# Patient Record
Sex: Female | Born: 1985 | Race: Black or African American | Hispanic: No | Marital: Married
Health system: Southern US, Academic
[De-identification: ages and names within clinical notes are randomized; demographics above are authoritative.]

## PROBLEM LIST (undated history)

## (undated) ENCOUNTER — Inpatient Hospital Stay (HOSPITAL_COMMUNITY): Payer: Self-pay

## (undated) ENCOUNTER — Encounter

## (undated) ENCOUNTER — Ambulatory Visit

## (undated) ENCOUNTER — Telehealth

## (undated) ENCOUNTER — Encounter: Attending: Adult Health | Primary: Adult Health

## (undated) ENCOUNTER — Encounter: Attending: Anesthesiology | Primary: Anesthesiology

## (undated) ENCOUNTER — Ambulatory Visit: Payer: BLUE CROSS/BLUE SHIELD

## (undated) ENCOUNTER — Telehealth: Attending: Neurology | Primary: Neurology

## (undated) ENCOUNTER — Telehealth: Attending: Maternal & Fetal Medicine | Primary: Maternal & Fetal Medicine

## (undated) ENCOUNTER — Encounter: Payer: BLUE CROSS/BLUE SHIELD | Attending: Obstetrics & Gynecology | Primary: Obstetrics & Gynecology

## (undated) ENCOUNTER — Ambulatory Visit: Payer: Medicaid (Managed Care)

## (undated) ENCOUNTER — Encounter: Attending: Maternal & Fetal Medicine | Primary: Maternal & Fetal Medicine

## (undated) ENCOUNTER — Encounter: Attending: Obstetrics & Gynecology | Primary: Obstetrics & Gynecology

## (undated) ENCOUNTER — Encounter: Attending: Medical | Primary: Medical

## (undated) ENCOUNTER — Telehealth: Attending: Clinical | Primary: Clinical

## (undated) ENCOUNTER — Telehealth: Attending: Obstetrics & Gynecology | Primary: Obstetrics & Gynecology

## (undated) ENCOUNTER — Encounter: Attending: Clinical | Primary: Clinical

## (undated) ENCOUNTER — Encounter: Payer: BLUE CROSS/BLUE SHIELD | Attending: Obstetrics | Primary: Obstetrics

## (undated) ENCOUNTER — Telehealth
Attending: Student in an Organized Health Care Education/Training Program | Primary: Student in an Organized Health Care Education/Training Program

## (undated) ENCOUNTER — Ambulatory Visit: Payer: BLUE CROSS/BLUE SHIELD | Attending: Obstetrics & Gynecology | Primary: Obstetrics & Gynecology

## (undated) ENCOUNTER — Inpatient Hospital Stay

## (undated) ENCOUNTER — Encounter
Payer: BLUE CROSS/BLUE SHIELD | Attending: Student in an Organized Health Care Education/Training Program | Primary: Student in an Organized Health Care Education/Training Program

## (undated) ENCOUNTER — Ambulatory Visit: Attending: Obstetrics & Gynecology | Primary: Obstetrics & Gynecology

## (undated) ENCOUNTER — Ambulatory Visit: Attending: Pharmacist | Primary: Pharmacist

## (undated) ENCOUNTER — Encounter: Payer: Medicaid (Managed Care) | Attending: Infectious Disease | Primary: Infectious Disease

## (undated) ENCOUNTER — Encounter: Attending: Infectious Disease | Primary: Infectious Disease

## (undated) ENCOUNTER — Encounter
Attending: Student in an Organized Health Care Education/Training Program | Primary: Student in an Organized Health Care Education/Training Program

## (undated) ENCOUNTER — Encounter: Attending: MS" | Primary: MS"

## (undated) DIAGNOSIS — A749 Chlamydial infection, unspecified: Secondary | ICD-10-CM

## (undated) DIAGNOSIS — G43909 Migraine, unspecified, not intractable, without status migrainosus: Secondary | ICD-10-CM

## (undated) DIAGNOSIS — H547 Unspecified visual loss: Secondary | ICD-10-CM

## (undated) DIAGNOSIS — R413 Other amnesia: Secondary | ICD-10-CM

## (undated) DIAGNOSIS — R599 Enlarged lymph nodes, unspecified: Secondary | ICD-10-CM

## (undated) DIAGNOSIS — G932 Benign intracranial hypertension: Secondary | ICD-10-CM

## (undated) DIAGNOSIS — C8590 Non-Hodgkin lymphoma, unspecified, unspecified site: Secondary | ICD-10-CM

## (undated) DIAGNOSIS — B20 Human immunodeficiency virus [HIV] disease: Secondary | ICD-10-CM

## (undated) DIAGNOSIS — Z21 Asymptomatic human immunodeficiency virus [HIV] infection status: Secondary | ICD-10-CM

## (undated) HISTORY — PX: NO PAST SURGERIES: SHX2092

## (undated) HISTORY — PX: DILATION AND CURETTAGE OF UTERUS: SHX78

## (undated) HISTORY — DX: Migraine, unspecified, not intractable, without status migrainosus: G43.909

## (undated) HISTORY — DX: Other amnesia: R41.3

## (undated) HISTORY — PX: OTHER SURGICAL HISTORY: SHX169

## (undated) HISTORY — PX: UPPER GI ENDOSCOPY: SHX6162

## (undated) HISTORY — PX: LUMBAR PUNCTURE: SHX1985

## (undated) HISTORY — PX: COLONOSCOPY: SHX174

## (undated) HISTORY — DX: Unspecified visual loss: H54.7

---
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
## 2011-01-28 ENCOUNTER — Emergency Department: Payer: Self-pay | Admitting: Emergency Medicine

## 2011-10-23 ENCOUNTER — Emergency Department: Payer: Self-pay | Admitting: Internal Medicine

## 2011-11-06 ENCOUNTER — Emergency Department: Payer: Self-pay | Admitting: Unknown Physician Specialty

## 2011-11-06 LAB — CBC
HCT: 35.7 % (ref 35.0–47.0)
HGB: 11.7 g/dL — ABNORMAL LOW (ref 12.0–16.0)
MCH: 27.5 pg (ref 26.0–34.0)
MCV: 84 fL (ref 80–100)
RBC: 4.27 10*6/uL (ref 3.80–5.20)

## 2011-11-06 LAB — HCG, QUANTITATIVE, PREGNANCY: Beta Hcg, Quant.: 1242 m[IU]/mL — ABNORMAL HIGH

## 2011-11-06 LAB — URINALYSIS, COMPLETE
Bilirubin,UR: NEGATIVE
Glucose,UR: NEGATIVE mg/dL (ref 0–75)
Nitrite: NEGATIVE
RBC,UR: 27 /HPF (ref 0–5)
Specific Gravity: 1.03 (ref 1.003–1.030)
Squamous Epithelial: 7
WBC UR: 37 /HPF (ref 0–5)

## 2011-11-06 LAB — WET PREP, GENITAL

## 2011-11-12 ENCOUNTER — Emergency Department: Payer: Self-pay | Admitting: Emergency Medicine

## 2011-11-12 LAB — URINALYSIS, COMPLETE
Bacteria: NONE SEEN
Bilirubin,UR: NEGATIVE
Glucose,UR: NEGATIVE mg/dL (ref 0–75)
Ketone: NEGATIVE
Leukocyte Esterase: NEGATIVE
Specific Gravity: 1.024 (ref 1.003–1.030)
Squamous Epithelial: 2
WBC UR: 1 /HPF (ref 0–5)

## 2011-11-12 LAB — HCG, QUANTITATIVE, PREGNANCY: Beta Hcg, Quant.: 769 m[IU]/mL — ABNORMAL HIGH

## 2011-11-13 ENCOUNTER — Emergency Department: Payer: Self-pay | Admitting: Emergency Medicine

## 2011-11-13 LAB — CBC
HGB: 12.1 g/dL (ref 12.0–16.0)
MCH: 27.4 pg (ref 26.0–34.0)
MCHC: 32.8 g/dL (ref 32.0–36.0)
Platelet: 306 10*3/uL (ref 150–440)
RBC: 4.43 10*6/uL (ref 3.80–5.20)
RDW: 15.3 % — ABNORMAL HIGH (ref 11.5–14.5)

## 2011-11-13 LAB — HCG, QUANTITATIVE, PREGNANCY: Beta Hcg, Quant.: 537 m[IU]/mL — ABNORMAL HIGH

## 2011-11-16 ENCOUNTER — Emergency Department: Payer: Self-pay | Admitting: Unknown Physician Specialty

## 2011-11-16 LAB — COMPREHENSIVE METABOLIC PANEL
Alkaline Phosphatase: 46 U/L — ABNORMAL LOW (ref 50–136)
Anion Gap: 9 (ref 7–16)
BUN: 10 mg/dL (ref 7–18)
Bilirubin,Total: 0.1 mg/dL — ABNORMAL LOW (ref 0.2–1.0)
Calcium, Total: 8.6 mg/dL (ref 8.5–10.1)
Co2: 28 mmol/L (ref 21–32)
EGFR (Non-African Amer.): >60
Glucose: 74 mg/dL (ref 65–99)
Osmolality: 285 (ref 275–301)
Potassium: 3.9 mmol/L (ref 3.5–5.1)
SGPT (ALT): 17 U/L
Sodium: 144 mmol/L (ref 136–145)
Total Protein: 7.2 g/dL (ref 6.4–8.2)

## 2011-11-16 LAB — CBC
HCT: 34.2 % — ABNORMAL LOW (ref 35.0–47.0)
HGB: 11.2 g/dL — ABNORMAL LOW (ref 12.0–16.0)
MCH: 27.4 pg (ref 26.0–34.0)
MCHC: 32.7 g/dL (ref 32.0–36.0)
MCV: 84 fL (ref 80–100)
RDW: 15 % — ABNORMAL HIGH (ref 11.5–14.5)

## 2011-11-16 LAB — URINALYSIS, COMPLETE
Bilirubin,UR: NEGATIVE
Glucose,UR: NEGATIVE mg/dL (ref 0–75)
Leukocyte Esterase: NEGATIVE
RBC,UR: 1 /HPF (ref 0–5)
Squamous Epithelial: 11
WBC UR: 3 /HPF (ref 0–5)

## 2011-11-25 ENCOUNTER — Emergency Department: Payer: Self-pay | Admitting: Emergency Medicine

## 2011-11-25 LAB — URINALYSIS, COMPLETE
Bacteria: NONE SEEN
Glucose,UR: NEGATIVE mg/dL (ref 0–75)
Leukocyte Esterase: NEGATIVE
Nitrite: NEGATIVE
Ph: 6 (ref 4.5–8.0)
Protein: NEGATIVE
RBC,UR: 2 /HPF (ref 0–5)
WBC UR: <1 /HPF (ref 0–5)

## 2011-11-25 LAB — COMPREHENSIVE METABOLIC PANEL
Albumin: 3.9 g/dL (ref 3.4–5.0)
Alkaline Phosphatase: 48 U/L — ABNORMAL LOW (ref 50–136)
Anion Gap: 8 (ref 7–16)
BUN: 17 mg/dL (ref 7–18)
Calcium, Total: 9 mg/dL (ref 8.5–10.1)
Chloride: 105 mmol/L (ref 98–107)
EGFR (Non-African Amer.): >60
Glucose: 87 mg/dL (ref 65–99)
Potassium: 4 mmol/L (ref 3.5–5.1)
SGOT(AST): 17 U/L (ref 15–37)
Sodium: 141 mmol/L (ref 136–145)
Total Protein: 8.3 g/dL — ABNORMAL HIGH (ref 6.4–8.2)

## 2011-11-25 LAB — CBC
HCT: 37.1 % (ref 35.0–47.0)
HGB: 12 g/dL (ref 12.0–16.0)
RDW: 15.5 % — ABNORMAL HIGH (ref 11.5–14.5)
WBC: 5.6 10*3/uL (ref 3.6–11.0)

## 2011-11-25 LAB — HCG, QUANTITATIVE, PREGNANCY: Beta Hcg, Quant.: 108 m[IU]/mL — ABNORMAL HIGH

## 2012-02-15 ENCOUNTER — Emergency Department: Payer: Self-pay | Admitting: Emergency Medicine

## 2012-09-02 ENCOUNTER — Emergency Department: Payer: Self-pay | Admitting: Emergency Medicine

## 2012-09-02 LAB — URINALYSIS, COMPLETE
Bilirubin,UR: NEGATIVE
Blood: NEGATIVE
Glucose,UR: NEGATIVE mg/dL (ref 0–75)
Ketone: NEGATIVE
Leukocyte Esterase: NEGATIVE
Ph: 8 (ref 4.5–8.0)
Protein: NEGATIVE
RBC,UR: 2 /HPF (ref 0–5)
Specific Gravity: 1.026 (ref 1.003–1.030)
Squamous Epithelial: 9

## 2012-09-02 LAB — WET PREP, GENITAL

## 2012-09-02 LAB — PREGNANCY, URINE: Pregnancy Test, Urine: NEGATIVE m[IU]/mL

## 2012-09-18 ENCOUNTER — Emergency Department: Payer: Self-pay | Admitting: Emergency Medicine

## 2012-09-19 LAB — CBC
HCT: 37.5 % (ref 35.0–47.0)
HGB: 13.2 g/dL (ref 12.0–16.0)
MCHC: 35.1 g/dL (ref 32.0–36.0)
RBC: 4.57 10*6/uL (ref 3.80–5.20)

## 2012-09-19 LAB — COMPREHENSIVE METABOLIC PANEL
Albumin: 4 g/dL (ref 3.4–5.0)
Anion Gap: 5 — ABNORMAL LOW (ref 7–16)
BUN: 11 mg/dL (ref 7–18)
Bilirubin,Total: 0.2 mg/dL (ref 0.2–1.0)
Calcium, Total: 9 mg/dL (ref 8.5–10.1)
Chloride: 109 mmol/L — ABNORMAL HIGH (ref 98–107)
Creatinine: 0.89 mg/dL (ref 0.60–1.30)
EGFR (African American): >60
Osmolality: 281 (ref 275–301)
Potassium: 4 mmol/L (ref 3.5–5.1)
SGOT(AST): 28 U/L (ref 15–37)
Sodium: 141 mmol/L (ref 136–145)
Total Protein: 8.1 g/dL (ref 6.4–8.2)

## 2012-09-19 LAB — URINALYSIS, COMPLETE
Blood: NEGATIVE
Glucose,UR: NEGATIVE mg/dL (ref 0–75)
Ketone: NEGATIVE
Protein: NEGATIVE
RBC,UR: 3 /HPF (ref 0–5)
Specific Gravity: 1.018 (ref 1.003–1.030)
Squamous Epithelial: 1

## 2012-09-19 LAB — WET PREP, GENITAL

## 2012-09-19 LAB — PREGNANCY, URINE: Pregnancy Test, Urine: NEGATIVE m[IU]/mL

## 2012-10-19 ENCOUNTER — Emergency Department: Payer: Self-pay | Admitting: Emergency Medicine

## 2012-10-19 LAB — URINALYSIS, COMPLETE
Bilirubin,UR: NEGATIVE
Blood: NEGATIVE
Ketone: NEGATIVE
Nitrite: NEGATIVE
Protein: NEGATIVE
Specific Gravity: 1.023 (ref 1.003–1.030)
Squamous Epithelial: 1

## 2012-10-19 LAB — COMPREHENSIVE METABOLIC PANEL
Alkaline Phosphatase: 78 U/L (ref 50–136)
Bilirubin,Total: 0.1 mg/dL — ABNORMAL LOW (ref 0.2–1.0)
Calcium, Total: 8.9 mg/dL (ref 8.5–10.1)
Chloride: 108 mmol/L — ABNORMAL HIGH (ref 98–107)
EGFR (African American): >60
Osmolality: 281 (ref 275–301)
Potassium: 3.7 mmol/L (ref 3.5–5.1)
SGOT(AST): 26 U/L (ref 15–37)
SGPT (ALT): 23 U/L (ref 12–78)
Sodium: 142 mmol/L (ref 136–145)
Total Protein: 8.7 g/dL — ABNORMAL HIGH (ref 6.4–8.2)

## 2012-10-19 LAB — CBC
MCH: 25.4 pg — ABNORMAL LOW (ref 26.0–34.0)
MCV: 83 fL (ref 80–100)
Platelet: 271 10*3/uL (ref 150–440)
WBC: 6.1 10*3/uL (ref 3.6–11.0)

## 2012-10-19 LAB — WET PREP, GENITAL

## 2012-10-19 LAB — PREGNANCY, URINE: Pregnancy Test, Urine: NEGATIVE m[IU]/mL

## 2012-10-24 ENCOUNTER — Emergency Department: Payer: Self-pay | Admitting: Emergency Medicine

## 2012-10-24 LAB — URINALYSIS, COMPLETE
Blood: NEGATIVE
Glucose,UR: NEGATIVE mg/dL (ref 0–75)
Ketone: NEGATIVE
Leukocyte Esterase: NEGATIVE
Nitrite: NEGATIVE
Protein: NEGATIVE
RBC,UR: 1 /HPF (ref 0–5)

## 2012-10-24 LAB — COMPREHENSIVE METABOLIC PANEL
Albumin: 4.1 g/dL (ref 3.4–5.0)
Calcium, Total: 9.3 mg/dL (ref 8.5–10.1)
Co2: 26 mmol/L (ref 21–32)
EGFR (African American): >60
Glucose: 87 mg/dL (ref 65–99)
Osmolality: 282 (ref 275–301)
Sodium: 142 mmol/L (ref 136–145)

## 2012-10-24 LAB — CBC
HCT: 40.1 % (ref 35.0–47.0)
HGB: 13.2 g/dL (ref 12.0–16.0)
MCH: 27.2 pg (ref 26.0–34.0)
MCHC: 32.9 g/dL (ref 32.0–36.0)
MCV: 83 fL (ref 80–100)
RDW: 14.6 % — ABNORMAL HIGH (ref 11.5–14.5)
WBC: 6.6 10*3/uL (ref 3.6–11.0)

## 2012-10-24 LAB — ACETAMINOPHEN LEVEL: Acetaminophen: 16 ug/mL

## 2012-10-24 LAB — SALICYLATE LEVEL: Salicylates, Serum: 16 mg/dL — ABNORMAL HIGH

## 2012-10-31 ENCOUNTER — Telehealth: Payer: Self-pay

## 2012-10-31 NOTE — Telephone Encounter (Signed)
Pt was referred by [facility].  I attempted to speak with her via phone on 10-15-12 and she refused my call at the time saying she would call me back later. She never returned call.  Today I called and she answered the phone.  She is stating the test performed through [facility] are falsely positive. She states she went to [facility] and re tested for HIV and was told her test was negative.    [facility] has tested this patient twice and each time her results were positive, she states she was taking heavy antibiotics at the time and her "mothers doctor" told her she should not have been tested while taking these medications.  I explained the two test were confirmed and she is HIV positive and for this reason a referral was made to Infectious Disease physicians.  She states she only wants a Viral load test . I explained she would need to come for a visit with me which would include labs and she would also need to qualify for financial services . She repeats she only wants a viral load test.    Pt was informed I would contact [facility]  [place] and share this information.    I spoke with Blima Singer and explained the patient is not willing to come for intake and thinks the test done by [facility] are falsely positive.  At this point she may need a Paramedic referral. It seems she may be in denial.  We can use our [facility] or I can make a referral to [facility] [employer].

## 2012-11-16 ENCOUNTER — Telehealth: Payer: Self-pay

## 2012-11-16 NOTE — Telephone Encounter (Signed)
Amber Key, [facility] Bridge Counselor contacted patient. She is willing to come for appointment.  Pt was scheduled and advised to call t reschedule if needed.    Laurell Josephs, RN

## 2012-11-27 ENCOUNTER — Ambulatory Visit: Payer: Self-pay

## 2012-12-11 ENCOUNTER — Emergency Department: Payer: Self-pay | Admitting: Emergency Medicine

## 2012-12-11 LAB — CBC
HCT: 40.7 % (ref 35.0–47.0)
HGB: 13.4 g/dL (ref 12.0–16.0)
MCH: 26.7 pg (ref 26.0–34.0)
MCV: 81 fL (ref 80–100)
RBC: 5 10*6/uL (ref 3.80–5.20)
WBC: 7.4 10*3/uL (ref 3.6–11.0)

## 2012-12-11 LAB — COMPREHENSIVE METABOLIC PANEL
Albumin: 4 g/dL (ref 3.4–5.0)
Alkaline Phosphatase: 78 U/L (ref 50–136)
Bilirubin,Total: 0.3 mg/dL (ref 0.2–1.0)
Calcium, Total: 8.7 mg/dL (ref 8.5–10.1)
Chloride: 105 mmol/L (ref 98–107)
Creatinine: 0.64 mg/dL (ref 0.60–1.30)
EGFR (African American): >60
Glucose: 88 mg/dL (ref 65–99)
Osmolality: 275 (ref 275–301)
Potassium: 3.8 mmol/L (ref 3.5–5.1)
SGPT (ALT): 30 U/L (ref 12–78)
Sodium: 138 mmol/L (ref 136–145)

## 2012-12-11 LAB — URINALYSIS, COMPLETE
Bilirubin,UR: NEGATIVE
Ketone: NEGATIVE
Protein: NEGATIVE
WBC UR: 3 /HPF (ref 0–5)

## 2013-02-20 ENCOUNTER — Encounter: Payer: Self-pay | Admitting: *Deleted

## 2013-02-20 NOTE — Progress Notes (Signed)
Patient information given to [facility] Case Managers to assist with getting patient into care.  

## 2013-05-21 ENCOUNTER — Emergency Department: Payer: Self-pay | Admitting: Emergency Medicine

## 2013-05-26 ENCOUNTER — Emergency Department: Payer: Self-pay | Admitting: Emergency Medicine

## 2013-07-17 ENCOUNTER — Emergency Department: Payer: Self-pay | Admitting: Internal Medicine

## 2013-07-19 LAB — BETA STREP CULTURE(ARMC)

## 2013-07-22 ENCOUNTER — Emergency Department: Payer: Self-pay | Admitting: Emergency Medicine

## 2013-07-22 LAB — MONONUCLEOSIS SCREEN: Mono Test: NEGATIVE

## 2013-09-09 ENCOUNTER — Emergency Department: Payer: Self-pay | Admitting: Emergency Medicine

## 2013-09-09 LAB — CBC WITH DIFFERENTIAL/PLATELET
Eosinophil #: 0.2 10*3/uL (ref 0.0–0.7)
HCT: 36.1 % (ref 35.0–47.0)
HGB: 11.9 g/dL — ABNORMAL LOW (ref 12.0–16.0)
Lymphocyte #: 1.6 10*3/uL (ref 1.0–3.6)
MCH: 27 pg (ref 26.0–34.0)
Monocyte #: 0.3 x10 3/mm (ref 0.2–0.9)
Neutrophil #: 1.4 10*3/uL (ref 1.4–6.5)
RBC: 4.42 10*6/uL (ref 3.80–5.20)
RDW: 14.6 % — ABNORMAL HIGH (ref 11.5–14.5)
WBC: 3.5 10*3/uL — ABNORMAL LOW (ref 3.6–11.0)

## 2013-09-09 LAB — COMPREHENSIVE METABOLIC PANEL
Alkaline Phosphatase: 64 U/L
BUN: 10 mg/dL (ref 7–18)
Calcium, Total: 8.9 mg/dL (ref 8.5–10.1)
Chloride: 107 mmol/L (ref 98–107)
EGFR (African American): >60
SGPT (ALT): 24 U/L (ref 12–78)
Sodium: 138 mmol/L (ref 136–145)
Total Protein: 8.5 g/dL — ABNORMAL HIGH (ref 6.4–8.2)

## 2013-09-11 ENCOUNTER — Emergency Department: Payer: Self-pay | Admitting: Emergency Medicine

## 2013-09-11 LAB — COMPREHENSIVE METABOLIC PANEL
Albumin: 3.8 g/dL (ref 3.4–5.0)
Alkaline Phosphatase: 65 U/L
Anion Gap: 8 (ref 7–16)
BUN: 11 mg/dL (ref 7–18)
Calcium, Total: 9.3 mg/dL (ref 8.5–10.1)
Co2: 26 mmol/L (ref 21–32)
Creatinine: 0.74 mg/dL (ref 0.60–1.30)
EGFR (Non-African Amer.): >60
Osmolality: 276 (ref 275–301)
Potassium: 3.6 mmol/L (ref 3.5–5.1)
SGPT (ALT): 26 U/L (ref 12–78)
Total Protein: 8.6 g/dL — ABNORMAL HIGH (ref 6.4–8.2)

## 2013-09-11 LAB — URINALYSIS, COMPLETE
Bacteria: NONE SEEN
Bilirubin,UR: NEGATIVE
Glucose,UR: NEGATIVE mg/dL (ref 0–75)
Leukocyte Esterase: NEGATIVE
Protein: NEGATIVE
RBC,UR: 2 /HPF (ref 0–5)
Specific Gravity: 1.02 (ref 1.003–1.030)
Squamous Epithelial: 2

## 2013-09-11 LAB — CBC WITH DIFFERENTIAL/PLATELET
Basophil #: 0 10*3/uL (ref 0.0–0.1)
Eosinophil #: 0.1 10*3/uL (ref 0.0–0.7)
HCT: 35.9 % (ref 35.0–47.0)
HGB: 11.9 g/dL — ABNORMAL LOW (ref 12.0–16.0)
Lymphocyte #: 1.8 10*3/uL (ref 1.0–3.6)
Lymphocyte %: 36.3 %
MCH: 27.3 pg (ref 26.0–34.0)
MCV: 82 fL (ref 80–100)
Monocyte #: 0.6 x10 3/mm (ref 0.2–0.9)
Neutrophil #: 2.5 10*3/uL (ref 1.4–6.5)
Platelet: 236 10*3/uL (ref 150–440)
RBC: 4.38 10*6/uL (ref 3.80–5.20)
RDW: 14.7 % — ABNORMAL HIGH (ref 11.5–14.5)
WBC: 5.1 10*3/uL (ref 3.6–11.0)

## 2013-11-16 ENCOUNTER — Emergency Department: Payer: Self-pay | Admitting: Emergency Medicine

## 2013-12-13 DIAGNOSIS — G932 Benign intracranial hypertension: Secondary | ICD-10-CM | POA: Insufficient documentation

## 2013-12-13 DIAGNOSIS — G43909 Migraine, unspecified, not intractable, without status migrainosus: Secondary | ICD-10-CM | POA: Insufficient documentation

## 2014-02-09 ENCOUNTER — Emergency Department: Payer: Self-pay | Admitting: Emergency Medicine

## 2014-02-09 LAB — CBC
HCT: 37.9 % (ref 35.0–47.0)
HGB: 12.4 g/dL (ref 12.0–16.0)
MCH: 27.3 pg (ref 26.0–34.0)
MCHC: 32.6 g/dL (ref 32.0–36.0)
MCV: 84 fL (ref 80–100)
Platelet: 224 10*3/uL (ref 150–440)
RBC: 4.53 10*6/uL (ref 3.80–5.20)
RDW: 15.3 % — AB (ref 11.5–14.5)
WBC: 5.3 10*3/uL (ref 3.6–11.0)

## 2014-02-09 LAB — BASIC METABOLIC PANEL
Anion Gap: 9 (ref 7–16)
BUN: 12 mg/dL (ref 7–18)
CO2: 18 mmol/L — AB (ref 21–32)
Calcium, Total: 8.8 mg/dL (ref 8.5–10.1)
Chloride: 116 mmol/L — ABNORMAL HIGH (ref 98–107)
Creatinine: 0.88 mg/dL (ref 0.60–1.30)
EGFR (Non-African Amer.): >60
GLUCOSE: 74 mg/dL (ref 65–99)
Osmolality: 283 (ref 275–301)
Potassium: 3.7 mmol/L (ref 3.5–5.1)
Sodium: 143 mmol/L (ref 136–145)

## 2014-02-10 ENCOUNTER — Ambulatory Visit: Payer: Self-pay | Admitting: Oncology

## 2014-02-10 DIAGNOSIS — B20 Human immunodeficiency virus [HIV] disease: Secondary | ICD-10-CM | POA: Insufficient documentation

## 2014-02-10 DIAGNOSIS — Z21 Asymptomatic human immunodeficiency virus [HIV] infection status: Secondary | ICD-10-CM | POA: Insufficient documentation

## 2014-02-10 LAB — CBC CANCER CENTER
Basophil #: 0 x10 3/mm (ref 0.0–0.1)
Basophil %: 0.6 %
EOS PCT: 9.2 %
Eosinophil #: 0.4 x10 3/mm (ref 0.0–0.7)
HCT: 35.7 % (ref 35.0–47.0)
HGB: 11.7 g/dL — AB (ref 12.0–16.0)
Lymphocyte #: 1.9 x10 3/mm (ref 1.0–3.6)
Lymphocyte %: 38.1 %
MCH: 26.9 pg (ref 26.0–34.0)
MCHC: 32.7 g/dL (ref 32.0–36.0)
MCV: 82 fL (ref 80–100)
MONO ABS: 0.5 x10 3/mm (ref 0.2–0.9)
Monocyte %: 10.5 %
NEUTROS ABS: 2 x10 3/mm (ref 1.4–6.5)
NEUTROS PCT: 41.6 %
PLATELETS: 233 x10 3/mm (ref 150–440)
RBC: 4.34 10*6/uL (ref 3.80–5.20)
RDW: 15.3 % — AB (ref 11.5–14.5)
WBC: 4.9 x10 3/mm (ref 3.6–11.0)

## 2014-02-10 LAB — LACTATE DEHYDROGENASE: LDH: 162 U/L (ref 81–246)

## 2014-02-11 DIAGNOSIS — R599 Enlarged lymph nodes, unspecified: Secondary | ICD-10-CM | POA: Insufficient documentation

## 2014-02-11 DIAGNOSIS — R591 Generalized enlarged lymph nodes: Secondary | ICD-10-CM | POA: Insufficient documentation

## 2014-03-10 ENCOUNTER — Ambulatory Visit: Payer: Self-pay | Admitting: Oncology

## 2014-04-27 ENCOUNTER — Emergency Department: Payer: Self-pay | Admitting: Internal Medicine

## 2014-06-19 ENCOUNTER — Emergency Department: Payer: Self-pay | Admitting: Emergency Medicine

## 2014-06-19 LAB — COMPREHENSIVE METABOLIC PANEL
ALK PHOS: 69 U/L
ANION GAP: 6 — AB (ref 7–16)
AST: 25 U/L (ref 15–37)
Albumin: 3.7 g/dL (ref 3.4–5.0)
BILIRUBIN TOTAL: 0.2 mg/dL (ref 0.2–1.0)
BUN: 12 mg/dL (ref 7–18)
CALCIUM: 9.2 mg/dL (ref 8.5–10.1)
CREATININE: 0.83 mg/dL (ref 0.60–1.30)
Chloride: 107 mmol/L (ref 98–107)
Co2: 26 mmol/L (ref 21–32)
EGFR (African American): >60
EGFR (Non-African Amer.): >60
Glucose: 90 mg/dL (ref 65–99)
Osmolality: 277 (ref 275–301)
Potassium: 3.7 mmol/L (ref 3.5–5.1)
SGPT (ALT): 18 U/L
Sodium: 139 mmol/L (ref 136–145)
Total Protein: 8.4 g/dL — ABNORMAL HIGH (ref 6.4–8.2)

## 2014-06-19 LAB — URINALYSIS, COMPLETE
BACTERIA: NONE SEEN
BILIRUBIN, UR: NEGATIVE
BLOOD: NEGATIVE
Glucose,UR: NEGATIVE mg/dL (ref 0–75)
Ketone: NEGATIVE
LEUKOCYTE ESTERASE: NEGATIVE
Nitrite: NEGATIVE
PROTEIN: NEGATIVE
Ph: 5 (ref 4.5–8.0)
RBC, UR: NONE SEEN /HPF (ref 0–5)
Specific Gravity: 1.028 (ref 1.003–1.030)
Squamous Epithelial: 8

## 2014-06-19 LAB — CBC WITH DIFFERENTIAL/PLATELET
BASOS ABS: 0 10*3/uL (ref 0.0–0.1)
Basophil %: 0.4 %
Eosinophil #: 0.1 10*3/uL (ref 0.0–0.7)
Eosinophil %: 2 %
HCT: 40.4 % (ref 35.0–47.0)
HGB: 13.1 g/dL (ref 12.0–16.0)
LYMPHS ABS: 2.2 10*3/uL (ref 1.0–3.6)
Lymphocyte %: 39.3 %
MCH: 28.2 pg (ref 26.0–34.0)
MCHC: 32.5 g/dL (ref 32.0–36.0)
MCV: 87 fL (ref 80–100)
MONO ABS: 0.4 x10 3/mm (ref 0.2–0.9)
Monocyte %: 7.7 %
NEUTROS ABS: 2.8 10*3/uL (ref 1.4–6.5)
Neutrophil %: 50.6 %
PLATELETS: 258 10*3/uL (ref 150–440)
RBC: 4.65 10*6/uL (ref 3.80–5.20)
RDW: 16 % — ABNORMAL HIGH (ref 11.5–14.5)
WBC: 5.5 10*3/uL (ref 3.6–11.0)

## 2014-06-26 DIAGNOSIS — R928 Other abnormal and inconclusive findings on diagnostic imaging of breast: Secondary | ICD-10-CM | POA: Insufficient documentation

## 2014-07-10 ENCOUNTER — Emergency Department: Payer: Self-pay | Admitting: Emergency Medicine

## 2014-07-10 LAB — CBC
HCT: 36 % (ref 35.0–47.0)
HGB: 11.7 g/dL — ABNORMAL LOW (ref 12.0–16.0)
MCH: 28.1 pg (ref 26.0–34.0)
MCHC: 32.5 g/dL (ref 32.0–36.0)
MCV: 86 fL (ref 80–100)
PLATELETS: 253 10*3/uL (ref 150–440)
RBC: 4.17 10*6/uL (ref 3.80–5.20)
RDW: 15.7 % — AB (ref 11.5–14.5)
WBC: 8.1 10*3/uL (ref 3.6–11.0)

## 2014-07-10 LAB — COMPREHENSIVE METABOLIC PANEL
ALT: 22 U/L
Albumin: 4 g/dL (ref 3.4–5.0)
Alkaline Phosphatase: 70 U/L
Anion Gap: 9 (ref 7–16)
BUN: 11 mg/dL (ref 7–18)
Bilirubin,Total: 0.2 mg/dL (ref 0.2–1.0)
Calcium, Total: 8.8 mg/dL (ref 8.5–10.1)
Chloride: 108 mmol/L — ABNORMAL HIGH (ref 98–107)
Co2: 25 mmol/L (ref 21–32)
Creatinine: 0.99 mg/dL (ref 0.60–1.30)
EGFR (African American): >60
Glucose: 96 mg/dL (ref 65–99)
OSMOLALITY: 282 (ref 275–301)
Potassium: 3.2 mmol/L — ABNORMAL LOW (ref 3.5–5.1)
SGOT(AST): 19 U/L (ref 15–37)
SODIUM: 142 mmol/L (ref 136–145)
Total Protein: 8.2 g/dL (ref 6.4–8.2)

## 2014-07-10 LAB — LIPASE, BLOOD
LIPASE: 115 U/L (ref 73–393)
LIPASE: 103 U/L (ref 73–393)

## 2014-07-10 LAB — URINALYSIS, COMPLETE
Bilirubin,UR: NEGATIVE
Blood: NEGATIVE
Glucose,UR: NEGATIVE mg/dL (ref 0–75)
Leukocyte Esterase: NEGATIVE
NITRITE: NEGATIVE
PH: 5 (ref 4.5–8.0)
PROTEIN: NEGATIVE
RBC,UR: 1 /HPF (ref 0–5)
Specific Gravity: 1.035 (ref 1.003–1.030)
Squamous Epithelial: 6
WBC UR: 1 /HPF (ref 0–5)

## 2014-07-10 LAB — DRUG SCREEN, URINE
AMPHETAMINES, UR SCREEN: NEGATIVE (ref ?–1000)
BENZODIAZEPINE, UR SCRN: NEGATIVE (ref ?–200)
Barbiturates, Ur Screen: NEGATIVE (ref ?–200)
Cannabinoid 50 Ng, Ur ~~LOC~~: POSITIVE (ref ?–50)
Cocaine Metabolite,Ur ~~LOC~~: NEGATIVE (ref ?–300)
MDMA (Ecstasy)Ur Screen: NEGATIVE (ref ?–500)
METHADONE, UR SCREEN: NEGATIVE (ref ?–300)
Opiate, Ur Screen: NEGATIVE (ref ?–300)
PHENCYCLIDINE (PCP) UR S: NEGATIVE (ref ?–25)
Tricyclic, Ur Screen: NEGATIVE (ref ?–1000)

## 2014-07-10 LAB — PREGNANCY, URINE: Pregnancy Test, Urine: NEGATIVE m[IU]/mL

## 2014-07-10 LAB — ACETAMINOPHEN LEVEL
ACETAMINOPHEN: 12 ug/mL
ACETAMINOPHEN: 29 ug/mL
Acetaminophen: 16 ug/mL

## 2014-07-10 LAB — ETHANOL: Ethanol: <3 mg/dL

## 2014-07-10 LAB — SALICYLATE LEVEL
SALICYLATES, SERUM: 1.8 mg/dL
Salicylates, Serum: <1.7 mg/dL

## 2014-07-16 ENCOUNTER — Emergency Department: Payer: Self-pay | Admitting: Student

## 2014-07-22 ENCOUNTER — Ambulatory Visit: Payer: Self-pay | Admitting: Family Medicine

## 2014-07-24 ENCOUNTER — Encounter: Payer: Self-pay | Admitting: Podiatry

## 2014-07-24 ENCOUNTER — Ambulatory Visit (INDEPENDENT_AMBULATORY_CARE_PROVIDER_SITE_OTHER): Payer: Medicaid Other | Admitting: Podiatry

## 2014-07-24 ENCOUNTER — Ambulatory Visit (INDEPENDENT_AMBULATORY_CARE_PROVIDER_SITE_OTHER): Payer: Medicaid Other

## 2014-07-24 VITALS — BP 104/72 | HR 83 | Resp 16 | Ht 59.0 in | Wt 171.0 lb

## 2014-07-24 DIAGNOSIS — M7750 Other enthesopathy of unspecified foot: Secondary | ICD-10-CM

## 2014-07-24 DIAGNOSIS — M779 Enthesopathy, unspecified: Secondary | ICD-10-CM

## 2014-07-24 DIAGNOSIS — M792 Neuralgia and neuritis, unspecified: Secondary | ICD-10-CM

## 2014-07-24 DIAGNOSIS — S9031XA Contusion of right foot, initial encounter: Secondary | ICD-10-CM

## 2014-07-24 MED ORDER — DICLOFENAC SODIUM 75 MG PO TBEC: 75.0000 mg | DELAYED_RELEASE_TABLET | Freq: Two times a day (BID) | ORAL | Status: DC

## 2014-07-24 NOTE — Progress Notes (Signed)
   Subjective:    Patient ID: Amber Key, female    DOB: 02/08/1986, 28 y.o.   MRN: [id number]  HPI Comments: Amber Key, 28 year old female, presents the office they with complaints of right foot and ankle pain. She states that she fell down a set of steps last week for which she hurt her ankle. She states that she went to the emergency room and was told she had a sprain. She's been applying a brace to her ankle. She is also taking Motrin and tramadol for pain. She is also been applying ice to the area. She states that she is having pain to the front of her ankle and top of her foot. She states it feels like an electric shock at times. The pain is intermittent. No other complaints at this time.   Foot Pain Associated symptoms include fatigue and headaches.      Review of Systems  Constitutional: Positive for fatigue.       Sweating   HENT: Positive for ear pain.   Musculoskeletal: Positive for back pain.  Neurological: Positive for headaches.  All other systems reviewed and are negative.      Objective:   Physical Exam AAO x3, NAD DP/PT pulses palpable bilaterally, CRT less than 3 seconds Protective sensation intact with Simms Weinstein monofilament, vibratory sensation intact, Achilles tendon reflex intact. Mild edema over the dorsal lateral aspect of the right foot. No overlying erythema or increased warmth. There is no pinpoint bony tenderness or pain with vibratory sensation. No pain along the course of the fibula, tibia, syndesmosis. No tenderness over the course of the lateral or medial ankle ligaments were syndesmosis. Ankle joint range of motion within normal limits and without pain.  No tenderness in the foot including fifth metatarsal base, navicular, foot. Reproduction of symptoms upon palpation of the superficial peroneal nerve. MMT 5/5, ROM WNL No open lesions No calf pain, swelling, warmth.       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 -X-rays were obtained and reviewed with the patient. -Various treatment options discussed including alternatives, risks, complications. -Continue with ankle brace at this time. -Ice to the effected area. -Prescribed Voltaren twice a day. Patient has since discontinued ibuprofen 800 mg. Discussed with her side effects the medication and directed to stop immediately if any are to occur. -Followup in 2 weeks to recheck symptoms or sooner if any problems are to arise. In the meantime call the office with any questions, concerns. Followup with PCP for other issues mentioned in the review of systems

## 2014-08-07 ENCOUNTER — Ambulatory Visit: Payer: Medicaid Other | Admitting: Podiatry

## 2015-01-27 ENCOUNTER — Emergency Department
Admit: 2015-01-27 | Disposition: A | Discharge status: Home / Self Care | Payer: Self-pay | Admitting: Emergency Medicine

## 2015-01-27 LAB — CBC WITH DIFFERENTIAL/PLATELET
BASOS PCT: 0.5 %
Basophil #: 0 10*3/uL (ref 0.0–0.1)
EOS ABS: 0.1 10*3/uL (ref 0.0–0.7)
EOS PCT: 1.9 %
HCT: 41.2 % (ref 35.0–47.0)
HGB: 13.3 g/dL (ref 12.0–16.0)
LYMPHS ABS: 2.1 10*3/uL (ref 1.0–3.6)
Lymphocyte %: 44.1 %
MCH: 29.2 pg (ref 26.0–34.0)
MCHC: 32.3 g/dL (ref 32.0–36.0)
MCV: 90 fL (ref 80–100)
MONO ABS: 0.3 x10 3/mm (ref 0.2–0.9)
Monocyte %: 6.3 %
Neutrophil #: 2.3 10*3/uL (ref 1.4–6.5)
Neutrophil %: 47.2 %
Platelet: 197 10*3/uL (ref 150–440)
RBC: 4.56 10*6/uL (ref 3.80–5.20)
RDW: 15.2 % — ABNORMAL HIGH (ref 11.5–14.5)
WBC: 4.9 10*3/uL (ref 3.6–11.0)

## 2015-01-27 LAB — COMPREHENSIVE METABOLIC PANEL
ALK PHOS: 77 U/L
ALT: 17 U/L
AST: 25 U/L
Albumin: 4.6 g/dL
Anion Gap: 5 — ABNORMAL LOW (ref 7–16)
BILIRUBIN TOTAL: 0.3 mg/dL
BUN: 10 mg/dL
CHLORIDE: 108 mmol/L
CREATININE: 1.01 mg/dL — AB
Calcium, Total: 8.5 mg/dL — ABNORMAL LOW
Co2: 26 mmol/L
EGFR (African American): >60
EGFR (Non-African Amer.): >60
Glucose: 89 mg/dL
Potassium: 3.6 mmol/L
SODIUM: 139 mmol/L
TOTAL PROTEIN: 8.1 g/dL

## 2015-01-27 LAB — URINALYSIS, COMPLETE
BACTERIA: NONE SEEN
BLOOD: NEGATIVE
Bilirubin,UR: NEGATIVE
GLUCOSE, UR: NEGATIVE mg/dL (ref 0–75)
KETONE: NEGATIVE
LEUKOCYTE ESTERASE: NEGATIVE
Nitrite: NEGATIVE
Ph: 6 (ref 4.5–8.0)
Protein: NEGATIVE
Specific Gravity: 1.013 (ref 1.003–1.030)

## 2015-01-27 LAB — PREGNANCY, URINE: Pregnancy Test, Urine: NEGATIVE m[IU]/mL

## 2015-01-31 NOTE — Consult Note (Signed)
PATIENT NAME:  Amber Key, Amber Key MR#:  [id number] DATE OF BIRTH:  01-09-1986  DATE OF CONSULTATION:  07/10/2014  REFERRING PHYSICIAN:   CONSULTING PHYSICIAN:  Gonzella Lex, MD  IDENTIFYING INFORMATION AND REASON FOR CONSULT: This is a 29 year old woman with multiple medical problems, who presented to the hospital after taking an excessive amount of Tylenol.   CHIEF COMPLAINT: "I was not trying to kill myself."   HISTORY OF PRESENT ILLNESS: Information obtained from the patient and the chart. The patient states that she has chronic pain, which has been going on for several months. She is being worked up at [facility] by a gastroenterologist, also a neurologist, also her ID doctor. So far, they have not found any cause. The pain is constant and sharp. She was feeling overwhelmed by it yesterday and took Tylenol. She said she took 5 of them at a time on 3 separate occasions, and then started feeling even more sick to her stomach. Told her fiance, who brought her into the hospital. The patient denies that she stays down or depressed consistently. Says that her medical condition has otherwise been stable. Her appetite is influenced by her abdominal pain. Sleep is adequate. Denies any suicidal ideation. Denies any psychotic symptoms. She tells me she did not know that the Tylenol was a potentially dangerous medicine. She says that she told the nurse when she came in that she took the medicine to make the pain stop, not to make herself stop or to go away.   PAST PSYCHIATRIC HISTORY: Sees a therapist at [facility] through the infectious disease clinic. No history of psychiatric hospitalization. No history of psychiatric medicine. No past suicide attempts.   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 Chronic abdominal pain of unknown etiology.   FAMILY HISTORY: Negative for depression.   SUBSTANCE ABUSE HISTORY: Does not drink, does not abuse drugs. Says she has no history of regular drug or  alcohol abuse. Used to use medical marijuana when she lived in [place], but does not do that anymore.   SOCIAL HISTORY: Lives with her fiance. Has an 42-year-old daughter who is currently visiting a family member in [place], but is scheduled to come home this weekend. Has a good relationship with her fiance and her 3 stepchildren.   CURRENT MEDICATIONS: Based on reconciliation, she takes Imitrex p.r.n. for headache, propranolol 20 mg twice a day, ondansetron 4 mg as needed for nausea, Diamox 500 mg 2 capsules twice a day, Carafate 1 gram 4 times a day. Also takes (Dictation Anomaly) <<medicationsMISSING TEXT>> for the HIV.    ALLERGIES: PEANUTS.   REVIEW OF SYSTEMS: Chronic pain in the abdomen. Mild nausea. Denies feeling depressed, denies suicidal or homicidal ideation. Denies hallucinations. The rest of the full 9 category review of systems is negative.   MENTAL STATUS EXAMINATION: Alert, oriented, and cooperative. Good eye contact. Normal psychomotor activity. Speech normal rate, tone and volume. Affect tearful at first, but reactive and appropriate. Calmed down and became less upset the more we talked. Mood stated as okay. Thoughts are lucid. No evidence of delusions. Denies auditory or visual hallucinations. Denies suicidal or homicidal ideation. Shows good insight and judgment. Alert and oriented x 4. Remembers 3/3 objects immediately and at 3 minutes. Normal intelligence. Normal fund of knowledge.   LABORATORY RESULTS: Most importantly the acetaminophen level never went higher than 29, and then came down, so she never got anywhere near a toxic level that would require treatment.  Lipase normal. Alcohol level negative. Chemistry panel: Only slightly low potassium. Does have a positive screen for marijuana. CBC normal white count, hemoglobin slightly low at 11.7. Urinalysis unremarkable.   VITAL SIGNS: Blood pressure was 104/63, respirations 18, pulse 92, temperature 98.3. The patient appears  to be in no physical distress.   ASSESSMENT: A 29 year old woman took an overdose of Tylenol by accident. Denies that she had any intention of harming herself. Denies having major depression. Denies psychotic symptoms. Gives a good account of positive things in her life and reasons to live. Currently getting seen for severe medical problems, but has been compliant with treatment.   At this point, I do not think that she is likely to be acutely dangerous to herself. She seems to be motivated for outpatient treatment. The patient was educated about the dangers of acetaminophen overdose. Strongly encouraged to follow up with the therapist she has seen through the infectious disease clinic. No longer meets commitment criteria and can be taken off of commitment. The case discussed with Dr. Edd Fabian in the Emergency Room. The patient can be discharged home.   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   SECONDARY DIAGNOSES:  AXIS I: No further.  AXIS II: No diagnosis.   ____________________________ Gonzella Lex, MD jtc:JT D: 07/10/2014 14:47:17 ET T: 07/10/2014 15:05:15 ET JOB#: [id number]  cc: Gonzella Lex, MD, <Dictator> Gonzella Lex MD ELECTRONICALLY SIGNED 07/17/2014 16:07

## 2015-03-19 NOTE — Unmapped (Signed)
Participant met with [employer] [phone]) study coordinator to complete 48 week CASI (survey) assessment, study evaluation and viral load.    PI - Sonia Napravnik  Coordinator - Aleen Sells

## 2015-04-19 ENCOUNTER — Encounter: Payer: Self-pay | Admitting: Emergency Medicine

## 2015-04-19 ENCOUNTER — Emergency Department: Payer: Medicaid Other

## 2015-04-19 ENCOUNTER — Emergency Department
Admission: EM | Admit: 2015-04-19 | Discharge: 2015-04-19 | Disposition: A | Discharge status: Home / Self Care | Payer: Medicaid Other | Attending: Emergency Medicine | Admitting: Emergency Medicine

## 2015-04-19 DIAGNOSIS — Z79899 Other long term (current) drug therapy: Secondary | ICD-10-CM | POA: Diagnosis not present

## 2015-04-19 DIAGNOSIS — K5900 Constipation, unspecified: Secondary | ICD-10-CM | POA: Diagnosis not present

## 2015-04-19 DIAGNOSIS — Z791 Long term (current) use of non-steroidal anti-inflammatories (NSAID): Secondary | ICD-10-CM | POA: Diagnosis not present

## 2015-04-19 HISTORY — DX: Benign intracranial hypertension: G93.2

## 2015-04-19 HISTORY — DX: Enlarged lymph nodes, unspecified: R59.9

## 2015-04-19 MED ORDER — BISACODYL 10 MG RE SUPP: 10.0000 mg | RECTAL | Status: DC | PRN

## 2015-04-19 NOTE — Discharge Instructions (Signed)

## 2015-04-19 NOTE — ED Provider Notes (Signed)
[facility] Emergency Department Provider Note  ____________________________________________  Time seen: 2 PM  I have reviewed the triage vital signs and the nursing notes.   HISTORY  Chief Complaint Constipation    HPI Amber Key is a 29 y.o. female with constipation. She reports she hasn't had proper bowel movement in 10 days although she was able to have a small amount of stool 2 days ago. She has tried over-the-counter laxatives with little success. She denies nausea vomiting. She has mild bloating but no abdominal pain. No fevers no chills.      Past Medical History  Diagnosis Date  . Enlarged lymph nodes unk  . Pseudotumor cerebri unk    There are no active problems to display for this patient.   History reviewed. No pertinent past surgical history.  Current Outpatient Rx  Name  Route  Sig  Dispense  Refill  . desipramine (NORPRAMIN) 50 MG tablet   Oral   Take 50 mg by mouth daily.         . naproxen (NAPROSYN) 500 MG tablet   Oral   Take 500 mg by mouth 2 (two) times daily with a meal.         . ondansetron (ZOFRAN) 8 MG tablet   Oral   Take by mouth every 8 (eight) hours as needed for nausea or vomiting.         . promethazine (PHENERGAN) 25 MG tablet   Oral   Take 25 mg by mouth every 6 (six) hours as needed for nausea or vomiting.         . SUMAtriptan (IMITREX) 50 MG tablet   Oral   Take 50 mg by mouth every 2 (two) hours as needed for migraine. May repeat in 2 hours if headache persists or recurs.         . Abacavir-Dolutegravir-Lamivud (TRIUMEQ) 600-50-300 MG TABS   Oral   Take by mouth.         Marland Kitchen acetaZOLAMIDE (DIAMOX) 500 MG capsule   Oral   Take 500 mg by mouth 2 (two) times daily.         . bisacodyl (DULCOLAX) 10 MG suppository   Rectal   Place 1 suppository (10 mg total) rectally as needed for moderate constipation.   12 suppository   0   . diclofenac (VOLTAREN) 75 MG EC tablet   Oral  Take 1 tablet (75 mg total) by mouth 2 (two) times daily.   30 tablet   0   . ibuprofen (ADVIL,MOTRIN) 800 MG tablet   Oral   Take 800 mg by mouth every 8 (eight) hours as needed.         . traMADol (ULTRAM) 50 MG tablet   Oral   Take by mouth every 6 (six) hours as needed.           Allergies Review of patient's allergies indicates no known allergies.  No family history on file.  Social History History  Substance Use Topics  . Smoking status: Never Smoker   . Smokeless tobacco: Not on file  . Alcohol Use: No    Review of Systems  Constitutional: Negative for fever. Eyes: Negative for visual changes. ENT: Negative for sore throat Cardiovascular: Negative for chest pain. Respiratory: Negative for shortness of breath. Gastrointestinal: Negative for abdominal pain, vomiting and diarrhea. Positive for constipation  Psychiatric: No anxiety  10-point ROS otherwise negative.  ____________________________________________   PHYSICAL EXAM:  VITAL SIGNS: ED Triage Vitals  Enc Vitals Group     BP 04/19/15 1236 109/80 mmHg     Pulse Rate 04/19/15 1236 103     Resp 04/19/15 1236 20     Temp 04/19/15 1236 97 F (36.1 C)     Temp Source 04/19/15 1236 Oral     SpO2 04/19/15 1236 100 %     Weight 04/19/15 1236 143 lb (64.864 kg)     Height 04/19/15 1236 4\' 11"  (1.499 m)     Head Cir --      Peak Flow --      Pain Score 04/19/15 1321 7     Pain Loc --      Pain Edu? --      Excl. in [place]? --      Constitutional: Alert and oriented. Well appearing and in no distress. Eyes: Conjunctivae are normal.  ENT   Head: Normocephalic and atraumatic.   Mouth/Throat: Mucous membranes are moist.  Gastrointestinal: Soft and non-tender in all quadrants. No distention. There is no CVA tenderness. Genitourinary: deferred Musculoskeletal: Nontender with normal range of motion in all extremities. No lower extremity tenderness nor edema. Neurologic:  Normal speech and  language. No gross focal neurologic deficits are appreciated. Skin:  Skin is warm, dry and intact. No rash noted. Psychiatric: Mood and affect are normal. Patient exhibits appropriate insight and judgment.  ____________________________________________    LABS (pertinent positives/negatives)  Labs Reviewed - No data to display  ____________________________________________   EKG  None  ____________________________________________    RADIOLOGY I have personally reviewed any xrays that were ordered on this patient: KUB shows moderate stool burden  ____________________________________________   PROCEDURES  Procedure(s) performed: none  Critical Care performed: none  ____________________________________________   INITIAL IMPRESSION / ASSESSMENT AND PLAN / ED COURSE  Pertinent labs & imaging results that were available during my care of the patient were reviewed by me and considered in my medical decision making (see chart for details).  Patient well-appearing, in no acute distress. Recommend magnesium citrate and will Rx suppositories  ____________________________________________   FINAL CLINICAL IMPRESSION(S) / ED DIAGNOSES  Final diagnoses:  Constipation, unspecified constipation type     Lavonia Drafts, MD 04/19/15 1455

## 2015-04-19 NOTE — ED Notes (Signed)
States having a hard time time having a bowel movement..states she only passed a small amt of stool couple of days ago. No nausea or vomiting.

## 2015-08-04 ENCOUNTER — Encounter: Payer: Self-pay | Admitting: Emergency Medicine

## 2015-08-04 ENCOUNTER — Emergency Department: Payer: Medicaid Other

## 2015-08-04 ENCOUNTER — Emergency Department
Admission: EM | Admit: 2015-08-04 | Discharge: 2015-08-04 | Disposition: A | Discharge status: Home / Self Care | Payer: Medicaid Other | Attending: Emergency Medicine | Admitting: Emergency Medicine

## 2015-08-04 DIAGNOSIS — R42 Dizziness and giddiness: Secondary | ICD-10-CM | POA: Diagnosis not present

## 2015-08-04 DIAGNOSIS — R51 Headache: Secondary | ICD-10-CM | POA: Insufficient documentation

## 2015-08-04 DIAGNOSIS — Z791 Long term (current) use of non-steroidal anti-inflammatories (NSAID): Secondary | ICD-10-CM | POA: Diagnosis not present

## 2015-08-04 DIAGNOSIS — R079 Chest pain, unspecified: Secondary | ICD-10-CM | POA: Diagnosis not present

## 2015-08-04 DIAGNOSIS — R519 Headache, unspecified: Secondary | ICD-10-CM

## 2015-08-04 DIAGNOSIS — Z3202 Encounter for pregnancy test, result negative: Secondary | ICD-10-CM | POA: Insufficient documentation

## 2015-08-04 DIAGNOSIS — R1030 Lower abdominal pain, unspecified: Secondary | ICD-10-CM | POA: Diagnosis not present

## 2015-08-04 DIAGNOSIS — R109 Unspecified abdominal pain: Secondary | ICD-10-CM

## 2015-08-04 DIAGNOSIS — Z720 Tobacco use: Secondary | ICD-10-CM | POA: Insufficient documentation

## 2015-08-04 DIAGNOSIS — Z79899 Other long term (current) drug therapy: Secondary | ICD-10-CM | POA: Insufficient documentation

## 2015-08-04 LAB — BASIC METABOLIC PANEL
Anion gap: 8 (ref 5–15)
BUN: 14 mg/dL (ref 6–20)
CALCIUM: 9.3 mg/dL (ref 8.9–10.3)
CO2: 27 mmol/L (ref 22–32)
Chloride: 102 mmol/L (ref 101–111)
Creatinine, Ser: 0.88 mg/dL (ref 0.44–1.00)
GFR calc Af Amer: >60 mL/min (ref >60)
Glucose, Bld: 90 mg/dL (ref 65–99)
Potassium: 4.1 mmol/L (ref 3.5–5.1)
Sodium: 137 mmol/L (ref 135–145)

## 2015-08-04 LAB — HEPATIC FUNCTION PANEL
ALBUMIN: 4.3 g/dL (ref 3.5–5.0)
ALT: 14 U/L (ref 14–54)
AST: 25 U/L (ref 15–41)
Alkaline Phosphatase: 76 U/L (ref 38–126)
BILIRUBIN DIRECT: 0.2 mg/dL (ref 0.1–0.5)
Indirect Bilirubin: 0.3 mg/dL (ref 0.3–0.9)
Total Bilirubin: 0.5 mg/dL (ref 0.3–1.2)
Total Protein: 7.9 g/dL (ref 6.5–8.1)

## 2015-08-04 LAB — CBC
HCT: 42.6 % (ref 35.0–47.0)
Hemoglobin: 14 g/dL (ref 12.0–16.0)
MCH: 29.9 pg (ref 26.0–34.0)
MCHC: 32.9 g/dL (ref 32.0–36.0)
MCV: 91 fL (ref 80.0–100.0)
Platelets: 242 10*3/uL (ref 150–440)
RBC: 4.68 MIL/uL (ref 3.80–5.20)
RDW: 14.5 % (ref 11.5–14.5)
WBC: 6.9 10*3/uL (ref 3.6–11.0)

## 2015-08-04 LAB — URINALYSIS COMPLETE WITH MICROSCOPIC (ARMC ONLY)
BILIRUBIN URINE: NEGATIVE
Bacteria, UA: NONE SEEN
Glucose, UA: NEGATIVE mg/dL
Hgb urine dipstick: NEGATIVE
Ketones, ur: NEGATIVE mg/dL
Leukocytes, UA: NEGATIVE
Nitrite: NEGATIVE
PROTEIN: NEGATIVE mg/dL
Specific Gravity, Urine: 1.025 (ref 1.005–1.030)
pH: 5 (ref 5.0–8.0)

## 2015-08-04 LAB — TROPONIN I: Troponin I: <0.03 ng/mL (ref <0.031)

## 2015-08-04 LAB — POCT PREGNANCY, URINE: Preg Test, Ur: NEGATIVE

## 2015-08-04 MED ORDER — TRAMADOL HCL 50 MG PO TABS: 50.0000 mg | ORAL_TABLET | Freq: Four times a day (QID) | ORAL | Status: DC | PRN

## 2015-08-04 MED ORDER — TRAMADOL HCL 50 MG PO TABS
50.0000 mg | ORAL_TABLET | Freq: Once | ORAL | Status: AC
  Administered 2015-08-04: 50 mg via ORAL
  Filled 2015-08-04: qty 1

## 2015-08-04 NOTE — ED Notes (Signed)
Pt complains of chest pain with headache, dizziness, and diaphoresis the past couple of days.

## 2015-08-04 NOTE — ED Provider Notes (Signed)
[facility] Emergency Department Provider Note  Time seen: 6:36 PM  I have reviewed the triage vital signs and the nursing notes.   HISTORY  Chief Complaint Chest Pain    HPI Amber Key is a 29 y.o. female with a past medical history of pseudotumor, migraines, presents the emergency department with complaints of headache,central chest pain, dizziness, and lower abdominal pain for the past 2 days. According to the patient for the past 2 days she has noticed lower abdominal pain, somewhat worse when she has a bowel movement. Denies any dysuria, nausea, vomiting, diarrhea or constipation. Also states for the past 2 days she has developed a headache which is consistent with her typical migraine headaches. States a history of pseudotumor cerebri, but states she has not had any issues with this for many years, but it is common for her to get migraine headaches. States she has also been having intermittent central to left-sided chest pains for the past 2 days. Denies any current chest pain. States mild lower abdominal pain currently. States mild to moderate headache.     Past Medical History  Diagnosis Date  . Enlarged lymph nodes unk  . Pseudotumor cerebri unk    There are no active problems to display for this patient.   History reviewed. No pertinent past surgical history.  Current Outpatient Rx  Name  Route  Sig  Dispense  Refill  . Abacavir-Dolutegravir-Lamivud (TRIUMEQ) 294-76-546 MG TABS   Oral   Take by mouth.         Marland Kitchen acetaZOLAMIDE (DIAMOX) 500 MG capsule   Oral   Take 500 mg by mouth 2 (two) times daily.         . bisacodyl (DULCOLAX) 10 MG suppository   Rectal   Place 1 suppository (10 mg total) rectally as needed for moderate constipation.   12 suppository   0   . desipramine (NORPRAMIN) 50 MG tablet   Oral   Take 50 mg by mouth daily.         . diclofenac (VOLTAREN) 75 MG EC tablet   Oral   Take 1 tablet (75 mg total) by mouth  2 (two) times daily.   30 tablet   0   . ibuprofen (ADVIL,MOTRIN) 800 MG tablet   Oral   Take 800 mg by mouth every 8 (eight) hours as needed.         . naproxen (NAPROSYN) 500 MG tablet   Oral   Take 500 mg by mouth 2 (two) times daily with a meal.         . ondansetron (ZOFRAN) 8 MG tablet   Oral   Take by mouth every 8 (eight) hours as needed for nausea or vomiting.         . promethazine (PHENERGAN) 25 MG tablet   Oral   Take 25 mg by mouth every 6 (six) hours as needed for nausea or vomiting.         . SUMAtriptan (IMITREX) 50 MG tablet   Oral   Take 50 mg by mouth every 2 (two) hours as needed for migraine. May repeat in 2 hours if headache persists or recurs.         . traMADol (ULTRAM) 50 MG tablet   Oral   Take by mouth every 6 (six) hours as needed.           Allergies Review of patient's allergies indicates no known allergies.  No family history on file.  Social  History Social History  Substance Use Topics  . Smoking status: Current Some Day Smoker  . Smokeless tobacco: None  . Alcohol Use: No    Review of Systems Constitutional: Negative for fever. Cardiovascular: Mild central chest pain Respiratory: Negative for shortness of breath. Gastrointestinal: Mild lower abdominal pain Genitourinary: Negative for dysuria. Musculoskeletal: Negative for back pain. Skin: Negative for rash. Neurological: Moderate headache. Denies focal weakness or numbness 10-point ROS otherwise negative.  ____________________________________________   PHYSICAL EXAM:  VITAL SIGNS: ED Triage Vitals  Enc Vitals Group     BP 08/04/15 1800 118/86 mmHg     Pulse Rate 08/04/15 1800 109     Resp 08/04/15 1800 16     Temp 08/04/15 1800 97.8 F (36.6 C)     Temp Source 08/04/15 1800 Oral     SpO2 08/04/15 1800 97 %     Weight --      Height 08/04/15 1800 4\' 11"  (1.499 m)     Head Cir --      Peak Flow --      Pain Score 08/04/15 1756 7     Pain Loc --       Pain Edu? --      Excl. in [place]? --    Constitutional: Alert and oriented. Well appearing and in no distress. Eyes: Normal exam ENT   Head: Normocephalic and atraumatic.   Mouth/Throat: Mucous membranes are moist. Cardiovascular: Normal rate, regular rhythm. No murmur Respiratory: Normal respiratory effort without tachypnea nor retractions. Breath sounds are clear Gastrointestinal: Soft, mild lower abdominal tenderness palpation. No distention. No rebound or guarding. No CVA tenderness Musculoskeletal: Nontender with normal range of motion in all extremities.  Neurologic:  Normal speech and language. No gross focal neurologic deficits  Psychiatric: Mood and affect are normal. Speech and behavior are normal.  ____________________________________________    EKG  EKG reviewed and interpreted by myself shows sinus tachycardia 109 bpm, narrow QRS, normal axis, normal intervals, no ST changes present. Overall normal EKG.  ____________________________________________    RADIOLOGY  No acute abnormality on chest x-ray  ____________________________________________    INITIAL IMPRESSION / ASSESSMENT AND PLAN / ED COURSE  Pertinent labs & imaging results that were available during my care of the patient were reviewed by me and considered in my medical decision making (see chart for details).  Patient with 2 days of vague symptoms including headache, chest pain, lower abdominal pain and dizziness. We will check labs including LFTs, urinalysis, troponin. EKG appears normal, chest x-ray appears normal. We will treat with Ultram in the emergency department.  Labs have resulted in largely within normal limits. Negative troponin. Normal x-ray. Patient states she is feeling somewhat better after the Ultram. Will discharge on Ultram, with primary care follow-up. I discussed chest pain as well as abdominal pain return precautions with the patient, she is  agreeable. ____________________________________________   FINAL CLINICAL IMPRESSION(S) / ED DIAGNOSES  Headache Chest pain Abdominal pain   Harvest Dark, MD 08/04/15 2010

## 2015-08-04 NOTE — Discharge Instructions (Signed)
You have been seen in the emergency department today for headache, chest pain, abdominal pain. Her workup has not shown any acute findings. Please take your prescribed pain medication as needed for discomfort, as written. Please follow-up with your primary care physician in the next 1-2 days for recheck/reevaluation. Return to the emergency department for any worsening symptoms, or any symptom personally concerning to yourself.    Abdominal Pain, Adult Many things can cause abdominal pain. Usually, abdominal pain is not caused by a disease and will improve without treatment. It can often be observed and treated at home. Your health care provider will do a physical exam and possibly order blood tests and X-rays to help determine the seriousness of your pain. However, in many cases, more time must pass before a clear cause of the pain can be found. Before that point, your health care provider may not know if you need more testing or further treatment. HOME CARE INSTRUCTIONS Monitor your abdominal pain for any changes. The following actions may help to alleviate any discomfort you are experiencing:  Only take over-the-counter or prescription medicines as directed by your health care provider.  Do not take laxatives unless directed to do so by your health care provider.  Try a clear liquid diet (broth, tea, or water) as directed by your health care provider. Slowly move to a bland diet as tolerated. SEEK MEDICAL CARE IF:  You have unexplained abdominal pain.  You have abdominal pain associated with nausea or diarrhea.  You have pain when you urinate or have a bowel movement.  You experience abdominal pain that wakes you in the night.  You have abdominal pain that is worsened or improved by eating food.  You have abdominal pain that is worsened with eating fatty foods.  You have a fever. SEEK IMMEDIATE MEDICAL CARE IF:  Your pain does not go away within 2 hours.  You keep throwing up  (vomiting).  Your pain is felt only in portions of the abdomen, such as the right side or the left lower portion of the abdomen.  You pass bloody or black tarry stools. MAKE SURE YOU:  Understand these instructions.  Will watch your condition.  Will get help right away if you are not doing well or get worse.   This information is not intended to replace advice given to you by your health care provider. Make sure you discuss any questions you have with your health care provider.   Document Released: 07/06/2005 Document Revised: 06/17/2015 Document Reviewed: 06/05/2013 Elsevier Interactive Patient Education 2016 [place] Headache Without Cause A headache is pain or discomfort felt around the head or neck area. There are many causes and types of headaches. In some cases, the cause may not be found.  HOME CARE  Managing Pain  Take over-the-counter and prescription medicines only as told by your doctor.  Lie down in a dark, quiet room when you have a headache.  If directed, apply ice to the head and neck area:  Put ice in a plastic bag.  Place a towel between your skin and the bag.  Leave the ice on for 20 minutes, 2-3 times per day.  Use a heating pad or hot shower to apply heat to the head and neck area as told by your doctor.  Keep lights dim if bright lights bother you or make your headaches worse. Eating and Drinking  Eat meals on a regular schedule.  Lessen how much alcohol you drink.  Lessen  how much caffeine you drink, or stop drinking caffeine. General Instructions  Keep all follow-up visits as told by your doctor. This is important.  Keep a journal to find out if certain things bring on headaches. For example, write down:  What you eat and drink.  How much sleep you get.  Any change to your diet or medicines.  Relax by getting a massage or doing other relaxing activities.  Lessen stress.  Sit up straight. Do not tighten (tense) your  muscles.  Do not use tobacco products. This includes cigarettes, chewing tobacco, or e-cigarettes. If you need help quitting, ask your doctor.  Exercise regularly as told by your doctor.  Get enough sleep. This often means 7-9 hours of sleep. GET HELP IF:  Your symptoms are not helped by medicine.  You have a headache that feels different than the other headaches.  You feel sick to your stomach (nauseous) or you throw up (vomit).  You have a fever. GET HELP RIGHT AWAY IF:   Your headache becomes really bad.  You keep throwing up.  You have a stiff neck.  You have trouble seeing.  You have trouble speaking.  You have pain in the eye or ear.  Your muscles are weak or you lose muscle control.  You lose your balance or have trouble walking.  You feel like you will pass out (faint) or you pass out.  You have confusion.   This information is not intended to replace advice given to you by your health care provider. Make sure you discuss any questions you have with your health care provider.   Document Released: 07/05/2008 Document Revised: 06/17/2015 Document Reviewed: 01/19/2015 Elsevier Interactive Patient Education [employer].

## 2015-09-24 ENCOUNTER — Encounter: Payer: Self-pay | Admitting: Emergency Medicine

## 2015-09-24 DIAGNOSIS — R079 Chest pain, unspecified: Secondary | ICD-10-CM | POA: Diagnosis not present

## 2015-09-24 DIAGNOSIS — R000 Tachycardia, unspecified: Secondary | ICD-10-CM | POA: Diagnosis not present

## 2015-09-24 DIAGNOSIS — Z79899 Other long term (current) drug therapy: Secondary | ICD-10-CM | POA: Diagnosis not present

## 2015-09-24 DIAGNOSIS — K5900 Constipation, unspecified: Secondary | ICD-10-CM | POA: Diagnosis not present

## 2015-09-24 DIAGNOSIS — R0602 Shortness of breath: Secondary | ICD-10-CM | POA: Insufficient documentation

## 2015-09-24 DIAGNOSIS — Z791 Long term (current) use of non-steroidal anti-inflammatories (NSAID): Secondary | ICD-10-CM | POA: Insufficient documentation

## 2015-09-24 DIAGNOSIS — F17200 Nicotine dependence, unspecified, uncomplicated: Secondary | ICD-10-CM | POA: Insufficient documentation

## 2015-09-24 DIAGNOSIS — R112 Nausea with vomiting, unspecified: Secondary | ICD-10-CM | POA: Diagnosis not present

## 2015-09-24 DIAGNOSIS — R1084 Generalized abdominal pain: Secondary | ICD-10-CM | POA: Diagnosis present

## 2015-09-24 LAB — CBC WITH DIFFERENTIAL/PLATELET
BASOS ABS: 0.1 10*3/uL (ref 0–0.1)
Basophils Relative: 1 %
EOS ABS: 0.2 10*3/uL (ref 0–0.7)
Eosinophils Relative: 3 %
HCT: 40.8 % (ref 35.0–47.0)
Hemoglobin: 13.1 g/dL (ref 12.0–16.0)
Lymphocytes Relative: 33 %
Lymphs Abs: 2.6 10*3/uL (ref 1.0–3.6)
MCH: 29.1 pg (ref 26.0–34.0)
MCHC: 32.2 g/dL (ref 32.0–36.0)
MCV: 90.3 fL (ref 80.0–100.0)
Monocytes Absolute: 0.7 10*3/uL (ref 0.2–0.9)
Monocytes Relative: 9 %
Neutro Abs: 4.3 10*3/uL (ref 1.4–6.5)
Neutrophils Relative %: 54 %
PLATELETS: 252 10*3/uL (ref 150–440)
RBC: 4.52 MIL/uL (ref 3.80–5.20)
RDW: 15 % — ABNORMAL HIGH (ref 11.5–14.5)
WBC: 7.9 10*3/uL (ref 3.6–11.0)

## 2015-09-24 LAB — COMPREHENSIVE METABOLIC PANEL
ALT: 14 U/L (ref 14–54)
AST: 20 U/L (ref 15–41)
Albumin: 4.2 g/dL (ref 3.5–5.0)
Alkaline Phosphatase: 68 U/L (ref 38–126)
Anion gap: 7 (ref 5–15)
BUN: 18 mg/dL (ref 6–20)
CALCIUM: 9.3 mg/dL (ref 8.9–10.3)
CHLORIDE: 106 mmol/L (ref 101–111)
CO2: 27 mmol/L (ref 22–32)
CREATININE: 0.84 mg/dL (ref 0.44–1.00)
GFR calc non Af Amer: >60 mL/min (ref >60)
Glucose, Bld: 75 mg/dL (ref 65–99)
Potassium: 3.4 mmol/L — ABNORMAL LOW (ref 3.5–5.1)
Sodium: 140 mmol/L (ref 135–145)
Total Bilirubin: 0.7 mg/dL (ref 0.3–1.2)
Total Protein: 7.6 g/dL (ref 6.5–8.1)

## 2015-09-24 NOTE — ED Notes (Signed)
Pt presents to ED with c/o generalized abd pain and chest pain. Pt reports she has been suffering from severe constipation since this past summer; worsening last night. Currently being tx for lymphoma with oral chemo at home daily. vomited X3 on Sunday. Pt states she administered a mineral oil enema yesterday with no relief. States she is worried there is some type of blockage.

## 2015-09-25 ENCOUNTER — Emergency Department: Payer: Medicaid Other

## 2015-09-25 ENCOUNTER — Encounter: Payer: Self-pay | Admitting: Emergency Medicine

## 2015-09-25 ENCOUNTER — Emergency Department
Admission: EM | Admit: 2015-09-25 | Discharge: 2015-09-25 | Disposition: A | Discharge status: Home / Self Care | Payer: Medicaid Other | Attending: Emergency Medicine | Admitting: Emergency Medicine

## 2015-09-25 DIAGNOSIS — G8929 Other chronic pain: Secondary | ICD-10-CM

## 2015-09-25 DIAGNOSIS — R1084 Generalized abdominal pain: Secondary | ICD-10-CM

## 2015-09-25 DIAGNOSIS — K5900 Constipation, unspecified: Secondary | ICD-10-CM

## 2015-09-25 DIAGNOSIS — R0789 Other chest pain: Secondary | ICD-10-CM

## 2015-09-25 HISTORY — DX: Asymptomatic human immunodeficiency virus (hiv) infection status: Z21

## 2015-09-25 HISTORY — DX: Human immunodeficiency virus (HIV) disease: B20

## 2015-09-25 HISTORY — DX: Non-Hodgkin lymphoma, unspecified, unspecified site: C85.90

## 2015-09-25 LAB — URINALYSIS COMPLETE WITH MICROSCOPIC (ARMC ONLY)
BILIRUBIN URINE: NEGATIVE
GLUCOSE, UA: NEGATIVE mg/dL
Ketones, ur: NEGATIVE mg/dL
LEUKOCYTES UA: NEGATIVE
NITRITE: NEGATIVE
Protein, ur: NEGATIVE mg/dL
SPECIFIC GRAVITY, URINE: 1.01 (ref 1.005–1.030)
pH: 5 (ref 5.0–8.0)

## 2015-09-25 MED ORDER — IOHEXOL 240 MG/ML SOLN
25.0000 mL | Freq: Once | INTRAMUSCULAR | Status: AC | PRN
  Administered 2015-09-25: 25 mL via ORAL

## 2015-09-25 MED ORDER — IOHEXOL 350 MG/ML SOLN
100.0000 mL | Freq: Once | INTRAVENOUS | Status: AC | PRN
  Administered 2015-09-25: 100 mL via INTRAVENOUS

## 2015-09-25 MED ORDER — ONDANSETRON HCL 4 MG/2ML IJ SOLN
4.0000 mg | Freq: Once | INTRAMUSCULAR | Status: AC
  Administered 2015-09-25: 4 mg via INTRAVENOUS
  Filled 2015-09-25: qty 2

## 2015-09-25 MED ORDER — SODIUM CHLORIDE 0.9 % IV BOLUS (SEPSIS)
500.0000 mL | INTRAVENOUS | Status: AC
  Administered 2015-09-25: 500 mL via INTRAVENOUS

## 2015-09-25 MED ORDER — KETOROLAC TROMETHAMINE 30 MG/ML IJ SOLN
30.0000 mg | Freq: Once | INTRAMUSCULAR | Status: AC
  Administered 2015-09-25: 30 mg via INTRAVENOUS
  Filled 2015-09-25: qty 1

## 2015-09-25 NOTE — ED Provider Notes (Addendum)
[facility] Emergency Department Provider Note  ____________________________________________  Time seen: Approximately 1:44 AM  I have reviewed the triage vital signs and the nursing notes.   HISTORY  Chief Complaint Chest Pain and Abdominal Pain    HPI Amber Key is a 29 y.o. female reports a history of lymphoma for which she takes oral chemotherapy who presents with several days of episodic central chest pain that she rates as she severe sharp and stabbing.  She is also complaining of worsening abdominal pain that is generalized, waxes and wanes, in which she thinks is a result of constipation but is much worse than usual.  She has tried laxatives and enemas and nothing is working.  She says that she has had this problem in the past but is much more severe.  It is been gradual in onset over the last week.  The chest pain she states is a new symptom for her and is accompanied with mild shortness of breath when it is present.he makes either of her symptoms better and nothing makes it worse.  She denies fever/chills, dysuria, and nausea and vomiting although she did have 5 episodes of vomiting several days ago.  She does have some persistent nausea.  She is still been able to eat and drink.  She had a very small bowel movement 2 days ago after an enema.   Past Medical History  Diagnosis Date  . Enlarged lymph nodes unk  . Pseudotumor cerebri unk  . HIV (human immunodeficiency virus infection) ([place])     diagnosed years ago at [facility]  . Lymphoma ([place])     There are no active problems to display for this patient.   Past Surgical History  Procedure Laterality Date  . Colonoscopy    . Upper gi endoscopy      Current Outpatient Rx  Name  Route  Sig  Dispense  Refill  . Abacavir-Dolutegravir-Lamivud (TRIUMEQ) [id number] MG TABS   Oral   Take by mouth.         Marland Kitchen acetaZOLAMIDE (DIAMOX) 500 MG capsule   Oral   Take 500 mg by mouth 2 (two) times daily.          . bisacodyl (DULCOLAX) 10 MG suppository   Rectal   Place 1 suppository (10 mg total) rectally as needed for moderate constipation.   12 suppository   0   . desipramine (NORPRAMIN) 50 MG tablet   Oral   Take 50 mg by mouth daily.         . diclofenac (VOLTAREN) 75 MG EC tablet   Oral   Take 1 tablet (75 mg total) by mouth 2 (two) times daily.   30 tablet   0   . ibuprofen (ADVIL,MOTRIN) 800 MG tablet   Oral   Take 800 mg by mouth every 8 (eight) hours as needed.         . naproxen (NAPROSYN) 500 MG tablet   Oral   Take 500 mg by mouth 2 (two) times daily with a meal.         . ondansetron (ZOFRAN) 8 MG tablet   Oral   Take by mouth every 8 (eight) hours as needed for nausea or vomiting.         . promethazine (PHENERGAN) 25 MG tablet   Oral   Take 25 mg by mouth every 6 (six) hours as needed for nausea or vomiting.         . SUMAtriptan (IMITREX) 50 MG  tablet   Oral   Take 50 mg by mouth every 2 (two) hours as needed for migraine. May repeat in 2 hours if headache persists or recurs.         . traMADol (ULTRAM) 50 MG tablet   Oral   Take 1 tablet (50 mg total) by mouth every 6 (six) hours as needed.   20 tablet   0     Allergies Review of patient's allergies indicates no known allergies.  No family history on file.  Social History Social History  Substance Use Topics  . Smoking status: Current Some Day Smoker  . Smokeless tobacco: None  . Alcohol Use: No    Review of Systems Constitutional: No fever/chills Eyes: No visual changes. ENT: No sore throat. Cardiovascular: severe episodic central chest pain Respiratory: minimal shortness of breath associated with chest pain Gastrointestinal: generalized abdominal pain and distention with nausea, vomiting several days ago, and constipation Genitourinary: Negative for dysuria. Musculoskeletal: Negative for back pain. Skin: Negative for rash. Neurological: Negative for headaches, focal  weakness or numbness.  10-point ROS otherwise negative.  ____________________________________________   PHYSICAL EXAM:  VITAL SIGNS: ED Triage Vitals  Enc Vitals Group     BP 09/24/15 2315 117/82 mmHg     Pulse Rate 09/24/15 2315 110     Resp 09/24/15 2315 18     Temp 09/24/15 2315 98.6 F (37 C)     Temp Source 09/24/15 2315 Oral     SpO2 09/24/15 2315 100 %     Weight 09/24/15 2315 158 lb (71.668 kg)     Height 09/24/15 2315 4\' 11"  (1.499 m)     Head Cir --      Peak Flow --      Pain Score 09/24/15 2319 7     Pain Loc --      Pain Edu? --      Excl. in [place]? --     Constitutional: Alert and oriented. Well appearing and in no acute distress at this time Eyes: Conjunctivae are normal. PERRL. EOMI. Head: Atraumatic. Nose: No congestion/rhinnorhea. Mouth/Throat: Mucous membranes are moist.  Oropharynx non-erythematous. Neck: No stridor.   Cardiovascular:borderline tachycardia, regular rhythm. Grossly normal heart sounds.  Good peripheral circulation. Respiratory: Normal respiratory effort.  No retractions. Lungs CTAB. Gastrointestinal:soft with generalized mild tenderness to palpation and mild distention.. Musculoskeletal: No lower extremity tenderness nor edema.  No joint effusions. Neurologic:  Normal speech and language. No gross focal neurologic deficits are appreciated.  Skin:  Skin is warm, dry and intact. No rash noted. Psychiatric: Mood and affect are normal. Speech and behavior are normal.  ____________________________________________   LABS (all labs ordered are listed, but only abnormal results are displayed)  Labs Reviewed  CBC WITH DIFFERENTIAL/PLATELET - Abnormal; Notable for the following:    RDW 15.0 (*)    All other components within normal limits  COMPREHENSIVE METABOLIC PANEL - Abnormal; Notable for the following:    Potassium 3.4 (*)    All other components within normal limits  URINALYSIS COMPLETEWITH MICROSCOPIC ([facility] ONLY) - Abnormal;  Notable for the following:    Color, Urine STRAW (*)    APPearance CLEAR (*)    Hgb urine dipstick 1+ (*)    Bacteria, UA RARE (*)    Squamous Epithelial / LPF 0-5 (*)    All other components within normal limits  POC URINE PREG, ED   ____________________________________________  EKG  ED ECG REPORT I, Bruk Tumolo, the attending physician, personally viewed  and interpreted this ECG.  Date: 09/24/2015 EKG Time: 23:20 Rate: 108 Rhythm: sinus tachycardia QRS Axis: normal Intervals: normal ST/T Wave abnormalities: normal Conduction Disutrbances: none Narrative Interpretation: unremarkable  ____________________________________________  RADIOLOGY   Ct Angio Chest Pe W/cm &/or Wo Cm  09/25/2015  CLINICAL DATA:  Acute onset of generalized abdominal pain and chest pain. Severe constipation. Current history of lymphoma, on chemotherapy. Initial encounter. EXAM: CT ANGIOGRAPHY CHEST CT ABDOMEN AND PELVIS WITH CONTRAST TECHNIQUE: Multidetector CT imaging of the chest was performed using the standard protocol during bolus administration of intravenous contrast. Multiplanar CT image reconstructions and MIPs were obtained to evaluate the vascular anatomy. Multidetector CT imaging of the abdomen and pelvis was performed using the standard protocol during bolus administration of intravenous contrast. CONTRAST:  120mL OMNIPAQUE IOHEXOL 350 MG/ML SOLN COMPARISON:  Chest radiograph performed 08/04/2015, and CT of the abdomen and pelvis from 07/10/2014 FINDINGS: CTA CHEST FINDINGS There is no evidence of pulmonary embolus. The lungs appear clear bilaterally. There is no evidence of significant focal consolidation, pleural effusion or pneumothorax. No masses are identified; no abnormal focal contrast enhancement is seen. The mediastinum is unremarkable appearance. No mediastinal lymphadenopathy is seen. No pericardial effusion is identified. The great vessels are grossly unremarkable in appearance. No  axillary lymphadenopathy is seen. The visualized portions of the thyroid gland are unremarkable in appearance. No acute osseous abnormalities are seen. CT ABDOMEN and PELVIS FINDINGS The liver and spleen are unremarkable in appearance. The gallbladder is within normal limits. The pancreas and adrenal glands are unremarkable. The kidneys are unremarkable in appearance. There is no evidence of hydronephrosis. No renal or ureteral stones are seen. No perinephric stranding is appreciated. No free fluid is identified. The small bowel is unremarkable in appearance. The stomach is within normal limits. No acute vascular abnormalities are seen. The appendix is normal in caliber and contains air, without evidence of appendicitis. The colon is partially filled with stool and is unremarkable in appearance. The bladder is moderately distended and grossly remarkable. The patient's intrauterine device is noted in expected position at the fundus of the uterus. A likely tiny fibroid is noted at the uterine fundus. The ovaries are relatively symmetric. No suspicious adnexal masses are seen. No inguinal lymphadenopathy is seen. No acute osseous abnormalities are identified. Review of the MIP images confirms the above findings. IMPRESSION: 1. No evidence of pulmonary embolus. 2. Lungs clear bilaterally. 3. No acute abnormality seen within the abdomen or pelvis. 4. Likely tiny uterine fibroid noted. Intrauterine device noted in expected position at the fundus of the uterus. Electronically Signed   By: Garald Balding M.D.   On: 09/25/2015 03:51   Ct Abdomen Pelvis W Contrast  09/25/2015  CLINICAL DATA:  Acute onset of generalized abdominal pain and chest pain. Severe constipation. Current history of lymphoma, on chemotherapy. Initial encounter. EXAM: CT ANGIOGRAPHY CHEST CT ABDOMEN AND PELVIS WITH CONTRAST TECHNIQUE: Multidetector CT imaging of the chest was performed using the standard protocol during bolus administration of  intravenous contrast. Multiplanar CT image reconstructions and MIPs were obtained to evaluate the vascular anatomy. Multidetector CT imaging of the abdomen and pelvis was performed using the standard protocol during bolus administration of intravenous contrast. CONTRAST:  173mL OMNIPAQUE IOHEXOL 350 MG/ML SOLN COMPARISON:  Chest radiograph performed 08/04/2015, and CT of the abdomen and pelvis from 07/10/2014 FINDINGS: CTA CHEST FINDINGS There is no evidence of pulmonary embolus. The lungs appear clear bilaterally. There is no evidence of significant focal consolidation, pleural effusion or  pneumothorax. No masses are identified; no abnormal focal contrast enhancement is seen. The mediastinum is unremarkable appearance. No mediastinal lymphadenopathy is seen. No pericardial effusion is identified. The great vessels are grossly unremarkable in appearance. No axillary lymphadenopathy is seen. The visualized portions of the thyroid gland are unremarkable in appearance. No acute osseous abnormalities are seen. CT ABDOMEN and PELVIS FINDINGS The liver and spleen are unremarkable in appearance. The gallbladder is within normal limits. The pancreas and adrenal glands are unremarkable. The kidneys are unremarkable in appearance. There is no evidence of hydronephrosis. No renal or ureteral stones are seen. No perinephric stranding is appreciated. No free fluid is identified. The small bowel is unremarkable in appearance. The stomach is within normal limits. No acute vascular abnormalities are seen. The appendix is normal in caliber and contains air, without evidence of appendicitis. The colon is partially filled with stool and is unremarkable in appearance. The bladder is moderately distended and grossly remarkable. The patient's intrauterine device is noted in expected position at the fundus of the uterus. A likely tiny fibroid is noted at the uterine fundus. The ovaries are relatively symmetric. No suspicious adnexal  masses are seen. No inguinal lymphadenopathy is seen. No acute osseous abnormalities are identified. Review of the MIP images confirms the above findings. IMPRESSION: 1. No evidence of pulmonary embolus. 2. Lungs clear bilaterally. 3. No acute abnormality seen within the abdomen or pelvis. 4. Likely tiny uterine fibroid noted. Intrauterine device noted in expected position at the fundus of the uterus. Electronically Signed   By: Garald Balding M.D.   On: 09/25/2015 03:51    ____________________________________________   PROCEDURES  Procedure(s) performed: None  Critical Care performed: No ____________________________________________   INITIAL IMPRESSION / ASSESSMENT AND PLAN / ED COURSE  Pertinent labs & imaging results that were available during my care of the patient were reviewed by me and considered in my medical decision making (see chart for details).  Labs are unremarkable.  Given the patient's history of smoking and lymphoma her chest pain is concerning and puts her at a moderate risk of pulmonary embolism.  I will need to evaluate with a CT angiogram and I did discuss with the patient and her family the risks and benefits of scanning.  They prefer to proceed with the test.  Also feel that I need to evaluate her abdomen with a CT abdomen and pelvis with by mouth and IV contrast given her persistent worsening symptoms as well as mild distention and the possibility of an ileus versus partial SBO.  I will proceed with nonnarcotic pain medicine at this time because I do not want to make the constipation worse.  ----------------------------------------- 2:04 AM on 09/25/2015 -----------------------------------------  I reviewed the patient's medical records and care everywhere and she did not disclose that she has tested positive for HIV and sees [facility] infectious disease.  However her home medications do include antiviral meds.  It is unclear how this is playing into her current symptoms as  well or if it is related.  But further review of the [facility] records indicate that she has been having chronic constipation for at least 6 months and her infectious disease notes mention IBS-like syndrome.  I will proceed with imaging as planned but I suspect she will not have an acute GI abnormality at this time.  I still feel she is at least moderate risk for pulmonary embolism given the history she is providing and I still need to rule her out with a CT  angiography.  Of note she is also having no leg pain or swelling.  ----------------------------------------- 4:40 AM on 09/25/2015 -----------------------------------------  The patient is sleeping comfortably in bed.  I gave her the reassuring lab results of her workup including her CT scans.  I pointed out that these issues of actually been going on much longer than they initially indicated and that while I ruled out emergent conditions at this time, it is unlikely I will be able to diagnose or fix her chronic pain and irritable bowel syndrome type issues.  I recommended close outpatient follow-up.  She and her family understand and agree. ____________________________________________  FINAL CLINICAL IMPRESSION(S) / ED DIAGNOSES  Final diagnoses:  Constipation, unspecified constipation type  Chronic generalized abdominal pain  Atypical chest pain      NEW MEDICATIONS STARTED DURING THIS VISIT:  New Prescriptions   No medications on file     Hinda Kehr, MD 09/25/15 [id number]  Hinda Kehr, MD 09/25/15 [phone]

## 2015-09-25 NOTE — Discharge Instructions (Signed)
You were seen in the emergency department today for constipation.  We recommend that you use one or more of the following over-the-counter medications in the order described: °  °1)  Colace (or Dulcolax) 100 mg:  This is a stool softener, and you may take it once or twice a day as needed. °2)  Senna tablets:  This is a bowel stimulant that will help "push" out your stool. It is the next step to add after you have tried a stool softener. °3)  Miralax (powder):  This medication works by drawing additional fluid into your intestines and helps to flush out your stool.  Mix the powder with water or juice according to label instructions.  It may help if the Colace and Senna are not sufficient, but you must be sure to use the recommended amount of water or juice when you mix up the powder. °Remember that narcotic pain medications are constipating, so avoid them or minimize their use.  Drink plenty of fluids. ° °Please return to the Emergency Department immediately if you develop new or worsening symptoms that concern you, such as (but not limited to) fever > 101 degrees, severe abdominal pain, or persistent vomiting. ° ° °Constipation °Constipation is when a person has fewer than three bowel movements a week, has difficulty having a bowel movement, or has stools that are dry, hard, or larger than normal. As people grow older, constipation is more common. If you try to fix constipation with medicines that make you have a bowel movement (laxatives), the problem may get worse. Long-term laxative use may cause the muscles of the colon to become weak. A low-fiber diet, not taking in enough fluids, and taking certain medicines may make constipation worse.  °CAUSES  °Certain medicines, such as antidepressants, pain medicine, iron supplements, antacids, and water pills.   °Certain diseases, such as diabetes, irritable bowel syndrome (IBS), thyroid disease, or depression.   °Not drinking enough water.   °Not eating enough  fiber-rich foods.   °Stress or travel.   °Lack of physical activity or exercise.   °Ignoring the urge to have a bowel movement.   °Using laxatives too much.   °SIGNS AND SYMPTOMS  °Having fewer than three bowel movements a week.   °Straining to have a bowel movement.   °Having stools that are hard, dry, or larger than normal.   °Feeling full or bloated.   °Pain in the lower abdomen.   °Not feeling relief after having a bowel movement.   °DIAGNOSIS  °Your health care provider will take a medical history and perform a physical exam. Further testing may be done for severe constipation. Some tests may include: °A barium enema X-ray to examine your rectum, colon, and, sometimes, your small intestine.   °A sigmoidoscopy to examine your lower colon.   °A colonoscopy to examine your entire colon. °TREATMENT  °Treatment will depend on the severity of your constipation and what is causing it. Some dietary treatments include drinking more fluids and eating more fiber-rich foods. Lifestyle treatments may include regular exercise. If these diet and lifestyle recommendations do not help, your health care provider may recommend taking over-the-counter laxative medicines to help you have bowel movements. Prescription medicines may be prescribed if over-the-counter medicines do not work.  °HOME CARE INSTRUCTIONS  °Eat foods that have a lot of fiber, such as fruits, vegetables, whole grains, and beans. °Limit foods high in fat and processed sugars, such as french fries, hamburgers, cookies, candies, and soda.   °A   fiber supplement may be added to your diet if you cannot get enough fiber from foods.   °Drink enough fluids to keep your urine clear or pale yellow.   °Exercise regularly or as directed by your health care provider.   °Go to the restroom when you have the urge to go. Do not hold it.   °Only take over-the-counter or prescription medicines as directed by your health care provider. Do not take other medicines for constipation  without talking to your health care provider first.   °SEEK IMMEDIATE MEDICAL CARE IF:  °You have bright red blood in your stool.   °Your constipation lasts for more than 4 days or gets worse.   °You have abdominal or rectal pain.   °You have thin, pencil-like stools.   °You have unexplained weight loss. °MAKE SURE YOU:  °Understand these instructions. °Will watch your condition. °Will get help right away if you are not doing well or get worse. °Document Released: 06/24/2004 Document Revised: 10/01/2013 Document Reviewed: 07/08/2013 °ExitCare® Patient Information ©2015 [employer]. This information is not intended to replace advice given to you by your health care provider. Make sure you discuss any questions you have with your health care provider. ° ° ° °

## 2015-09-25 NOTE — ED Notes (Signed)
Pt POCT results were NEGATIVE

## 2015-10-01 ENCOUNTER — Encounter: Payer: Self-pay | Admitting: Emergency Medicine

## 2015-10-01 ENCOUNTER — Emergency Department
Admission: EM | Admit: 2015-10-01 | Discharge: 2015-10-01 | Disposition: A | Discharge status: Home / Self Care | Payer: Medicaid Other | Attending: Emergency Medicine | Admitting: Emergency Medicine

## 2015-10-01 DIAGNOSIS — Y998 Other external cause status: Secondary | ICD-10-CM | POA: Diagnosis not present

## 2015-10-01 DIAGNOSIS — S39012A Strain of muscle, fascia and tendon of lower back, initial encounter: Secondary | ICD-10-CM | POA: Diagnosis not present

## 2015-10-01 DIAGNOSIS — Y92410 Unspecified street and highway as the place of occurrence of the external cause: Secondary | ICD-10-CM | POA: Diagnosis not present

## 2015-10-01 DIAGNOSIS — S20219A Contusion of unspecified front wall of thorax, initial encounter: Secondary | ICD-10-CM

## 2015-10-01 DIAGNOSIS — F17200 Nicotine dependence, unspecified, uncomplicated: Secondary | ICD-10-CM | POA: Diagnosis not present

## 2015-10-01 DIAGNOSIS — Y9389 Activity, other specified: Secondary | ICD-10-CM | POA: Insufficient documentation

## 2015-10-01 DIAGNOSIS — Z79899 Other long term (current) drug therapy: Secondary | ICD-10-CM | POA: Diagnosis not present

## 2015-10-01 DIAGNOSIS — S3992XA Unspecified injury of lower back, initial encounter: Secondary | ICD-10-CM | POA: Diagnosis present

## 2015-10-01 DIAGNOSIS — Z791 Long term (current) use of non-steroidal anti-inflammatories (NSAID): Secondary | ICD-10-CM | POA: Insufficient documentation

## 2015-10-01 MED ORDER — TRAMADOL HCL 50 MG PO TABS: 50.0000 mg | ORAL_TABLET | Freq: Four times a day (QID) | ORAL | Status: DC | PRN

## 2015-10-01 MED ORDER — CYCLOBENZAPRINE HCL 10 MG PO TABS
10.0000 mg | ORAL_TABLET | Freq: Once | ORAL | Status: AC
  Administered 2015-10-01: 10 mg via ORAL
  Filled 2015-10-01: qty 1

## 2015-10-01 MED ORDER — CYCLOBENZAPRINE HCL 10 MG PO TABS: 10.0000 mg | ORAL_TABLET | Freq: Three times a day (TID) | ORAL | Status: DC | PRN

## 2015-10-01 MED ORDER — TRAMADOL HCL 50 MG PO TABS
50.0000 mg | ORAL_TABLET | Freq: Once | ORAL | Status: AC
  Administered 2015-10-01: 50 mg via ORAL
  Filled 2015-10-01: qty 1

## 2015-10-01 NOTE — ED Notes (Signed)
PA at bedside.
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
## 2015-10-01 NOTE — Discharge Instructions (Signed)
Chest Contusion °A contusion is a deep bruise. Bruises happen when an injury causes bleeding under the skin. Signs of bruising include pain, puffiness (swelling), and discolored skin. The bruise may turn blue, purple, or yellow.  °HOME CARE °· Put ice on the injured area. °¨ Put ice in a plastic bag. °¨ Place a towel between the skin and the bag. °¨ Leave the ice on for 15-20 minutes at a time, 03-04 times a day for the first 48 hours. °· Only take medicine as told by your doctor. °· Rest. °· Take deep breaths (deep-breathing exercises) as told by your doctor. °· Stop smoking if you smoke. °· Do not lift objects over 5 pounds (2.3 kilograms) for 3 days or longer if told by your doctor. °GET HELP RIGHT AWAY IF:  °· You have more bruising or puffiness. °· You have pain that gets worse. °· You have trouble breathing. °· You are dizzy, weak, or pass out (faint). °· You have blood in your pee (urine) or poop (stool). °· You cough up or throw up (vomit) blood. °· Your puffiness or pain is not helped with medicines. °MAKE SURE YOU:  °· Understand these instructions. °· Will watch your condition. °· Will get help right away if you are not doing well or get worse. °  °This information is not intended to replace advice given to you by your health care provider. Make sure you discuss any questions you have with your health care provider. °  °Document Released: 03/14/2008 Document Revised: 06/20/2012 Document Reviewed: 03/19/2012 °Elsevier Interactive Patient Education ©2016 Elsevier Inc. ° °
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
## 2015-11-05 ENCOUNTER — Ambulatory Visit (INDEPENDENT_AMBULATORY_CARE_PROVIDER_SITE_OTHER): Payer: Medicaid Other | Admitting: Obstetrics and Gynecology

## 2015-11-05 ENCOUNTER — Encounter: Payer: Self-pay | Admitting: Obstetrics and Gynecology

## 2015-11-05 VITALS — BP 117/74 | HR 116 | Ht 59.0 in | Wt 170.3 lb

## 2015-11-05 DIAGNOSIS — Z0000 Encounter for general adult medical examination without abnormal findings: Secondary | ICD-10-CM | POA: Diagnosis not present

## 2015-11-05 DIAGNOSIS — B20 Human immunodeficiency virus [HIV] disease: Secondary | ICD-10-CM

## 2015-11-05 DIAGNOSIS — Z8669 Personal history of other diseases of the nervous system and sense organs: Secondary | ICD-10-CM

## 2015-11-05 DIAGNOSIS — G932 Benign intracranial hypertension: Secondary | ICD-10-CM | POA: Diagnosis not present

## 2015-11-05 DIAGNOSIS — Z21 Asymptomatic human immunodeficiency virus [HIV] infection status: Secondary | ICD-10-CM | POA: Diagnosis not present

## 2015-11-05 DIAGNOSIS — Z01419 Encounter for gynecological examination (general) (routine) without abnormal findings: Secondary | ICD-10-CM

## 2015-11-05 NOTE — Patient Instructions (Signed)
1.  No Pap smear due to normal Pap in July 2016. 2.  Contraception-Mirena IUD,, to be continued.  Patient will discuss with neurology regarding possible need for removal due to possible association with migraine headaches. 3.  Healthy eating and exercise encouraged; slow steady weight loss recommended. 4.  Return if IUD needs removal.

## 2015-11-05 NOTE — Progress Notes (Signed)
Patient ID: Amber Key, female   DOB: [birth year], 30 y.o.   MRN: [id number] ANNUAL PREVENTATIVE CARE GYN  ENCOUNTER NOTE  Subjective:       Amber Key is a 30 y.o. [phone] female here for a routine annual gynecologic exam.  Current complaints: 1.  Possible iud removed today   2. left/right breast tenderness   Gynecologic History No LMP recorded. Patient is not currently having periods (Reason: IUD). Contraception: IUD Last Pap: 04/2015. Results were: normal Last mammogram: n/a. Results were: n/a  Obstetric History OB History  Gravida Para Term Preterm AB SAB TAB Ectopic Multiple Living  6 1 1  5 4  1  1     # Outcome Date GA Lbr Len/2nd Weight Sex Delivery Anes PTL Lv  6 Term 2007   5 lb 2.4 oz (2.336 kg) F Vag-Spont   Y  5 SAB           4 SAB           3 SAB           2 SAB           1 Ectopic               Past Medical History  Diagnosis Date  . Enlarged lymph nodes unk  . Pseudotumor cerebri unk  . HIV (human immunodeficiency virus infection) ([place])     diagnosed years ago at [facility]  . Lymphoma ([place])   . Migraines   . Vision loss   . Memory loss     Past Surgical History  Procedure Laterality Date  . Colonoscopy    . Upper gi endoscopy    . Wisdom teeth    . Lumbar puncture      Current Outpatient Prescriptions on File Prior to Visit  Medication Sig Dispense Refill  . Abacavir-Dolutegravir-Lamivud (TRIUMEQ) [id number] MG TABS Take by mouth.    Marland Kitchen acetaZOLAMIDE (DIAMOX) 500 MG capsule Take 500 mg by mouth 2 (two) times daily.    Marland Kitchen desipramine (NORPRAMIN) 50 MG tablet Take 50 mg by mouth daily.    . diclofenac (VOLTAREN) 75 MG EC tablet Take 1 tablet (75 mg total) by mouth 2 (two) times daily. 30 tablet 0  . naproxen (NAPROSYN) 500 MG tablet Take 500 mg by mouth 2 (two) times daily with a meal.    . ondansetron (ZOFRAN) 8 MG tablet Take by mouth every 8 (eight) hours as needed for nausea or vomiting.    . promethazine (PHENERGAN) 25 MG tablet Take 25 mg by mouth  every 6 (six) hours as needed for nausea or vomiting.    . SUMAtriptan (IMITREX) 50 MG tablet Take 50 mg by mouth every 2 (two) hours as needed for migraine. May repeat in 2 hours if headache persists or recurs.     No current facility-administered medications on file prior to visit.    No Known Allergies  Social History   Social History  . Marital Status: Single    Spouse Name: N/A  . Number of Children: N/A  . Years of Education: N/A   Occupational History  . Not on file.   Social History Main Topics  . Smoking status: Never Smoker   . Smokeless tobacco: Not on file  . Alcohol Use: No  . Drug Use: No  . Sexual Activity: Yes    Birth Control/ Protection: IUD   Other Topics Concern  . Not on file   Social History Narrative  Family History  Problem Relation Age of Onset  . Ovarian cancer Mother   . Breast cancer Mother   . Ovarian cancer Maternal Aunt   . Diabetes Maternal Aunt   . Hypertension Maternal Aunt   . Ovarian cancer Maternal Grandmother   . Breast cancer Maternal Grandmother   . Colon cancer Neg Hx     The following portions of the patient's history were reviewed and updated as appropriate: allergies, current medications, past family history, past medical history, past social history, past surgical history and problem list.  Review of Systems ROS Review of Systems - General ROS: negative for - chills, fatigue, fever, hot flashes, night sweats, weight gain or weight loss Psychological ROS: negative for - anxiety, decreased libido, depression, mood swings, physical abuse or sexual abuse Ophthalmic ROS: negative for - blurry vision, eye pain or loss of vision ENT ROS: negative for - headaches, hearing change, visual changes or vocal changes Allergy and Immunology ROS: negative for - hives, itchy/watery eyes or seasonal allergies Hematological and Lymphatic ROS: negative for - bleeding problems, bruising, swollen lymph nodes or weight loss Endocrine ROS:  negative for - galactorrhea, hair pattern changes, hot flashes, malaise/lethargy, mood swings, palpitations, polydipsia/polyuria, skin changes, temperature intolerance or unexpected weight changes Breast ROS: negative for - new or changing breast lumps or nipple discharge Respiratory ROS: negative for - cough or shortness of breath Cardiovascular ROS: negative for - chest pain, irregular heartbeat, palpitations or shortness of breath Gastrointestinal ROS: no abdominal pain, change in bowel habits, or black or bloody stools Genito-Urinary ROS: no dysuria, trouble voiding, or hematuria Musculoskeletal ROS: negative for - joint pain or joint stiffness Neurological ROS: negative for - bowel and bladder control changes Dermatological ROS: negative for rash and skin lesion changes   Objective:   BP 117/74 mmHg  Pulse 116  Ht 4\' 11"  (1.499 m)  Wt 170 lb 4.8 oz (77.248 kg)  BMI 34.38 kg/m2 CONSTITUTIONAL: Well-developed, well-nourished female in no acute distress.  PSYCHIATRIC: Normal mood and affect. Normal behavior. Normal judgment and thought content. [name]: Alert and oriented to person, place, and time. Normal muscle tone coordination. No cranial nerve deficit noted. HENT:  Normocephalic, atraumatic, External right and left ear normal. Oropharynx is clear and moist EYES: Conjunctivae and EOM are normal. Pupils are equal, round, and reactive to light. No scleral icterus.  NECK: Normal range of motion, supple, no masses.  Normal thyroid.  SKIN: Skin is warm and dry. No rash noted. Not diaphoretic. No erythema. No pallor. CARDIOVASCULAR: Normal heart rate noted, regular rhythm, no murmur. RESPIRATORY: Clear to auscultation bilaterally. Effort and breath sounds normal, no problems with respiration noted. BREASTS: Symmetric in size. No masses, skin changes, nipple drainage, or lymphadenopathy. ABDOMEN: Soft, normal bowel sounds, no distention noted.  No tenderness, rebound or guarding.   BLADDER: Normal PELVIC:  External Genitalia: Normal  BUS: Normal  Vagina: Normal  Cervix: Normal; IUD string 2 cm  Uterus: Normal; midplane, mobile, normal size and shape  Adnexa: Normal  RV: External Exam NormaI  MUSCULOSKELETAL: Normal range of motion. No tenderness.  No cyanosis, clubbing, or edema.  2+ distal pulses. LYMPHATIC: No Axillary, Supraclavicular, or Inguinal Adenopathy.    Assessment:   Annual gynecologic examination 30 y.o. Contraception: IUD bmi- 34 Migraine headaches, worsening, neurology concern about possible association with  Mirena IUD. Pseudotumor cerebri HIV, stable on anti-virals  Plan:  Pap: Not needed Mammogram: Not Indicated Stool Guaiac Testing:  Not Indicated Labs: thru pcp Routine preventative health  maintenance measures emphasized: Exercise/Diet/Weight control, Tobacco Warnings and Alcohol/Substance use risks Return as needed if IUD needs to come out per neurology recommendations Return to [place], CMA  Brayton Mars, MD  Note: This dictation was prepared with Dragon dictation along with smaller phrase technology. Any transcriptional errors that result from this process are unintentional.

## 2015-11-05 NOTE — Progress Notes (Signed)
GYN ENCOUNTER NOTE  Subjective:       Amber Key is a 30 y.o. [phone] female is here for gynecologic evaluation of the following issues:  1. Annual exam.     The patient had a Mirena placed in February 2013 and has not followed up since. She began having migraines over one year ago and was diagnosed with pseudotumor cerebri around that time. She is followed by Dr. Laurance Flatten at [facility] who believes her condition may be a result of the Mirena. She is contemplating having the IUD removed. In regards to menstruation, the patient has not had a cycle since having the Mirena. She experiences spotting once every 5-6 months which lasts 1-2 days. She also occasionally has cramps. The patient has chronic constipation which is being managed by her GI specialist, Dr. Prescott Parma.   Gynecologic History No LMP recorded. Patient is not currently having periods (Reason: IUD). Contraception: IUD Last Pap: Normal (July 2016) Last mammogram: NA  Obstetric History OB History  Gravida Para Term Preterm AB SAB TAB Ectopic Multiple Living  6 1 1  5 4  1  1     # Outcome Date GA Lbr Len/2nd Weight Sex Delivery Anes PTL Lv  6 Term 2007   5 lb 2.4 oz (2.336 kg) F Vag-Spont   Y  5 SAB           4 SAB           3 SAB           2 SAB           1 Ectopic               Past Medical History  Diagnosis Date  . Enlarged lymph nodes unk  . Pseudotumor cerebri unk  . HIV (human immunodeficiency virus infection) ([place])     diagnosed years ago at [facility]  . Lymphoma ([place])   . Migraines   . Vision loss   . Memory loss     Past Surgical History  Procedure Laterality Date  . Colonoscopy    . Upper gi endoscopy    . Wisdom teeth    . Lumbar puncture      Current Outpatient Prescriptions on File Prior to Visit  Medication Sig Dispense Refill  . Abacavir-Dolutegravir-Lamivud (TRIUMEQ) [id number] MG TABS Take by mouth.    Marland Kitchen acetaZOLAMIDE (DIAMOX) 500 MG capsule Take 500 mg by mouth 2 (two) times daily.    Marland Kitchen  desipramine (NORPRAMIN) 50 MG tablet Take 50 mg by mouth daily.    . diclofenac (VOLTAREN) 75 MG EC tablet Take 1 tablet (75 mg total) by mouth 2 (two) times daily. 30 tablet 0  . naproxen (NAPROSYN) 500 MG tablet Take 500 mg by mouth 2 (two) times daily with a meal.    . ondansetron (ZOFRAN) 8 MG tablet Take by mouth every 8 (eight) hours as needed for nausea or vomiting.    . promethazine (PHENERGAN) 25 MG tablet Take 25 mg by mouth every 6 (six) hours as needed for nausea or vomiting.    . SUMAtriptan (IMITREX) 50 MG tablet Take 50 mg by mouth every 2 (two) hours as needed for migraine. May repeat in 2 hours if headache persists or recurs.     No current facility-administered medications on file prior to visit.    No Known Allergies  Social History   Social History  . Marital Status: Single    Spouse Name: N/A  . Number of  Children: N/A  . Years of Education: N/A   Occupational History  . Not on file.   Social History Main Topics  . Smoking status: Never Smoker   . Smokeless tobacco: Not on file  . Alcohol Use: No  . Drug Use: No  . Sexual Activity: Yes    Birth Control/ Protection: IUD   Other Topics Concern  . Not on file   Social History Narrative    Family History  Problem Relation Age of Onset  . Ovarian cancer Mother   . Breast cancer Mother   . Ovarian cancer Maternal Aunt   . Diabetes Maternal Aunt   . Hypertension Maternal Aunt   . Ovarian cancer Maternal Grandmother   . Breast cancer Maternal Grandmother   . Colon cancer Neg Hx     The following portions of the patient's history were reviewed and updated as appropriate: allergies, current medications, past family history, past medical history, past social history, past surgical history and problem list.  Review of Systems  Gastrointestinal ROS: Positive for chronic constipation. negative for - abdominal pain, blood in stools, change in bowel habits and nausea/vomiting Genito-Urinary ROS: negative for  - change in menstrual cycle, dysmenorrhea, dyspareunia, dysuria, genital discharge, genital ulcers, hematuria, incontinence, irregular/heavy menses, nocturia or pelvic pain  Objective:   BP 117/74 mmHg  Pulse 116  Ht 4\' 11"  (1.499 m)  Wt 170 lb 4.8 oz (77.248 kg)  BMI 34.38 kg/m2 CONSTITUTIONAL: Well-developed, well-nourished female in no acute distress.  HENT:  Normocephalic, atraumatic.  NECK: Normal range of motion, supple, no masses.  Normal thyroid.  SKIN: Skin is warm and dry. No rash noted. Not diaphoretic. No erythema. No pallor. [place]: Alert and oriented to person, place, and time. PSYCHIATRIC: Normal mood and affect. Normal behavior. Normal judgment and thought content. CARDIOVASCULAR:regular rhythm without murmur RESPIRATORY: clear BREASTS: no dominant mass, adenopathy or nipple discharge ABDOMEN: Soft, non distended; Non tender.  No Organomegaly. PELVIC:  External Genitalia: Normal  BUS: Normal  Vagina: Normal  Cervix: Normal; IUD string 2 cm  Uterus: Normal size, shape,consistency, mobile  Adnexa: Normal  RV: Normal   Bladder: Nontender      Assessment:   1.  Annual exam, age 80. 2.  Contraception-Mirena IUD 3.  History of pseudotumor cerebri; chronic migraine headaches, worsening over past year; neurology concerned regarding IUD association with headaches; patient contemplating removal.. 4.  HIV positive, on antivirals 5.  Increased BMI   Plan:  1.  No Pap smear due to normal Pap in July 2016. 2.  Contraception-Mirena IUD,, to be continued.  Patient will discuss with neurology regarding possible need for removal due to possible association with migraine headaches. 3.  Healthy eating and exercise encouraged; slow steady weight loss recommended. 4.  Return if IUD needs removal.    Loni Muse PA-S Brayton Mars, MD   I have seen, interviewed, and examined the patient in conjunction with the [facility]A. student and affirm the  diagnosis and management plan. Pryor Guettler A. Adriene Padula, MD, FACOG   Note: This dictation was prepared with Dragon dictation along with smaller phrase technology. Any transcriptional errors that result from this process are unintentional.

## 2015-12-15 ENCOUNTER — Emergency Department
Admission: EM | Admit: 2015-12-15 | Discharge: 2015-12-15 | Disposition: A | Discharge status: Home / Self Care | Payer: Medicaid Other | Attending: Emergency Medicine | Admitting: Emergency Medicine

## 2015-12-15 ENCOUNTER — Encounter: Payer: Self-pay | Admitting: Medical Oncology

## 2015-12-15 DIAGNOSIS — R110 Nausea: Secondary | ICD-10-CM | POA: Insufficient documentation

## 2015-12-15 DIAGNOSIS — Z79899 Other long term (current) drug therapy: Secondary | ICD-10-CM | POA: Diagnosis not present

## 2015-12-15 DIAGNOSIS — Z791 Long term (current) use of non-steroidal anti-inflammatories (NSAID): Secondary | ICD-10-CM | POA: Insufficient documentation

## 2015-12-15 DIAGNOSIS — J0100 Acute maxillary sinusitis, unspecified: Secondary | ICD-10-CM | POA: Diagnosis not present

## 2015-12-15 DIAGNOSIS — R0981 Nasal congestion: Secondary | ICD-10-CM | POA: Diagnosis present

## 2015-12-15 MED ORDER — AMOXICILLIN 500 MG PO CAPS: 500.0000 mg | ORAL_CAPSULE | Freq: Three times a day (TID) | ORAL | Status: DC

## 2015-12-15 MED ORDER — PSEUDOEPH-BROMPHEN-DM 30-2-10 MG/5ML PO SYRP: 5.0000 mL | ORAL_SOLUTION | Freq: Four times a day (QID) | ORAL | Status: DC | PRN

## 2015-12-15 MED ORDER — KETOROLAC TROMETHAMINE 60 MG/2ML IM SOLN: 30.0000 mg | Freq: Once | INTRAMUSCULAR | Status: DC

## 2015-12-15 MED ORDER — KETOROLAC TROMETHAMINE 30 MG/ML IJ SOLN
INTRAMUSCULAR | Status: AC
  Administered 2015-12-15: 30 mg
  Filled 2015-12-15: qty 1

## 2015-12-15 MED ORDER — IBUPROFEN 800 MG PO TABS: 800.0000 mg | ORAL_TABLET | Freq: Three times a day (TID) | ORAL | Status: DC | PRN

## 2015-12-15 NOTE — ED Provider Notes (Signed)
[facility] Emergency Department Provider Note  ____________________________________________  Time seen: Approximately 11:51 AM  I have reviewed the triage vital signs and the nursing notes.   HISTORY  Chief Complaint Sore Throat and Nasal Congestion    HPI Amber Key is a 30 y.o. female patient states sore throat, body aches, headache, earache, cough, nasal and chest congestion for 5 days. Patient stated there is nausea but no vomiting or diarrhea. Patient stated no relief with over-the-counter anti-inflammatory medications. Patient rates her pain discomfort as a 6/10. Describes pain as pressure in the sinuses and "achy".Patient has taken a flu shot this season.   Past Medical History  Diagnosis Date  . Enlarged lymph nodes unk  . Pseudotumor cerebri unk  . HIV (human immunodeficiency virus infection) ([place])     diagnosed years ago at [facility]  . Lymphoma ([place])   . Migraines   . Vision loss   . Memory loss     Patient Active Problem List   Diagnosis Date Noted  . Adenopathy 02/11/2014  . Human immunodeficiency virus (HIV) infection ([place]) 02/10/2014  . Headache, migraine 12/13/2013  . Benign intracranial hypertension 12/13/2013    Past Surgical History  Procedure Laterality Date  . Colonoscopy    . Upper gi endoscopy    . Wisdom teeth    . Lumbar puncture      Current Outpatient Rx  Name  Route  Sig  Dispense  Refill  . Abacavir-Dolutegravir-Lamivud (TRIUMEQ) [id number] MG TABS   Oral   Take by mouth.         Marland Kitchen acetaZOLAMIDE (DIAMOX) 500 MG capsule   Oral   Take 500 mg by mouth 2 (two) times daily.         Marland Kitchen desipramine (NORPRAMIN) 50 MG tablet   Oral   Take 50 mg by mouth daily.         . diclofenac (VOLTAREN) 75 MG EC tablet   Oral   Take 1 tablet (75 mg total) by mouth 2 (two) times daily.   30 tablet   0   . naproxen (NAPROSYN) 500 MG tablet   Oral   Take 500 mg by mouth 2 (two) times daily with a meal.         .  ondansetron (ZOFRAN) 8 MG tablet   Oral   Take by mouth every 8 (eight) hours as needed for nausea or vomiting.         . promethazine (PHENERGAN) 25 MG tablet   Oral   Take 25 mg by mouth every 6 (six) hours as needed for nausea or vomiting.         . SUMAtriptan (IMITREX) 50 MG tablet   Oral   Take 50 mg by mouth every 2 (two) hours as needed for migraine. May repeat in 2 hours if headache persists or recurs.           Allergies Review of patient's allergies indicates no known allergies.  Family History  Problem Relation Age of Onset  . Ovarian cancer Mother   . Breast cancer Mother   . Ovarian cancer Maternal Aunt   . Diabetes Maternal Aunt   . Hypertension Maternal Aunt   . Ovarian cancer Maternal Grandmother   . Breast cancer Maternal Grandmother   . Colon cancer Neg Hx     Social History Social History  Substance Use Topics  . Smoking status: Never Smoker   . Smokeless tobacco: None  . Alcohol Use: No  Review of Systems Constitutional: No fever/chills Eyes: No visual changes. ENT: Sore throat, ear pressure,  Cardiovascular: Denies chest pain. Respiratory: Denies shortness of breath. Nonproductive cough Gastrointestinal: No abdominal pain. Nausea without vomiting.  No diarrhea.  No constipation. Genitourinary: Negative for dysuria. Musculoskeletal: Negative for back pain. Skin: Negative for rash. Neurological: Positive for frontal headaches, but denies focal weakness or numbness. Hematological/Lymphatic: Allergic/Immunilogical: HIV  10-point ROS otherwise negative.  ____________________________________________   PHYSICAL EXAM:  VITAL SIGNS: ED Triage Vitals  Enc Vitals Group     BP 12/15/15 1127 111/83 mmHg     Pulse Rate 12/15/15 1127 89     Resp 12/15/15 1127 17     Temp 12/15/15 1127 97.9 F (36.6 C)     Temp Source 12/15/15 1127 Oral     SpO2 12/15/15 1127 99 %     Weight 12/15/15 1127 165 lb (74.844 kg)     Height 12/15/15 1127 4'  11" (1.499 m)     Head Cir --      Peak Flow --      Pain Score 12/15/15 1128 6     Pain Loc --      Pain Edu? --      Excl. in [place]? --     Constitutional: Alert and oriented. Well appearing and in no acute distress. Eyes: Conjunctivae are normal. PERRL. EOMI. Head: Atraumatic. Nose: Bilateral maxillary guarding edematous bilateral nasal turbinates. Thick rhinorrhea. Mouth/Throat: Mucous membranes are moist.  Oropharynx erythematous. Copious postnasal drainage. Neck: No stridor.  No cervical spine tenderness to palpation. Hematological/Lymphatic/Immunilogical: No cervical lymphadenopathy. Cardiovascular: Normal rate, regular rhythm. Grossly normal heart sounds.  Good peripheral circulation. Respiratory: Normal respiratory effort.  No retractions. Lungs CTAB. Gastrointestinal: Soft and nontender. No distention. No abdominal bruits. No CVA tenderness. Musculoskeletal: No lower extremity tenderness nor edema.  No joint effusions. Neurologic:  Normal speech and language. No gross focal neurologic deficits are appreciated. No gait instability. Skin:  Skin is warm, dry and intact. No rash noted. Psychiatric: Mood and affect are normal. Speech and behavior are normal.  ____________________________________________   LABS (all labs ordered are listed, but only abnormal results are displayed)  Labs Reviewed - No data to display ____________________________________________  EKG   ____________________________________________  RADIOLOGY   ____________________________________________   PROCEDURES  Procedure(s) performed: None  Critical Care performed: No  ____________________________________________   INITIAL IMPRESSION / ASSESSMENT AND PLAN / ED COURSE  Pertinent labs & imaging results that were available during my care of the patient were reviewed by me and considered in my medical decision making (see chart for details).   Sinusitis. Patient given discharge Instructions.  Patient given a prescription for amoxicillin, Bromfed-DM, and ibuprofen. [facility]. ____________________________________________   FINAL CLINICAL IMPRESSION(S) / ED DIAGNOSES  Final diagnoses:  Acute maxillary sinusitis, recurrence not specified      Sable Feil, PA-C 12/15/15 [place], MD 12/15/15 [phone]

## 2015-12-15 NOTE — Discharge Instructions (Signed)
Sinusitis, Adult  Sinusitis is redness, soreness, and puffiness (inflammation) of the air pockets in the bones of your face (sinuses). The redness, soreness, and puffiness can cause air and mucus to get trapped in your sinuses. This can allow germs to grow and cause an infection.   HOME CARE    Drink enough fluids to keep your pee (urine) clear or pale yellow.   Use a humidifier in your home.   Run a hot shower to create steam in the bathroom. Sit in the bathroom with the door closed. Breathe in the steam 3-4 times a day.   Put a warm, moist washcloth on your face 3-4 times a day, or as told by your doctor.   Use salt water sprays (saline sprays) to wet the thick fluid in your nose. This can help the sinuses drain.   Only take medicine as told by your doctor.  GET HELP RIGHT AWAY IF:    Your pain gets worse.   You have very bad headaches.   You are sick to your stomach (nauseous).   You throw up (vomit).   You are very sleepy (drowsy) all the time.   Your face is puffy (swollen).   Your vision changes.   You have a stiff neck.   You have trouble breathing.  MAKE SURE YOU:    Understand these instructions.   Will watch your condition.   Will get help right away if you are not doing well or get worse.     This information is not intended to replace advice given to you by your health care provider. Make sure you discuss any questions you have with your health care provider.     Document Released: 03/14/2008 Document Revised: 10/17/2014 Document Reviewed: 05/01/2012  Elsevier Interactive Patient Education 2016 Elsevier Inc.

## 2015-12-15 NOTE — ED Notes (Signed)
Pt reports sore throat, body aches, headaches, ear aches, cough, chest congestion since thurs.

## 2016-03-23 ENCOUNTER — Emergency Department
Admission: EM | Admit: 2016-03-23 | Discharge: 2016-03-23 | Disposition: A | Discharge status: Home / Self Care | Payer: No Typology Code available for payment source

## 2016-05-03 ENCOUNTER — Emergency Department
Admission: EM | Admit: 2016-05-03 | Discharge: 2016-05-03 | Disposition: A | Discharge status: Home / Self Care | Payer: Medicaid Other | Attending: Emergency Medicine | Admitting: Emergency Medicine

## 2016-05-03 ENCOUNTER — Encounter: Payer: Self-pay | Admitting: Emergency Medicine

## 2016-05-03 DIAGNOSIS — Z79899 Other long term (current) drug therapy: Secondary | ICD-10-CM | POA: Diagnosis not present

## 2016-05-03 DIAGNOSIS — Z21 Asymptomatic human immunodeficiency virus [HIV] infection status: Secondary | ICD-10-CM | POA: Diagnosis not present

## 2016-05-03 DIAGNOSIS — J029 Acute pharyngitis, unspecified: Secondary | ICD-10-CM | POA: Diagnosis present

## 2016-05-03 DIAGNOSIS — J309 Allergic rhinitis, unspecified: Secondary | ICD-10-CM | POA: Insufficient documentation

## 2016-05-03 DIAGNOSIS — Z8572 Personal history of non-Hodgkin lymphomas: Secondary | ICD-10-CM | POA: Diagnosis not present

## 2016-05-03 LAB — POCT RAPID STREP A: Streptococcus, Group A Screen (Direct): NEGATIVE

## 2016-05-03 MED ORDER — LORATADINE 10 MG PO TABS: 10.0000 mg | ORAL_TABLET | Freq: Every day | ORAL | 0 refills | Status: DC

## 2016-05-03 NOTE — ED Notes (Signed)
States she developed sore throat this am  No fever or diff swallowing

## 2016-05-03 NOTE — ED Provider Notes (Signed)
[facility] Emergency Department Provider Note  ____________________________________________  Time seen: Approximately 12:33 PM  I have reviewed the triage vital signs and the nursing notes.   HISTORY  Chief Complaint Sore Throat    HPI Amber Key is a 30 y.o. female , NAD, presents to the emergency department with several hour history of sore throat. Patient states she has chronic allergies and has consistent nasal congestion, runny nose and sneezing. No sore throat this morning which she woke up. Normally takes Benadryl for her allergies but ran out over a week ago it has not purchased any more to take. Denies any fevers, chills, body aches. Has not had any abdominal pain, nausea, vomiting. Denies cough, chest congestion, chest pain, shortness of breath nor wheezing. No known sick contacts. Has not had any headache nor rashes.   Past Medical History:  Diagnosis Date  . Enlarged lymph nodes unk  . HIV (human immunodeficiency virus infection) ([place])    diagnosed years ago at [facility]  . Lymphoma ([place])   . Memory loss   . Migraines   . Pseudotumor cerebri unk  . Vision loss     Patient Active Problem List   Diagnosis Date Noted  . Adenopathy 02/11/2014  . Human immunodeficiency virus (HIV) infection ([place]) 02/10/2014  . Headache, migraine 12/13/2013  . Benign intracranial hypertension 12/13/2013    Past Surgical History:  Procedure Laterality Date  . COLONOSCOPY    . LUMBAR PUNCTURE    . UPPER GI ENDOSCOPY    . wisdom teeth      Current Outpatient Rx  . Order #: [id number] Class: Historical Med  . Order #: [id number] Class: Historical Med  . Order #: [id number] Class: Historical Med  . Order #: [id number] Class: Print  . Order #: [id number] Class: Historical Med  . Order #: [id number] Class: Historical Med  . Order #: [id number] Class: Historical Med  . Order #: [id number] Class: Historical Med    Allergies Review of patient's allergies indicates no known  allergies.  Family History  Problem Relation Age of Onset  . Ovarian cancer Mother   . Breast cancer Mother   . Ovarian cancer Maternal Aunt   . Diabetes Maternal Aunt   . Hypertension Maternal Aunt   . Ovarian cancer Maternal Grandmother   . Breast cancer Maternal Grandmother   . Colon cancer Neg Hx     Social History Social History  Substance Use Topics  . Smoking status: Never Smoker  . Smokeless tobacco: Never Used  . Alcohol use No     Review of Systems  Constitutional: No fever/chills, Fatigue Eyes: No visual changes. No discharge, Redness, pain ENT: Positive sore throat, runny nose, nasal congestion, sneezing. No sinus pressure, ear pain, drainage from ears Cardiovascular: No chest pain. Respiratory: No cough, chest congestion. No shortness of breath. No wheezing.  Gastrointestinal: No abdominal pain.  No nausea, vomiting.   Musculoskeletal: Negative for general myalgias.  Skin: Negative for rash. Neurological: Negative for headaches, focal weakness or numbness. 10-point ROS otherwise negative.  ____________________________________________   PHYSICAL EXAM:  VITAL SIGNS: ED Triage Vitals [05/03/16 1148]  Enc Vitals Group     BP 120/82     Pulse Rate (!) 105     Resp 18     Temp 98.4 F (36.9 C)     Temp Source Oral     SpO2 100 %     Weight 164 lb (74.4 kg)     Height 4\' 11"  (1.499 m)     Head Circumference  Peak Flow      Pain Score 5     Pain Loc      Pain Edu?      Excl. in [place]?      Constitutional: Alert and oriented. Well appearing and in no acute distress. Eyes: Conjunctivae are normal.  Head: Atraumatic. ENT:      Ears: TMs visualized bilaterally with mild serous effusion but no bulging, erythema, perforation.      Nose: Mild congestion with trace clear rhinnorhea. Bilateral turbinates are injected.      Mouth/Throat: Mucous membranes are moist. Clear postnasal drip. Cobblestoning of posterior pharynx noted. No pharyngeal erythema,  swelling, exudates. Neck: Supple with full range of motion Hematological/Lymphatic/Immunilogical: No cervical lymphadenopathy. Cardiovascular: Normal rate, regular rhythm. Normal S1 and S2.  Good peripheral circulation. Respiratory: Normal respiratory effort without tachypnea or retractions. Lungs CTAB with breath sounds noted in all lung fields. Neurologic:  Normal speech and language. No gross focal neurologic deficits are appreciated.  Skin:  Skin is warm, dry and intact. No rash noted. Psychiatric: Mood and affect are normal. Speech and behavior are normal. Patient exhibits appropriate insight and judgement.   ____________________________________________   LABS (all labs ordered are listed, but only abnormal results are displayed)  Labs Reviewed  POCT RAPID STREP A   ____________________________________________  EKG  None ____________________________________________  RADIOLOGY  None ____________________________________________    PROCEDURES  Procedure(s) performed: None      Medications - No data to display   ____________________________________________   INITIAL IMPRESSION / ASSESSMENT AND PLAN / ED COURSE  Pertinent labs & imaging results that were available during my care of the patient were reviewed by me and considered in my medical decision making (see chart for details).  Patient's diagnosis is consistent with Allergic rhinitis. Patient will be discharged home with prescriptions for loratadine to take as directed. Patient is to follow up with her primary care provider or [facility] if symptoms persist past this treatment course. Patient is given ED precautions to return to the ED for any worsening or new symptoms.      ____________________________________________  FINAL CLINICAL IMPRESSION(S) / ED DIAGNOSES  Final diagnoses:  Allergic rhinitis, unspecified allergic rhinitis type      NEW MEDICATIONS STARTED DURING THIS  VISIT:  Discharge Medication List as of 05/03/2016 12:32 PM    START taking these medications   Details  loratadine (CLARITIN) 10 MG tablet Take 1 tablet (10 mg total) by mouth daily., Starting Tue 05/03/2016, Print             Braxton Feathers, PA-C 05/03/16 1640    Harvest Dark, MD 05/04/16 2011

## 2016-05-03 NOTE — ED Triage Notes (Signed)
Pt with sore throat started today. No resp distress.

## 2016-05-23 ENCOUNTER — Emergency Department
Admission: EM | Admit: 2016-05-23 | Discharge: 2016-05-23 | Disposition: A | Discharge status: Home / Self Care | Payer: Medicaid Other | Attending: Emergency Medicine | Admitting: Emergency Medicine

## 2016-05-23 DIAGNOSIS — M79675 Pain in left toe(s): Secondary | ICD-10-CM | POA: Insufficient documentation

## 2016-05-23 DIAGNOSIS — Z21 Asymptomatic human immunodeficiency virus [HIV] infection status: Secondary | ICD-10-CM | POA: Diagnosis not present

## 2016-05-23 DIAGNOSIS — Z5321 Procedure and treatment not carried out due to patient leaving prior to being seen by health care provider: Secondary | ICD-10-CM | POA: Diagnosis not present

## 2016-05-23 NOTE — ED Notes (Signed)
Patient called for a room at 4:15 with no response.  Called patient on mobile phone and she stated that she had to get a child from a babysitter but she was going to return immediately to the ED to be seen.  This RN waited for 30 minutes and patient did not return.

## 2016-05-23 NOTE — ED Triage Notes (Signed)
Patient reports pain in her left pinky toe x 2 weeks.

## 2016-07-02 ENCOUNTER — Encounter: Payer: Self-pay | Admitting: Urgent Care

## 2016-07-02 ENCOUNTER — Emergency Department: Payer: Medicaid Other

## 2016-07-02 ENCOUNTER — Emergency Department
Admission: EM | Admit: 2016-07-02 | Discharge: 2016-07-03 | Disposition: A | Discharge status: Home / Self Care | Payer: Medicaid Other | Attending: Emergency Medicine | Admitting: Emergency Medicine

## 2016-07-02 DIAGNOSIS — R51 Headache: Secondary | ICD-10-CM | POA: Diagnosis present

## 2016-07-02 DIAGNOSIS — M25562 Pain in left knee: Secondary | ICD-10-CM | POA: Insufficient documentation

## 2016-07-02 DIAGNOSIS — Z8572 Personal history of non-Hodgkin lymphomas: Secondary | ICD-10-CM | POA: Insufficient documentation

## 2016-07-02 DIAGNOSIS — Z21 Asymptomatic human immunodeficiency virus [HIV] infection status: Secondary | ICD-10-CM | POA: Insufficient documentation

## 2016-07-02 DIAGNOSIS — W19XXXA Unspecified fall, initial encounter: Secondary | ICD-10-CM | POA: Diagnosis not present

## 2016-07-02 DIAGNOSIS — Y939 Activity, unspecified: Secondary | ICD-10-CM | POA: Diagnosis not present

## 2016-07-02 DIAGNOSIS — R519 Headache, unspecified: Secondary | ICD-10-CM

## 2016-07-02 DIAGNOSIS — Z79899 Other long term (current) drug therapy: Secondary | ICD-10-CM | POA: Diagnosis not present

## 2016-07-02 DIAGNOSIS — Y929 Unspecified place or not applicable: Secondary | ICD-10-CM | POA: Diagnosis not present

## 2016-07-02 DIAGNOSIS — Y999 Unspecified external cause status: Secondary | ICD-10-CM | POA: Insufficient documentation

## 2016-07-02 MED ORDER — INDOMETHACIN 50 MG PO CAPS
75.0000 mg | ORAL_CAPSULE | Freq: Once | ORAL | Status: AC
  Administered 2016-07-03: 75 mg via ORAL
  Filled 2016-07-02: qty 1

## 2016-07-02 MED ORDER — LIDOCAINE HCL (PF) 1 % IJ SOLN
INTRAMUSCULAR | Status: AC
  Filled 2016-07-02: qty 15

## 2016-07-02 MED ORDER — PROCHLORPERAZINE EDISYLATE 5 MG/ML IJ SOLN
10.0000 mg | Freq: Once | INTRAMUSCULAR | Status: AC
  Administered 2016-07-02: 10 mg via INTRAVENOUS
  Filled 2016-07-02: qty 2

## 2016-07-02 MED ORDER — LIDOCAINE HCL (PF) 1 % IJ SOLN
5.0000 mL | Freq: Once | INTRAMUSCULAR | Status: DC
  Filled 2016-07-02: qty 5

## 2016-07-02 MED ORDER — INDOMETHACIN SODIUM 1 MG IV SOLR
50.0000 mg | Freq: Once | INTRAVENOUS | Status: DC
Start: 2016-07-02 — End: 2016-07-02

## 2016-07-02 NOTE — ED Provider Notes (Signed)
[facility] Emergency Department Provider Note   ____________________________________________   I have reviewed the triage vital signs and the nursing notes.   HISTORY  Chief Complaint Fall; Migraine; Knee Pain; and Toe Pain   History limited by: Not Limited   HPI Amber Key is a 30 y.o. female with history of pseudotumor cerebri who presents to the emergency department today because of concerns for headache and left knee pain. The patient states that she has had the headache for the past 3 days. She states that her home medications are no longer controlling it. In addition the patient states that she did fall and land on her left knee. The patient is followed by neurology at [facility]. Apparently there is discussion about placing a shunt. Patient is also had some associated blurry vision. Denies any fevers.   Past Medical History:  Diagnosis Date  . Enlarged lymph nodes unk  . HIV (human immunodeficiency virus infection) (HCC)    diagnosed years ago at [facility]  . Lymphoma (HCC)   . Memory loss   . Migraines   . Pseudotumor cerebri unk  . Vision loss     Patient Active Problem List   Diagnosis Date Noted  . Adenopathy 02/11/2014  . Human immunodeficiency virus (HIV) infection (HCC) 02/10/2014  . Headache, migraine 12/13/2013  . Benign intracranial hypertension 12/13/2013    Past Surgical History:  Procedure Laterality Date  . COLONOSCOPY    . LUMBAR PUNCTURE    . UPPER GI ENDOSCOPY    . wisdom teeth      Prior to Admission medications   Medication Sig Start Date End Date Taking? Authorizing Provider  Abacavir-Dolutegravir-Lamivud (TRIUMEQ) 600-50-300 MG TABS Take by mouth. 02/26/14   Historical Provider, MD  acetaZOLAMIDE (DIAMOX) 500 MG capsule Take 500 mg by mouth 2 (two) times daily.    Historical Provider, MD  desipramine (NORPRAMIN) 50 MG tablet Take 50 mg by mouth daily.    Historical Provider, MD  loratadine (CLARITIN) 10 MG tablet Take 1  tablet (10 mg total) by mouth daily. 05/03/16   Jami L Hagler, PA-C  naproxen (NAPROSYN) 500 MG tablet Take 500 mg by mouth 2 (two) times daily with a meal.    Historical Provider, MD  ondansetron (ZOFRAN) 8 MG tablet Take by mouth every 8 (eight) hours as needed for nausea or vomiting.    Historical Provider, MD  promethazine (PHENERGAN) 25 MG tablet Take 25 mg by mouth every 6 (six) hours as needed for nausea or vomiting.    Historical Provider, MD  SUMAtriptan (IMITREX) 50 MG tablet Take 50 mg by mouth every 2 (two) hours as needed for migraine. May repeat in 2 hours if headache persists or recurs.    Historical Provider, MD    Allergies Review of patient's allergies indicates no known allergies.  Family History  Problem Relation Age of Onset  . Ovarian cancer Mother   . Breast cancer Mother   . Ovarian cancer Maternal Aunt   . Diabetes Maternal Aunt   . Hypertension Maternal Aunt   . Ovarian cancer Maternal Grandmother   . Breast cancer Maternal Grandmother   . Colon cancer Neg Hx     Social History Social History  Substance Use Topics  . Smoking status: Never Smoker  . Smokeless tobacco: Never Used  . Alcohol use No    Review of Systems  Constitutional: Negative for fever. Cardiovascular: Negative for chest pain. Respiratory: Negative for shortness of breath. Gastrointestinal: Negative for abdominal pain,  vomiting and diarrhea. Genitourinary: Negative for dysuria. Musculoskeletal: Negative for back pain. Skin: Negative for rash. Neurological: Positive for headache.  10-point ROS otherwise negative.  ____________________________________________   PHYSICAL EXAM:  VITAL SIGNS: ED Triage Vitals  Enc Vitals Group     BP 07/02/16 2049 117/76     Pulse Rate 07/02/16 2049 90     Resp 07/02/16 2049 18     Temp 07/02/16 2049 98.2 F (36.8 C)     Temp Source 07/02/16 2049 Oral     SpO2 07/02/16 2049 100 %     Weight 07/02/16 2049 160 lb (72.6 kg)     Height  07/02/16 2049 4\' 11"  (1.499 m)     Head Circumference --      Peak Flow --      Pain Score 07/02/16 2109 8   Constitutional: Alert and oriented. Well appearing and in no distress. Eyes: Conjunctivae are normal. Normal extraocular movements. ENT   Head: Normocephalic and atraumatic.   Nose: No congestion/rhinnorhea.   Mouth/Throat: Mucous membranes are moist.   Neck: No stridor. Hematological/Lymphatic/Immunilogical: No cervical lymphadenopathy. Cardiovascular: Normal rate, regular rhythm.  No murmurs, rubs, or gallops. Respiratory: Normal respiratory effort without tachypnea nor retractions. Breath sounds are clear and equal bilaterally. No wheezes/rales/rhonchi. Gastrointestinal: Soft and nontender. No distention.  Genitourinary: Deferred Musculoskeletal: Normal range of motion in all extremities. No lower extremity edema. Neurologic:  Normal speech and language. No gross focal neurologic deficits are appreciated.  Skin:  Skin is warm, dry and intact. No rash noted. Psychiatric: Mood and affect are normal. Speech and behavior are normal. Patient exhibits appropriate insight and judgment.  ____________________________________________    LABS (pertinent positives/negatives)  Labs Reviewed - No data to display   ____________________________________________   EKG  None  ____________________________________________    RADIOLOGY  None  ____________________________________________   PROCEDURES  Procedures  ____________________________________________   INITIAL IMPRESSION / ASSESSMENT AND PLAN / ED COURSE  Pertinent labs & imaging results that were available during my care of the patient were reviewed by me and considered in my medical decision making (see chart for details).  Patient presents to the emergency department today because of concerns for headache for 3 days and left knee pain. Left knee x-ray negative. Patient's headache is potentially  secondary to pseudotumor cerebri. I did discuss possibility of LP however did discuss with patient likelihood of return of elevated pressure shortly after. Patient is okay with deferring LP at this time. Will try medication to help relieve headache.   ____________________________________________   FINAL CLINICAL IMPRESSION(S) / ED DIAGNOSES  Final diagnoses:  Headache, unspecified headache type     Note: This dictation was prepared with Dragon dictation. Any transcriptional errors that result from this process are unintentional    Nance Pear, MD 07/03/16 1534

## 2016-07-02 NOTE — ED Triage Notes (Signed)
Patient presents to the ED with c/o LEFT knee pain and LEFT great toe pain s/p fall. Patient with chronic migraines secondary to pseudotumor cerebri; taking medications and they are not working. Patient has had an intractable migraine, with (+) visual changes,  x 3 days despite prescribed interventions.

## 2016-07-03 MED ORDER — MORPHINE SULFATE (PF) 2 MG/ML IV SOLN
2.0000 mg | Freq: Once | INTRAVENOUS | Status: AC
  Administered 2016-07-03: 2 mg via INTRAVENOUS
  Filled 2016-07-03: qty 1

## 2016-07-03 MED ORDER — KETOROLAC TROMETHAMINE 30 MG/ML IJ SOLN
30.0000 mg | Freq: Once | INTRAMUSCULAR | Status: AC
  Administered 2016-07-03: 30 mg via INTRAVENOUS
  Filled 2016-07-03: qty 1

## 2016-07-03 MED ORDER — PROMETHAZINE HCL 25 MG/ML IJ SOLN
12.5000 mg | Freq: Once | INTRAMUSCULAR | Status: AC
  Administered 2016-07-03: 12.5 mg via INTRAVENOUS
  Filled 2016-07-03: qty 1

## 2016-07-03 MED ORDER — INDOMETHACIN ER 75 MG PO CPCR: 75.0000 mg | ORAL_CAPSULE | Freq: Every day | ORAL | 0 refills | Status: DC

## 2016-07-03 MED ORDER — INDOMETHACIN ER 75 MG PO CPCR
75.0000 mg | ORAL_CAPSULE | Freq: Every day | ORAL | 0 refills | Status: DC
Start: 2016-07-03 — End: 2016-07-03

## 2016-07-03 NOTE — ED Notes (Signed)
Pt. Going home with husband. 

## 2016-07-03 NOTE — ED Provider Notes (Signed)
I assumed care patient from Dr. Archie Balboa at midnight area patient with history of pseudotumor cerebri with generalize headache. Patient denies any weakness or numbness or gait instability. On my arrival to the room to evaluate the patient she was asleep. Patient states that this time that her headache is still severe. As such patient received IV Phenergan and morphine. On reassessment patient asleep stating that pain had improved   Gregor Hams, MD 07/03/16 [phone]

## 2016-07-03 NOTE — Discharge Instructions (Signed)
Please seek medical attention for any high fevers, chest pain, shortness of breath, change in behavior, persistent vomiting, bloody stool or any other new or concerning symptoms.  

## 2016-10-16 ENCOUNTER — Emergency Department
Admission: EM | Admit: 2016-10-16 | Discharge: 2016-10-16 | Disposition: A | Discharge status: Home / Self Care | Payer: Medicaid Other | Attending: Emergency Medicine | Admitting: Emergency Medicine

## 2016-10-16 DIAGNOSIS — Z79899 Other long term (current) drug therapy: Secondary | ICD-10-CM | POA: Diagnosis not present

## 2016-10-16 DIAGNOSIS — G43709 Chronic migraine without aura, not intractable, without status migrainosus: Secondary | ICD-10-CM | POA: Diagnosis not present

## 2016-10-16 DIAGNOSIS — G43909 Migraine, unspecified, not intractable, without status migrainosus: Secondary | ICD-10-CM | POA: Diagnosis present

## 2016-10-16 DIAGNOSIS — Z21 Asymptomatic human immunodeficiency virus [HIV] infection status: Secondary | ICD-10-CM | POA: Diagnosis not present

## 2016-10-16 MED ORDER — DIPHENHYDRAMINE HCL 50 MG/ML IJ SOLN
25.0000 mg | Freq: Once | INTRAMUSCULAR | Status: AC
  Administered 2016-10-16: 25 mg via INTRAVENOUS
  Filled 2016-10-16: qty 1

## 2016-10-16 MED ORDER — METOCLOPRAMIDE HCL 5 MG/ML IJ SOLN
10.0000 mg | Freq: Once | INTRAMUSCULAR | Status: AC
  Administered 2016-10-16: 10 mg via INTRAVENOUS
  Filled 2016-10-16: qty 2

## 2016-10-16 MED ORDER — ORPHENADRINE CITRATE 30 MG/ML IJ SOLN
60.0000 mg | INTRAMUSCULAR | Status: AC
  Administered 2016-10-16: 60 mg via INTRAVENOUS
  Filled 2016-10-16: qty 2

## 2016-10-16 MED ORDER — SODIUM CHLORIDE 0.9 % IV BOLUS (SEPSIS)
500.0000 mL | Freq: Once | INTRAVENOUS | Status: AC
  Administered 2016-10-16: 500 mL via INTRAVENOUS

## 2016-10-16 MED ORDER — KETOROLAC TROMETHAMINE 30 MG/ML IJ SOLN
30.0000 mg | Freq: Once | INTRAMUSCULAR | Status: AC
  Administered 2016-10-16: 30 mg via INTRAVENOUS
  Filled 2016-10-16: qty 1

## 2016-10-16 NOTE — ED Triage Notes (Signed)
Pt states she has had a migraine for the last 3 weeks and has recurrent migraines - she is on medication that she states does not work any longer

## 2016-10-16 NOTE — Discharge Instructions (Signed)
Take your previously prescribed migraine medicine as directed. Follow-up with neurology for ongoing symptom management. Consider consultation with neurosurgery for possible shunt placement.

## 2016-10-16 NOTE — ED Provider Notes (Signed)
[facility] Emergency Department Provider Note ____________________________________________  Time seen: 1253  I have reviewed the triage vital signs and the nursing notes.  HISTORY  Chief Complaint  Migraine  HPI Amber Key is a 31 y.o. female presents to the ED for evaluation management of persistent migraine headache last 3 weeks. Patient has a history of chronic intermittent migraines and has migraine medicine on board. She admits to taking a daily preventive medication including Phenergan and naproxen. She also has taken a single dose of Imitrex 50 mg by mouth today. She describes her baseline headache that includes nausea, visual disturbance, and generalized headache pain. She denies any vomiting, syncope, or weakness. She reports previous ED visits have resolved her headache pain with a "migraine cocktail."  Past Medical History:  Diagnosis Date  . Enlarged lymph nodes unk  . HIV (human immunodeficiency virus infection) ([place])    diagnosed years ago at [facility]  . Lymphoma ([place])   . Memory loss   . Migraines   . Pseudotumor cerebri unk  . Vision loss     Patient Active Problem List   Diagnosis Date Noted  . Adenopathy 02/11/2014  . Human immunodeficiency virus (HIV) infection ([place]) 02/10/2014  . Headache, migraine 12/13/2013  . Benign intracranial hypertension 12/13/2013    Past Surgical History:  Procedure Laterality Date  . COLONOSCOPY    . LUMBAR PUNCTURE    . UPPER GI ENDOSCOPY    . wisdom teeth      Prior to Admission medications   Medication Sig Start Date End Date Taking? Authorizing Provider  Abacavir-Dolutegravir-Lamivud (TRIUMEQ) [id number] MG TABS Take by mouth. 02/26/14   Historical Provider, MD  acetaZOLAMIDE (DIAMOX) 500 MG capsule Take 500 mg by mouth 2 (two) times daily.    Historical Provider, MD  desipramine (NORPRAMIN) 50 MG tablet Take 50 mg by mouth daily.    Historical Provider, MD  indomethacin (INDOCIN SR) 75 MG CR  capsule Take 1 capsule (75 mg total) by mouth daily. 07/03/16   Gregor Hams, MD  loratadine (CLARITIN) 10 MG tablet Take 1 tablet (10 mg total) by mouth daily. 05/03/16   Jami L Hagler, PA-C  naproxen (NAPROSYN) 500 MG tablet Take 500 mg by mouth 2 (two) times daily with a meal.    Historical Provider, MD  ondansetron (ZOFRAN) 8 MG tablet Take by mouth every 8 (eight) hours as needed for nausea or vomiting.    Historical Provider, MD  promethazine (PHENERGAN) 25 MG tablet Take 25 mg by mouth every 6 (six) hours as needed for nausea or vomiting.    Historical Provider, MD  SUMAtriptan (IMITREX) 50 MG tablet Take 50 mg by mouth every 2 (two) hours as needed for migraine. May repeat in 2 hours if headache persists or recurs.    Historical Provider, MD    Allergies Peanut-containing drug products  Family History  Problem Relation Age of Onset  . Ovarian cancer Mother   . Breast cancer Mother   . Ovarian cancer Maternal Aunt   . Diabetes Maternal Aunt   . Hypertension Maternal Aunt   . Ovarian cancer Maternal Grandmother   . Breast cancer Maternal Grandmother   . Colon cancer Neg Hx     Social History Social History  Substance Use Topics  . Smoking status: Never Smoker  . Smokeless tobacco: Never Used  . Alcohol use No    Review of Systems  Constitutional: Negative for fever. Eyes: Negative for visual changes. ENT: Negative for sore throat.  Cardiovascular: Negative for chest pain. Respiratory: Negative for shortness of breath. Gastrointestinal: Negative for abdominal pain, vomiting and diarrhea. Reports nausea Musculoskeletal: Negative for back pain. Skin: Negative for rash. Neurological: Negative for focal weakness or numbness. Persistent headache as above.  ____________________________________________  PHYSICAL EXAM:  VITAL SIGNS: ED Triage Vitals  Enc Vitals Group     BP 10/16/16 1229 117/77     Pulse Rate 10/16/16 1229 83     Resp 10/16/16 1229 18     Temp  10/16/16 1229 98.6 F (37 C)     Temp Source 10/16/16 1229 Oral     SpO2 10/16/16 1229 100 %     Weight 10/16/16 1229 160 lb (72.6 kg)     Height 10/16/16 1229 4\' 11"  (1.499 m)     Head Circumference --      Peak Flow --      Pain Score 10/16/16 1233 8     Pain Loc --      Pain Edu? --      Excl. in [place]? --     Constitutional: Alert and oriented. Well appearing and in no distress. Head: Normocephalic and atraumatic. Eyes: Conjunctivae are normal. PERRL. Normal extraocular movements. Normal fundi bilaterally Ears: Canals clear. TMs intact bilaterally. Nose: No congestion/rhinorrhea/epistaxis. Mouth/Throat: Mucous membranes are moist. Cardiovascular: Normal rate, regular rhythm. Normal distal pulses. Respiratory: Normal respiratory effort. No wheezes/rales/rhonchi. Gastrointestinal: Soft and nontender. No distention. Musculoskeletal: Nontender with normal range of motion in all extremities.  Neurologic:  Normal gait without ataxia. Normal speech and language. No gross focal neurologic deficits are appreciated. Skin:  Skin is warm, dry and intact. No rash noted. Psychiatric: Mood and affect are normal. Patient exhibits appropriate insight and judgment. ____________________________________________  PROCEDURES  NS 500 ml bolus Benadryl 25 mg IVP Reglan 10 mg IVP Toradol 30 mg IVP Norflex 60 mg IVP ____________________________________________  INITIAL IMPRESSION / ASSESSMENT AND PLAN / ED COURSE  Patient with near-complete resolution following ED administration of IV medications. The patient is discharged to follow with her neurologist and consider a neurosurgery consultation for chest placement as previously planned with her specialist.  Clinical Course    ____________________________________________  FINAL CLINICAL IMPRESSION(S) / ED DIAGNOSES  Final diagnoses:  Chronic migraine without aura without status migrainosus, not intractable     Melvenia Needles,  PA-C 10/16/16 1720    Nance Pear, MD 10/17/16 1106

## 2017-01-03 IMAGING — CT CT ABD-PELV W/ CM
2 of 10 series · 15 of 37 positions shown, 18 images · IV contrast (APPLIED)
Comparison: Chest radiograph performed 08/04/2015, and CT of the
abdomen and pelvis from 07/10/2014

CLINICAL DATA: Acute onset of generalized abdominal pain and chest
pain. Severe constipation. Current history of lymphoma, on
chemotherapy. Initial encounter.

EXAM:
CT ANGIOGRAPHY CHEST
CT ABDOMEN AND PELVIS WITH CONTRAST
TECHNIQUE: Multidetector CT imaging of the chest was performed using the
standard protocol during bolus administration of intravenous
contrast. Multiplanar CT image reconstructions and MIPs were
obtained to evaluate the vascular anatomy. Multidetector CT imaging
of the abdomen and pelvis was performed using the standard protocol
during bolus administration of intravenous contrast.
CONTRAST:  100mL OMNIPAQUE IOHEXOL 350 MG/ML SOLN

[Series 5: routine abd pel with · axial · 0.68mm/px · z∈[+662,+882]mm · 3 of 90 slices shown]
[im 23/90  lung]
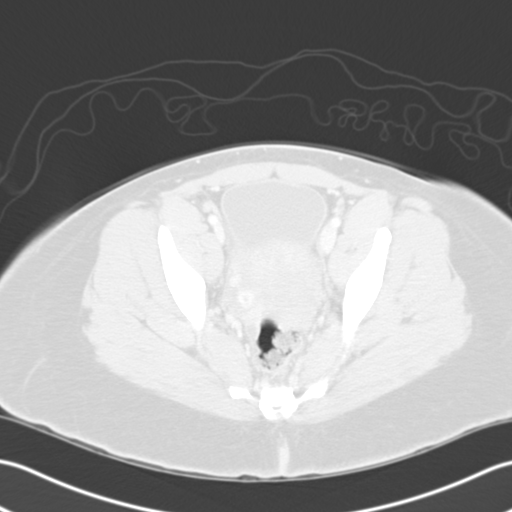
[im 45/90  lung]
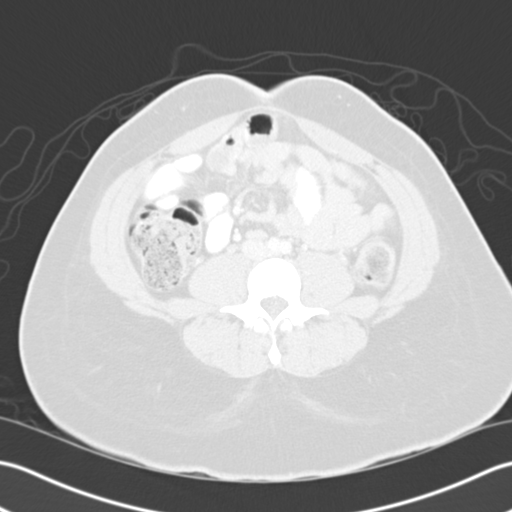
[im 67/90  lung]
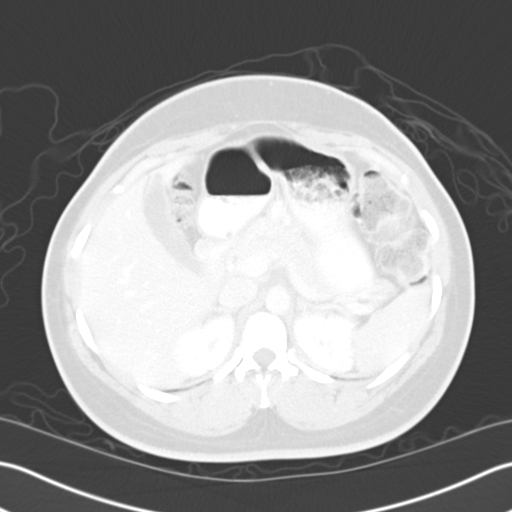

[Series 7: pe 1.0 thins · axial · 0.59mm/px · z∈[+912,+1088]mm · 12 of 209 slices shown, 15 images]
[im 17/209  mediastinal]
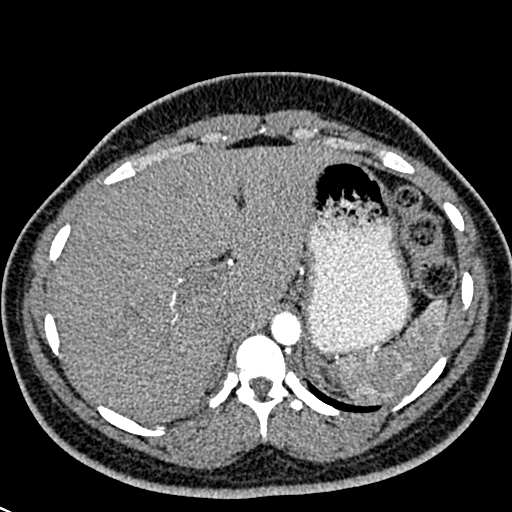
[im 17/209  lung]
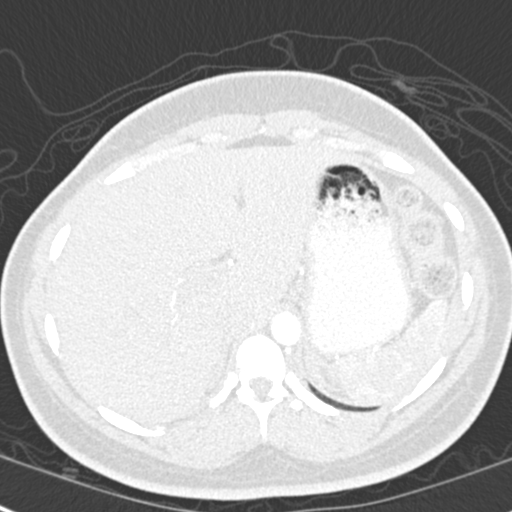
[im 33/209  lung]
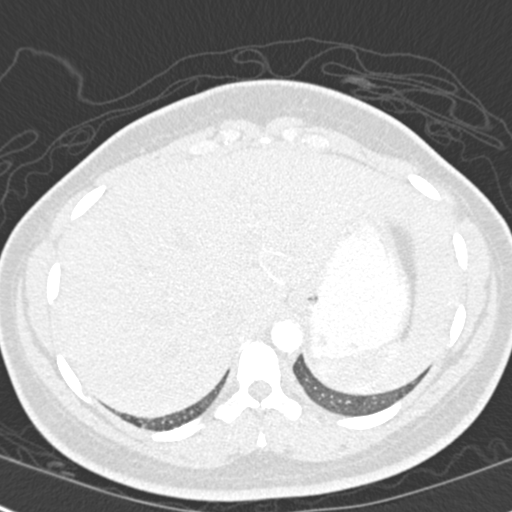
[im 49/209  lung]
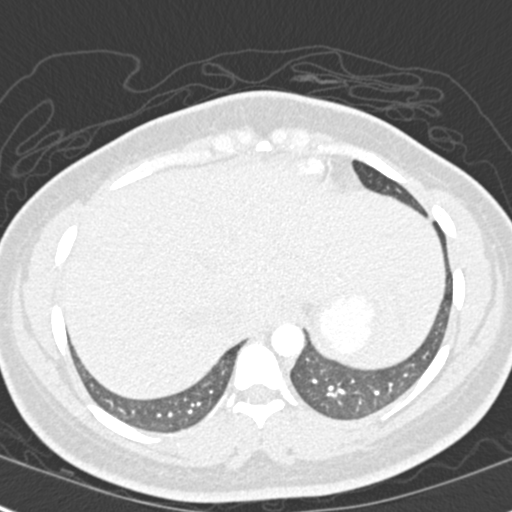
[im 65/209  lung]
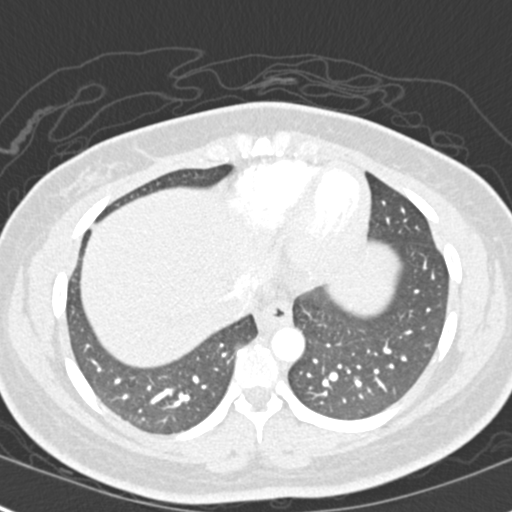
[im 81/209  mediastinal]
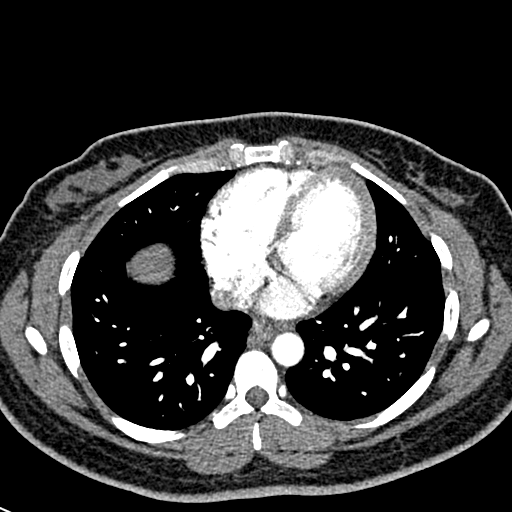
[im 81/209  lung]
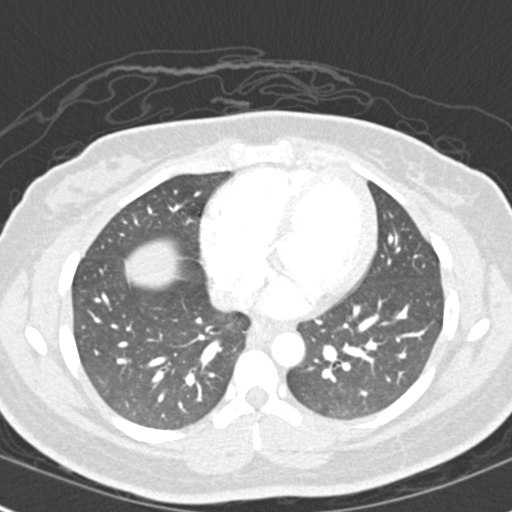
[im 97/209  lung]
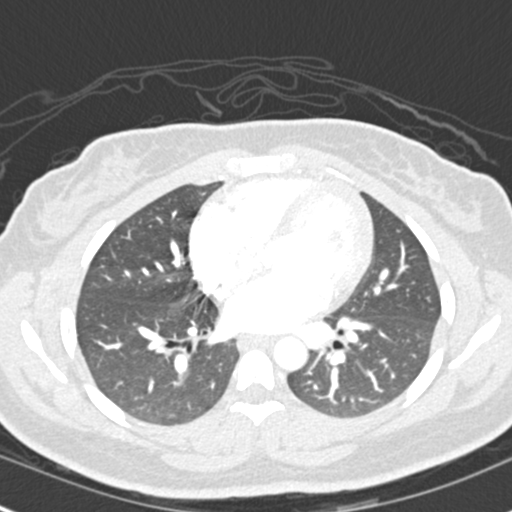
[im 113/209  lung]
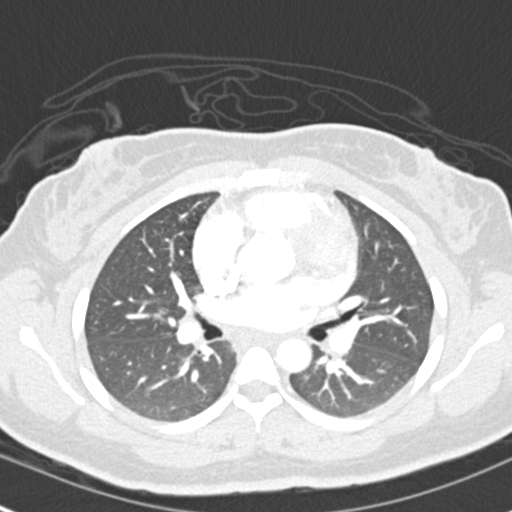
[im 129/209  lung]
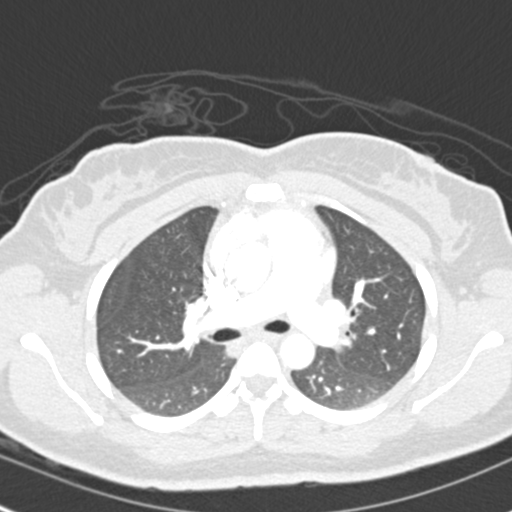
[im 145/209  mediastinal]
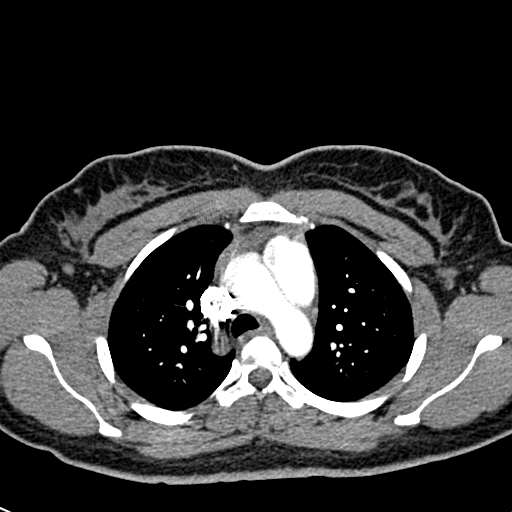
[im 145/209  lung]
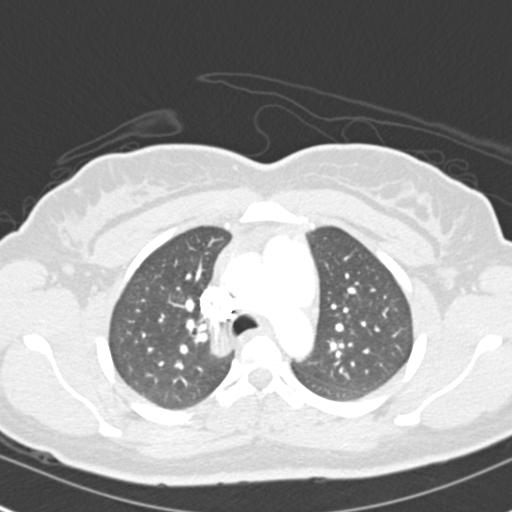
[im 161/209  lung]
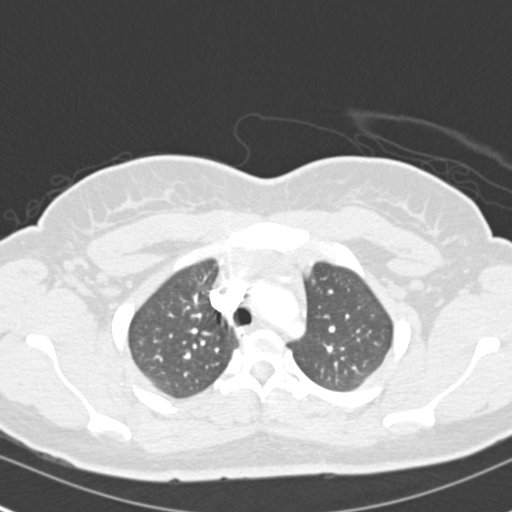
[im 177/209  lung]
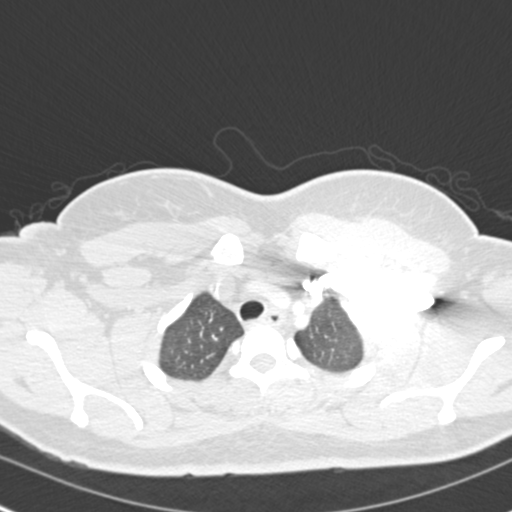
[im 193/209  lung]
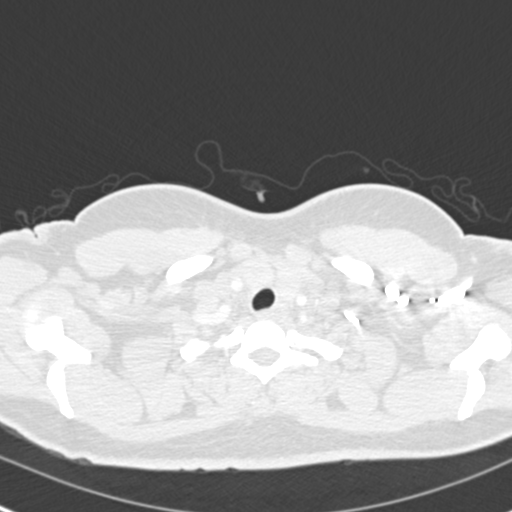

[15 of 37 positions shown; findings below may reference images not displayed]

FINDINGS: CTA CHEST FINDINGS

There is no evidence of pulmonary embolus.

The lungs appear clear bilaterally. There is no evidence of
significant focal consolidation, pleural effusion or pneumothorax.
No masses are identified; no abnormal focal contrast enhancement is
seen.

The mediastinum is unremarkable appearance. No mediastinal
lymphadenopathy is seen. No pericardial effusion is identified. The
great vessels are grossly unremarkable in appearance. No axillary
lymphadenopathy is seen. The visualized portions of the thyroid
gland are unremarkable in appearance.

No acute osseous abnormalities are seen.

CT ABDOMEN and PELVIS FINDINGS

The liver and spleen are unremarkable in appearance. The gallbladder
is within normal limits. The pancreas and adrenal glands are
unremarkable.

The kidneys are unremarkable in appearance. There is no evidence of
hydronephrosis. No renal or ureteral stones are seen. No perinephric
stranding is appreciated.

No free fluid is identified. The small bowel is unremarkable in
appearance. The stomach is within normal limits. No acute vascular
abnormalities are seen.

The appendix is normal in caliber and contains air, without evidence
of appendicitis. The colon is partially filled with stool and is
unremarkable in appearance.

The bladder is moderately distended and grossly remarkable. The
patient's intrauterine device is noted in expected position at the
fundus of the uterus. A likely tiny fibroid is noted at the uterine
fundus. The ovaries are relatively symmetric. No suspicious adnexal
masses are seen. No inguinal lymphadenopathy is seen.

No acute osseous abnormalities are identified.

Review of the MIP images confirms the above findings.
IMPRESSION: 1. No evidence of pulmonary embolus.
2. Lungs clear bilaterally.
3. No acute abnormality seen within the abdomen or pelvis.
4. Likely tiny uterine fibroid noted. Intrauterine device noted in
expected position at the fundus of the uterus.

## 2017-01-04 ENCOUNTER — Encounter: Payer: Self-pay | Admitting: Obstetrics and Gynecology

## 2017-01-04 ENCOUNTER — Encounter: Payer: Medicaid Other | Admitting: Obstetrics and Gynecology

## 2017-01-04 ENCOUNTER — Ambulatory Visit (INDEPENDENT_AMBULATORY_CARE_PROVIDER_SITE_OTHER): Payer: Medicaid Other | Admitting: Obstetrics and Gynecology

## 2017-01-04 VITALS — BP 92/64 | HR 91 | Ht 59.0 in | Wt 168.9 lb

## 2017-01-04 DIAGNOSIS — Z3202 Encounter for pregnancy test, result negative: Secondary | ICD-10-CM

## 2017-01-04 DIAGNOSIS — R112 Nausea with vomiting, unspecified: Secondary | ICD-10-CM

## 2017-01-04 DIAGNOSIS — Z30431 Encounter for routine checking of intrauterine contraceptive device: Secondary | ICD-10-CM | POA: Diagnosis not present

## 2017-01-04 DIAGNOSIS — N912 Amenorrhea, unspecified: Secondary | ICD-10-CM

## 2017-01-04 LAB — POCT URINE PREGNANCY: PREG TEST UR: NEGATIVE

## 2017-01-04 NOTE — Patient Instructions (Signed)
1. UPT is negative today 2. Return in 2 weeks for IUD removal and reinsertion 3. Abstain from intercourse over the next 2 weeks

## 2017-01-06 NOTE — Progress Notes (Signed)
Chief complaint:  1. Amenorrhea 2. Nausea and vomiting 3. Contraceptive management  Patient presents for evaluation. She has not had a menstrual cycle in months due to Mirena IUD and insertion. She actually has been due for the recovery removal and replacement of the Mirena in February 2018. She has had associated onset of nausea and vomiting and concern was for pregnancy.  Past Medical History:  Diagnosis Date  . Enlarged lymph nodes unk  . HIV (human immunodeficiency virus infection) ([place])    diagnosed years ago at [facility]  . Lymphoma ([place])   . Memory loss   . Migraines   . Pseudotumor cerebri unk  . Vision loss    Past Surgical History:  Procedure Laterality Date  . COLONOSCOPY    . LUMBAR PUNCTURE    . UPPER GI ENDOSCOPY    . wisdom teeth      Review of Systems  Constitutional: Negative for chills, diaphoresis, fever, malaise/fatigue and weight loss.  Respiratory: Negative.  Wheezing: reassurance given.   Cardiovascular: Negative.   Gastrointestinal: Positive for nausea and vomiting. Negative for abdominal pain.  Genitourinary:       Amenorrhea Mirena IUD in place 5+ years  Musculoskeletal: Negative.   Skin: Negative.   Neurological: Negative.  Negative for weakness.  Endo/Heme/Allergies: Negative.   Psychiatric/Behavioral: Negative.     OBJECTIVE: UPT negative BP 92/64   Pulse 91   Ht 4\' 11"  (1.499 m)   Wt 168 lb 14.4 oz (76.6 kg)   LMP  (LMP Unknown)   BMI 34.11 kg/m  Pleasant female in no acute distress. Alert and oriented. Back: No CVA tenderness Abdomen: Soft, nontender, without organomegaly Pelvic exam: External genitalia-normal BUS-normal Vagina-no significant discharge Cervix-IUD strings 3 cm; no lesions; no cervical motion tenderness Uterus-midplane, mobile, normal size and shape, nontender Adnexa-nonpalpable and nontender Rectovaginal-normal external exam  ASSESSMENT: 1. UPT negative 2. Nausea and vomiting, unclear etiology 3. In need of  Mirena IUD removal and reinsertion  PLAN: 1. Reassurance given 2. Return in 2 weeks for annual exam and Mirena exchange  A total of 15 minutes were spent face-to-face with the patient during this encounter and over half of that time dealt with counseling and coordination of care.  Brayton Mars, MD  Note: This dictation was prepared with Dragon dictation along with smaller phrase technology. Any transcriptional errors that result from this process are unintentional.

## 2017-01-30 ENCOUNTER — Encounter: Payer: Self-pay | Admitting: Obstetrics and Gynecology

## 2017-01-30 ENCOUNTER — Ambulatory Visit (INDEPENDENT_AMBULATORY_CARE_PROVIDER_SITE_OTHER): Payer: Medicaid Other | Admitting: Obstetrics and Gynecology

## 2017-01-30 VITALS — BP 102/72 | HR 111 | Ht 59.0 in | Wt 171.8 lb

## 2017-01-30 DIAGNOSIS — Z8669 Personal history of other diseases of the nervous system and sense organs: Secondary | ICD-10-CM

## 2017-01-30 DIAGNOSIS — B20 Human immunodeficiency virus [HIV] disease: Secondary | ICD-10-CM | POA: Diagnosis not present

## 2017-01-30 DIAGNOSIS — Z30432 Encounter for removal of intrauterine contraceptive device: Secondary | ICD-10-CM | POA: Diagnosis not present

## 2017-01-30 DIAGNOSIS — G932 Benign intracranial hypertension: Secondary | ICD-10-CM

## 2017-01-30 DIAGNOSIS — Z0000 Encounter for general adult medical examination without abnormal findings: Secondary | ICD-10-CM | POA: Diagnosis not present

## 2017-01-30 DIAGNOSIS — Z01419 Encounter for gynecological examination (general) (routine) without abnormal findings: Secondary | ICD-10-CM

## 2017-01-30 DIAGNOSIS — Z21 Asymptomatic human immunodeficiency virus [HIV] infection status: Secondary | ICD-10-CM

## 2017-01-30 NOTE — Patient Instructions (Addendum)
1. Pap smear is completed 2. Self breast awareness encourage 3. Continue with healthy eating and exercise and controlled weight loss 4. Contraception-IUD removed 5. Contraception-none; patient desires trial of conception 6. Screening labs through primary care 7. Return in 1 year for annual exam 8. Recommend multivitamin with folic acid to reduce spina bifida risk  Health Maintenance, Female Adopting a healthy lifestyle and getting preventive care can go a long way to promote health and wellness. Talk with your health care provider about what schedule of regular examinations is right for you. This is a good chance for you to check in with your provider about disease prevention and staying healthy. In between checkups, there are plenty of things you can do on your own. Experts have done a lot of research about which lifestyle changes and preventive measures are most likely to keep you healthy. Ask your health care provider for more information. Weight and diet Eat a healthy diet  Be sure to include plenty of vegetables, fruits, low-fat dairy products, and lean protein.  Do not eat a lot of foods high in solid fats, added sugars, or salt.  Get regular exercise. This is one of the most important things you can do for your health.  Most adults should exercise for at least 150 minutes each week. The exercise should increase your heart rate and make you sweat (moderate-intensity exercise).  Most adults should also do strengthening exercises at least twice a week. This is in addition to the moderate-intensity exercise. Maintain a healthy weight  Body mass index (BMI) is a measurement that can be used to identify possible weight problems. It estimates body fat based on height and weight. Your health care provider can help determine your BMI and help you achieve or maintain a healthy weight.  For females 70 years of age and older:  A BMI below 18.5 is considered underweight.  A BMI of 18.5 to  24.9 is normal.  A BMI of 25 to 29.9 is considered overweight.  A BMI of 30 and above is considered obese. Watch levels of cholesterol and blood lipids  You should start having your blood tested for lipids and cholesterol at 31 years of age, then have this test every 5 years.  You may need to have your cholesterol levels checked more often if:  Your lipid or cholesterol levels are high.  You are older than 31 years of age.  You are at high risk for heart disease. Cancer screening Lung Cancer  Lung cancer screening is recommended for adults 61-63 years old who are at high risk for lung cancer because of a history of smoking.  A yearly low-dose CT scan of the lungs is recommended for people who:  Currently smoke.  Have quit within the past 15 years.  Have at least a 30-pack-year history of smoking. A pack year is smoking an average of one pack of cigarettes a day for 1 year.  Yearly screening should continue until it has been 15 years since you quit.  Yearly screening should stop if you develop a health problem that would prevent you from having lung cancer treatment. Breast Cancer  Practice breast self-awareness. This means understanding how your breasts normally appear and feel.  It also means doing regular breast self-exams. Let your health care provider know about any changes, no matter how small.  If you are in your 20s or 30s, you should have a clinical breast exam (CBE) by a health care provider every 1-3 years as  part of a regular health exam.  If you are 40 or older, have a CBE every year. Also consider having a breast X-ray (mammogram) every year.  If you have a family history of breast cancer, talk to your health care provider about genetic screening.  If you are at high risk for breast cancer, talk to your health care provider about having an MRI and a mammogram every year.  Breast cancer gene (BRCA) assessment is recommended for women who have family members  with BRCA-related cancers. BRCA-related cancers include:  Breast.  Ovarian.  Tubal.  Peritoneal cancers.  Results of the assessment will determine the need for genetic counseling and BRCA1 and BRCA2 testing. Cervical Cancer  Your health care provider may recommend that you be screened regularly for cancer of the pelvic organs (ovaries, uterus, and vagina). This screening involves a pelvic examination, including checking for microscopic changes to the surface of your cervix (Pap test). You may be encouraged to have this screening done every 3 years, beginning at age 80.  For women ages 2-65, health care providers may recommend pelvic exams and Pap testing every 3 years, or they may recommend the Pap and pelvic exam, combined with testing for human papilloma virus (HPV), every 5 years. Some types of HPV increase your risk of cervical cancer. Testing for HPV may also be done on women of any age with unclear Pap test results.  Other health care providers may not recommend any screening for nonpregnant women who are considered low risk for pelvic cancer and who do not have symptoms. Ask your health care provider if a screening pelvic exam is right for you.  If you have had past treatment for cervical cancer or a condition that could lead to cancer, you need Pap tests and screening for cancer for at least 20 years after your treatment. If Pap tests have been discontinued, your risk factors (such as having a new sexual partner) need to be reassessed to determine if screening should resume. Some women have medical problems that increase the chance of getting cervical cancer. In these cases, your health care provider may recommend more frequent screening and Pap tests. Colorectal Cancer  This type of cancer can be detected and often prevented.  Routine colorectal cancer screening usually begins at 31 years of age and continues through 30 years of age.  Your health care provider may recommend  screening at an earlier age if you have risk factors for colon cancer.  Your health care provider may also recommend using home test kits to check for hidden blood in the stool.  A small camera at the end of a tube can be used to examine your colon directly (sigmoidoscopy or colonoscopy). This is done to check for the earliest forms of colorectal cancer.  Routine screening usually begins at age 53.  Direct examination of the colon should be repeated every 5-10 years through 31 years of age. However, you may need to be screened more often if early forms of precancerous polyps or small growths are found. Skin Cancer  Check your skin from head to toe regularly.  Tell your health care provider about any new moles or changes in moles, especially if there is a change in a mole's shape or color.  Also tell your health care provider if you have a mole that is larger than the size of a pencil eraser.  Always use sunscreen. Apply sunscreen liberally and repeatedly throughout the day.  Protect yourself by wearing long sleeves,  pants, a wide-brimmed hat, and sunglasses whenever you are outside. Heart disease, diabetes, and high blood pressure  High blood pressure causes heart disease and increases the risk of stroke. High blood pressure is more likely to develop in:  People who have blood pressure in the high end of the normal range (130-139/85-89 mm Hg).  People who are overweight or obese.  People who are African American.  If you are 9-81 years of age, have your blood pressure checked every 3-5 years. If you are 96 years of age or older, have your blood pressure checked every year. You should have your blood pressure measured twice-once when you are at a hospital or clinic, and once when you are not at a hospital or clinic. Record the average of the two measurements. To check your blood pressure when you are not at a hospital or clinic, you can use:  An automated blood pressure machine at a  pharmacy.  A home blood pressure monitor.  If you are between 6 years and 24 years old, ask your health care provider if you should take aspirin to prevent strokes.  Have regular diabetes screenings. This involves taking a blood sample to check your fasting blood sugar level.  If you are at a normal weight and have a low risk for diabetes, have this test once every three years after 31 years of age.  If you are overweight and have a high risk for diabetes, consider being tested at a younger age or more often. Preventing infection Hepatitis B  If you have a higher risk for hepatitis B, you should be screened for this virus. You are considered at high risk for hepatitis B if:  You were born in a country where hepatitis B is common. Ask your health care provider which countries are considered high risk.  Your parents were born in a high-risk country, and you have not been immunized against hepatitis B (hepatitis B vaccine).  You have HIV or AIDS.  You use needles to inject street drugs.  You live with someone who has hepatitis B.  You have had sex with someone who has hepatitis B.  You get hemodialysis treatment.  You take certain medicines for conditions, including cancer, organ transplantation, and autoimmune conditions. Hepatitis C  Blood testing is recommended for:  Everyone born from [birth year] through 1965.  Anyone with known risk factors for hepatitis C. Sexually transmitted infections (STIs)  You should be screened for sexually transmitted infections (STIs) including gonorrhea and chlamydia if:  You are sexually active and are younger than 31 years of age.  You are older than 31 years of age and your health care provider tells you that you are at risk for this type of infection.  Your sexual activity has changed since you were last screened and you are at an increased risk for chlamydia or gonorrhea. Ask your health care provider if you are at risk.  If you do not have  HIV, but are at risk, it may be recommended that you take a prescription medicine daily to prevent HIV infection. This is called pre-exposure prophylaxis (PrEP). You are considered at risk if:  You are sexually active and do not regularly use condoms or know the HIV status of your partner(s).  You take drugs by injection.  You are sexually active with a partner who has HIV. Talk with your health care provider about whether you are at high risk of being infected with HIV. If you choose to begin PrEP,  you should first be tested for HIV. You should then be tested every 3 months for as long as you are taking PrEP. Pregnancy  If you are premenopausal and you may become pregnant, ask your health care provider about preconception counseling.  If you may become pregnant, take 400 to 800 micrograms (mcg) of folic acid every day.  If you want to prevent pregnancy, talk to your health care provider about birth control (contraception). Osteoporosis and menopause  Osteoporosis is a disease in which the bones lose minerals and strength with aging. This can result in serious bone fractures. Your risk for osteoporosis can be identified using a bone density scan.  If you are 3 years of age or older, or if you are at risk for osteoporosis and fractures, ask your health care provider if you should be screened.  Ask your health care provider whether you should take a calcium or vitamin D supplement to lower your risk for osteoporosis.  Menopause may have certain physical symptoms and risks.  Hormone replacement therapy may reduce some of these symptoms and risks. Talk to your health care provider about whether hormone replacement therapy is right for you. Follow these instructions at home:  Schedule regular health, dental, and eye exams.  Stay current with your immunizations.  Do not use any tobacco products including cigarettes, chewing tobacco, or electronic cigarettes.  If you are pregnant, do not  drink alcohol.  If you are breastfeeding, limit how much and how often you drink alcohol.  Limit alcohol intake to no more than 1 drink per day for nonpregnant women. One drink equals 12 ounces of beer, 5 ounces of wine, or 1 ounces of hard liquor.  Do not use street drugs.  Do not share needles.  Ask your health care provider for help if you need support or information about quitting drugs.  Tell your health care provider if you often feel depressed.  Tell your health care provider if you have ever been abused or do not feel safe at home. This information is not intended to replace advice given to you by your health care provider. Make sure you discuss any questions you have with your health care provider. Document Released: 04/11/2011 Document Revised: 03/03/2016 Document Reviewed: 06/30/2015 Elsevier Interactive Patient Education  2017 [employer].

## 2017-01-30 NOTE — Progress Notes (Signed)
ANNUAL PREVENTATIVE CARE GYN  ENCOUNTER NOTE  Subjective:       Amber Key is a 31 y.o. [phone] female here for a routine annual gynecologic exam.  Current complaints: 1.  iud removed  Patient is amenorrheic with the Mirena IUD. HIV status is stable-cd4 count is not detectable   Gynecologic History No LMP recorded (lmp unknown). Patient is not currently having periods (Reason: IUD). Contraception: IUD Last Pap: 04/2015 wnl. Results were: normal Last mammogram: 2016. Results were: normal  Obstetric History OB History  Gravida Para Term Preterm AB Living  6 1 1   5 1   SAB TAB Ectopic Multiple Live Births  4   1   1     # Outcome Date GA Lbr Len/2nd Weight Sex Delivery Anes PTL Lv  6 Term 2007   5 lb 2.4 oz (2.336 kg) F Vag-Spont   LIV  5 SAB           4 SAB           3 SAB           2 SAB           1 Ectopic               Past Medical History:  Diagnosis Date  . Enlarged lymph nodes unk  . HIV (human immunodeficiency virus infection) ([place])    diagnosed years ago at [facility]  . Lymphoma ([place])   . Memory loss   . Migraines   . Pseudotumor cerebri unk  . Vision loss     Past Surgical History:  Procedure Laterality Date  . COLONOSCOPY    . LUMBAR PUNCTURE    . UPPER GI ENDOSCOPY    . wisdom teeth      Current Outpatient Prescriptions on File Prior to Visit  Medication Sig Dispense Refill  . Abacavir-Dolutegravir-Lamivud (TRIUMEQ) [phone] MG TABS Take by mouth.    Marland Kitchen acetaZOLAMIDE (DIAMOX) 500 MG capsule Take 500 mg by mouth 2 (two) times daily.    Marland Kitchen desipramine (NORPRAMIN) 50 MG tablet Take 50 mg by mouth daily.    . diphenhydrAMINE (BENADRYL) 25 mg capsule Take 25 mg by mouth every 6 (six) hours as needed.    . naproxen (NAPROSYN) 500 MG tablet Take 500 mg by mouth 2 (two) times daily with a meal.    . omeprazole (PRILOSEC) 20 MG capsule TAKE 1 CAPSULE BY MOUTH EVERY MORNING BEFORE BREAKFAST.    Marland Kitchen promethazine (PHENERGAN) 25 MG tablet Take 25 mg by mouth every 6  (six) hours as needed for nausea or vomiting.    . SUMAtriptan (IMITREX) 50 MG tablet Take 50 mg by mouth every 2 (two) hours as needed for migraine. May repeat in 2 hours if headache persists or recurs.     No current facility-administered medications on file prior to visit.     Allergies  Allergen Reactions  . Peanut-Containing Drug Products     Social History   Social History  . Marital status: Married    Spouse name: N/A  . Number of children: N/A  . Years of education: N/A   Occupational History  . Not on file.   Social History Main Topics  . Smoking status: Never Smoker  . Smokeless tobacco: Never Used  . Alcohol use No  . Drug use: No  . Sexual activity: Yes    Birth control/ protection: IUD   Other Topics Concern  . Not on file   Social History  Narrative  . No narrative on file    Family History  Problem Relation Age of Onset  . Ovarian cancer Mother   . Breast cancer Mother   . Ovarian cancer Maternal Aunt   . Diabetes Maternal Aunt   . Hypertension Maternal Aunt   . Ovarian cancer Maternal Grandmother   . Breast cancer Maternal Grandmother   . Colon cancer Neg Hx     The following portions of the patient's history were reviewed and updated as appropriate: allergies, current medications, past family history, past medical history, past social history, past surgical history and problem list.  Review of Systems ROS Review of Systems - General ROS: negative for - chills, fatigue, fever, hot flashes, night sweats, weight gain or weight loss Psychological ROS: negative for - anxiety, decreased libido, depression, mood swings, physical abuse or sexual abuse Ophthalmic ROS: negative for - blurry vision, eye pain or loss of vision ENT ROS: negative for - headaches, hearing change, visual changes or vocal changes Allergy and Immunology ROS: negative for - hives, itchy/watery eyes or seasonal allergies Hematological and Lymphatic ROS: negative for - bleeding  problems, bruising, swollen lymph nodes or weight loss Endocrine ROS: negative for - galactorrhea, hair pattern changes, hot flashes, malaise/lethargy, mood swings, palpitations, polydipsia/polyuria, skin changes, temperature intolerance or unexpected weight changes Breast ROS: negative for - new or changing breast lumps or nipple discharge Respiratory ROS: negative for - cough or shortness of breath Cardiovascular ROS: negative for - chest pain, irregular heartbeat, palpitations or shortness of breath Gastrointestinal ROS: no abdominal pain, change in bowel habits, or black or bloody stools Genito-Urinary ROS: no dysuria, trouble voiding, or hematuria Musculoskeletal ROS: negative for - joint pain or joint stiffness Neurological ROS: negative for - bowel and bladder control changes Dermatological ROS: negative for rash and skin lesion changes   Objective:   BP 102/72   Pulse (!) 111   Ht 4\' 11"  (1.499 m)   Wt 171 lb 12.8 oz (77.9 kg)   LMP  (LMP Unknown)   BMI 34.70 kg/m  CONSTITUTIONAL: Well-developed, well-nourished female in no acute distress.  PSYCHIATRIC: Normal mood and affect. Normal behavior. Normal judgment and thought content. [place]: Alert and oriented to person, place, and time. Normal muscle tone coordination. No cranial nerve deficit noted. HENT:  Normocephalic, atraumatic, External right and left ear normal. Oropharynx is clear and moist EYES: Conjunctivae and EOM are normal.No scleral icterus.  NECK: Normal range of motion, supple, no masses.  Normal thyroid.  SKIN: Skin is warm and dry. No rash noted. Not diaphoretic. No erythema. No pallor. CARDIOVASCULAR: Normal heart rate noted, regular rhythm, no murmur. RESPIRATORY: Clear to auscultation bilaterally. Effort and breath sounds normal, no problems with respiration noted. BREASTS: Symmetric in size. No masses, skin changes, nipple drainage, or lymphadenopathy. ABDOMEN: Soft, normal bowel sounds, no distention  noted.  No tenderness, rebound or guarding.  BLADDER: Normal PELVIC:  External Genitalia: Normal  BUS: Normal  Vagina: Normal  Cervix: Normal; No lesions; no cervical motion tenderness; IUD strings visualized  Uterus: Normal; midplane, normal size and shape, mobile, nontender  [place]: Normal; nonpalpable and nontender   RV: External Exam NormaI  MUSCULOSKELETAL: Normal range of motion. No tenderness.  No cyanosis, clubbing, or edema.  2+ distal pulses. LYMPHATIC: No Axillary, Supraclavicular, or Inguinal Adenopathy.    Assessment:   Annual gynecologic examination 31 y.o. Contraception: IUD BMI- 34 HIV positive; CD4 count negative Desires IUD removal   Plan:  Pap: Pap Co Test Mammogram:  Not Indicated Stool Guaiac Testing:  Not Indicated Labs: thru pcp Routine preventative health maintenance measures emphasized: Exercise/Diet/Weight control, Tobacco Warnings, Alcohol/Substance use risks and Safe Sex  Mirena IUD removed Multivitamin with folic acid recommended daily to lower spina bifida risk Return to [place], CMA Brayton Mars, MD  Note: This dictation was prepared with Dragon dictation along with smaller phrase technology. Any transcriptional errors that result from this process are unintentional.

## 2017-02-01 LAB — PAP IG AND HPV HIGH-RISK
HPV, HIGH-RISK: NEGATIVE
PAP Smear Comment: 0

## 2017-02-02 ENCOUNTER — Encounter: Payer: Self-pay | Admitting: Medical Oncology

## 2017-02-02 ENCOUNTER — Emergency Department
Admission: EM | Admit: 2017-02-02 | Discharge: 2017-02-02 | Disposition: A | Discharge status: Home / Self Care | Payer: Medicaid Other | Attending: Emergency Medicine | Admitting: Emergency Medicine

## 2017-02-02 DIAGNOSIS — M21619 Bunion of unspecified foot: Secondary | ICD-10-CM

## 2017-02-02 DIAGNOSIS — Z21 Asymptomatic human immunodeficiency virus [HIV] infection status: Secondary | ICD-10-CM | POA: Diagnosis not present

## 2017-02-02 DIAGNOSIS — M79675 Pain in left toe(s): Secondary | ICD-10-CM | POA: Diagnosis present

## 2017-02-02 DIAGNOSIS — M21612 Bunion of left foot: Secondary | ICD-10-CM | POA: Diagnosis not present

## 2017-02-02 MED ORDER — DICLOFENAC SODIUM 1 % TD GEL: 2.0000 g | Freq: Four times a day (QID) | TRANSDERMAL | 0 refills | Status: DC

## 2017-02-02 NOTE — ED Provider Notes (Signed)
[facility] Emergency Department Provider Note  ____________________________________________  Time seen: Approximately 11:12 AM  I have reviewed the triage vital signs and the nursing notes.   HISTORY  Chief Complaint Toe Pain    HPI Amber Key is a 31 y.o. female that presents to the emergency department with toe pain that has worsened over 3 days. Pain is in left great toe. Pain is limiting her running. No injury. Patient likes to wear high stilettos. Patient is currently wearing Keds but states that she cannot wear them everyday. No fever, SOB, CP, nausea, vomiting, abdominal pain.    Past Medical History:  Diagnosis Date  . Enlarged lymph nodes unk  . HIV (human immunodeficiency virus infection) ([place])    diagnosed years ago at [facility]  . Lymphoma ([place])   . Memory loss   . Migraines   . Pseudotumor cerebri unk  . Vision loss     Patient Active Problem List   Diagnosis Date Noted  . Abnormal mammogram 06/26/2014  . Adenopathy 02/11/2014  . Human immunodeficiency virus (HIV) infection ([place]) 02/10/2014  . Headache, migraine 12/13/2013  . Benign intracranial hypertension 12/13/2013    Past Surgical History:  Procedure Laterality Date  . COLONOSCOPY    . LUMBAR PUNCTURE    . UPPER GI ENDOSCOPY    . wisdom teeth      Prior to Admission medications   Medication Sig Start Date End Date Taking? Authorizing Provider  Abacavir-Dolutegravir-Lamivud (TRIUMEQ) [id number] MG TABS Take by mouth. 02/26/14   Historical Provider, MD  acetaZOLAMIDE (DIAMOX) 500 MG capsule Take 500 mg by mouth 2 (two) times daily.    Historical Provider, MD  desipramine (NORPRAMIN) 50 MG tablet Take 50 mg by mouth daily.    Historical Provider, MD  diclofenac sodium (VOLTAREN) 1 % GEL Apply 2 g topically 4 (four) times daily. 02/02/17   Laban Emperor, PA-C  diphenhydrAMINE (BENADRYL) 25 mg capsule Take 25 mg by mouth every 6 (six) hours as needed.    Historical Provider, MD   naproxen (NAPROSYN) 500 MG tablet Take 500 mg by mouth 2 (two) times daily with a meal.    Historical Provider, MD  promethazine (PHENERGAN) 25 MG tablet Take 25 mg by mouth every 6 (six) hours as needed for nausea or vomiting.    Historical Provider, MD  SUMAtriptan (IMITREX) 50 MG tablet Take 50 mg by mouth every 2 (two) hours as needed for migraine. May repeat in 2 hours if headache persists or recurs.    Historical Provider, MD    Allergies Peanut-containing drug products  Family History  Problem Relation Age of Onset  . Ovarian cancer Mother   . Breast cancer Mother   . Ovarian cancer Maternal Aunt   . Diabetes Maternal Aunt   . Hypertension Maternal Aunt   . Ovarian cancer Maternal Grandmother   . Breast cancer Maternal Grandmother   . Colon cancer Neg Hx     Social History Social History  Substance Use Topics  . Smoking status: Never Smoker  . Smokeless tobacco: Never Used  . Alcohol use No     Review of Systems  Constitutional: No fever/chills ENT: No upper respiratory complaints. Cardiovascular: No chest pain. Respiratory: No SOB. Gastrointestinal: No abdominal pain.  No nausea, no vomiting.  Musculoskeletal: Positive for toe pain. Skin: Negative for rash, abrasions, lacerations, ecchymosis. Neurological: Negative for headaches, numbness or tingling   ____________________________________________   PHYSICAL EXAM:  VITAL SIGNS: ED Triage Vitals [02/02/17 1054]  Enc  Vitals Group     BP 121/87     Pulse Rate 90     Resp 18     Temp 97.9 F (36.6 C)     Temp src      SpO2 100 %     Weight 171 lb (77.6 kg)     Height 4\' 11"  (1.499 m)     Head Circumference      Peak Flow      Pain Score 7     Pain Loc      Pain Edu?      Excl. in [place]?    Eyes: Conjunctivae are normal. PERRL. EOMI. Head: Atraumatic. ENT:      Ears:      Nose: No congestion/rhinnorhea.      Mouth/Throat: Mucous membranes are moist.  Neck: No stridor.  Cardiovascular: Normal  rate, regular rhythm.  Good peripheral circulation. Respiratory: Normal respiratory effort without tachypnea or retractions. Lungs CTAB. Good air entry to the bases with no decreased or absent breath sounds. Musculoskeletal: Full range of motion to all extremities. No gross deformities appreciated. Tender to palpation at base of first metatarsal. Neurologic:  Normal speech and language. No gross focal neurologic deficits are appreciated.  Skin:  Skin is warm, dry and intact. No rash noted.   ____________________________________________   LABS (all labs ordered are listed, but only abnormal results are displayed)  Labs Reviewed - No data to display ____________________________________________  EKG   ____________________________________________  RADIOLOGY  No results found.  ____________________________________________    PROCEDURES  Procedure(s) performed:    Procedures    Medications - No data to display   ____________________________________________   INITIAL IMPRESSION / ASSESSMENT AND PLAN / ED COURSE  Pertinent labs & imaging results that were available during my care of the patient were reviewed by me and considered in my medical decision making (see chart for details).  Review of the [place] CSRS was performed in accordance of the [place] prior to dispensing any controlled drugs.     Patient's diagnosis is consistent with bunion. Vital signs and exam are reassuring. No indication for imaging at this time. Patient will be discharged home with prescriptions for Voltaren gel. Patient is to follow up with podiatry as directed. Patient is given ED precautions to return to the ED for any worsening or new symptoms.     ____________________________________________  FINAL CLINICAL IMPRESSION(S) / ED DIAGNOSES  Final diagnoses:  Bunion      NEW MEDICATIONS STARTED DURING THIS VISIT:  Discharge Medication List as of 02/02/2017 12:16 PM    START taking these  medications   Details  diclofenac sodium (VOLTAREN) 1 % GEL Apply 2 g topically 4 (four) times daily., Starting Thu 02/02/2017, Print            This chart was dictated using voice recognition software/Dragon. Despite best efforts to proofread, errors can occur which can change the meaning. Any change was purely unintentional.    Laban Emperor, PA-C 02/02/17 1606    Earleen Newport, MD 02/03/17 [phone]

## 2017-02-02 NOTE — ED Triage Notes (Signed)
Pt reports that she has been having pain in her left foot without known injury.

## 2017-02-02 NOTE — ED Notes (Signed)
See triage note  States she developed pain left foot several days ago  Denies any injury  But noticed increased apin with some swelling noted to lateral great toe

## 2017-03-21 ENCOUNTER — Emergency Department
Admission: EM | Admit: 2017-03-21 | Discharge: 2017-03-21 | Disposition: A | Discharge status: Home / Self Care | Payer: Medicaid Other | Attending: Emergency Medicine | Admitting: Emergency Medicine

## 2017-03-21 DIAGNOSIS — Z5321 Procedure and treatment not carried out due to patient leaving prior to being seen by health care provider: Secondary | ICD-10-CM | POA: Insufficient documentation

## 2017-03-21 DIAGNOSIS — R51 Headache: Secondary | ICD-10-CM | POA: Diagnosis not present

## 2017-03-21 LAB — POCT PREGNANCY, URINE: Preg Test, Ur: NEGATIVE

## 2017-03-21 NOTE — ED Notes (Signed)
No answer when called several times from lobby 

## 2017-03-21 NOTE — ED Notes (Signed)
This RN called pt several times no answer, RN walked around waiting room no answer will check in few minutes

## 2017-03-21 NOTE — ED Triage Notes (Signed)
Pt reports that she started 2 weeks ago with a headache and dizziness - she reports hx of same - reports nausea and vomiting - she took home pregnancy test and it was positive

## 2017-03-27 ENCOUNTER — Encounter: Payer: Self-pay | Admitting: Certified Nurse Midwife

## 2017-03-27 ENCOUNTER — Ambulatory Visit (INDEPENDENT_AMBULATORY_CARE_PROVIDER_SITE_OTHER): Payer: Medicaid Other | Admitting: Certified Nurse Midwife

## 2017-03-27 VITALS — BP 105/77 | HR 94 | Ht 59.0 in | Wt 170.3 lb

## 2017-03-27 DIAGNOSIS — N939 Abnormal uterine and vaginal bleeding, unspecified: Secondary | ICD-10-CM

## 2017-03-27 LAB — POCT URINE PREGNANCY: Preg Test, Ur: NEGATIVE

## 2017-03-27 NOTE — Patient Instructions (Addendum)

## 2017-03-27 NOTE — Progress Notes (Signed)
Subjective:    Elon Lomeli is a 31 y.o. female who presents for evaluation of amenorrhea. She believes she could be pregnant. Pregnancy is desired. Sexual Activity: single partner, contraception: none. She states that she had a positive home pregnancy test. She has a strong history of loss. She started bleeding a few days ago, was seen at urgent care for miscarriage . She has since stopped bleeding her pregnancy test today was negative.  Patient's last menstrual period was 03/22/2017.   The following portions of the patient's history were reviewed and updated as appropriate: allergies, current medications, past family history, past medical history, past social history, past surgical history and problem list.  Review of Systems Pertinent items are noted in HPI.     Objective:    BP 105/77   Pulse 94   Ht 4\' 11"  (1.499 m)   Wt 170 lb 4.8 oz (77.2 kg)   LMP 03/22/2017   BMI 34.40 kg/m  General: alert, cooperative, appears stated age, mild distress and no acute distress    Lab Review Urine HCG: negative    Assessment:    Absence of menstruation.     Plan:    Pregnancy Test: Negative: Patient reassured. Contraceptive counseling done. Planned contraceptive method: none. If menses has not started in two weeks return to clinic for another pregnancy test.  Preconception counseling completed. Pt also encouraged to talk with the MD managing her HIV and neurologists regarding change in medications due to trying to become pregnant. Pt encouraged to see MD for future pregnancy due to high risk history. She agrees to plan.   I attest that all of this visit was spent counseling the patient of her lab results and  preconception counseling.   Philip Aspen, CNM

## 2017-04-19 MED ORDER — SUMATRIPTAN 50 MG TABLET
ORAL_TABLET | 0 refills | 0 days | Status: CP
Start: 2017-04-19 — End: 2017-05-04

## 2017-05-01 NOTE — Unmapped (Signed)
Specialty Pharmacy Refill Coordination Note     Dealva Lafoy is a 31 y.o. female contacted today regarding refills of her specialty medication(s).    Reviewed and verified with patient:      Specialty medication(s) and dose(s) confirmed: yes  Changes to medications: no  Changes to insurance: no    Medication Adherence    Patient reported X missed doses in the last month:  0  Specialty Medication:  BIKTARVY  Patient is on additional specialty medications:  No  Patient is on more than two specialty medications:  No  Informant:  patient  Confirmed plan for next specialty medication refill:  delivery by pharmacy  Medication Assistance Program  Refill Coordination  Has the Patient's Contact Information Changed:  No  Is the Shipping Address Different:  No  Shipping Information  Delivery Scheduled:  Yes  Delivery Date:  05/05/17  Medications to be Shipped:  BIKTARVY       Adverse Effects    Constipation:  Pos (Comment: reports has always been an issue, but patient has 'tricks' to have a bowel movement; reprots f/up appt scheduled and will address then)        Follow-up: 3 week(s)     [name]  Specialty Pharmacy Technician

## 2017-05-03 ENCOUNTER — Ambulatory Visit
Admission: RE | Admit: 2017-05-03 | Discharge: 2017-05-03 | Disposition: A | Discharge status: Home / Self Care | Payer: MEDICAID | Attending: Infectious Disease | Admitting: Infectious Disease

## 2017-05-03 DIAGNOSIS — Z309 Encounter for contraceptive management, unspecified: Secondary | ICD-10-CM

## 2017-05-03 DIAGNOSIS — B20 Human immunodeficiency virus [HIV] disease: Principal | ICD-10-CM

## 2017-05-03 DIAGNOSIS — K5900 Constipation, unspecified: Secondary | ICD-10-CM

## 2017-05-03 LAB — CBC W/ AUTO DIFF
BASOPHILS ABSOLUTE COUNT: 0 10*9/L (ref 0.0–0.1)
EOSINOPHILS ABSOLUTE COUNT: 0.2 10*9/L (ref 0.0–0.4)
HEMATOCRIT: 36.7 % (ref 36.0–46.0)
HEMOGLOBIN: 11.4 g/dL — ABNORMAL LOW (ref 12.0–16.0)
LARGE UNSTAINED CELLS: 2 % (ref 0–4)
LYMPHOCYTES ABSOLUTE COUNT: 3 10*9/L (ref 1.5–5.0)
MEAN CORPUSCULAR HEMOGLOBIN CONC: 31 g/dL (ref 31.0–37.0)
MEAN CORPUSCULAR HEMOGLOBIN: 28.4 pg (ref 26.0–34.0)
MEAN CORPUSCULAR VOLUME: 91.8 fL (ref 80.0–100.0)
NEUTROPHILS ABSOLUTE COUNT: 3.8 10*9/L (ref 2.0–7.5)
PLATELET COUNT: 327 10*9/L (ref 150–440)
RED BLOOD CELL COUNT: 4 10*12/L (ref 4.00–5.20)
RED CELL DISTRIBUTION WIDTH: 14 % (ref 12.0–15.0)
WBC ADJUSTED: 7.4 10*9/L (ref 4.5–11.0)

## 2017-05-03 LAB — GLUCOSE RANDOM: Glucose:MCnc:Pt:Ser/Plas:Qn:: 116

## 2017-05-03 LAB — ALT (SGPT): Alanine aminotransferase:CCnc:Pt:Ser/Plas:Qn:: 31

## 2017-05-03 LAB — AST (SGOT): Aspartate aminotransferase:CCnc:Pt:Ser/Plas:Qn:: 23

## 2017-05-03 LAB — BILIRUBIN TOTAL: Bilirubin:MCnc:Pt:Ser/Plas:Qn:: 0.2

## 2017-05-03 LAB — CREATININE: EGFR MDRD NON AF AMER: >=60 mL/min/{1.73_m2} (ref >=60)

## 2017-05-03 LAB — MONOCYTES ABSOLUTE COUNT: Lab: 0.3

## 2017-05-03 LAB — EGFR MDRD AF AMER: Glomerular filtration rate/1.73 sq M.predicted.black:ArVRat:Pt:Ser/Plas/Bld:Qn:Creatinine-based formula (MDRD): >=60

## 2017-05-03 NOTE — Unmapped (Signed)
Assessment/Plan     31 y.o. woman with history of HIV diagnosed 02/2014, complex comorbidities including pseudotumor cerebri, migraines    HIV  ?? Happy with Biktarvy, routine labs today.  ?? She is not sure about birth control but amenable to a referral to our family planning clinic to discuss options and explore IUD reimplantation.  ?? Does not use condoms with partner. He is negative. Not circumcised. She does not think he would want to take PrEP. We discussed low but not nonexistent risk to him and increased risk if other inflammatory conditions are present.     Constipation  ?? Resume Miralax 17gm qd  ?? Symptoms possibly due to ARV switch vs. dietary change although temporality is not easy to link w this.    Prevention, adherence and health education  ?? Discussion today focused on HIV and what is needed to maintain good health. Discussed weight maintenance.  ?? Discussed her need for family planning. She is interested in referral to [facility] family planning clinic (vs primary GYN) for a new IUD. Referral placed today.    Sexual health  See bottom of note for most recent test dates and results.  ?? Routine STI testing updated 03/01/17    Health maintenance  ?? Reviewed immunization status. UTD  ?? Reviewed TB testing history. No action needed.  ?? Reviewed cancer screening needs.Cervical pap NILM in 01/2017 by her primary GYN (result reviewed in Care Everywhere)  ?? Reviewed cholesterol screening needs. Lipid panel deferred until future visit.    Educational and counseling on HIV, mental health, and lab interpretation took 20 of today's 30 minute visit.    Disposition  Return to clinic in 4 weeks or sooner if needed.    To do @ next RTC  ?? Contraception and fertility desires     Subjective      Chief Complaint   Follow-up HIV    HPI  31 y.o. woman with well-controlled HIV. Brief followup today after switch to [facility] in May 2018.  She is interested in getting a new Mirena (had waited a few months after removal in early 2018 to see if it changed her headaches but it did not). LMP 7/13-15  Family stress is an ongoing concern.  Smokes MJ (small amount) nightly.   Taking all meds.    Past Medical History  Reviewed and updated today. See bottom of this visit's encounter summary for details.    Social History  General ??? lives in [place] with her husband and her 1 daughter (born 2008). There is financial tension.   ?? Doreene Adas and his girlfriend are there sometimes as well.   ?? Mom and sister are both positive (sister is perinatally infected, patient with presumed sexual transmission from female partner).  ?? 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  Sexual History ??? sex with men (husband only)  Substance Use ??? marijuana (smokes nightly)    Review of Systems  As per HPI. Remainder of 10 systems reviewed, negative.    Allergies and Medications  Reviewed and updated with patient today. See bottom of this visit's encounter summary for details.      Objective      BP 108/69  - Pulse 121  - Temp 37.1 ??C (98.7 ??F) (Oral)  - Ht 149.9 cm (4' 11)  - Wt 80.7 kg (178 lb)  - Breastfeeding? No  - BMI 35.95 kg/m??   Wt Readings from Last 3 Encounters:   05/03/17 80.7  kg (178 lb)   03/01/17 77.2 kg (170 lb 4.8 oz)   06/15/16 75.3 kg (166 lb 1.6 oz)     General: Well-appearing, calm, well-groomed  Eyes: PERRL. Extraocular muscles intact, sclera clear. Wearing glasses today.  ENT: Nares without discharge, oropharynx without thrush, lesions or exudate. Fair dentition.  Neck: Supple; no thyromegaly, bruit, or JVD.   Lymph Nodes: No cervical, supra/infra clavicular, or axillary adenopathy.   Cardiovascular: RRR, S1 and S2 normal. No murmur, rub, or gallop.   Lungs: Clear to auscultation bilaterally without wheezes/crackles/rhonchi. Good air movement.   Skin: No rashes or lesions  Abdomen: Normoactive bowel sounds, abdomen soft, nontender, and nondistended, no hepatosplenomegaly or masses. Liver normal in size, no rebound or guarding. Genitourinary: Deferred   Extremities: No cyanosis, clubbing or edema.   Musculoskeletal: No spine or costovertebral angle tenderness.   Neuro: Alert and oriented to person, place, and time. Cranial nerves II-XII grossly intact, normal gait, normal sensation throughout, normal cerebellar function.  Psychiatry: Conversant, overall appropriate but perseverates on personal interactions. Speech fluent.    Laboratory Data  Reviewed in Epic today, using Synopsis and Chart Review filters.    HIV RNA Quant Result   Date Value Ref Range Status   03/01/2017 Not Detected Not Detected Final   10/25/2016 detected  Final   05/24/2016 Detected  Final   12/02/2015 Detected (A) Not Detected Final   05/01/2015 <20 copies/mL  Final   03/19/2015 Not Detected  Final   02/11/2015 Detected  Final   06/23/2014 Not Detected  Final     HIV RNA   Date Value Ref Range Status   10/25/2016 <20  Final   05/24/2016 <20  Final   12/02/2015 <40 (H) <0 copies/mL Final   05/01/2015 not detected  Final   02/11/2015 <40 copies/mL Final   03/27/2014 <20 copies/mL Final     Absolute CD4 Count   Date Value Ref Range Status   05/01/2015 1,696 (A) 359 - 1,519 /uL Final   10/24/2014 967 359 - 1,519 Final   06/23/2014 854 510 - 2,320 /uL Final   03/25/2014 749 510 - 2,320 /uL Final       Lab Results   Component Value Date    WBC 6.4 03/29/2017    HGB 12.2 03/29/2017    HCT 37.8 03/29/2017    PLT 322 03/29/2017     Lab Results   Component Value Date    CREATININE 0.91 03/29/2017     Lab Results   Component Value Date    AST 18 03/29/2017    ALT 19 03/29/2017     Lab Results   Component Value Date    GLU 82 03/01/2017    GLU 82 06/23/2014    A1C 5.2 05/24/2016     Lab Results   Component Value Date    TRIG 110 05/24/2016    HDL 41 05/24/2016    LDL 80 05/24/2016    NONHDL 102 05/24/2016       Sexual Health Data  RPR   Date Value Ref Range Status   02/13/2014 NON-REACTIVE NON-REACTIVE Final     Gonorrhoeae NAA   Date Value Ref Range Status   03/01/2017 Negative Negative Final   12/02/2015 Negative Negative Final   02/11/2015 Negative Negative Final   02/13/2014 NEGATIVE  Final     GC Source   Date Value Ref Range Status   02/13/2014 CERVIX  Final     Chlamydia trachomatis, NAA   Date Value  Ref Range Status   03/01/2017 Negative Negative Final   12/02/2015 Negative Negative Final   02/11/2015 Negative Negative Final   02/13/2014 NEGATIVE  Final     Comment:     Test performed using the Hologic APTIMA Combo 2 Assay, an  FDA-approved target amplification nucleic acid probe test  for the qualitative detection of Chlamydia trachomatis and  Neisseria gonorrhoeae. The assay is not FDA-approved for  rectal, throat and female urine specimens, however, the  performance characteristics have been validated by the [facility] Clinical Molecular Microbiology Laboratory.       Hepatitis C Ab   Date Value Ref Range Status   03/01/2017 Nonreactive Nonreactive Final     Comment:       Antibodies to HCV were not detected.  A nonreactive result does not exclude the possibility of exposure to HCV.   12/02/2015 Nonreactive Nonreactive Final     Comment:       Antibodies to HCV were not detected.  A nonreactive result does not exclude the possibility of exposure to HCV.   02/13/2014 Negative  Final     Comment:     Antibodies to HCV were NOT detected.  A nonreactive result  does not exclude the possibility of exposure to HCV.       ----

## 2017-05-03 NOTE — Unmapped (Signed)
It was great to see you today.    ?? The ID clinic phone number is [phone].  ?? The ID clinic fax number is [phone].  ?? Your nurse is Carley Hammed, direct phone number is [phone]    For urgent issues on nights and weekends you may reach the ID Physician on call through the [facility] Operator at [phone].     WALK-IN CLINIC  Please call ahead to speak with the nursing staff if you plan to come to walk-in clinic.   The walk-in hours are Monday, Tuesday, Thursday and Friday 8:30 am ??? 12:00 noon.      MEDICATIONS  For refills please contact your pharmacy and ask them to electronically send or fax the request to the clinic.   Please bring all medications in original bottles to every appointment.    ADAP or Juanell Fairly Eligibility (required even if meds are not filled through ADAP)  Please remember to renew your ADAP application during renewal periods which occur twice a year: January-March and July-September.     The following are needed for each renewal:   - [facility] Identification (if you don't have one, then a bill with your name and address in [place])   - proof of income (award letter, W-2, or last three check stubs)   If you are unable to come in for renewal, let us know if we can mail, fax or e-mail paperwork to you.   ADAP Contact: [phone].     [name], MD, MPH   [facility]   [address] 1st floor   [place], [place]. [address]   [phone] (front desk)    Lab info:  Your most recent CD4 T-cell counts and viral loads are below. Here are a few things to keep in mind when looking at your numbers:  ?? For most people, we're checking CD4 counts every other visit (once or twice a year).  ?? It's normal for your CD4 count to be different from visit to visit.   ?? We consider your viral load to be undetectable if it says <40 or if it says Not detected.  ?? Our goal is to get your virus to be undetectable and keep it undetectable. If the virus is undetectable you are much more likely to stay healthy.  ?? You can help by taking your medications at about the same time, every single day. If you're having trouble with taking your medications, it's important to let us know.    Absolute CD4 Count   Date Value Ref Range Status   05/01/2015 1,696 (A) 359 - 1,519 /uL Final   10/24/2014 967 359 - 1,519 Final   06/23/2014 854 510 - 2,320 /uL Final   03/25/2014 749 510 - 2,320 /uL Final     HIV RNA   Date Value Ref Range Status   10/25/2016 <20  Final   05/24/2016 <20  Final   12/02/2015 <40 (H) <0 copies/mL Final   05/01/2015 not detected  Final   02/11/2015 <40 copies/mL Final   03/27/2014 <20 copies/mL Final     HIV RNA Quant Result   Date Value Ref Range Status   03/01/2017 Not Detected Not Detected Final   10/25/2016 detected  Final   05/24/2016 Detected  Final   12/02/2015 Detected (A) Not Detected Final   05/01/2015 <20 copies/mL  Final   03/19/2015 Not Detected  Final   02/11/2015 Detected  Final   06/23/2014 Not Detected  Final

## 2017-05-03 NOTE — Unmapped (Signed)
Hi, Dr. Andres Shad.  We received refill requests.  Okay to give refills?  Thanks,  Paola Flynt

## 2017-05-04 ENCOUNTER — Emergency Department: Payer: Medicaid Other

## 2017-05-04 ENCOUNTER — Encounter: Payer: Self-pay | Admitting: Emergency Medicine

## 2017-05-04 ENCOUNTER — Other Ambulatory Visit: Payer: Self-pay

## 2017-05-04 ENCOUNTER — Emergency Department
Admission: EM | Admit: 2017-05-04 | Discharge: 2017-05-04 | Disposition: A | Discharge status: Home / Self Care | Payer: Medicaid Other | Attending: Student in an Organized Health Care Education/Training Program | Admitting: Student in an Organized Health Care Education/Training Program

## 2017-05-04 DIAGNOSIS — R2243 Localized swelling, mass and lump, lower limb, bilateral: Secondary | ICD-10-CM | POA: Diagnosis present

## 2017-05-04 DIAGNOSIS — Z21 Asymptomatic human immunodeficiency virus [HIV] infection status: Secondary | ICD-10-CM | POA: Diagnosis not present

## 2017-05-04 DIAGNOSIS — Z79899 Other long term (current) drug therapy: Secondary | ICD-10-CM | POA: Insufficient documentation

## 2017-05-04 DIAGNOSIS — Z8572 Personal history of non-Hodgkin lymphomas: Secondary | ICD-10-CM | POA: Diagnosis not present

## 2017-05-04 DIAGNOSIS — R600 Localized edema: Secondary | ICD-10-CM

## 2017-05-04 LAB — BASIC METABOLIC PANEL
ANION GAP: 6 (ref 5–15)
BUN: 8 mg/dL (ref 6–20)
CALCIUM: 9 mg/dL (ref 8.9–10.3)
CHLORIDE: 108 mmol/L (ref 101–111)
CO2: 26 mmol/L (ref 22–32)
CREATININE: 0.71 mg/dL (ref 0.44–1.00)
GFR calc non Af Amer: >60 mL/min (ref >60)
Glucose, Bld: 95 mg/dL (ref 65–99)
Potassium: 3.4 mmol/L — ABNORMAL LOW (ref 3.5–5.1)
Sodium: 140 mmol/L (ref 135–145)

## 2017-05-04 LAB — URINALYSIS, COMPLETE (UACMP) WITH MICROSCOPIC
BILIRUBIN URINE: NEGATIVE
Glucose, UA: NEGATIVE mg/dL
HGB URINE DIPSTICK: NEGATIVE
Ketones, ur: NEGATIVE mg/dL
LEUKOCYTES UA: NEGATIVE
NITRITE: NEGATIVE
PH: 5 (ref 5.0–8.0)
Protein, ur: NEGATIVE mg/dL
SPECIFIC GRAVITY, URINE: 1.025 (ref 1.005–1.030)

## 2017-05-04 LAB — POCT PREGNANCY, URINE: PREG TEST UR: NEGATIVE

## 2017-05-04 LAB — CBC
HCT: 35.4 % (ref 35.0–47.0)
Hemoglobin: 11.8 g/dL — ABNORMAL LOW (ref 12.0–16.0)
MCH: 29.9 pg (ref 26.0–34.0)
MCHC: 33.2 g/dL (ref 32.0–36.0)
MCV: 89.8 fL (ref 80.0–100.0)
PLATELETS: 245 10*3/uL (ref 150–440)
RBC: 3.94 MIL/uL (ref 3.80–5.20)
RDW: 13.6 % (ref 11.5–14.5)
WBC: 6.6 10*3/uL (ref 3.6–11.0)

## 2017-05-04 LAB — TROPONIN I

## 2017-05-04 LAB — TSH: TSH: 1.386 u[IU]/mL (ref 0.350–4.500)

## 2017-05-04 MED ORDER — SUMATRIPTAN 50 MG TABLET
ORAL_TABLET | 0 refills | 0 days | Status: CP
Start: 2017-05-04 — End: 2017-05-29

## 2017-05-04 MED ORDER — OMEPRAZOLE 20 MG CAPSULE,DELAYED RELEASE
ORAL_CAPSULE | 0 refills | 0 days | Status: CP
Start: 2017-05-04 — End: 2017-05-29

## 2017-05-04 MED ORDER — CLOTRIMAZOLE 1 % TOPICAL CREAM
0 refills | 0 days | Status: CP
Start: 2017-05-04 — End: 2017-05-29

## 2017-05-04 MED ORDER — DESIPRAMINE 100 MG TABLET
ORAL_TABLET | 0 refills | 0 days | Status: CP
Start: 2017-05-04 — End: 2017-05-29

## 2017-05-04 MED ORDER — T.E.D. BELOW KNEE/MEDIUM MISC: 1.0000 "application " | Freq: Every day | 0 refills | Status: DC

## 2017-05-04 MED FILL — BIKTARVY/50-200-25MG/TABS: BIKTARVY/50-200-25MG/TABS | 30 days supply | Qty: 30 | Fill #2

## 2017-05-04 NOTE — ED Provider Notes (Signed)
[facility] Emergency Department Provider Note    None    (approximate)  I have reviewed the triage vital signs and the nursing notes.   HISTORY  Chief Complaint Leg Swelling    HPI Amber Key is a 31 y.o. female with a history of HIV and lymphoma presents with lower extremity swelling that started last night. Patient denies any trauma. States that she is having some achiness in her legs. Denies any chest pain or shortness of breath. Has never had these symptoms before. Denies any orthopnea. Denies any fevers. States that she does sit a lot at work and the swelling was worse at night. States she has been having some achiness in her legs. The swelling seems to be in both lower extremities. She does admit to recreational marijuana use but no smoking. Is not on any birth control. No previous history of DVT.   Past Medical History:  Diagnosis Date  . Enlarged lymph nodes unk  . HIV (human immunodeficiency virus infection) ([place])    diagnosed years ago at [facility]  . Lymphoma ([place])   . Memory loss   . Migraines   . Pseudotumor cerebri unk  . Vision loss    Family History  Problem Relation Age of Onset  . Ovarian cancer Mother   . Breast cancer Mother   . Ovarian cancer Maternal Aunt   . Diabetes Maternal Aunt   . Hypertension Maternal Aunt   . Ovarian cancer Maternal Grandmother   . Breast cancer Maternal Grandmother   . Colon cancer Neg Hx    Past Surgical History:  Procedure Laterality Date  . COLONOSCOPY    . LUMBAR PUNCTURE    . UPPER GI ENDOSCOPY    . wisdom teeth     Patient Active Problem List   Diagnosis Date Noted  . Abnormal mammogram 06/26/2014  . Adenopathy 02/11/2014  . Human immunodeficiency virus (HIV) infection ([place]) 02/10/2014  . Headache, migraine 12/13/2013  . Benign intracranial hypertension 12/13/2013      Prior to Admission medications   Medication Sig Start Date End Date Taking? Authorizing Provider  acetaZOLAMIDE  (DIAMOX) 500 MG capsule Take 500 mg by mouth 2 (two) times daily.    [provider]  bictegravir-emtricitabine-tenofovir AF (BIKTARVY) 50-200-25 MG TABS tablet Take by mouth daily.    [provider]  diclofenac sodium (VOLTAREN) 1 % GEL Apply 2 g topically 4 (four) times daily. 02/02/17   Laban Emperor, PA-C  diphenhydrAMINE (BENADRYL) 25 mg capsule Take 25 mg by mouth every 6 (six) hours as needed.    [provider]  naproxen (NAPROSYN) 500 MG tablet Take 500 mg by mouth 2 (two) times daily with a meal.    [provider]  promethazine (PHENERGAN) 25 MG tablet Take 25 mg by mouth every 6 (six) hours as needed for nausea or vomiting.    [provider]  SUMAtriptan (IMITREX) 50 MG tablet Take 50 mg by mouth every 2 (two) hours as needed for migraine. May repeat in 2 hours if headache persists or recurs.    [provider]    Allergies Peanut-containing drug products    Social History Social History  Substance Use Topics  . Smoking status: Never Smoker  . Smokeless tobacco: Never Used  . Alcohol use Yes     Comment: occas    Review of Systems Patient denies headaches, rhinorrhea, blurry vision, numbness, shortness of breath, chest pain, edema, cough, abdominal pain, nausea, vomiting, diarrhea, dysuria,  fevers, rashes or hallucinations unless otherwise stated above in HPI. ____________________________________________   PHYSICAL EXAM:  VITAL SIGNS: Vitals:   05/04/17 1121  BP: (!) 125/94  Pulse: 88  Resp: 18  Temp: 99.3 F (37.4 C)    Constitutional: Alert and oriented. Well appearing and in no acute distress. Eyes: Conjunctivae are normal.  Head: Atraumatic. Nose: No congestion/rhinnorhea. Mouth/Throat: Mucous membranes are moist.   Neck: No stridor. Painless ROM.  Cardiovascular: Normal rate, regular rhythm. Grossly normal heart sounds.  Good peripheral circulation. Respiratory: Normal respiratory effort.  No  retractions. Lungs CTAB. Gastrointestinal: Soft and nontender. No distention. No abdominal bruits. No CVA tenderness. Genitourinary:  Musculoskeletal: No lower extremity tenderness,  1+ bilateral LE edema.  No joint effusions. Neurologic:  Normal speech and language. No gross focal neurologic deficits are appreciated. No facial droop Skin:  Skin is warm, dry and intact. No rash noted. Psychiatric: Mood and affect are normal. Speech and behavior are normal.  ____________________________________________   LABS (all labs ordered are listed, but only abnormal results are displayed)  Results for orders placed or performed during the hospital encounter of 05/04/17 (from the past 24 hour(s))  Basic metabolic panel     Status: Abnormal   Collection Time: 05/04/17 11:30 AM  Result Value Ref Range   Sodium 140 135 - 145 mmol/L   Potassium 3.4 (L) 3.5 - 5.1 mmol/L   Chloride 108 101 - 111 mmol/L   CO2 26 22 - 32 mmol/L   Glucose, Bld 95 65 - 99 mg/dL   BUN 8 6 - 20 mg/dL   Creatinine, Ser 0.71 0.44 - 1.00 mg/dL   Calcium 9.0 8.9 - 10.3 mg/dL   GFR calc non Af Amer >60 >60 mL/min   GFR calc Af Amer >60 >60 mL/min   Anion gap 6 5 - 15  CBC     Status: Abnormal   Collection Time: 05/04/17 11:30 AM  Result Value Ref Range   WBC 6.6 3.6 - 11.0 K/uL   RBC 3.94 3.80 - 5.20 MIL/uL   Hemoglobin 11.8 (L) 12.0 - 16.0 g/dL   HCT 35.4 35.0 - 47.0 %   MCV 89.8 80.0 - 100.0 fL   MCH 29.9 26.0 - 34.0 pg   MCHC 33.2 32.0 - 36.0 g/dL   RDW 13.6 11.5 - 14.5 %   Platelets 245 150 - 440 K/uL  Troponin I     Status: None   Collection Time: 05/04/17 11:30 AM  Result Value Ref Range   Troponin I <0.03 <0.03 ng/mL   ____________________________________________  EKG My review and personal interpretation at Time: 11:34   Indication: edema  Rate: 80  Rhythm: sinus Axis: normal Other: normal intervals, non specific st changes no stemi ____________________________________________  RADIOLOGY I personally  reviewed all radiographic images ordered to evaluate for the above acute complaints and reviewed radiology reports and findings.  These findings were personally discussed with the patient.  Please see medical record for radiology report.   ____________________________________________   PROCEDURES  Procedure(s) performed:  Procedures    Critical Care performed: no ____________________________________________   INITIAL IMPRESSION / ASSESSMENT AND PLAN / ED COURSE  Pertinent labs & imaging results that were available during my care of the patient were reviewed by me and considered in my medical decision making (see chart for details).  DDX: dvt, renal disease, chf, edema, anasarca  Dawnmarie Niblack is a 31 y.o. who presents to the ED with bilateral lower extremity edema as described above. Patient  afebrile hemodynamic stable. She has no clinical evidence of congestive heart failure. Denies any chest pain or shortness of breath. Chest x-ray is clear. No evidence of proteinuria or renal insufficiency. No evidence of DVT. No evidence of hyperthyroid. This point I do feel the patient is appropriate for follow-up with her PCP.      ____________________________________________   FINAL CLINICAL IMPRESSION(S) / ED DIAGNOSES  Final diagnoses:  Leg edema      NEW MEDICATIONS STARTED DURING THIS VISIT:  New Prescriptions   No medications on file     Note:  This document was prepared using Dragon voice recognition software and may include unintentional dictation errors.    Merlyn Lot, MD 05/04/17 [phone]

## 2017-05-04 NOTE — ED Triage Notes (Signed)
Pt from home with swelling in her legs that started last night. Pt denies cardiac or renal history. Pt alert & oriented with NAD noted.

## 2017-05-04 NOTE — ED Notes (Signed)
In ultrasound

## 2017-05-05 ENCOUNTER — Emergency Department
Admission: EM | Admit: 2017-05-05 | Discharge: 2017-05-05 | Disposition: A | Discharge status: Home / Self Care | Payer: MEDICAID | Source: Intra-hospital

## 2017-05-05 ENCOUNTER — Emergency Department
Admission: EM | Admit: 2017-05-05 | Discharge: 2017-05-05 | Disposition: A | Discharge status: Home / Self Care | Source: Intra-hospital

## 2017-05-05 DIAGNOSIS — M7989 Other specified soft tissue disorders: Principal | ICD-10-CM

## 2017-05-05 DIAGNOSIS — M79604 Pain in right leg: Secondary | ICD-10-CM

## 2017-05-05 DIAGNOSIS — M79605 Pain in left leg: Secondary | ICD-10-CM

## 2017-05-05 LAB — URINALYSIS WITH CULTURE REFLEX
BILIRUBIN UA: NEGATIVE
GLUCOSE UA: NEGATIVE
KETONES UA: NEGATIVE
LEUKOCYTE ESTERASE UA: NEGATIVE
NITRITE UA: NEGATIVE
PH UA: 7 (ref 5.0–9.0)
PROTEIN UA: NEGATIVE
SPECIFIC GRAVITY UA: 1.02 (ref 1.005–1.040)
SQUAMOUS EPITHELIAL: 18 /HPF — ABNORMAL HIGH (ref 0–5)
UROBILINOGEN UA: 0.2
WBC UA: 8 /HPF — ABNORMAL HIGH (ref 0–5)

## 2017-05-05 LAB — CBC W/ AUTO DIFF
BASOPHILS ABSOLUTE COUNT: 0 10*9/L (ref 0.0–0.1)
EOSINOPHILS ABSOLUTE COUNT: 0.3 10*9/L (ref 0.0–0.4)
HEMATOCRIT: 35.3 % — ABNORMAL LOW (ref 36.0–46.0)
HEMOGLOBIN: 11.7 g/dL — ABNORMAL LOW (ref 13.5–16.0)
LARGE UNSTAINED CELLS: 1 % (ref 0–4)
MEAN CORPUSCULAR HEMOGLOBIN CONC: 33.2 g/dL (ref 31.0–37.0)
MEAN CORPUSCULAR HEMOGLOBIN: 30.6 pg (ref 26.0–34.0)
MEAN CORPUSCULAR VOLUME: 92.1 fL (ref 80.0–100.0)
MONOCYTES ABSOLUTE COUNT: 0.3 10*9/L (ref 0.2–0.8)
NEUTROPHILS ABSOLUTE COUNT: 4.5 10*9/L (ref 2.0–7.5)
PLATELET COUNT: 257 10*9/L (ref 150–440)
RED CELL DISTRIBUTION WIDTH: 13.9 % (ref 12.0–15.0)
WBC ADJUSTED: 8.3 10*9/L (ref 4.5–11.0)

## 2017-05-05 LAB — BASIC METABOLIC PANEL
BUN / CREAT RATIO: 15
CALCIUM: 9.1 mg/dL (ref 8.5–10.2)
CHLORIDE: 107 mmol/L (ref 98–107)
CO2: 28 mmol/L (ref 22.0–30.0)
CREATININE: 0.65 mg/dL (ref 0.60–1.00)
EGFR MDRD AF AMER: >=60 mL/min/{1.73_m2} (ref >=60)
EGFR MDRD NON AF AMER: >=60 mL/min/{1.73_m2} (ref >=60)
GLUCOSE RANDOM: 113 mg/dL (ref 65–179)
SODIUM: 145 mmol/L (ref 135–145)

## 2017-05-05 LAB — CO2: Carbon dioxide:SCnc:Pt:Ser/Plas:Qn:: 28

## 2017-05-05 LAB — WBC UA: Lab: 8 — ABNORMAL HIGH

## 2017-05-05 LAB — HIV RNA, QUANTITATIVE, PCR: HIV RNA: <40 {copies}/mL — ABNORMAL HIGH (ref <0)

## 2017-05-05 LAB — BASOPHILS ABSOLUTE COUNT: Lab: 0

## 2017-05-05 LAB — HIV RNA QNT RSLT: HIV 1 RNA:PrThr:Pt:Ser/Plas:Ord:Probe.amp.tar: DETECTED — AB

## 2017-05-05 LAB — BETA HCG QUANTITATIVE: Choriogonadotropin.beta subunit:ACnc:Pt:Ser/Plas:Qn:: <5

## 2017-05-05 MED ORDER — FUROSEMIDE 20 MG TABLET
ORAL_TABLET | Freq: Every day | ORAL | 0 refills | 0.00000 days | Status: CP
Start: 2017-05-05 — End: 2018-09-01

## 2017-05-05 MED ORDER — OXYCODONE-ACETAMINOPHEN 5 MG-325 MG TABLET
ORAL_TABLET | Freq: Three times a day (TID) | ORAL | 0 refills | 0 days | Status: CP | PRN
Start: 2017-05-05 — End: 2017-05-10

## 2017-05-06 ENCOUNTER — Other Ambulatory Visit: Payer: Self-pay

## 2017-05-06 ENCOUNTER — Emergency Department: Payer: 59

## 2017-05-06 ENCOUNTER — Emergency Department
Admission: EM | Admit: 2017-05-06 | Discharge: 2017-05-06 | Disposition: A | Discharge status: Home / Self Care | Payer: 59 | Attending: Emergency Medicine | Admitting: Emergency Medicine

## 2017-05-06 DIAGNOSIS — Z5321 Procedure and treatment not carried out due to patient leaving prior to being seen by health care provider: Secondary | ICD-10-CM | POA: Diagnosis not present

## 2017-05-06 DIAGNOSIS — R079 Chest pain, unspecified: Secondary | ICD-10-CM | POA: Diagnosis not present

## 2017-05-06 DIAGNOSIS — R2243 Localized swelling, mass and lump, lower limb, bilateral: Secondary | ICD-10-CM | POA: Insufficient documentation

## 2017-05-06 LAB — BASIC METABOLIC PANEL
ANION GAP: 6 (ref 5–15)
BUN: 12 mg/dL (ref 6–20)
CALCIUM: 8.8 mg/dL — AB (ref 8.9–10.3)
CO2: 27 mmol/L (ref 22–32)
Chloride: 106 mmol/L (ref 101–111)
Creatinine, Ser: 0.79 mg/dL (ref 0.44–1.00)
Glucose, Bld: 95 mg/dL (ref 65–99)
POTASSIUM: 3.4 mmol/L — AB (ref 3.5–5.1)
Sodium: 139 mmol/L (ref 135–145)

## 2017-05-06 LAB — CBC
HEMATOCRIT: 34.6 % — AB (ref 35.0–47.0)
HEMOGLOBIN: 11.6 g/dL — AB (ref 12.0–16.0)
MCH: 29.9 pg (ref 26.0–34.0)
MCHC: 33.6 g/dL (ref 32.0–36.0)
MCV: 88.9 fL (ref 80.0–100.0)
Platelets: 241 10*3/uL (ref 150–440)
RBC: 3.89 MIL/uL (ref 3.80–5.20)
RDW: 13.8 % (ref 11.5–14.5)
WBC: 6.9 10*3/uL (ref 3.6–11.0)

## 2017-05-06 LAB — TROPONIN I

## 2017-05-06 LAB — BRAIN NATRIURETIC PEPTIDE: B NATRIURETIC PEPTIDE 5: 41 pg/mL (ref 0.0–100.0)

## 2017-05-06 NOTE — ED Notes (Addendum)
Pt and family member agitated, verbally aggressive with staff. Pt states "You don't understand pain like this. Nobody is helping me." Pt's family member left room saying he is "going to get a damn wheelchair." Pt states "Ya'll haven't done nothing for this pain. I don't need this nonchalant attitude from ya'll so he's going to take me somewhere else where they fucking do something for me you fucking bastards." Asked pt if she would wait a moment so nurse could ask MD to come to room, pt refused. EDP and Charge nurse Brandy notified pt wishes to leave AMA. Pt appears tense with clenched fists, glaring at staff, refused to allow further nursing assessment or to sign AMA document. Pt wheeled out by family member, A&Ox4, pt transferred self into wheelchair without assistance. EDP and Charge RN Brandy informed pt still wants to leave AMA and is on her way out of the ED with family.

## 2017-05-06 NOTE — ED Triage Notes (Signed)
Pt arrives to ER via POV, she drove herself, c/o chest pain and bilateral leg swelling X 3 days. Pt states she is unable to walk. Pt appears anxious about leg swelling. Reports she was prescribed lasix yesterday and took 1 dose. CP this AM. Pt on phone during time of triage.

## 2017-05-06 NOTE — Unmapped (Signed)
Pt with swelling to BLE x 3days. Pt endorses increased swelling and pain. Pt states this was a sudden onset without trauma/injury,change in diet or meds.

## 2017-05-06 NOTE — Unmapped (Signed)
Patient hyperventilating and screaming at triage. Person with patient states she is having severe bilateral lower leg pain and swelling and she feels like her legs are going to burst open - Patient placed in treatment room. Triage nurse assisted patient to slow breathing.

## 2017-05-06 NOTE — Unmapped (Signed)
PT ambulated to bathroom by self. Stable gait. Pain better controlled at this time.

## 2017-05-06 NOTE — Unmapped (Signed)
Emergency Department Provider Note    MDM & Course     Morgan Hebert is a 31 y.o. female with a PMH of HIV who presents to the ED for evaluation of bilateral leg swelling onset x3 days ago with associated bilateral leg pain. On exam patient has 1+ pitting edema to BLE through mid calf, symmetric bilateral swelling. No calf tenderness in BLE. No facial or periorbital swelling. Patient seen at OSH ED for same complaint yesterday w/ negative PVL study. Plan to dc with oxycodone and f/u with PCP.    I have reviewed the recent controlled substance prescription history available for this patient in the [place] Controlled Substance Reporting System. The patient is being prescribed a brief course (no more than a 5 day supply) of a opioid medication for an indication of acute pain.  Usual and appropriate instructions regarding the safe use of this type of medication has been provided.    History     HPI    Morgan Hebert is a 31 y.o. female with a PMH of HIV who presents to the ED for evaluation of bilateral leg swelling onset x3 days ago. Patient endorses onset of progressively worsening constant bilateral leg swelling with associated constant progressively worsening bilateral leg pain. Patient states her pain worsens with movement or ambulation and denies any alleviating factors. She denies any history of similar symptoms, fever, nausea, vomiting, chest pain, abdominal pain, SOB, or any other medical concerns at this time.        No recent surgery or immobility. Denies hx of DVT or PE. No hx of cancer. No early family hx of VTE.     ROS  CONST: See above  EYES: Negative for visual changes.  ECARD: Negative for chest pain.  RESP: Negative for shortness of breath. No hx of prior PE/DVT. No pleuritic type pain.  GI: Negative for abdominal pain, vomiting or diarrhea.  GU: Negative for dysuria.  MSK: Positive for x3 days of BLE swelling and pain.   SKIN: Negative for rash.      Physical Exam     CONST:  Alert and oriented. Well appearing and in no distress.  EYES: Normal conjunctiva, PERRL  ENT: No facial or periorbital swelling. Mucous membranes are moist. Clear pharynx.  HEME/LYMPH: No pallor  CV: S1S2 RRR. No M/R/G   RESP:  Normal respiratory effort. CTABL. No W/R/R  \MSK: 1+ pitting edema to BLE through mid calf, symmetric bilateral swelling. No calf tenderness in BLE. Nl ROM.   NEURO: Nl sensation and strength in BL LE  SKIN: Skin is warm, dry and intact. No rash noted.      Additional History     Past Medical History:   Diagnosis Date   ??? Abnormal mammogram    ??? Constipation    ??? Diarrhea    ??? Environmental allergies    ??? HIV (human immunodeficiency virus infection) (CMS-HCC)    ??? HIV (human immunodeficiency virus infection) (CMS-HCC)    ??? IUD (intrauterine device) in place    ??? Migraine    ??? Pseudotumor cerebri        Patient Active Problem List   Diagnosis   ??? Pseudotumor cerebri   ??? Migraines   ??? HIV   ??? Lymphadenopathy   ??? Abnormal mammogram   ??? Chronic headache       Past Surgical History:   Procedure Laterality Date   ??? LUMBAR PUNCTURE     ??? PR COLONOSCOPY W/BIOPSY SINGLE/MULTIPLE  07/03/2014    Procedure: COLONOSCOPY, FLEXIBLE, PROXIMAL TO SPLENIC FLEXURE; WITH BIOPSY, SINGLE OR MULTIPLE;  Surgeon: Billie Ruddy, MD;  [facility];  Service: Gastroenterology   ??? PR UPPER GI ENDOSCOPY,BIOPSY N/A 07/03/2014    Procedure: UGI ENDOSCOPY; WITH BIOPSY, SINGLE OR MULTIPLE;  Surgeon: Billie Ruddy, MD;  [facility];  Service: Gastroenterology       No current facility-administered medications for this encounter.     Current Outpatient Prescriptions:   ???  acetaZOLAMIDE (DIAMOX) 500 mg capsule, Take 3 capsules (1,500 mg total) by mouth Two (2) times a day., Disp: 180 capsule, Rfl: 0  ???  bictegrav-emtricit-tenofov ala (BIKTARVY) 50-200-25 mg tablet, Take 1 tablet by mouth daily., Disp: 30 tablet, Rfl: 11  ???  clotrimazole (LOTRIMIN) 1 % cream, APPLY TOPICALLY TO THE AFFECTED AREA(S) 2 TIMES A DAY, Disp: 30 g, Rfl: 0  ???  desipramine (NOPRAMIN) 100 MG tablet, TAKE 1 TABLET BY MOUTH EVERY NIGHT AT BEDTIME AS TOLERATED, Disp: 30 tablet, Rfl: 0  ???  diclofenac sodium (VOLTAREN) 1 % gel, Apply 2 g topically., Disp: , Rfl:   ???  dicyclomine (BENTYL) 10 mg capsule, TAKE 1 CAPSULE BY MOUTH TWO TIMES A DAY. (Patient not taking: Reported on 05/03/2017), Disp: 60 capsule, Rfl: 0  ???  diphenhydrAMINE (BENADRYL) 25 mg capsule, Take 25 mg by mouth., Disp: , Rfl:   ???  naproxen (NAPROSYN) 500 MG tablet, TAKE ONE TABLET BY MOUTH 2 TIMES A DAY AS NEEDED HEADACHE, Disp: 30 tablet, Rfl: 0  ???  omeprazole (PRILOSEC) 20 MG capsule, TAKE 1 CAPSULE BY MOUTH EVERY MORNING BEFORE BREAKFAST., Disp: 30 capsule, Rfl: 0  ???  promethazine (PHENERGAN) 25 MG tablet, TAKE ONE TABLET BY MOUTH EVERY 8 HOURS AS NEEDED FOR NAUSEA, Disp: 30 tablet, Rfl: 0  ???  SUMAtriptan (IMITREX) 50 MG tablet, TAKE ONE TABLET BY MOUTH AT ONSET OF HEADACHE AND IF NEEDED, REPEAT ONCE IN 2 HOURS. MAX OF 2 DOSES/DAY AND 4 DOSES/WEEK, Disp: 9 tablet, Rfl: 0    Allergies  Peanut    Family History   Problem Relation Age of Onset   ??? Cervical cancer Mother    ??? Diabetes Mother    ??? Asthma Mother    ??? Diabetes Maternal Aunt    ??? Breast cancer Maternal Grandmother    ??? Cancer Maternal Grandmother    ??? No Known Problems Daughter    ??? Glaucoma Neg Hx        Social History  Social History   Substance Use Topics   ??? Smoking status: Former Smoker     Types: Cigarettes   ??? Smokeless tobacco: Never Used   ??? Alcohol use 0.0 oz/week      Comment: rare          Attest     _________________________________  Documentation assistance was provided by Teddy Spike, Scribe, on May 05, 2017 at 8:22 PM for Sherlene Shams, MD    Documentation assistance was provided by the scribe in my presence.  The documentation recorded by the scribe has been reviewed by me and accurately reflects the services I personally performed.     Laurance Flatten, MD  05/08/17 2225

## 2017-05-06 NOTE — Unmapped (Signed)
PT states MD will give remaining dose of percocet to patient prior to discharge. No orders placed. Will report to Md.

## 2017-05-08 NOTE — Unmapped (Signed)
Participant had a follow-up visit for the [facility] Study on 03/29/17.   GYN: All prevoius WIHS pap smears negative, but patient needs pap per protocol to complete 3 years of follow up. LMP: 02/17/17. No vaginal symptoms.  Gyn Exam: Normal external genitalia and vagina. Cervix without lesions. Pap collected. Wet prep done and negative.   Labs Collected: Chemistries, CBC w/diff, CD4, VL, fasting glucose, HgA1C, fasting lipids.   [facility] labs received and in EPIC:  Chemistry: WNL  CBC: WNL  Pap received and entered as external result: NILM    All labs results will be documented in Epic.

## 2017-05-12 MED ORDER — ACETAZOLAMIDE ER 500 MG CAPSULE,EXTENDED RELEASE
ORAL_CAPSULE | 0 refills | 0 days | Status: CP
Start: 2017-05-12 — End: 2018-02-28

## 2017-05-24 MED ORDER — NAPROXEN 500 MG TABLET
ORAL_TABLET | 0 refills | 0 days | Status: CP
Start: 2017-05-24 — End: 2017-08-25

## 2017-05-24 MED ORDER — PROMETHAZINE 25 MG TABLET
ORAL_TABLET | 0 refills | 0 days | Status: CP
Start: 2017-05-24 — End: 2017-07-06

## 2017-05-29 MED ORDER — SUMATRIPTAN 50 MG TABLET
ORAL_TABLET | 0 refills | 0 days | Status: CP
Start: 2017-05-29 — End: 2017-07-06

## 2017-05-29 MED ORDER — OMEPRAZOLE 20 MG CAPSULE,DELAYED RELEASE
ORAL_CAPSULE | 0 refills | 0 days | Status: CP
Start: 2017-05-29 — End: 2018-03-21

## 2017-05-29 MED ORDER — DESIPRAMINE 100 MG TABLET
ORAL_TABLET | 0 refills | 0 days | Status: CP
Start: 2017-05-29 — End: 2018-03-21

## 2017-05-29 MED ORDER — CLOTRIMAZOLE 1 % TOPICAL CREAM
0 refills | 0 days | Status: CP
Start: 2017-05-29 — End: 2017-05-30

## 2017-05-30 MED ORDER — CLOTRIMAZOLE 1 % TOPICAL CREAM
0 refills | 0 days | Status: CP
Start: 2017-05-30 — End: 2017-07-06

## 2017-06-01 MED FILL — BIKTARVY/50-200-25MG/TABS: BIKTARVY/50-200-25MG/TABS | 30 days supply | Qty: 30 | Fill #3

## 2017-06-01 NOTE — Unmapped (Signed)
Patient asked about bilateral swelling that has continued. I advised she could limit sodium intake (I don't eat salt and I cook all of my meals) and could use compression hose (I use them already). I then encouraged her to follow up with PCP as advised from ER (I don't have a doctor).    [name] Refill Coordination Note  Specialty Medication(s): Biktarvy  Additional Medications shipped: na    Morgan Hebert, DOB: April 19, 1986  Phone: [phone] (home) , Alternate phone contact: N/A  Phone or address changes today?: No  All above HIPAA information was verified with patient.  Shipping Address: [address]  [place] [address]   Insurance changes? No    Completed refill call assessment today to schedule patient's medication shipment from the [facility] [phone]).      Confirmed the medication and dosage are correct and have not changed: Yes, regimen is correct and unchanged.    Confirmed patient started or stopped the following medications in the past month:  No, there are no changes reported at this time.    Are you tolerating your medication?:  Morgan Hebert reports tolerating the medication. She does report swelling as above, but does not appear to be related to [employer].    ADHERENCE    Did you miss any doses in the past 4 weeks? No missed doses reported.    FINANCIAL/SHIPPING    Delivery Scheduled: Yes, Expected medication delivery date: Friday, Aug 24     Morgan Hebert did not have any additional questions at this time.    Delivery address validated in FSI scheduling system: Yes, address listed in FSI is correct.    We will follow up with patient monthly for standard refill processing and delivery.      Thank you,  Tawanna Solo Shared [facility] Specialty Pharmacist

## 2017-06-26 NOTE — Unmapped (Signed)
[facility] Refill Coordination Note  Medication: BIKTARVY    Unable to reach patient to schedule shipment for medication being filled at [facility]. Left voicemail on phone.  As this is the 3rd unsuccessful attempt to reach the patient, no additional phone call attempts will be made at this time.      Phone numbers attempted: [phone]- EC WILL HAVE PT CALL us AS HE DOES NOT MANAGE PTS MEDS AT ALL  [phone]- LEFT VMS FOR PT  Last scheduled delivery: SHIPPED 06/01/17    Please call the [facility] at [phone] (option 4) should you have any further questions.      Thanks,  [facility] Shared [facility] Specialty Team

## 2017-06-29 NOTE — Unmapped (Signed)
[facility] Pharm[facility] Refill Coordination Note  Medication: Biktarvy  ??  Attempted to contact patient to schedule shipment for medication being filled at [facility]. Left voicemail message with instructions to call pharmacy to set up refill. Will continue to try and reach patient.    Last Shipped: 06/01/17    Alma Downs  Specialty Pharmacy Intern  [facility]

## 2017-07-06 MED ORDER — SUMATRIPTAN 50 MG TABLET
ORAL_TABLET | 0 refills | 0 days | Status: CP
Start: 2017-07-06 — End: 2017-08-04

## 2017-07-06 MED ORDER — PROMETHAZINE 25 MG TABLET
ORAL_TABLET | 0 refills | 0 days | Status: CP
Start: 2017-07-06 — End: 2017-08-04

## 2017-07-06 MED ORDER — ACETAZOLAMIDE ER 500 MG CAPSULE,EXTENDED RELEASE
ORAL_CAPSULE | 0 refills | 0 days | Status: CP
Start: 2017-07-06 — End: 2018-02-28

## 2017-07-07 MED ORDER — CLOTRIMAZOLE 1 % TOPICAL CREAM
Freq: Two times a day (BID) | TOPICAL | 0 refills | 0 days | Status: CP
Start: 2017-07-07 — End: 2018-03-21

## 2017-07-10 NOTE — Unmapped (Signed)
[facility] Refill and Clinical Coordination Note  Medication(s): Morgan Hebert, DOB: [birth year]  Phone: [phone] (home) , Alternate phone contact: N/A  Shipping address: [address]  [place] [address]  Phone or address changes today?: No  All above HIPAA information verified.  Insurance changes? No    Completed refill and clinical call assessment today to schedule patient's medication shipment from the [facility] [phone]).      MEDICATION RECONCILIATION    Confirmed the medication and dosage are correct and have not changed: Yes, regimen is correct and unchanged.    Were there any changes to your medication(s) in the past month:  No, there are no changes reported at this time.    ADHERENCE    Is this medicine transplant or covered by Medicare Part B? No.      Did you miss any doses in the past 4 weeks? No missed doses reported.  Adherence counseling provided? Not needed     SIDE EFFECT MANAGEMENT    Are you tolerating your medication?:  Morgan Hebert reports tolerating the medication.  Side effect management discussed: None      Therapy is appropriate and should be continued.    Evidence of clinical benefit: See Epic note from 05/03/17      FINANCIAL/SHIPPING    Delivery Scheduled: Unable to schedule delivery at this time.  Patient's Medicaid has changed to [employer], so Morgan Hebert is not covered.  Patient will follow up with case worker for a resolution and call us back to schedule delivery.   Additional medications refilled: No additional medications/refills needed at this time.    Morgan Hebert did not have any additional questions at this time.    Delivery address validated in FSI scheduling system: Yes, address listed above is correct.      We will follow up with patient monthly for standard refill processing and delivery.      Thank you,  Clydell Hakim   [facility] Shared [facility] Specialty Pharmacist

## 2017-07-24 LAB — HEMOGLOBIN A1C: HEMOGLOBIN A1C: 5.3 %

## 2017-07-24 LAB — LIPID PANEL
CHOLESTEROL/HDL RATIO SCREEN: 3.5
CHOLESTEROL: 139 mg/dL
NON-HDL CHOLESTEROL: 99 mg/dL
TRIGLYCERIDES: 101 mg/dL

## 2017-07-24 LAB — LDL CHOLESTEROL CALCULATED: Lab: 79

## 2017-07-24 LAB — ESTIMATED AVERAGE GLUCOSE: Lab: 0

## 2017-07-24 NOTE — Unmapped (Signed)
[facility] lab results received and reviewed. Entered into Epic as external result: hgba1c and cholesterol panel entered.

## 2017-07-27 LAB — HIV RNA: Lab: <20

## 2017-07-27 LAB — HIV RNA, QUANTITATIVE, PCR: HIV RNA: <20

## 2017-07-27 NOTE — Unmapped (Signed)
[facility] lab results received and reviewed. Entered into Epic as external result: HIV RNA result entered.

## 2017-07-28 MED ORDER — BICTEGRAVIR 50 MG-EMTRICITABINE 200 MG-TENOFOVIR ALAFENAM 25 MG TABLET: 1 | tablet | Freq: Every day | 11 refills | 0 days | Status: AC

## 2017-07-28 MED ORDER — BICTEGRAVIR 50 MG-EMTRICITABINE 200 MG-TENOFOVIR ALAFENAM 25 MG TABLET: 1 | tablet | 11 refills | 0 days

## 2017-07-28 MED ORDER — BICTEGRAVIR 50 MG-EMTRICITABINE 200 MG-TENOFOVIR ALAFENAM 25 MG TABLET
ORAL_TABLET | Freq: Every day | ORAL | 11 refills | 0.00000 days | Status: CP
Start: 2017-07-28 — End: 2018-08-02

## 2017-07-28 NOTE — Unmapped (Signed)
[facility] Special[facility] Pharm[facility] Refill Coordination Note  Medication: BIKTARVY    Unable to schedule delivery at this time.  Patient's Medicaid has changed to [facility], so Susanne Borders is not covered.  Patient will follow up with case worker for a resolution and call us back to schedule delivery. She has not done so after 5 messages. As this is the 5th unsuccessful attempt to reach the patient, no additional phone call attempts will be made at this time.      Phone numbers attempted: [phone] (Mobile) *Preferred*  Last scheduled delivery: 06/01/17    Please call the [facility] at [phone] (option 4) should you have any further questions.      Thanks,  [facility] Shared [employer] [facility] Specialty Team

## 2017-07-31 ENCOUNTER — Emergency Department
Admission: EM | Admit: 2017-07-31 | Discharge: 2017-07-31 | Disposition: A | Discharge status: Home / Self Care | Payer: MEDICAID | Source: Intra-hospital | Attending: Family | Admitting: Family

## 2017-07-31 NOTE — Unmapped (Signed)
Echocardiogram performed on 07/31/17 as part of the [facility] Study (Study:[id number])

## 2017-08-01 NOTE — Unmapped (Signed)
Patient in MT1 with complaint of headache for 4 days denies fever. Patient stated she have history of migraine, kept room dark, on semi high back rest, call bell within reach, family at bedside, waiting to be seen by provider

## 2017-08-01 NOTE — Unmapped (Signed)
Pt ambulatory to triage and c/o a migraine associated with pressure in head/neck/eyes, and photophobia x4 days. Has tried  Promethazine, Imitrex, and naproxen w/o relief of symptoms

## 2017-08-01 NOTE — Unmapped (Signed)
Emergency Department Provider Note        ED Clinical Impression     Final diagnoses:   Other migraine with status migrainosus, intractable (Primary)       ED Assessment/Plan   Patient is a 31 y.o. female with PMH of HIV and migraines presenting to the ED for worsening headache over the past 4 days with associated photophobia and nausea.    On exam, the patient appears well and is in NAD. No red flags on neuro exam. VS are WNL. PERRL.     Likely a migraine given hx of similar symptoms.     Plan for headache cocktail and reassess.     Patient states that she feels much better and would like to be discharged home.  Return precautions discussed.     History     Chief Complaint   Patient presents with   ??? Headache Recurrent or Known Dx Migraines     HPI   Morgan Hebert is a 31 y.o. female with PMH of HIV and migraines presenting to the ED for migraine. Patient reports worsening headache over the past 4 days with associated photophobia and nausea. She states her symptoms are similar to previous migraines in the past however her promethazine and Imitrex have been unable to treat it. Patient denies fevers, vomiting, or any other medical concerns at this time.     Past Medical History:   Diagnosis Date   ??? Abnormal mammogram    ??? Constipation    ??? Diarrhea    ??? Environmental allergies    ??? HIV (human immunodeficiency virus infection) (CMS-HCC)    ??? HIV (human immunodeficiency virus infection) (CMS-HCC)    ??? IUD (intrauterine device) in place    ??? Migraine    ??? Pseudotumor cerebri        Past Surgical History:   Procedure Laterality Date   ??? LUMBAR PUNCTURE     ??? PR COLONOSCOPY W/BIOPSY SINGLE/MULTIPLE  07/03/2014    Procedure: COLONOSCOPY, FLEXIBLE, PROXIMAL TO SPLENIC FLEXURE; WITH BIOPSY, SINGLE OR MULTIPLE;  Surgeon: Billie Ruddy, MD;  Location: [facility];  Service: Gastroenterology   ??? PR UPPER GI ENDOSCOPY,BIOPSY N/A 07/03/2014    Procedure: UGI ENDOSCOPY; WITH BIOPSY, SINGLE OR MULTIPLE; Surgeon: Billie Ruddy, MD;  Location: [facility];  Service: Gastroenterology       Family History   Problem Relation Age of Onset   ??? Cervical cancer Mother    ??? Diabetes Mother    ??? Asthma Mother    ??? Diabetes Maternal Aunt    ??? Breast cancer Maternal Grandmother    ??? Cancer Maternal Grandmother    ??? No Known Problems Daughter    ??? Glaucoma Neg Hx        Social History     Social History   ??? Marital status: Married     Spouse name: N/A   ??? Number of children: N/A   ??? Years of education: N/A     Social History Main Topics   ??? Smoking status: Former Smoker     Types: Cigarettes   ??? Smokeless tobacco: Never Used   ??? Alcohol use 0.0 oz/week      Comment: rare   ??? Drug use: Yes     Types: Marijuana   ??? Sexual activity: Yes     Partners: Male     Other Topics Concern   ??? None     Social History Narrative  Patient lives in [place], [place] with her fiance and daughter.  She is not employed.       Review of Systems   Gastrointestinal: Positive for nausea.   Neurological: Positive for headaches.   All other systems reviewed and are negative.      Physical Exam     BP 105/65  - Pulse 81  - Temp 36.6 ??C (97.9 ??F) (Oral)  - Resp 17  - Ht 149.9 cm (4' 11)  - Wt 78 kg (172 lb)  - LMP 07/14/2017  - SpO2 97%  - BMI 34.74 kg/m??     Physical Exam   Constitutional: She is oriented to person, place, and time. She appears well-developed and well-nourished.   HENT:   Head: Normocephalic.   Eyes: Pupils are equal, round, and reactive to light.   Neck: Normal range of motion.   Cardiovascular: Normal rate, regular rhythm, normal heart sounds and intact distal pulses.    Pulmonary/Chest: Effort normal and breath sounds normal.   Abdominal: Soft. Bowel sounds are normal.   Musculoskeletal: Normal range of motion.   Neurological: She is alert and oriented to person, place, and time.   Skin: Skin is warm and dry.   Psychiatric: She has a normal mood and affect. Her behavior is normal. Judgment and thought content normal.   Nursing note and vitals reviewed.      ED Course           Coding     ______________________________________________________   Documentation assistance was provided by Rolan Lipa, Scribe, on July 31, 2017 at 9:11 PM for Laurena Slimmer, NP.     Documentation assistance was provided by the scribe in my presence.  The documentation recorded by the scribe has been reviewed by me and accurately reflects the services I personally performed.         Cleotilde Neer, NP  08/04/17 2200

## 2017-08-01 NOTE — Unmapped (Signed)
Jennifer at bedside

## 2017-08-01 NOTE — Unmapped (Signed)
Patient rounds complete. The following patient needs were addressed: pain, toileting,personal belonging, plan of care, call bell within reach,position bed in low

## 2017-08-08 MED ORDER — PROMETHAZINE 25 MG TABLET
ORAL_TABLET | 0 refills | 0 days | Status: CP
Start: 2017-08-08 — End: 2018-05-11

## 2017-08-08 MED ORDER — SUMATRIPTAN 50 MG TABLET
ORAL_TABLET | 0 refills | 0 days | Status: CP
Start: 2017-08-08 — End: 2018-05-11

## 2017-08-25 ENCOUNTER — Emergency Department
Admission: EM | Admit: 2017-08-25 | Discharge: 2017-08-25 | Disposition: A | Discharge status: Home / Self Care | Payer: MEDICAID | Source: Intra-hospital | Attending: Family | Admitting: Family

## 2017-08-25 ENCOUNTER — Emergency Department
Admission: EM | Admit: 2017-08-25 | Discharge: 2017-08-25 | Disposition: A | Discharge status: Home / Self Care | Payer: MEDICAID | Source: Intra-hospital

## 2017-08-25 DIAGNOSIS — M7989 Other specified soft tissue disorders: Principal | ICD-10-CM

## 2017-08-25 MED ORDER — NAPROXEN 500 MG TABLET
ORAL_TABLET | Freq: Two times a day (BID) | ORAL | 0 refills | 0 days | Status: CP
Start: 2017-08-25 — End: 2018-03-21
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
## 2017-10-05 NOTE — Unmapped (Signed)
Referral for [employer] closed due to no response from patient via phone or no returned application after multiple attempts.

## 2018-01-01 ENCOUNTER — Encounter
Admit: 2018-01-01 | Discharge: 2018-01-01 | Disposition: A | Discharge status: Home / Self Care | Payer: BLUE CROSS/BLUE SHIELD

## 2018-01-02 ENCOUNTER — Encounter
Admit: 2018-01-02 | Discharge: 2018-01-03 | Disposition: A | Discharge status: Home / Self Care | Payer: BLUE CROSS/BLUE SHIELD

## 2018-01-02 ENCOUNTER — Encounter
Admit: 2018-01-02 | Discharge: 2018-01-03 | Disposition: A | Discharge status: Home / Self Care | Payer: PRIVATE HEALTH INSURANCE

## 2018-01-02 DIAGNOSIS — R51 Headache: Principal | ICD-10-CM

## 2018-02-23 ENCOUNTER — Other Ambulatory Visit: Payer: Self-pay

## 2018-02-23 ENCOUNTER — Emergency Department
Admission: EM | Admit: 2018-02-23 | Discharge: 2018-02-23 | Disposition: A | Discharge status: Home / Self Care | Payer: 59 | Attending: Emergency Medicine | Admitting: Emergency Medicine

## 2018-02-23 ENCOUNTER — Encounter: Payer: Self-pay | Admitting: Emergency Medicine

## 2018-02-23 DIAGNOSIS — G43001 Migraine without aura, not intractable, with status migrainosus: Secondary | ICD-10-CM

## 2018-02-23 DIAGNOSIS — Z79899 Other long term (current) drug therapy: Secondary | ICD-10-CM | POA: Insufficient documentation

## 2018-02-23 DIAGNOSIS — R112 Nausea with vomiting, unspecified: Secondary | ICD-10-CM | POA: Diagnosis not present

## 2018-02-23 DIAGNOSIS — Z91010 Allergy to peanuts: Secondary | ICD-10-CM | POA: Insufficient documentation

## 2018-02-23 DIAGNOSIS — R51 Headache: Secondary | ICD-10-CM | POA: Diagnosis present

## 2018-02-23 MED ORDER — METOCLOPRAMIDE HCL 5 MG/ML IJ SOLN
20.0000 mg | Freq: Once | INTRAVENOUS | Status: DC
  Filled 2018-02-23: qty 4

## 2018-02-23 MED ORDER — ONDANSETRON HCL 4 MG/2ML IJ SOLN
4.0000 mg | Freq: Once | INTRAMUSCULAR | Status: AC
  Administered 2018-02-23: 4 mg via INTRAVENOUS
  Filled 2018-02-23: qty 2

## 2018-02-23 MED ORDER — SODIUM CHLORIDE 0.9 % IV BOLUS
1000.0000 mL | Freq: Once | INTRAVENOUS | Status: AC
  Administered 2018-02-23: 1000 mL via INTRAVENOUS

## 2018-02-23 MED ORDER — KETOROLAC TROMETHAMINE 30 MG/ML IJ SOLN
30.0000 mg | Freq: Once | INTRAMUSCULAR | Status: AC
  Administered 2018-02-23: 30 mg via INTRAVENOUS
  Filled 2018-02-23: qty 1

## 2018-02-23 NOTE — Discharge Instructions (Addendum)
Follow-up with scheduled neurologist's appointment at [facility].  Continue previous medications.

## 2018-02-23 NOTE — ED Provider Notes (Signed)
[facility] Emergency Department Provider Note   ____________________________________________   First MD Initiated Contact with Patient 02/23/18 1009     (approximate)  I have reviewed the triage vital signs and the nursing notes.   HISTORY  Chief Complaint Migraine    HPI Amber Key is a 32 y.o. female patient presents with nausea/ vomiting and headache secondary to history of migraines.  Patient has history of pseudotumor cerebri and is scheduled to follow-up with her neurologist in 5 days.  Patient state had to leave work secondary to increase headache.  Patient rates her pain as a 9/10.  Patient described the pain is "achy".  No relief with previous medications.  Past Medical History:  Diagnosis Date  . Enlarged lymph nodes unk  . HIV (human immunodeficiency virus infection) ([place])    diagnosed years ago at [facility]  . Lymphoma ([place])   . Memory loss   . Migraines   . Pseudotumor cerebri unk  . Vision loss     Patient Active Problem List   Diagnosis Date Noted  . Abnormal mammogram 06/26/2014  . Adenopathy 02/11/2014  . Human immunodeficiency virus (HIV) infection ([place]) 02/10/2014  . Headache, migraine 12/13/2013  . Benign intracranial hypertension 12/13/2013    Past Surgical History:  Procedure Laterality Date  . COLONOSCOPY    . LUMBAR PUNCTURE    . UPPER GI ENDOSCOPY    . wisdom teeth      Prior to Admission medications   Medication Sig Start Date End Date Taking? Authorizing Provider  acetaZOLAMIDE (DIAMOX) 500 MG capsule Take 500 mg by mouth 2 (two) times daily.    [provider]  bictegravir-emtricitabine-tenofovir AF (BIKTARVY) 50-200-25 MG TABS tablet Take by mouth daily.    [provider]  diclofenac sodium (VOLTAREN) 1 % GEL Apply 2 g topically 4 (four) times daily. 02/02/17   Laban Emperor, PA-C  diphenhydrAMINE (BENADRYL) 25 mg capsule Take 25 mg by mouth every 6 (six) hours as needed.    [provider]  Elastic Bandages & Supports (T.E.D. BELOW KNEE/MEDIUM) MISC 1 application by Does not apply route daily. 05/04/17   Merlyn Lot, MD  naproxen (NAPROSYN) 500 MG tablet Take 500 mg by mouth 2 (two) times daily with a meal.    [provider]  promethazine (PHENERGAN) 25 MG tablet Take 25 mg by mouth every 6 (six) hours as needed for nausea or vomiting.    [provider]  SUMAtriptan (IMITREX) 50 MG tablet Take 50 mg by mouth every 2 (two) hours as needed for migraine. May repeat in 2 hours if headache persists or recurs.    [provider]    Allergies Peanut-containing drug products  Family History  Problem Relation Age of Onset  . Ovarian cancer Mother   . Breast cancer Mother   . Ovarian cancer Maternal Aunt   . Diabetes Maternal Aunt   . Hypertension Maternal Aunt   . Ovarian cancer Maternal Grandmother   . Breast cancer Maternal Grandmother   . Colon cancer Neg Hx     Social History Social History   Tobacco Use  . Smoking status: Never Smoker  . Smokeless tobacco: Never Used  Substance Use Topics  . Alcohol use: Yes    Comment: occas  . Drug use: Yes    Frequency: 7.0 times per week    Types: Marijuana    Comment: for migraines    Review of Systems Constitutional: No fever/chills Eyes: No visual changes.  ENT: No sore throat. Cardiovascular: Denies chest pain. Respiratory: Denies shortness of breath. Gastrointestinal: No abdominal pain.  Nausea and vomiting.  No diarrhea.  No constipation. Genitourinary: Negative for dysuria. Musculoskeletal: Negative for back pain. Skin: Negative for rash. Neurological: Positive for headaches, but denies focal weakness or numbness.  Allergic/Immunilogical: HIV. ____________________________________________   PHYSICAL EXAM:  VITAL SIGNS: ED Triage Vitals  Enc Vitals Group     BP 02/23/18 1006 132/90     Pulse Rate 02/23/18 1006 86     Resp 02/23/18 1006 16     Temp  02/23/18 1006 98 F (36.7 C)     Temp Source 02/23/18 1006 Oral     SpO2 02/23/18 1006 100 %     Weight 02/23/18 0955 163 lb (73.9 kg)     Height 02/23/18 0955 4\' 11"  (1.499 m)     Head Circumference --      Peak Flow --      Pain Score 02/23/18 0955 9     Pain Loc --      Pain Edu? --      Excl. in [place]? --     Constitutional: Alert and oriented. Well appearing and in no acute distress. Eyes: Conjunctivae are normal. PERRL. EOMI. no papilledema. Head: Atraumatic. Cardiovascular: Normal rate, regular rhythm. Grossly normal heart sounds.  Good peripheral circulation. Respiratory: Normal respiratory effort.  No retractions. Lungs CTAB. Gastrointestinal: Soft and nontender. No distention. No abdominal bruits. No CVA tenderness.  No active vomiting. Neurologic:  Normal speech and language. No gross focal neurologic deficits are appreciated. No gait instability. Skin:  Skin is warm, dry and intact. No rash noted. Psychiatric: Mood and affect are normal. Speech and behavior are normal.  ____________________________________________   LABS (all labs ordered are listed, but only abnormal results are displayed)  Labs Reviewed - No data to display ____________________________________________  EKG   ____________________________________________  RADIOLOGY  ED MD interpretation:    Official radiology report(s): No results found.  ____________________________________________   PROCEDURES  Procedure(s) performed: None  Procedures  Critical Care performed: No  ____________________________________________   INITIAL IMPRESSION / ASSESSMENT AND PLAN / ED COURSE  As part of my medical decision making, I reviewed the following data within the [place]    Patient presents with complaint of migraine headache.  Patient has a history of migraine headaches with pseudotumor Cerebri and HIV.  Patient responded well to IV normal saline, Toradol, Zofran, and Reglan.   Patient given discharge care instruction work note.  Patient advised to follow-up with scheduled neurology appointment next week.      ____________________________________________   FINAL CLINICAL IMPRESSION(S) / ED DIAGNOSES  Final diagnoses:  Migraine without aura and with status migrainosus, not intractable     ED Discharge Orders    None       Note:  This document was prepared using Dragon voice recognition software and may include unintentional dictation errors.    Sable Feil, PA-C 02/23/18 1136    Hinda Kehr, MD 02/24/18 [phone]

## 2018-02-23 NOTE — ED Triage Notes (Signed)
Says migraine with vomiting.  History of migraines.

## 2018-02-28 ENCOUNTER — Ambulatory Visit: Admit: 2018-02-28 | Discharge: 2018-03-01 | Payer: BLUE CROSS/BLUE SHIELD | Attending: Neurology | Primary: Neurology

## 2018-02-28 DIAGNOSIS — Z79899 Other long term (current) drug therapy: Secondary | ICD-10-CM

## 2018-02-28 DIAGNOSIS — G43909 Migraine, unspecified, not intractable, without status migrainosus: Secondary | ICD-10-CM

## 2018-02-28 DIAGNOSIS — G932 Benign intracranial hypertension: Principal | ICD-10-CM

## 2018-02-28 MED ORDER — NAPROXEN 500 MG TABLET
ORAL_TABLET | Freq: Two times a day (BID) | ORAL | 11 refills | 0.00000 days | Status: CP
Start: 2018-02-28 — End: 2018-10-31

## 2018-02-28 MED ORDER — ZONISAMIDE 50 MG CAPSULE
ORAL_CAPSULE | Freq: Every day | ORAL | 11 refills | 0.00000 days | Status: CP
Start: 2018-02-28 — End: 2018-05-11

## 2018-02-28 MED ORDER — ACETAZOLAMIDE ER 500 MG CAPSULE,EXTENDED RELEASE
ORAL_CAPSULE | Freq: Two times a day (BID) | ORAL | 11 refills | 0.00000 days | Status: CP
Start: 2018-02-28 — End: 2018-05-30

## 2018-03-21 ENCOUNTER — Encounter: Admit: 2018-03-21 | Discharge: 2018-03-22 | Payer: BLUE CROSS/BLUE SHIELD

## 2018-03-21 DIAGNOSIS — B20 Human immunodeficiency virus [HIV] disease: Secondary | ICD-10-CM

## 2018-03-21 MED ORDER — BICTEGRAVIR 50 MG-EMTRICITABINE 200 MG-TENOFOVIR ALAFENAM 25 MG TABLET
ORAL_TABLET | Freq: Every day | ORAL | 11 refills | 0.00000 days | Status: CP
Start: 2018-03-21 — End: 2018-03-21

## 2018-03-21 MED ORDER — BICTEGRAVIR 50 MG-EMTRICITABINE 200 MG-TENOFOVIR ALAFENAM 25 MG TABLET: 1 | tablet | 11 refills | 0 days

## 2018-03-21 MED FILL — BIKTARVY/50-200-25MG/TABS: BIKTARVY/50-200-25MG/TABS | 30 days supply | Qty: 30 | Fill #0

## 2018-03-21 NOTE — Unmapped (Signed)
I spoke to the patient at the request of her ID provider. She has been off of medication x 1 month. She was last seen in clinic in 2018. She previously had Medicaid, but now has [employer] through her job. She was having issues with her pharmacy and was asking for assistance. I discussed her case with Tammy Sours at [facility]. She was also provided a copay card by our benefits team. She has activated the copay card and I connected her with Tammy Sours to get medication delivery scheduled.    I also confirmed the patient's neuro medication. Discussed through Epic inbasket message with Dr. Christell Constant who manages the patient's neurology care. She confirmed that the patient should be taking both acetazolamide and zonisamide. They are aware of the interaction between the two of these.     Time Spent During Encounter: 30 minutes    [name], PharmD, BCPS, AAHIVP, CPP  Infectious Disease Clinical Pharmacy Practitioner   [facility]   Direct line: [phone]

## 2018-03-21 NOTE — Unmapped (Signed)
Assessment/Plan:      Morgan Hebert, a 32 y.o. female seen today for routine HIV followup.    Plan:  HIV  Concerning that she has been off ARVs. Much of discussion today centered around the need to maintain her excellent health and take medication daily regardless of what is happening in her life. Fills ART via private insurance. Due for HMAP renewal.  ?? Continue current therapy. E-prescribed today.  ?? Labs drawn through study - upcoming [facility] visit.  ?? Discussed specific strategies to improve ARV adherence.  Lab Results   Component Value Date    ACD4 1,696 (A) 05/01/2015    CD4 45 06/23/2014    HIVCP detected 10/23/2017    HIVRS 42,400 10/23/2017     Pseudotumor cerebri  Working with neurologists to settle on best regimen for her, reviewed today with pharmacist Morgan Hebert (acetazolamide and zonisamide).    Sexual health & secondary prevention  Sex with female partner (husband). Monogamous with single partner. She does disclose status. Sometimes uses condoms.    Lab Results   Component Value Date    RPR Nonreactive 03/01/2017    LABRPR NON-REACTIVE 02/13/2014    CTNAA Negative 03/01/2017    CTNAA Negative 12/02/2015    CTNAA Negative 02/11/2015    GCNAA Negative 03/01/2017    GCNAA Negative 12/02/2015    GCNAA Negative 02/11/2015    SPECTYPE Urine 03/01/2017    SPECTYPE Urine 12/02/2015    SPECTYPE Urine 02/11/2015    SPECSOURCE Urine 03/01/2017    SPECSOURCE Urine 12/02/2015     ?? GC/CT NAATs -- needed but deferred to future visit  ?? RPR -- needed but deferred to future visit    Health maintenance  Lab Results   Component Value Date    CREATININE 0.82 02/28/2018    QFTTBGOLD NEGATIVE 02/13/2014    HEPCAB Nonreactive 03/01/2017    CHOL 139 03/29/2017    HDL 40 03/29/2017    LDL 79 03/29/2017    NONHDL 99 03/29/2017    TRIG 161 03/29/2017    A1C 5.3 03/29/2017    PAP Negative for intraephithelial lesion or malignancy 03/29/2017     Communicable diseases  # TB - no longer needed; negative IGRA 2015 and low/no risk # HCV - negative 2018; rescreen w/Ab q1-2y    Cancer screening  # Anorectal - not yet done  # Colorectal - SCREEN AGE 72+  # Liver - not yet done  # Lung - not applicable    # Breast - SCREEN AGE 25+ -- Q1-2Y  # Cervical - neg cyto June 2018 through [facility].    Cardiovascular disease  # The ASCVD Risk score Denman George DC Jorge Ny al., 2013) failed to calculate.    Immunization History   Administered Date(s) Administered   ??? Influenza Vaccine Quad (IIV4 PF) 38mo+ injectable 06/30/2015, 06/15/2016     ?? Screening ordered today: none  ?? Immunizations ordered today: none    Counseling services took more than 50% of today's visit time.  Counseled as documented above regarding medication adherence.    Disposition  Return to clinic 4 weeks or sooner if needed.    Morgan Bristol, MD, MPH   [facility]   [address], 1st floor   [place], [place]. [address]   Phone: [phone]   Fax: [phone]          Subjective:      Chief Complaint   HIV followup    HPI  Return  patient visit for Morgan Hebert, a 33 y.o. female.  Has been off ART for > 6 months. First her copay increased, then she had upheaval with insurance coverage. Knew she should call to let us help her but fel that life was chaotic and she would be ok. She feels well but has life stress with kids and a visit from her birth mother.    Past Medical History:   Diagnosis Date   ??? Abnormal mammogram    ??? Constipation    ??? Diarrhea    ??? Environmental allergies    ??? HIV (human immunodeficiency virus infection) (CMS-HCC)    ??? HIV (human immunodeficiency virus infection) (CMS-HCC)    ??? IUD (intrauterine device) in place    ??? Migraine    ??? Pseudotumor cerebri        Medications and Allergies   Reviewed and updated today. See bottom of this visit's encounter summary for details.  Current Outpatient Medications on File Prior to Visit   Medication Sig   ??? acetaZOLAMIDE (DIAMOX) 500 mg capsule Take 4 capsules (2,000 mg total) by mouth Two (2) times a day.   ??? bictegrav-emtricit-tenofov ala (BIKTARVY) 50-200-25 mg tablet Take 1 tablet by mouth daily.   ??? diphenhydrAMINE (BENADRYL) 25 mg capsule Take 25 mg by mouth.   ??? naproxen (NAPROSYN) 500 MG tablet Take 1 tablet (500 mg total) by mouth 2 (two) times a day with meals.   ??? promethazine (PHENERGAN) 25 MG tablet TAKE ONE TABLET BY MOUTH EVERY 8 HOURS AS NEEDED FOR NAUSEA   ??? SUMAtriptan (IMITREX) 50 MG tablet TAKE ONE TABLET BY MOUTH AT ONSET OF HEADACHE AND IF NEEDED, REPEAT ONCE IN 2 HOURS. MAX OF 2 DOSES/DAY AND 4 DOSES/WEEK   ??? zonisamide (ZONEGRAN) 50 MG capsule Take 1 capsule (50 mg total) by mouth daily.   ??? clotrimazole (LOTRIMIN) 1 % cream Apply topically Two (2) times a day. (Patient not taking: Reported on 02/28/2018)   ??? desipramine (NOPRAMIN) 100 MG tablet TAKE 1 TABLET BY MOUTH EVERY NIGHT AT BEDTIME AS TOLERATED (Patient not taking: Reported on 02/28/2018)   ??? diclofenac sodium (VOLTAREN) 1 % gel Apply 2 g topically.   ??? dicyclomine (BENTYL) 10 mg capsule TAKE 1 CAPSULE BY MOUTH TWO TIMES A DAY. (Patient not taking: Reported on 03/21/2018)   ??? furosemide (LASIX) 20 MG tablet Take 1 tablet (20 mg total) by mouth daily. for 12 days   ??? naproxen (NAPROSYN) 500 MG tablet Take 1 tablet (500 mg total) by mouth 2 (two) times a day with meals. (Patient not taking: Reported on 03/21/2018)   ??? omeprazole (PRILOSEC) 20 MG capsule TAKE 1 CAPSULE BY MOUTH EVERY MORNING BEFORE BREAKFAST. (Patient not taking: Reported on 02/28/2018)     No current facility-administered medications on file prior to visit.        Allergies   Allergen Reactions   ??? 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       Social History  General ??? lives in [place] with her husband and her 1 daughter (born 2008). There is financial tension.   ?? Morgan Hebert and his girlfriend are there sometimes as well.   ?? Mom and sister are both positive (sister is perinatally infected, patient with presumed sexual transmission from female partner).  ?? 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  Sexual History ??? sex with men (husband only)  Substance Use ??? marijuana (smokes nightly)  Social History     Tobacco Use   ??? Smoking status: Former Smoker     Types: Cigarettes   ??? Smokeless tobacco: Never Used   Substance Use Topics   ??? Alcohol use: Yes     Alcohol/week: 0.0 oz     Comment: rare       Review of Systems  As per HPI. Remainder of 10 systems reviewed, negative.        Objective:      BP 89/62  - Pulse 97  - Temp 36.9 ??C (98.5 ??F) (Oral)  - Ht 149.9 cm (4' 11.02)  - Wt 71.9 kg (158 lb 8 oz)  - BMI 32.00 kg/m??   Wt Readings from Last 3 Encounters:   03/21/18 71.9 kg (158 lb 8 oz)   02/28/18 71.9 kg (158 lb 9.6 oz)   12/31/17 73.4 kg (161 lb 12.8 oz)        Const looks well and attentive, alert, appropriate   Eyes sclerae anicteric, noninjected OU   ENT dentition good   Lymph no cervical or supraclavicular LAD   CV RRR. No murmurs. No rub or gallop. S1/S2.   Resp CTAB ant/post, normal work of breathing   GI Soft, no organomegaly. NTND. NABS.   GU deferred   Rectal deferred   Skin no petechiae, ecchymoses or obvious rashes on clothed exam   MSK full exam deferred   Neuro CN II-XII grossly intact, MAEE, non focal   Psych Appropriate affect. Eye contact good. Linear thoughts. Fluent speech.     Laboratory Data  Reviewed in Epic today, using Synopsis and Chart Review filters.    Lab Results   Component Value Date    CREATININE 0.82 02/28/2018    QFTTBGOLD NEGATIVE 02/13/2014    HEPCAB Nonreactive 03/01/2017    CHOL 139 03/29/2017    HDL 40 03/29/2017    LDL 79 03/29/2017    NONHDL 99 03/29/2017    TRIG 161 03/29/2017    A1C 5.3 03/29/2017    PAP Negative for intraephithelial lesion or malignancy 03/29/2017                _____________________________________________________________________

## 2018-03-21 NOTE — Unmapped (Signed)
It was great to see you today.    Contacting us   During working hours  [phone]  After hours or weekends [phone] and ask for the ID doctor on call  Fax number   [phone]    MEDICATIONS  For refills, please contact your pharmacy and ask them to electronically send or fax the request to the clinic.     Please bring all medications in original bottles to every appointment.    HMAP (formerly ADAP) or [employer] Eligibility (required even if you do not receive medication through [facility])  Please remember to renew your Juanell Fairly eligibility during renewal periods which occur twice a year: January-March and July-September.     The following are needed for each renewal:   - [facility] Identification (if you don't have one, then a bill with your name and address in [place])   - proof of income (award letter, W-2, or last three check stubs)   If you are unable to come in for renewal, let us know if we can mail, fax or e-mail paperwork to you.   HMAP Contact: [phone]      Urgent Care Clinic  Monday, Tuesday, and Thursday from 8:30 - 12 noon  Please call ahead to speak with the nursing staff if you think you need to be seen urgently!    Lab info:  Your most recent CD4 T-cell counts and viral loads are below. Here are a few things to keep in mind when looking at your numbers:  ?? For most people, we're checking CD4 counts fairly infrequently (once a year or less)  ?? It's normal for your CD4 count to be different from visit to visit.   ?? We consider your viral load to be undetectable if it says <40 or if it says Not detected.  ?? Our goal is to get your virus to be undetectable and keep it undetectable. You can help by taking your medications at about the same time, every single day. If you're having trouble with taking your medications, it's important to let us know.    YOUR RECENT LAB RESULTS:  CD4 and VIRAL LOAD  Lab Results   Component Value Date    ACD4 1,696 (A) 05/01/2015    HIVRS 42,400 10/23/2017

## 2018-03-21 NOTE — Unmapped (Signed)
[facility]   Patient Onboarding/Medication Counseling    Ms.Weiss is a 32 y.o. female with  who I am counseling today on initiation of therapy.    Medication: Susanne Borders    Verified patient's date of birth / HIPAA.      Education Provided: ?? Patient felt comfortable with [facility] and refused my offer to counsel    Drug Interactions: other medications reviewed and up to date in Epic.  No drug interactions identified.    Comorbidities/Allergies: reviewed and up to date in Epic.    Verified therapy is appropriate and should continue      Delivery Information    Medication Assistance provided: Copay Assistance    Anticipated copay of $0.00 reviewed with patient. Verified delivery address in [facility]SI and reviewed medication storage requirement.    Scheduled delivery date: 03/22/18    Explained that we ship using UPS or courier and this shipment will not require a signature.      Explained the services we provide at [facility] and that each month we would call to set up refills.  Stressed importance of returning phone calls so that we could ensure they receive their medications in time each month.  Informed patient that we should be setting up refills 7-10 days prior to when they will run out of medication.  Informed patient that welcome packet will be sent.      Patient verbalized understanding of the above information as well as how to contact the pharmacy at [phone] option 4 with any questions/concerns.  The pharmacy is open Monday through Friday 8:30am-4:30pm.  A pharmacist is available 24/7 via pager to answer any clinical questions they may have.        Patient Specific Needs      ? Patient has no physical, cognitive, or cultural barriers.    ? Patient prefers to have medications discussed with  Patient     ? Patient is able to read and understand education materials at a high school level or above.    ? Patient's primary language is  Caralyn Guile [facility] Specialty Pharmacist

## 2018-03-24 ENCOUNTER — Other Ambulatory Visit: Payer: Self-pay

## 2018-03-24 ENCOUNTER — Emergency Department
Admission: EM | Admit: 2018-03-24 | Discharge: 2018-03-24 | Disposition: A | Discharge status: Home / Self Care | Payer: 59 | Attending: Emergency Medicine | Admitting: Emergency Medicine

## 2018-03-24 ENCOUNTER — Encounter: Payer: Self-pay | Admitting: Emergency Medicine

## 2018-03-24 DIAGNOSIS — F1290 Cannabis use, unspecified, uncomplicated: Secondary | ICD-10-CM

## 2018-03-24 DIAGNOSIS — B20 Human immunodeficiency virus [HIV] disease: Secondary | ICD-10-CM | POA: Insufficient documentation

## 2018-03-24 DIAGNOSIS — R112 Nausea with vomiting, unspecified: Secondary | ICD-10-CM | POA: Insufficient documentation

## 2018-03-24 DIAGNOSIS — Z8572 Personal history of non-Hodgkin lymphomas: Secondary | ICD-10-CM | POA: Insufficient documentation

## 2018-03-24 DIAGNOSIS — R638 Other symptoms and signs concerning food and fluid intake: Secondary | ICD-10-CM | POA: Diagnosis not present

## 2018-03-24 DIAGNOSIS — R197 Diarrhea, unspecified: Secondary | ICD-10-CM | POA: Insufficient documentation

## 2018-03-24 DIAGNOSIS — F1210 Cannabis abuse, uncomplicated: Secondary | ICD-10-CM | POA: Diagnosis not present

## 2018-03-24 LAB — COMPREHENSIVE METABOLIC PANEL
ALK PHOS: 59 U/L (ref 38–126)
ALT: 16 U/L (ref 14–54)
AST: 24 U/L (ref 15–41)
Albumin: 4.2 g/dL (ref 3.5–5.0)
Anion gap: 9 (ref 5–15)
BUN: 15 mg/dL (ref 6–20)
CALCIUM: 8.9 mg/dL (ref 8.9–10.3)
CO2: 17 mmol/L — ABNORMAL LOW (ref 22–32)
CREATININE: 0.77 mg/dL (ref 0.44–1.00)
Chloride: 113 mmol/L — ABNORMAL HIGH (ref 101–111)
Glucose, Bld: 78 mg/dL (ref 65–99)
Potassium: 3.9 mmol/L (ref 3.5–5.1)
Sodium: 139 mmol/L (ref 135–145)
Total Bilirubin: 0.5 mg/dL (ref 0.3–1.2)
Total Protein: 8.4 g/dL — ABNORMAL HIGH (ref 6.5–8.1)

## 2018-03-24 LAB — CBC
HCT: 36.7 % (ref 35.0–47.0)
Hemoglobin: 12.1 g/dL (ref 12.0–16.0)
MCH: 28.6 pg (ref 26.0–34.0)
MCHC: 33 g/dL (ref 32.0–36.0)
MCV: 86.8 fL (ref 80.0–100.0)
PLATELETS: 223 10*3/uL (ref 150–440)
RBC: 4.23 MIL/uL (ref 3.80–5.20)
RDW: 14.5 % (ref 11.5–14.5)
WBC: 5 10*3/uL (ref 3.6–11.0)

## 2018-03-24 LAB — URINALYSIS, COMPLETE (UACMP) WITH MICROSCOPIC
BILIRUBIN URINE: NEGATIVE
Bacteria, UA: NONE SEEN
GLUCOSE, UA: NEGATIVE mg/dL
Hgb urine dipstick: NEGATIVE
Ketones, ur: NEGATIVE mg/dL
LEUKOCYTES UA: NEGATIVE
NITRITE: NEGATIVE
PH: 7 (ref 5.0–8.0)
Protein, ur: NEGATIVE mg/dL
SPECIFIC GRAVITY, URINE: 1.014 (ref 1.005–1.030)

## 2018-03-24 LAB — LIPASE, BLOOD: LIPASE: 26 U/L (ref 11–51)

## 2018-03-24 LAB — POCT PREGNANCY, URINE: Preg Test, Ur: NEGATIVE

## 2018-03-24 MED ORDER — ONDANSETRON HCL 4 MG/2ML IJ SOLN
4.0000 mg | Freq: Once | INTRAMUSCULAR | Status: AC
  Administered 2018-03-24: 4 mg via INTRAVENOUS
  Filled 2018-03-24: qty 2

## 2018-03-24 MED ORDER — SODIUM CHLORIDE 0.9 % IV BOLUS
1000.0000 mL | Freq: Once | INTRAVENOUS | Status: AC
  Administered 2018-03-24: 1000 mL via INTRAVENOUS

## 2018-03-24 MED ORDER — ONDANSETRON 4 MG PO TBDP: 4.0000 mg | ORAL_TABLET | Freq: Three times a day (TID) | ORAL | 0 refills | Status: DC | PRN

## 2018-03-24 NOTE — ED Triage Notes (Signed)
First Nurse Note:  C/O vomiting x 24 hours

## 2018-03-24 NOTE — ED Notes (Signed)
Patient tolerated ginger ale well.  

## 2018-03-24 NOTE — ED Provider Notes (Signed)
[facility] Emergency Department Provider Note  ____________________________________________  Time seen: Approximately 10:48 AM  I have reviewed the triage vital signs and the nursing notes.   HISTORY  Chief Complaint Abdominal Pain and Emesis    HPI Amber Key is a 32 y.o. female history of HIV, idiopathic intracranial hypertension followed at [facility], daily marijuana use, presenting with nausea and vomiting.  The patient reports that for at least 4 years, she has been on Diamox which helps with her headaches but does have daily nausea and intermittent vomiting.  She is that she usually gets nauseated, has 1 or 2 episodes of vomiting, and then this resolves.  She does use marijuana for both headaches and nausea.  The patient also reports that she has daily diarrhea, describing 3 loose stools daily.  Recently, the patient reports that her headaches have improved.  Yesterday, the patient was at work when she developed nausea and had multiple episodes of vomiting throughout the day.  She also had her chronic unchanged diarrhea.  Marijuana did not help her nausea and she had loss of appetite overnight.  Day, the patient is not having any new symptoms, but she came to "get checked out."  Past Medical History:  Diagnosis Date  . Enlarged lymph nodes unk  . HIV (human immunodeficiency virus infection) ([place])    diagnosed years ago at [facility]  . Lymphoma ([place])   . Memory loss   . Migraines   . Pseudotumor cerebri unk  . Vision loss     Patient Active Problem List   Diagnosis Date Noted  . Abnormal mammogram 06/26/2014  . Adenopathy 02/11/2014  . Human immunodeficiency virus (HIV) infection ([place]) 02/10/2014  . Headache, migraine 12/13/2013  . Benign intracranial hypertension 12/13/2013    Past Surgical History:  Procedure Laterality Date  . COLONOSCOPY    . LUMBAR PUNCTURE    . UPPER GI ENDOSCOPY    . wisdom teeth      Current Outpatient Rx  . Order #:  [id number] Class: Historical Med  . Order #: [id number] Class: Historical Med  . Order #: [id number] Class: Print  . Order #: [id number] Class: Historical Med  . Order #: [id number] Class: Print  . Order #: [id number] Class: Historical Med  . Order #: [id number] Class: Print  . Order #: [id number] Class: Historical Med  . Order #: [id number] Class: Historical Med    Allergies Peanut-containing drug products  Family History  Problem Relation Age of Onset  . Ovarian cancer Mother   . Breast cancer Mother   . Ovarian cancer Maternal Aunt   . Diabetes Maternal Aunt   . Hypertension Maternal Aunt   . Ovarian cancer Maternal Grandmother   . Breast cancer Maternal Grandmother   . Colon cancer Neg Hx     Social History Social History   Tobacco Use  . Smoking status: Never Smoker  . Smokeless tobacco: Never Used  Substance Use Topics  . Alcohol use: Yes    Comment: occas  . Drug use: Yes    Frequency: 7.0 times per week    Types: Marijuana    Comment: for migraines    Review of Systems Constitutional: No fever/chills.  No lightheadedness or syncope.  No trauma.  No tick bites. Eyes: No new visual changes. ENT: No sore throat. No congestion or rhinorrhea. Cardiovascular: Denies chest pain. Denies palpitations. Respiratory: Denies shortness of breath.  No cough. Gastrointestinal: No abdominal pain.  +nausea, +vomiting.  + chronic unchanged diarrhea.  No constipation. Genitourinary: Negative for dysuria. Musculoskeletal: Negative  for back pain. Skin: Negative for rash. Neurological: + for chronic, improving, headaches. No focal numbness, tingling or weakness.  No new changes in vision, no changes in speech or mental status.  No difficulty walking.    ____________________________________________   PHYSICAL EXAM:  VITAL SIGNS: ED Triage Vitals  Enc Vitals Group     BP 03/24/18 0916 108/75     Pulse Rate 03/24/18 0916 94     Resp 03/24/18 0916 18     Temp 03/24/18 0916 97.9 F (36.6  C)     Temp Source 03/24/18 0916 Oral     SpO2 03/24/18 0916 100 %     Weight 03/24/18 0924 158 lb (71.7 kg)     Height 03/24/18 0924 4\' 11"  (1.499 m)     Head Circumference --      Peak Flow --      Pain Score 03/24/18 0924 7     Pain Loc --      Pain Edu? --      Excl. in [place]? --     Constitutional: Alert and oriented. Well appearing and nontoxic. Answers questions appropriately. Eyes: Conjunctivae are normal.  EOMI. PERRLA.  No vertical or horizontal nystagmus.  No scleral icterus. Head: Atraumatic. Nose: No congestion/rhinnorhea. Mouth/Throat: Mucous membranes are moist.  Neck: No stridor.  Supple.  No JVD.  No meningismus. Cardiovascular: Normal rate, regular rhythm. No murmurs, rubs or gallops.  Respiratory: Normal respiratory effort.  No accessory muscle use or retractions. Lungs CTAB.  No wheezes, rales or ronchi. Gastrointestinal: Soft, nontender and nondistended.  No guarding or rebound.  No peritoneal signs. Musculoskeletal: No LE edema. Neurologic:  A&Ox3.  Speech is clear.  Face and smile are symmetric.  EOMI. PERRLA.  No vertical or horizontal nystagmus.  Moves all extremities well.  Normal gait without ataxia Skin:  Skin is warm, dry and intact. No rash noted. Psychiatric: Mood and affect are normal. Speech and behavior are normal.  Normal judgement.  ____________________________________________   LABS (all labs ordered are listed, but only abnormal results are displayed)  Labs Reviewed  COMPREHENSIVE METABOLIC PANEL - Abnormal; Notable for the following components:      Result Value   Chloride 113 (*)    CO2 17 (*)    Total Protein 8.4 (*)    All other components within normal limits  URINALYSIS, COMPLETE (UACMP) WITH MICROSCOPIC - Abnormal; Notable for the following components:   Color, Urine YELLOW (*)    APPearance HAZY (*)    All other components within normal limits  LIPASE, BLOOD  CBC  POC URINE PREG, ED  POCT PREGNANCY, URINE    ____________________________________________  EKG  Not indicated ____________________________________________  RADIOLOGY  No results found.  ____________________________________________   PROCEDURES  Procedure(s) performed: None  Procedures  Critical Care performed: No ____________________________________________   INITIAL IMPRESSION / ASSESSMENT AND PLAN / ED COURSE  Pertinent labs & imaging results that were available during my care of the patient were reviewed by me and considered in my medical decision making (see chart for details).  32 y.o. female with a history of idiopathic intracranial hypertension on Diamox with daily nausea vomiting and diarrhea, headaches, presenting with more episodes of nausea and vomiting yesterday than normal.  Overall, the patient is hemodynamically stable and has no focal neurologic deficits on examination.  Today, the patient's symptoms have significantly improved.  It is possible that the patient's symptoms are from her intracranial hypertension or her Diamox use and were just slightly  worse than usual.  Other possible etiologies include a viral or foodborne GI illness.  Given no focal neurologic deficit, and improving headaches, acute worsening of her idiopathic intracranial hypertension is much less likely.  I have also talked to the patient about the possibility that her symptoms may be worsened by her daily marijuana use.  The patient is not have any abdominal symptoms; an acute trickle intra-abdominal pathology is very unlikely.  We will check the patient's electrolytes, but her CBC shows a normal white blood cell count and her urinalysis is reassuring for no UTI.  The patient is not pregnant.  It is likely that the patient will improve with symptom medic treatment, and if she is tolerating liquids and her laboratory studies are reassuring, we will plan to have her follow-up with her primary care physician and her [facility] neurologist.  Anticipate  discharge at this time.  ----------------------------------------- 12:01 PM on 03/24/2018 -----------------------------------------  Patient's work-up in the emergency department has been reassuring.  She continues to be hemodynamically stable and has not had any episodes of vomiting or diarrhea while here.  Her electrolytes are within normal limits, her blood counts are normal, her lipase is normal, her pregnancy test is negative and her urinalysis does not show UTI.  At this time, the patient is stable for discharge home.  She will be discharged with Zofran and follow-up with both her primary care physician as well as her neurologist at [facility].  Return precautions were discussed. ____________________________________________  FINAL CLINICAL IMPRESSION(S) / ED DIAGNOSES  Final diagnoses:  Nausea vomiting and diarrhea  Marijuana use         NEW MEDICATIONS STARTED DURING THIS VISIT:  New Prescriptions   ONDANSETRON (ZOFRAN ODT) 4 MG DISINTEGRATING TABLET    Take 1 tablet (4 mg total) by mouth every 8 (eight) hours as needed for nausea or vomiting.      Eula Listen, MD 03/24/18 [phone]

## 2018-03-24 NOTE — Discharge Instructions (Addendum)
Take a clear liquid diet for the next 12 hours, then advance to a bland diet as tolerated.  You may take Zofran for nausea or vomiting.  Please continue take all your other medications as prescribed.  To the emergency department if you develop severe pain, inability to keep down fluids, changes in vision speech or mental status, numbness tingling or weakness, severe headache, fever or any other symptoms concerning to you.

## 2018-03-24 NOTE — ED Triage Notes (Signed)
Abdominal pain and vomiting since yesterday. States increased Diamox one month ago pseudotumor cerebri and at first attributed symptoms to that.

## 2018-03-26 NOTE — Unmapped (Signed)
Met w/ pt today in clinic to start RW paperwork. Application is incomplete due to pts income statement listed below is needed. Provided pt w/ a [employer] copay card for [employer]. Pt tried to email me paystubs at appt.     When I checked email pts paystub did not come through. Emailed pt informing her that paystubs did not come through. Requested that she send paystubs again.     1. 2 recent paystubs    Morgan Hebert  Time duration of intervention in minutes: 5 minutes

## 2018-03-28 NOTE — Unmapped (Signed)
This is a 32 yo participant who had a follow-up visit for the [facility] [place] Study on 03/28/18.  Denies complaints today. Pt goes to [facility] for ID care.  Participant reports she is adherent to her Susanne Borders and has an upcoming appointment with her provider.  Continues to work, lives with husband and children.  Reports good energy.  Has changed her diet in an effort to improved cholesterol and loose weight and is happy with her progress.    Physical Exam: DLCO repeated, BIA, and Body Habitus performed today.    Gyn Exam: Normal external genitalia and vagina. Cervix without visible lesions. Pap not collected. Prior NILM,, PAP q 2 years per protocol, GYN specimens collected per protocol. Pregnancy test negative. Denies vaginal complaints.    Labs Collected: Chemistries, CBC w/diff, Carbon Monoxide, CD4, and VL.  ??  All labs results will be documented in Epic.

## 2018-03-29 LAB — GFR MDRD AF AMER: Lab: 88

## 2018-03-29 LAB — CARBON MONOXIDE, BLOOD: Lab: 7 — ABNORMAL HIGH

## 2018-03-29 LAB — ALB+ALP+ALT+AST+BUN+CA+CREA...
ALBUMIN: 4.1 g/dL (ref 3.5–5.5)
ALKALINE PHOSPHATASE: 58 IU/L (ref 39–117)
AST (SGOT): 17 IU/L (ref 0–40)
CALCIUM: 9.2 mg/dL (ref 8.7–10.2)
CREATININE: 0.99 mg/dL (ref 0.57–1.00)
GAMMA GLUTAMYL TRANSFERASE: 10 IU/L (ref 0–60)
GFR MDRD AF AMER: 88 mL/min/{1.73_m2}
GFR MDRD NON AF AMER: 76 mL/min/{1.73_m2}
GLUCOSE: 82 mg/dL (ref 65–99)
PHOSPHORUS, SERUM: 3.9 mg/dL (ref 2.5–4.5)

## 2018-03-29 LAB — T-LYMPHOCYTE HELPER/SUPPRESSOR
% CD 3 POS. LYMPH.: 85.2 % (ref 57.5–86.2)
ABSOLUTE CD 3: 1704 /uL (ref 622–2402)
ABSOLUTE CD8 CNT: 704 /uL (ref 109–897)
BANDED NEUTROPHILS ABSOLUTE COUNT: 0 10*3/uL (ref 0.0–0.1)
BASOPHILS ABSOLUTE COUNT: 0 10*3/uL (ref 0.0–0.2)
BASOPHILS RELATIVE PERCENT: 1 %
CD4 % HELPER T CELL: 47.6 % (ref 30.8–58.5)
CD4 T CELL ABSOLUTE: 952 /uL (ref 359–1519)
CD4:CD8 RATIO: 1.35 (ref 0.92–3.72)
CD8 % SUPPRESSOR T CELL: 35.2 % (ref 12.0–35.5)
EOSINOPHILS ABSOLUTE COUNT: 0.1 10*3/uL (ref 0.0–0.4)
EOSINOPHILS RELATIVE PERCENT: 1 %
HEMATOCRIT: 35.5 % (ref 34.0–46.6)
IMMATURE GRANULOCYTES: 1 %
LYMPHOCYTES ABSOLUTE COUNT: 2 10*3/uL (ref 0.7–3.1)
LYMPHOCYTES RELATIVE PERCENT: 37 %
MEAN CORPUSCULAR HEMOGLOBIN CONC: 33.8 g/dL (ref 31.5–35.7)
MEAN CORPUSCULAR HEMOGLOBIN: 28.7 pg (ref 26.6–33.0)
MEAN CORPUSCULAR VOLUME: 85 fL (ref 79–97)
MONOCYTES RELATIVE PERCENT: 9 %
NEUTROPHILS ABSOLUTE COUNT: 2.8 10*3/uL (ref 1.4–7.0)
NEUTROPHILS RELATIVE PERCENT: 51 %
PLATELET COUNT: 255 10*3/uL (ref 150–450)
RED BLOOD CELL COUNT: 4.18 x10E6/uL (ref 3.77–5.28)
WHITE BLOOD CELL COUNT: 5.4 10*3/uL (ref 3.4–10.8)

## 2018-03-29 LAB — HIV-1, VIRAL LOAD DETERMINED BY PCR (87536): HIV-1 RNA BY PCR: 270 {copies}/mL

## 2018-03-29 LAB — BANDED NEUTROPHILS ABSOLUTE COUNT: Lab: 0

## 2018-03-29 LAB — LOG10 HIV-1 RNA: Lab: 2.431

## 2018-03-30 NOTE — Unmapped (Signed)
WIHS Study labs received and reviewed, WNL. Results available in Epic.  HIV VL 270, prior 42,400 in January 2019.

## 2018-04-10 NOTE — Unmapped (Signed)
[facility] Refill and Clinical Coordination Note: HIV     HIV Medication(s): Biktarvy  Additional Medication(s): none     Morgan Hebert, DOB: 1986-03-26  Phone: [phone] (home) , Alternate phone contact: N/A  Shipping address: [address]  [place] [address]  Phone or address changes today?: No  All above HIPAA information verified.  Insurance changes? No    Completed refill and clinical call assessment today to schedule patient's medication shipment from the [facility] [phone]).      MEDICATION RECONCILIATION    Confirmed the medication and dosage are correct and have not changed: Yes, regimen is correct and unchanged.    Were there any changes to your medication(s) in the past month:  No, there are no changes reported at this time.    HIV-related labs:  Lab Results   Component Value Date/Time    HIVRS 42,400 10/23/2017    HIVRS Detected (A) 05/03/2017 02:29 PM    HIVRS not detected 03/29/2017    HIVRS Not Detected 03/01/2017 12:32 PM    HIVRS detected 10/25/2016    HIVCP detected 10/23/2017    HIVCP <40 (H) 05/03/2017 02:29 PM    HIVCP <20 03/29/2017    HIVCP <20 10/25/2016    HIV10  05/03/2017 02:29 PM      Comment:      <1.6 log    HIV10  12/02/2015 12:28 PM      Comment:      <1.6 log    HIV10  02/11/2015 03:46 PM      Comment:      <1.6 log    HIV10 <1.60 03/25/2014 03:23 PM    HIV10 4.07 02/13/2014 12:32 PM    HIVCM  05/03/2017 02:29 PM      Comment:      HIV-1 quantification by real-time RT-PCR is performed using the Abbott RealTime  HIV-1 test. This test is FDA approved and can quantitate HIV-1 RNA over the  range of 40 - 1,000,000 copies/mL (1.60 log(10) -6.00 log(10) copies/mL). The reference range for this assay is Not Detected.    HIVCM  03/01/2017 12:32 PM      Comment:      HIV-1 quantification by real-time RT-PCR is performed using the Abbott RealTime  HIV-1 test. This test is FDA approved and can quantitate HIV-1 RNA over the  range of 40 - 1,000,000 copies/mL (1.60 log(10) -6.00 log(10) copies/mL). The reference range for this assay is Not Detected.    HIVCM  12/02/2015 12:28 PM      Comment:      HIV-1 quantification by real-time RT-PCR is performed using the Abbott RealTime  HIV-1 test. This test is FDA approved and can quantitate HIV-1 RNA over the  range of 40 - 1,000,000 copies/mL (1.60 log(10) -6.00 log(10) copies/mL). The reference range for this assay is Not Detected.    HIVCM : 06/23/2014 12:26 PM    HIVCM : 03/25/2014 03:23 PM    HIVCM : 02/13/2014 12:32 PM    RCD4 54.4 05/01/2015    RCD4 53.7 10/24/2014    ACD4 1,696 (A) 05/01/2015    ACD4 967 10/24/2014    ACD4 854 06/23/2014 12:26 PM         ADHERENCE      Number of tablets of HIV Medication(s) at home: 9    Did you miss any doses in the past 4 weeks? No missed doses reported.  Adherence counseling provided? Not needed  SIDE EFFECT MANAGEMENT    Are you tolerating your medication?:  Aireal reports side effects of nausea and vomiting.  She reports that it is calming down now. She went [place] the emergency [place] in [place] to receive IV fluids over the past month.  They gave her Zofran to take as well.  She reports  that her need for Zofran has diminished and is doing much better..  Side effect management discussed: I advised that she take her Biktarvy in the evening with food.  She stated that she has been taking it at night with food but would wake up and vomit.  She states she feels much  better now..      Therapy is appropriate and should be continued.    The patient will receive an FSI print out for each medication shipped and additional FDA Medication Guides as required.  Patient education from [place] or Robet Leu may also be included in the shipment.    Evidence of clinical benefit: I cannot assess clinical benefit yet.  She needs to have new lab tests to determine if the Susanne Borders is successful at lowering her HIV viral levels.      FINANCIAL/SHIPPING    Delivery Scheduled: Yes, Expected medication delivery date: 04/17/18   Additional medications refilled: No additional medications/refills needed at this time.    Ricquel did not have any additional questions at this time.      Delivery address validated in FSI scheduling system: Yes, address listed above is correct.      We will follow up with patient monthly for standard refill processing and delivery.      Thank you,  Roderic Palau   [facility] Specialty Pharmacist

## 2018-04-16 MED FILL — BIKTARVY/50-200-25MG/TABS: BIKTARVY/50-200-25MG/TABS | 30 days supply | Qty: 30 | Fill #1

## 2018-04-18 ENCOUNTER — Encounter: Admit: 2018-04-18 | Discharge: 2018-04-19 | Payer: BLUE CROSS/BLUE SHIELD

## 2018-04-18 DIAGNOSIS — B20 Human immunodeficiency virus [HIV] disease: Principal | ICD-10-CM

## 2018-04-18 NOTE — Unmapped (Signed)
It was great to see you today.    Contacting us   During working hours  [phone]  After hours or weekends [phone] and ask for the ID doctor on call  Fax number   [phone]    MEDICATIONS  For refills, please contact your pharmacy and ask them to electronically send or fax the request to the clinic.     Please bring all medications in original bottles to every appointment.    HMAP (formerly [employer]AP) or [employer] Eligibility (required even if you do not receive medication through [facility])  Please remember to renew your Juanell Fairly eligibility during renewal periods which occur twice a year: January-March and July-September.     The following are needed for each renewal:   - [facility] Identification (if you don't have one, then a bill with your name and address in [place])   - proof of income (award letter, W-2, or last three check stubs)   If you are unable to come in for renewal, let us know if we can mail, fax or e-mail paperwork to you.   HMAP Contact: [phone]      Urgent Care Clinic  Monday, Tuesday, and Thursday from 8:30 - 12 noon  Please call ahead to speak with the nursing staff if you think you need to be seen urgently!    Lab info:  Your most recent CD4 T-cell counts and viral loads are below. Here are a few things to keep in mind when looking at your numbers:  ?? For most people, we're checking CD4 counts fairly infrequently (once a year or less)  ?? It's normal for your CD4 count to be different from visit to visit.   ?? We consider your viral load to be undetectable if it says <40 or if it says Not detected.  ?? Our goal is to get your virus to be undetectable and keep it undetectable. You can help by taking your medications at about the same time, every single day. If you're having trouble with taking your medications, it's important to let us know.    YOUR RECENT LAB RESULTS:  CD4 and VIRAL LOAD  Lab Results   Component Value Date    ACD4 1,696 (A) 05/01/2015    HIVRS 42,400 10/23/2017

## 2018-04-18 NOTE — Unmapped (Signed)
Addended by: Ashok Pall I on: 04/18/2018 04:12 PM     Modules accepted: Orders

## 2018-04-18 NOTE — Unmapped (Signed)
Assessment/Plan:      Morgan Hebert, a 32 y.o. female seen today for routine HIV followup.    Plan:  HIV  Reinforced need to take medication daily regardless of what is happening in her life. Fills ART via private insurance. Due for HMAP renewal.  ?? Continue current therapy. E-prescribed at recent visit, no refills needed.   ?? VL recheck with genotype in case she is not <assay. - upcoming [facility] visit.  ?? Discussed specific strategies to improve ARV adherence.  Lab Results   Component Value Date    ACD4 1,696 (A) 05/01/2015    CD4 45 06/23/2014    HIVCP detected 10/23/2017    HIVRS 42,400 10/23/2017   Repeat VL via [facility] 03/28/18 - 270 copies/mL  Discussed decline in CD4 count over past year from 1300-->900 copies with associated decrease in % and what this means in terms of reflecting immunologic damage from being off ART.    Pseudotumor cerebri  Working with neurologists to settle on best regimen for her    Sexual health & secondary prevention  Sex with female partner (husband). Monogamous with single partner. She does disclose status. Sometimes uses condoms.    Lab Results   Component Value Date    RPR Nonreactive 03/01/2017    LABRPR NON-REACTIVE 02/13/2014    CTNAA Negative 03/01/2017    CTNAA Negative 12/02/2015    CTNAA Negative 02/11/2015    GCNAA Negative 03/01/2017    GCNAA Negative 12/02/2015    GCNAA Negative 02/11/2015    SPECTYPE Urine 03/01/2017    SPECTYPE Urine 12/02/2015    SPECTYPE Urine 02/11/2015    SPECSOURCE Urine 03/01/2017    SPECSOURCE Urine 12/02/2015     ?? GC/CT NAATs -- needed but deferred to future visit (declined today)  ?? RPR -- needed but deferred to future visit  (declined today)    Health maintenance  Lab Results   Component Value Date    CREATININE 0.99 03/28/2018    QFTTBGOLD NEGATIVE 02/13/2014    HEPCAB Nonreactive 03/01/2017    CHOL 139 03/29/2017    HDL 40 03/29/2017    LDL 79 03/29/2017    NONHDL 99 03/29/2017    TRIG 161 03/29/2017    A1C 5.3 03/29/2017    PAP Negative for intraephithelial lesion or malignancy 03/29/2017     Communicable diseases  # TB - no longer needed; negative IGRA 2015 and low/no risk  # HCV - negative 2018; rescreen w/Ab q1-2y    Cancer screening  # Anorectal - not yet done  # Colorectal - SCREEN AGE 54+  # Liver - not yet done  # Lung - not applicable    # Breast - SCREEN AGE 40+ -- Q1-2Y  # Cervical - neg cyto June 2018 through [facility]. Due for repeat June 2020.    Cardiovascular disease  # The ASCVD Risk score Denman George DC Jorge Ny al., 2013) failed to calculate.    Immunization History   Administered Date(s) Administered   ??? Influenza Vaccine Quad (IIV4 PF) 34mo+ injectable 06/30/2015, 06/15/2016     ?? Screening ordered today: none  ?? Immunizations ordered today: none    Counseling services took more than 50% of today's visit time.  Counseled as documented above regarding medication adherence.    Disposition  Return to clinic 4 weeks or sooner if needed.    [name], MD, MPH   [facility]   [address], 1st floor   [place], [place].  [address]   Phone: [phone]   Fax: [phone]        Subjective:      Chief Complaint   HIV followup    HPI  Return patient visit for Morgan Hebert, a 32 y.o. female. Return visit after resuming ART around 5-6 weeks ago after being off for >6 months. Had VL recheck via [facility] 1 month ago and was 270 copies. Has less nausea now that she is back on Biktarvy for >1 month.     Past Medical History:   Diagnosis Date   ??? Abnormal mammogram    ??? Constipation    ??? Diarrhea    ??? Environmental allergies    ??? HIV (human immunodeficiency virus infection) (CMS-HCC)    ??? HIV (human immunodeficiency virus infection) (CMS-HCC)    ??? IUD (intrauterine device) in place    ??? Migraine    ??? Pseudotumor cerebri      Medications and Allergies   Reviewed and updated today. See bottom of this visit's encounter summary for details.  Current Outpatient Medications on File Prior to Visit   Medication Sig   ??? acetaZOLAMIDE (DIAMOX) 500 mg capsule Take 4 capsules (2,000 mg total) by mouth Two (2) times a day.   ??? bictegrav-emtricit-tenofov ala (BIKTARVY) 50-200-25 mg tablet Take 1 tablet by mouth daily.   ??? dicyclomine (BENTYL) 10 mg capsule TAKE 1 CAPSULE BY MOUTH TWO TIMES A DAY. (Patient not taking: Reported on 03/21/2018)   ??? diphenhydrAMINE (BENADRYL) 25 mg capsule Take 25 mg by mouth.   ??? furosemide (LASIX) 20 MG tablet Take 1 tablet (20 mg total) by mouth daily. for 12 days   ??? naproxen (NAPROSYN) 500 MG tablet Take 1 tablet (500 mg total) by mouth 2 (two) times a day with meals.   ??? promethazine (PHENERGAN) 25 MG tablet TAKE ONE TABLET BY MOUTH EVERY 8 HOURS AS NEEDED FOR NAUSEA   ??? SUMAtriptan (IMITREX) 50 MG tablet TAKE ONE TABLET BY MOUTH AT ONSET OF HEADACHE AND IF NEEDED, REPEAT ONCE IN 2 HOURS. MAX OF 2 DOSES/DAY AND 4 DOSES/WEEK   ??? zonisamide (ZONEGRAN) 50 MG capsule Take 1 capsule (50 mg total) by mouth daily.     No current facility-administered medications on file prior to visit.        Allergies   Allergen Reactions   ??? 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     Social History  General ??? lives in [place] with her husband and her 1 daughter (born 2008). There is financial tension.   ?? Doreene Adas and his girlfriend are there sometimes as well.   ?? Mom and sister are both positive (sister is perinatally infected, patient with presumed sexual transmission from female partner).  ?? 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  Sexual History ??? sex with men (husband only)  Substance Use ??? marijuana (smokes nightly)  Social History     Tobacco Use   ??? Smoking status: Former Smoker     Types: Cigarettes   ??? Smokeless tobacco: Never Used   Substance Use Topics   ??? Alcohol use: Yes     Alcohol/week: 0.0 oz     Comment: rare       Review of Systems  As per HPI. Remainder of 10 systems reviewed, negative.        Objective:      There were no vitals taken  for this visit.  Wt Readings from Last 3 Encounters:   03/21/18 71.9 kg (158 lb 8 oz)   02/28/18 71.9 kg (158 lb 9.6 oz)   12/31/17 73.4 kg (161 lb 12.8 oz)        Const looks well and attentive, alert, appropriate   Eyes sclerae anicteric, noninjected OU   ENT dentition good   Lymph no cervical or supraclavicular LAD   CV RRR. No murmurs. No rub or gallop. S1/S2.   Resp CTAB ant/post, normal work of breathing   GI Soft, no organomegaly. NTND. NABS.   GU deferred   Rectal deferred   Skin no petechiae, ecchymoses or obvious rashes on clothed exam   MSK full exam deferred   Neuro CN II-XII grossly intact, MAEE, non focal   Psych Appropriate affect. Eye contact good. Linear thoughts. Fluent speech.     Laboratory Data  Reviewed in Epic today, using Synopsis and Chart Review filters.    Lab Results   Component Value Date    CREATININE 0.99 03/28/2018    QFTTBGOLD NEGATIVE 02/13/2014    HEPCAB Nonreactive 03/01/2017    CHOL 139 03/29/2017    HDL 40 03/29/2017    LDL 79 03/29/2017    NONHDL 99 03/29/2017    TRIG 478 03/29/2017    A1C 5.3 03/29/2017    PAP Negative for intraephithelial lesion or malignancy 03/29/2017                _____________________________________________________________________

## 2018-04-20 LAB — HIV RNA QNT RSLT: HIV 1 RNA:PrThr:Pt:Ser/Plas:Ord:Probe.amp.tar: NOT DETECTED

## 2018-04-20 LAB — HIV RNA, QUANTITATIVE, PCR

## 2018-04-23 NOTE — Unmapped (Signed)
Called pt to let her know that the pay stubs she emailed me on 04/18/18 did not go through for her RW application. When she was in the clinic last week, pt got on [employer] and emailed me the pay stubs. I verified my emailed address. However, when I checked my inbasket, there was nothing there. I left pt a voicemail letting her know this. Asked pt to see if maybe she could email them directly from her email address instead from the [employer] website.     Sherene Sires    Time Duration of intervention in minutes: 5 mins

## 2018-05-02 ENCOUNTER — Ambulatory Visit: Admit: 2018-05-02 | Discharge: 2018-05-03 | Payer: BLUE CROSS/BLUE SHIELD

## 2018-05-02 DIAGNOSIS — H4710 Unspecified papilledema: Secondary | ICD-10-CM

## 2018-05-02 DIAGNOSIS — G43909 Migraine, unspecified, not intractable, without status migrainosus: Secondary | ICD-10-CM

## 2018-05-02 DIAGNOSIS — R51 Headache: Secondary | ICD-10-CM

## 2018-05-02 DIAGNOSIS — G932 Benign intracranial hypertension: Principal | ICD-10-CM

## 2018-05-02 NOTE — Unmapped (Addendum)
You were seen in neurology clinic today for management of your multifactorial headache; we think several causes are contributing to these headaches, including pseudotumor cerebri, migraines headache, medication overuse, and potentially a component of occipital neuralgia. In discussion with you, we have recommended no changes to your medication but advised a 2-3 week trial of cold compresses (you can use ice packs or frozen peas, as suggested during clinic) to your occipital grooves bilaterally for 15 minutes twice daily. If this fails to produce an improvement over that period of time, please call the clinic to see about being rescheduled for further assessment and a trial of occipital nerve block. Otherwise, we will re-refer you to [facility] for an assessment of your retinas and plan to follow-up with you in 1-3 months in [facility].     Regards,  [facility]  Patient Education        Headache: Care Instructions  Your Care Instructions    Headaches have many possible causes. Most headaches aren't a sign of a more serious problem, and they will get better on their own. Home treatment may help you feel better faster.  The doctor has checked you carefully, but problems can develop later. If you notice any problems or new symptoms, get medical treatment right away.  Follow-up care is a key part of your treatment and safety. Be sure to make and go to all appointments, and call your doctor if you are having problems. It's also a good idea to know your test results and keep a list of the medicines you take.  How can you care for yourself at home?  ?? Do not drive if you have taken a prescription pain medicine.  ?? Rest in a quiet, dark room until your headache is gone. Close your eyes and try to relax or go to sleep. Don't watch TV or read.  ?? Put a cold, moist cloth or cold pack on the painful area for 10 to 20 minutes at a time. Put a thin cloth between the cold pack and your skin.  ?? Use a warm, moist towel or a heating pad set on low to relax tight shoulder and neck muscles.  ?? Have someone gently massage your neck and shoulders.  ?? Take pain medicines exactly as directed.  ? If the doctor gave you a prescription medicine for pain, take it as prescribed.  ? If you are not taking a prescription pain medicine, ask your doctor if you can take an over-the-counter medicine.  ?? Be careful not to take pain medicine more often than the instructions allow, because you may get worse or more frequent headaches when the medicine wears off.  ?? Do not ignore new symptoms that occur with a headache, such as a fever, weakness or numbness, vision changes, or confusion. These may be signs of a more serious problem.  To prevent headaches  ?? Keep a headache diary so you can figure out what triggers your headaches. Avoiding triggers may help you prevent headaches. Record when each headache began, how long it lasted, and what the pain was like (throbbing, aching, stabbing, or dull). Write down any other symptoms you had with the headache, such as nausea, flashing lights or dark spots, or sensitivity to bright light or loud noise. Note if the headache occurred near your period. List anything that might have triggered the headache, such as certain foods (chocolate, cheese, wine) or odors, smoke, bright light, stress, or lack of sleep.  ?? Find healthy  ways to deal with stress. Headaches are most common during or right after stressful times. Take time to relax before and after you do something that has caused a headache in the past.  ?? Try to keep your muscles relaxed by keeping good posture. Check your jaw, face, neck, and shoulder muscles for tension, and try relaxing them. When sitting at a desk, change positions often, and stretch for 30 seconds each hour.  ?? Get plenty of sleep and exercise.  ?? Eat regularly and well. Long periods without food can trigger a headache.  ?? Treat yourself to a massage. Some people find that regular massages are very helpful in relieving tension.  ?? Limit caffeine by not drinking too much coffee, tea, or soda. But don't quit caffeine suddenly, because that can also give you headaches.  ?? Reduce eyestrain from computers by blinking frequently and looking away from the computer screen every so often. Make sure you have proper eyewear and that your monitor is set up properly, about an arm's length away.  ?? Seek help if you have depression or anxiety. Your headaches may be linked to these conditions. Treatment can both prevent headaches and help with symptoms of anxiety or depression.  When should you call for help?  Call 911 anytime you think you may need emergency care. For example, call if:  ?? ?? You have signs of a stroke. These may include:  ? Sudden numbness, paralysis, or weakness in your face, arm, or leg, especially on only one side of your body.  ? Sudden vision changes.  ? Sudden trouble speaking.  ? Sudden confusion or trouble understanding simple statements.  ? Sudden problems with walking or balance.  ? A sudden, severe headache that is different from past headaches.   ??Call your doctor now or seek immediate medical care if:  ?? ?? You have a new or worse headache.   ?? ?? Your headache gets much worse.   Where can you learn more?  Go to [facility] at [email or web address]  Select Preferences in the upper right hand corner, then select Health Library under Resources. Enter M271 in the search box to learn more about Headache: Care Instructions.  Current as of: January 04, 2018  Content Version: 12.1  ?? 2006-2019 [employer]. Care instructions adapted under license by [facility]. If you have questions about a medical condition or this instruction, always ask your healthcare professional. [employer] disclaims any warranty or liability for your use of this information.         Patient Education        Migraine Headache: Care Instructions  Your Care Instructions  Migraines are painful, throbbing headaches that often start on one side of the head. They may cause nausea and vomiting and make you sensitive to light, sound, or smell.  Without treatment, migraines can last from 4 hours to a few days. Medicines can help prevent migraines or stop them after they have started. Your doctor can help you find which ones work best for you.  Follow-up care is a key part of your treatment and safety. Be sure to make and go to all appointments, and call your doctor if you are having problems. It's also a good idea to know your test results and keep a list of the medicines you take.  How can you care for yourself at home?  ?? Do not drive if you have taken a prescription pain medicine.  ?? Rest in a quiet, dark room  until your headache is gone. Close your eyes, and try to relax or go to sleep. Don't watch TV or read.  ?? Put a cold, moist cloth or cold pack on the painful area for 10 to 20 minutes at a time. Put a thin cloth between the cold pack and your skin.  ?? Use a warm, moist towel or a heating pad set on low to relax tight shoulder and neck muscles.  ?? Have someone gently massage your neck and shoulders.  ?? Take your medicines exactly as prescribed. Call your doctor if you think you are having a problem with your medicine. You will get more details on the specific medicines your doctor prescribes.  ?? Be careful not to take pain medicine more often than the instructions allow. You could get worse or more frequent headaches when the medicine wears off.  To prevent migraines  ?? Keep a headache diary so you can figure out what triggers your headaches. Avoiding triggers may help you prevent headaches. Record when each headache began, how long it lasted, and what the pain was like. (Was it throbbing, aching, stabbing, or dull?) Write down any other symptoms you had with the headache, such as nausea, flashing lights or dark spots, or sensitivity to bright light or loud noise. Note if the headache occurred near your period. List anything that might have triggered the headache. Triggers may include certain foods (chocolate, cheese, wine) or odors, smoke, bright light, stress, or lack of sleep.  ?? If your doctor has prescribed medicine for your migraines, take it as directed. You may have medicine that you take only when you get a migraine and medicine that you take all the time to help prevent migraines.  ? If your doctor has prescribed medicine for when you get a headache, take it at the first sign of a migraine, unless your doctor has given you other instructions.  ? If your doctor has prescribed medicine to prevent migraines, take it exactly as prescribed. Call your doctor if you think you are having a problem with your medicine.  ?? Find healthy ways to deal with stress. Migraines are most common during or right after stressful times. Take time to relax before and after you do something that has caused a migraine in the past.  ?? Try to keep your muscles relaxed by keeping good posture. Check your jaw, face, neck, and shoulder muscles for tension. Try to relax them. When you sit at a desk, change positions often. And make sure to stretch for 30 seconds each hour.  ?? Get plenty of sleep and exercise.  ?? Eat meals on a regular schedule. Avoid foods and drinks that often trigger migraines. These include chocolate, alcohol (especially red wine and port), aspartame, monosodium glutamate (MSG), and some additives found in foods (such as hot dogs, bacon, cold cuts, aged cheeses, and pickled foods).  ?? Limit caffeine. Don't drink too much coffee, tea, or soda. But don't quit caffeine suddenly. That can also give you migraines.  ?? Do not smoke or allow others to smoke around you. If you need help quitting, talk to your doctor about stop-smoking programs and medicines. These can increase your chances of quitting for good.  ?? If you are taking birth control pills or hormone therapy, talk to your doctor about whether they are triggering your migraines.  When should you call for help?  Call 911 anytime you think you may need emergency care. For example, call if:  ?? ?? You  have signs of a stroke. These may include:  ? Sudden numbness, paralysis, or weakness in your face, arm, or leg, especially on only one side of your body.  ? Sudden vision changes.  ? Sudden trouble speaking.  ? Sudden confusion or trouble understanding simple statements.  ? Sudden problems with walking or balance.  ? A sudden, severe headache that is different from past headaches.   ??Call your doctor now or seek immediate medical care if:  ?? ?? You have new or worse nausea and vomiting.   ?? ?? You have a new or higher fever.   ?? ?? Your headache gets much worse.   ??Watch closely for changes in your health, and be sure to contact your doctor if:  ?? ?? You are not getting better after 2 days (48 hours).   Where can you learn more?  Go to [facility] at [email or web address]  Select Preferences in the upper right hand corner, then select Health Library under Resources. Enter [phone] in the search box to learn more about Migraine Headache: Care Instructions.  Current as of: January 04, 2018  Content Version: 12.1  ?? 2006-2019 [employer]. Care instructions adapted under license by [facility]. If you have questions about a medical condition or this instruction, always ask your healthcare professional. [employer] disclaims any warranty or liability for your use of this information.

## 2018-05-02 NOTE — Unmapped (Signed)
Neurology Follow-up Visit Note     [facility] [address]  [facility] NEUROLOG[facility]INIC [address] [place]  [address]  [place] [address]  [phone]    Date: 05/02/2018   Patient Name: Morgan Hebert   MRN: [id number]   PCP: No PCP Per Patient  Referring Provider: Pcp, None Per Patient       Assessment and Plan          Morgan Hebert is a 32 y.o. female with a past medical history of pseudotumor cerebri, multifactorial headaches, chronic GI complaints (possibly of functional nature), and HIV (on Biktarvy, VL 240 in June 2019) presenting for follow-up of her multifactorial headaches and pseudotumor cerebri.     Multifactorial headaches, including pseudotumor cerebri: Patient reports no improvement, and possibly some worsening, in frequency and intensity of her chronic daily headache since last in neurology clinic in May 2019, at which point she was initiated on zonisamide 50mg  qd and uptitrated to Diamox 2g BID. She describes daily headaches, worsened by heat, light, and certain smells that originate at the back of her neck and radiate over the top of her head bilaterally to her eyes, at which point she may experience associated ringing in her ears, nausea & vomiting, double vision, photophobia, and phonophobia. She feels these headache range from 5-9/10 in intensity from day to day, and are characterized by a continuous, sharp pain that she finds debilitating on approximately half of all days. She has declined to use abortive medications like Imitrex and promethazine due to their sedating effects, and she prefers alternating between ibuprofen and naproxen, which both give her partial relief. 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 Her recent weight loss is likely ameliorating the first component thereof. 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    Plan:  - 2-3 week trial of cold compresses to bilateral occipital grooves for BID.   - If no improvement, to reschedule appointment with [facility] for trial of occipital nerve block.   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      Reviewed the differential diagnosis, plan of care in detail, as well as other elements as detailed above.  A written summary was provided to the patient, including a current medication and allergy list, problem list and plan of care. In addition, my contact information was provided to the patient, in writing.  All the patient's questions and concerns were addressed to their stated satisfaction .      Return Visit Discussed: No follow-ups on file.    Patient Instructions: You were seen in neurology clinic today for management of your multifactorial headache; we think several causes are contributing to these headaches, including pseudotumor cerebri, migraines headache, medication overuse, and potentially a component of occipital neuralgia. In discussion with you, we have recommended no changes to your medication but advised a 2-3 week trial of cold compresses (you can use ice packs or frozen peas, as suggested during clinic) to your occipital grooves bilaterally for 15 minutes twice daily.  If this fails to produce an improvement over that period of time, please call the clinic to see about being rescheduled for further assessment and a trial of occipital nerve block. Otherwise, we will re-refer you to [facility] for an assessment of your retinas and plan to follow-up with you in 1-3 months in [facility].    Greater than 50% of today's visit lasting greater than 25 minutes (15176) was dedicated to face-to-face counseling on the points above.    This patient was seen and discussed with Dr. Raenette Rover who agrees with the above assessment and plan.       Mindi Curling, MBBS  PGY-2 Neurology    *Patient note was created using Dragon Dictation.  Any errors in syntax or even information may not have been identified and edited on initial review prior to signing this note.       HPI         HPI: Morgan Hebert is a 32 y.o. female with a past medical history of pseudotumor cerebri, multifactorial headaches, chronic GI complaints (possibly of functional nature), and HIV (on Biktarvy, VL 240 in June 2019) presenting for follow-up of her multifactorial headaches and pseudotumor cerebri. . Patient was last seen by [facility] on 02/28/2018.      To review her relevant headache history, she initially presented to [facility] ED in 11/2013 with 4 months of headache which were described as initially intermittent with increase in frequency and later becoming continuous. Headaches were right-sided and at times associated with photophobia, phonophobia, nausea with occasional emesis, vertigo, and right ear tinnitus. She also complained of bilateral blurry vision and double vision. She had not taken oral OCPs, vitamin A, or tetracyclines. Fundoscopic examination demonstrated mild disc edema bilaterally. She underwent MRI brain which showed no abnormalities and lumbar puncture which showed normal CSF and elevated opening pressure of 42 cm H2O.  She was started on acetazolamide 500 mg twice daily for pseudotumor cerebri; later this was increased to 1000 mg bid for improved symptoms control and improved papilledema. She later became asymptomatic allowing for the discontinuation of acetazolamide 1000 mg twice daily. In 01/2014 due to continued migraines, she was started on prophylaxis with propranolol, which has subsequently been discontinued due to hypotension.  In 2016, she endorsed a 30 pound weight gain accompanied by increased frequency of headache.  Her headache characteristics had changed at that time with a near continuous global headache upon which was superimposed periods of daily severe pressure behind her eyes and in her neck and shoulders.  At that time she was using promethazine and naproxen for daily abortive treatment with success.  In early 2017, she was restarted on Diamox up to 1500 mg twice daily but was receiving infrequent ophthalmologic care. Fortunately she was seen on 12/01/2015 and found to have bilateral retinal thinning temporally as well as decreased visual fields on testing in the OS nasal field. Repeat exam in April 2017 demonstrated some improvement of her OS visual fields.  ??  Since that time, patient has intermittently been seen in the emergency room for headache and has been tried on several medications prescribed by other providers.  This has included desipramine (caused significant sedation), indomethacin (caused significant abdominal pain), and amitriptyline (cause sedation).  Of note, the TCAs were started in the setting of concurrent Phenergan and Bentyl use which may have worsened the sedating side effects.  She is also been evaluated with CPAP in 2017 which did not demonstrate any OSA.  At that  time her husband reported snoring without periods of apnea.  This is been stable per him.  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  However in the past, her ophthalmology exams have been stable to improving.  We have discussed several times that surgical intervention may not alter her headache syndrome.  Rather this is designed to preserve her vision.  ??  In late March 2019, the patient reports that she was seen by a local optometrist who reported to her that she had high pressure in her eyes.  At that time she had been on Diamox 1000 mg twice daily.  Over the phone, high increase this to 1500 mg twice daily and recommend that she get an LP in the emergency department.  Unfortunately, the LP was unsuccessful and caused back pain.  She reports that since earlier this year, she has had nearly daily headache that is described as starting in the posterior region and becoming global.  There is then an intense pressure behind her eyes.  She reports some mild photophobia and phonophobia as well as intermittent nausea and vomiting.  She also endorses some mild to moderate tinnitus.  Since her last evaluation, she has had significant changes in her medication regimen.  She is no longer taking her GI cocktails.  She is not taking any migraine prophylaxis.  She takes ibuprofen 800 mg 3-4 times daily with mild to moderate headache relief.  She is not followed regularly by an ophthalmologist.  She does report losing a few pounds in the last few weeks as she is watching her diet more carefully.  Her current medication regimen includes her Diamox 1500 mg twice daily, her HIV antiviral, ibuprofen as above, and Benadryl 25 mg nightly for allergies.  She takes this year-round.  She denies any change in her vision but reports that the optometrist gave her a stronger prescription.  She continues to endorse good follow-up with her infectious disease provider.  She is tolerating the Diamox well with mild tingling paresthesias which she has gotten used to.    Patient reports no improvement, and possibly some worsening, in frequency and intensity of her chronic daily headache since last in neurology clinic in May 2019, at which point she was initiated on zonisamide 50mg  qd and uptitrated to Diamox 2g BID. She was also referred to [facility] ophthalmology for assessment of her visual fields and retinal testing, but was never contacted by the clinic. She describes daily headaches, worsened by heat, light, and certain smells that originate at the back of her neck and radiate over the top of her head bilaterally to her eyes, at which point she may experience associated ringing in her ears, nausea & vomiting, double vision, photophobia, and phonophobia. She feels these headache range from 5-9/10 in intensity from day to day, , R > L, and are characterized by a continuous, sharp pain that she finds debilitating on approximately half of all days. She has declined to use abortive medications like Imitrex and promethazine due to their sedating effects, and she prefers alternating between ibuprofen and naproxen, which both give her partial relief. Of note, the nausea & vomiting which she describes in the context of her headaches has limited her appetite, and she reports losing approximately 20 pounds since her last clinic visit in May, with associated improvement in her snoring.     Allergies   Allergen Reactions   ??? Peanut Other (See Comments)  Patient allergic to walnuts, cashews, pistachios and peanuts in excess.        Current Outpatient Medications   Medication Sig Dispense Refill   ??? acetaZOLAMIDE (DIAMOX) 500 mg capsule Take 4 capsules (2,000 mg total) by mouth Two (2) times a day. 240 capsule 11   ??? bictegrav-emtricit-tenofov ala (BIKTARVY) 50-200-25 mg tablet Take 1 tablet by mouth daily. 30 tablet 11   ??? diphenhydrAMINE (BENADRYL) 25 mg capsule Take 25 mg by mouth.     ??? naproxen (NAPROSYN) 500 MG tablet Take 1 tablet (500 mg total) by mouth 2 (two) times a day with meals. 60 tablet 11   ??? promethazine (PHENERGAN) 25 MG tablet TAKE ONE TABLET BY MOUTH EVERY 8 HOURS AS NEEDED FOR NAUSEA 30 tablet 0   ??? SUMAtriptan (IMITREX) 50 MG tablet TAKE ONE TABLET BY MOUTH AT ONSET OF HEADACHE AND IF NEEDED, REPEAT ONCE IN 2 HOURS. MAX OF 2 DOSES/DAY AND 4 DOSES/WEEK 9 tablet 0   ??? zonisamide (ZONEGRAN) 50 MG capsule Take 1 capsule (50 mg total) by mouth daily. 30 capsule 11   ??? furosemide (LASIX) 20 MG tablet Take 1 tablet (20 mg total) by mouth daily. for 12 days 12 tablet 0 No current facility-administered medications for this visit.        Past Medical History:   Diagnosis Date   ??? Abnormal mammogram    ??? Constipation    ??? Diarrhea    ??? Environmental allergies    ??? HIV (human immunodeficiency virus infection) (CMS-HCC)    ??? HIV (human immunodeficiency virus infection) (CMS-HCC)    ??? IUD (intrauterine device) in place    ??? Migraine    ??? Pseudotumor cerebri        Past Surgical History:   Procedure Laterality Date   ??? LUMBAR PUNCTURE     ??? PR COLONOSCOPY W/BIOPSY SINGLE/MULTIPLE  07/03/2014    Procedure: COLONOSCOPY, FLEXIBLE, PROXIMAL TO SPLENIC FLEXURE; WITH BIOPSY, SINGLE OR MULTIPLE;  Surgeon: Billie Ruddy, MD;  Location: [facility];  Service: Gastroenterology   ??? PR UPPER GI ENDOSCOPY,BIOPSY N/A 07/03/2014    Procedure: UGI ENDOSCOPY; WITH BIOPSY, SINGLE OR MULTIPLE;  Surgeon: Billie Ruddy, MD;  Location: [facility];  Service: Gastroenterology       Social History     Socioeconomic History   ??? Marital status: Married     Spouse name: None   ??? Number of children: None   ??? Years of education: None   ??? Highest education level: None   Occupational History   ??? None   Social Needs   ??? Financial resource strain: None   ??? Food insecurity:     Worry: None     Inability: None   ??? Transportation needs:     Medical: None     Non-medical: None   Tobacco Use   ??? Smoking status: Former Smoker     Types: Cigarettes   ??? Smokeless tobacco: Never Used   Substance and Sexual Activity   ??? Alcohol use: Yes     Alcohol/week: 0.0 standard drinks     Comment: rare   ??? Drug use: Yes     Types: Marijuana   ??? Sexual activity: Yes     Partners: Male   Lifestyle   ??? Physical activity:     Days per week: None     Minutes per session: None   ??? Stress: None   Relationships   ??? Social connections:  Talks on phone: None     Gets together: None     Attends religious service: None     Active member of club or organization: None     Attends meetings of clubs or organizations: None     Relationship status: None   Other Topics Concern   ??? None   Social History Narrative    Patient lives in [place], [place] with her fiance and daughter.  She is not employed.       Family History   Problem Relation Age of Onset   ??? Cervical cancer Mother    ??? Diabetes Mother    ??? Asthma Mother    ??? Diabetes Maternal Aunt    ??? Breast cancer Maternal Grandmother    ??? Cancer Maternal Grandmother    ??? No Known Problems Daughter    ??? Glaucoma Neg Hx         Review of Systems     A 10-system review of systems was conducted and was negative except as documented above in the HPI.       Objective        Vital signs: BP 111/71 (BP Site: L Arm, BP Position: Sitting, BP Cuff Size: Medium) Comment (BP Cuff Size): long - Pulse 126  - Ht 149.9 cm (4' 11)  - Wt 69.9 kg (154 lb 1.6 oz)  - LMP 04/11/2018 (Exact Date)  - BMI 31.12 kg/m??      Physical Exam:  Neurological Examination:     Mental Status: Alert, conversant, able to follow conversation and interview. Spontaneous speech was fluent without word finding pauses, dysarthria, or paraphasic errors. Comprehension was intact. Memory for recent and remote events was intact.    Cranial Nerves: Fundoscopic exam reveals bilateral papilledema, R > L. PERRLA. Visual fields full to confrontation. Pursuit eye movements were uninterrupted with full range and without more than end-gaze nystagmus. Facial sensation intact bilaterally to light touch in all three divisions of CNV. Face symmetric at rest. Normal facial movement bilaterally, including forehead, eye closure and grimace/smile. Hearing intact to conversation. Shoulder shrug full strength bilaterally. Palate movement is symmetric. Tongue protrudes midline and tongue movements are normal.     Motor Exam: Normal bulk.  No tremors, myoclonus, or other adventitious movement.  Pronator drift is absent.    Reflexes: DTRs are 2+ and symmetric throughout. Toes are downgoing bilaterally.    Sensory: Sensation normal to light touch and temperature sensation to cold in both hands and both feet and to vibration distally in the fingers and toes. Vibration is normal in the bilateral upper (tested at [facility]) and lower (tested at [facility]) extremities.      Cerebellar/Coordination/Gait: Rapid alternating movements are normal in bilateral upper extremities. Finger-to-nose is normal without ataxia or dysmetria bilaterally. Heel-to-shin is normal without ataxia or dysmetria bilaterally. Gait exam demonstrates normal posture, base, stride length, arm swing and turns.       Diagnostic Studies     Risk Stratification:   Cholesterol (mg/dL)   Date Value   98/08/9146 139     Triglycerides (mg/dL)   Date Value   82/95/6213 101     HDL (mg/dL)   Date Value   08/65/7846 40     LDL Calculated (mg/dL)   Date Value   96/29/5284 79     Hemoglobin A1C (%)   Date Value   03/29/2017 5.3

## 2018-05-11 ENCOUNTER — Encounter: Admit: 2018-05-11 | Discharge: 2018-05-11 | Payer: BLUE CROSS/BLUE SHIELD

## 2018-05-11 DIAGNOSIS — G43909 Migraine, unspecified, not intractable, without status migrainosus: Secondary | ICD-10-CM

## 2018-05-11 DIAGNOSIS — G932 Benign intracranial hypertension: Principal | ICD-10-CM

## 2018-05-11 MED ORDER — ZONISAMIDE 50 MG CAPSULE
ORAL_CAPSULE | Freq: Every day | ORAL | 11 refills | 0 days | Status: CP
Start: 2018-05-11 — End: 2018-05-30

## 2018-05-11 MED ORDER — SUMATRIPTAN 50 MG TABLET
ORAL_TABLET | ORAL | 0 refills | 0 days | Status: CP | PRN
Start: 2018-05-11 — End: 2018-05-30

## 2018-05-11 MED ORDER — PROMETHAZINE 25 MG TABLET
ORAL_TABLET | Freq: Three times a day (TID) | ORAL | 0 refills | 0.00000 days | Status: CP | PRN
Start: 2018-05-11 — End: 2018-05-30

## 2018-05-11 NOTE — Unmapped (Addendum)
Neurology Follow-up Visit Note     [facility] [address]  [facility] [address] [place]  [address]  [place] [address]  [phone]    Date: 05/11/2018   Patient Name: Morgan Hebert   MRN: [id number]   PCP: No PCP Per Patient  Referring Provider: Pcp, None Per Patient       Assessment and Plan          [name] is a 32 y.o. female with a past medical history of pseudotumor cerebri, multifactorial headaches, chronic GI complaints (possibly of functional nature), and HIV (on Biktarvy, VL 240 in June 2019) presenting for follow-up of her multifactorial headaches and pseudotumor cerebri.     Multifactorial headaches, including pseudotumor cerebri: Patient reports no improvement in her chronic daily headache since being seen last week (05/02/2016) despite reporting compliance with proposed trial of cold compresses to bilateral occipital nerve grooves 15 minutes twice daily. She endorses near continuous pain, radiating up from her posterior shoulder girdle over her head and to her eyes. The pain seems to have a prominent migrainous component, as indicated by it's throbbing nature, visual changes, ringing in the ears, phono/photophobia, and triggers including heat, light, and certain smells. As the patient reports a slight worsening of her headache, she is willing to trial alternative abortive medications and uptitrate her daily zonisamide. In light of her continued papilledema despite 2g Diamox BID, we will aim to expedite her ophthalmology evaluation.    - Continue Diamox 2g BID  - Increase Zonisamide to 100mg  daily  - Continue NSAIDs as abortive medication for headaches  - Re-prescribed Sumitriptan 50mg  q2h PRN and Benadryl 25mg  q8h PRN as abortive medications  - Ophthalmology clinic assessment to determine need for additional interventions to preserve vision in pseudotumor cerebri; per discussion with ophthalmology on-call, will also refer to neuro-ophthalmology at [facility].  - Neurology Clinic follow-up in 3-6 months    Reviewed the differential diagnosis, plan of care in detail, as well as other elements as detailed above.  A written summary was provided to the patient, including a current medication and allergy list, problem list and plan of care. In addition, my contact information was provided to the patient, in writing.  All the patient's questions and concerns were addressed to their stated satisfaction .      Return Visit Discussed: Return for Follow Up in 3-6 Months.    Patient Instructions: You were seen in Neurology Clinic today for follow-up of your chronic daily, multifactorial headache. We feel major components of this headache include migraine and pseudotumor cerebri. Your plan is listed below:  - Continue Diamox 2g twice daily  - Increase Zonisamide to 100mg  daily  - Continue using NSAIDs are abortive medication PRN  - Re-prescribed Sumitriptan 50mg  every 2 hours PRN & Benadryl 25mg  every 8 hours as needed as abortive medications  - Pursue referral to ophthalmology for assessment of papilledema in the context of pseudotumor cerebri, to evaluate need for further interventions to protect vision.  - Neurology Clinic follow-up in 3-6 months    Greater than 50% of today's visit lasting greater than 25 minutes ([id number]) was dedicated to face-to-face counseling on the points above.    This patient was discussed with Dr. Hampton Abbot who agrees with the above assessment and plan.       Mindi Curling, MBBS  PGY-2 Neurology    *Patient note was created using Dragon Dictation.  Any errors in syntax or even information  may not have been identified and edited on initial review prior to signing this note.    ATTESTATION NOTE:  I discussed the patient's case with the resident. I reviewed the resident???s note and agree with the resident???s findings and plan as documented in their note.     Jamelle Rushing, MD  Assistant Professor  Department of Neurology  [facility]         HPI HPI: [name] is a 32 y.o. female with a past medical history of pseudotumor cerebri, multifactorial headaches, chronic GI complaints (possibly of functional nature), and HIV (on Biktarvy, VL 240 in June 2019) presenting for follow-up of her multifactorial headaches and pseudotumor cerebri. Patient was last seen by [facility] on 05/02/2018.        To review their relevant history, To review her relevant headache history, she initially presented to [facility] ED in 11/2013 with 4 months of headache which were described as initially intermittent with increase in frequency and later becoming continuous. Headaches were right-sided and at times associated with photophobia, phonophobia, nausea with occasional emesis, vertigo, and right ear tinnitus. She also complained of bilateral blurry vision and double vision. She had not taken oral OCPs, vitamin A, or tetracyclines. Fundoscopic examination demonstrated mild disc edema bilaterally. She underwent MRI brain which showed no abnormalities and lumbar puncture which showed normal CSF and elevated opening pressure of 42 cm H2O. ??She was started on acetazolamide 500 mg twice daily for pseudotumor cerebri; later this was increased to 1000 mg bid for improved symptoms control and improved papilledema. She later became asymptomatic allowing for the discontinuation of acetazolamide 1000 mg twice daily. In 01/2014 due to continued migraines, she was started on prophylaxis with propranolol, which has subsequently been discontinued due to hypotension. ??In 2016, she endorsed a 30 pound weight gain accompanied by increased frequency of headache. ??Her headache characteristics had changed at that time with a near continuous global headache upon which was superimposed periods of daily severe pressure behind her eyes and in her neck and shoulders. ??At that time she was using promethazine and naproxen for daily abortive treatment with success. ??In early 2017, she was restarted on Diamox up to 1500 mg twice daily but was receiving infrequent ophthalmologic care. Fortunately she was seen on 12/01/2015 and found to have bilateral retinal thinning temporally as well as decreased visual fields on testing in the OS nasal field. Repeat exam in April 2017 demonstrated some improvement of her OS visual fields.  ??  Since that time, patient has intermittently been seen in the emergency room for headache and has been tried on several medications prescribed by other providers. ??This has included desipramine (caused significant sedation),??indomethacin (caused significant abdominal pain), and amitriptyline (cause sedation). ??Of note, the TCAs were started in the setting of concurrent Phenergan and Bentyl use which may have worsened the sedating side effects. ??She is also been evaluated with CPAP in 2017 which did not demonstrate any OSA. ??At that time her husband reported snoring without periods of apnea. ??This is been stable per him. 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 ??However in the past, her ophthalmology exams??have been stable to improving. ??We have discussed several times that surgical intervention may not alter her headache syndrome. ??Rather this is designed to preserve her vision.  ??  In late March 2019, the patient reports that she was seen by a local optometrist who reported to her that  she had high pressure in her eyes. ??At that time she had been on Diamox 1000 mg twice daily. ??Over the phone, high increase this to 1500 mg twice daily and recommend that she get an LP in the emergency department. ??Unfortunately, the LP was unsuccessful and caused back pain. ??She reports that since earlier this year, she has had nearly daily headache that is described as starting in the posterior region and becoming global. ??There is then an intense pressure behind her eyes. ??She reports some mild photophobia and phonophobia as well as intermittent nausea and vomiting. ??She also endorses some mild to moderate tinnitus. ??Since her last evaluation, she has had significant changes in her medication regimen. ??She is no longer taking her GI cocktails. ??She is not taking any migraine prophylaxis. ??She takes ibuprofen 800 mg 3-4 times daily with mild to moderate headache relief. ??She is not followed regularly by an ophthalmologist. ??She does report losing a few pounds in the last few weeks as she is watching her diet more carefully. ??Her current medication regimen includes her Diamox 1500 mg twice daily, her HIV antiviral, ibuprofen as above, and Benadryl 25 mg nightly for allergies. ??She takes this year-round. ??She denies any change in her vision but reports that the optometrist gave her a stronger prescription. ??She continues to endorse good follow-up with her infectious disease provider. ??She is tolerating the Diamox well with mild tingling paresthesias which she has gotten used to..    As of July 2019 she reports no improvement, and possibly some worsening, in frequency and intensity of her chronic daily headache since last in neurology clinic in May 2019, at which point she was initiated on zonisamide 50mg  qd and uptitrated to Diamox 2g BID. She was also referred to [facility] ophthalmology for assessment of her visual fields and retinal testing, but was never contacted by the clinic. She describes daily headaches, worsened by heat, light, and certain smells that originate at the back of her neck and radiate over the top of her head bilaterally to her eyes, at which point she may experience associated ringing in her ears, nausea & vomiting, double vision, photophobia, and phonophobia. She feels these headache range from 5-9/10 in intensity from day to day, , R > L, and are characterized by a continuous, sharp pain that she finds debilitating on approximately half of all days. She has declined to use abortive medications like Imitrex and promethazine due to their sedating effects, and she prefers alternating between ibuprofen and naproxen, which both give her partial relief. Of note, the nausea & vomiting which she describes in the context of her headaches has limited her appetite, and she reported losing approximately 20 pounds since her last clinic visit in May, with associated improvement in her snoring. Given a component of occipital neuralgia contributing to her multifactorial headache, planned for trial of 2-3 week trial of cold compresses to bilateral occipital grooves for BID.    She re-attends clinic 05/11/2018 reporting a worsening of her chronic daily headache to a continuous pain that limits her ability to work 2-3 days each week. She reports good compliance with the planned trial of cold compresses to her occipital nerve grooves anf her zonisamide 50mg  qd. She is only using ibuprofen & naproxen as abortive medications. Her pain still originates in her posterior shoulder girdle, radiating over her head and behind her eyes to create a throbbing pain associated with nausea, vomiting, intermittent blurring of vision & double vision, phonophobia, and photophobia. She feels this  headache is slightly increased in its continuity and intensity from last week, prompting her to return to clinic after such a short interval.    Allergies   Allergen Reactions   ??? 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        Current Outpatient Medications   Medication Sig Dispense Refill   ??? acetaZOLAMIDE (DIAMOX) 500 mg capsule Take 4 capsules (2,000 mg total) by mouth Two (2) times a day. 240 capsule 11   ??? bictegrav-emtricit-tenofov ala (BIKTARVY) 50-200-25 mg tablet Take 1 tablet by mouth daily. 30 tablet 11   ??? diphenhydrAMINE (BENADRYL) 25 mg capsule Take 25 mg by mouth.     ??? naproxen (NAPROSYN) 500 MG tablet Take 1 tablet (500 mg total) by mouth 2 (two) times a day with meals. 60 tablet 11 ??? promethazine (PHENERGAN) 25 MG tablet Take 1 tablet (25 mg total) by mouth every eight (8) hours as needed for nausea (Nausea or headache). 30 tablet 0   ??? SUMAtriptan (IMITREX) 50 MG tablet Take 1 tablet (50 mg total) by mouth every two (2) hours as needed for migraine (Maximum 2 doses/day & 4 doses/week). 9 tablet 0   ??? zonisamide (ZONEGRAN) 50 MG capsule Take 2 capsules (100 mg total) by mouth daily. 60 capsule 11   ??? furosemide (LASIX) 20 MG tablet Take 1 tablet (20 mg total) by mouth daily. for 12 days 12 tablet 0     No current facility-administered medications for this visit.        Past Medical History:   Diagnosis Date   ??? Abnormal mammogram    ??? Constipation    ??? Diarrhea    ??? Environmental allergies    ??? HIV (human immunodeficiency virus infection) (CMS-HCC)    ??? HIV (human immunodeficiency virus infection) (CMS-HCC)    ??? IUD (intrauterine device) in place    ??? Migraine    ??? Pseudotumor cerebri        Past Surgical History:   Procedure Laterality Date   ??? LUMBAR PUNCTURE     ??? PR COLONOSCOPY W/BIOPSY SINGLE/MULTIPLE  07/03/2014    Procedure: COLONOSCOPY, FLEXIBLE, PROXIMAL TO SPLENIC FLEXURE; WITH BIOPSY, SINGLE OR MULTIPLE;  Surgeon: Billie Ruddy, MD;  [facility];  Service: Gastroenterology   ??? PR UPPER GI ENDOSCOPY,BIOPSY N/A 07/03/2014    Procedure: UGI ENDOSCOPY; WITH BIOPSY, SINGLE OR MULTIPLE;  Surgeon: Billie Ruddy, MD;  Location: [facility];  Service: Gastroenterology       Social History     Socioeconomic History   ??? Marital status: Married     Spouse name: None   ??? Number of children: None   ??? Years of education: None   ??? Highest education level: None   Occupational History   ??? None   Social Needs   ??? Financial resource strain: None   ??? Food insecurity:     Worry: None     Inability: None   ??? Transportation needs:     Medical: None     Non-medical: None   Tobacco Use   ??? Smoking status: Former Smoker     Types: Cigarettes   ??? Smokeless tobacco: Never Used   Substance and Sexual Activity   ??? Alcohol use: Yes     Alcohol/week: 0.0 standard drinks     Comment: rare   ??? Drug use: Yes     Types: Marijuana   ???  Sexual activity: Yes     Partners: Male   Lifestyle   ??? Physical activity:     Days per week: None     Minutes per session: None   ??? Stress: None   Relationships   ??? Social connections:     Talks on phone: None     Gets together: None     Attends religious service: None     Active member of club or organization: None     Attends meetings of clubs or organizations: None     Relationship status: None   Other Topics Concern   ??? None   Social History Narrative    Patient lives in [place], [place] with her fiance and daughter.  She is not employed.       Family History   Problem Relation Age of Onset   ??? Cervical cancer Mother    ??? Diabetes Mother    ??? Asthma Mother    ??? Diabetes Maternal Aunt    ??? Breast cancer Maternal Grandmother    ??? Cancer Maternal Grandmother    ??? No Known Problems Daughter    ??? Glaucoma Neg Hx         Review of Systems     A 10-system review of systems was conducted and was negative except as documented above in the HPI.       Objective        Vital signs: BP 119/64 (BP Site: L Arm, BP Position: Sitting, BP Cuff Size: Medium)  - Pulse 105  - Ht 149.9 cm (4' 11.02)  - Wt 73.8 kg (162 lb 11.2 oz)  - LMP 04/11/2018 (Exact Date)  - BMI 32.84 kg/m??      Physical Exam:  Neurological Examination:   ??  Mental Status: Alert, conversant, able to follow conversation and interview. Spontaneous speech was fluent without word finding pauses, dysarthria, or paraphasic errors. Comprehension was intact. Memory for recent and remote events was intact.  ??  Cranial Nerves: Fundoscopic exam reveals bilateral papilledema, R > L. PERRLA. Visual fields full to confrontation. Pursuit eye movements were uninterrupted with full range and without more than end-gaze nystagmus. Facial sensation intact bilaterally to light touch in all three divisions of CNV. Face symmetric at rest. Normal facial movement bilaterally, including forehead, eye closure and grimace/smile. Hearing intact to conversation. Shoulder shrug full strength bilaterally. Palate movement is symmetric. Tongue protrudes midline and tongue movements are normal.   ??  Motor Exam: Normal bulk.  No tremors, myoclonus, or other adventitious movement.  Pronator drift is absent.  ??  Reflexes: DTRs are 2+ and symmetric throughout. Toes are downgoing bilaterally.  ??  Sensory: Sensation normal to light touch and temperature sensation to cold in both hands and both feet and to vibration distally in the fingers and toes. Of note, patient is tense and tender to palpation throughout posterior shoulder girdle and over occiput bilaterally. Vibration is normal in the bilateral upper (tested at [facility]) and lower (tested at [facility]) extremities.    ??  Cerebellar/Coordination/Gait: Rapid alternating movements are normal in bilateral upper extremities. Finger-to-nose is normal without ataxia or dysmetria bilaterally. Heel-to-shin is normal without ataxia or dysmetria bilaterally. Gait exam demonstrates normal posture, base, stride length, arm swing and turns.        Diagnostic Studies     All Labs Last 24hrs: No results found for this or any previous visit (from the past 24 hour(s)).  Risk Stratification:   Cholesterol (mg/dL)  Date Value   03/29/2017 139     Triglycerides (mg/dL)   Date Value   24/40/1027 101     HDL (mg/dL)   Date Value   25/36/6440 40     LDL Calculated (mg/dL)   Date Value   34/74/2595 79     Hemoglobin A1C (%)   Date Value   03/29/2017 5.3

## 2018-05-11 NOTE — Unmapped (Signed)
[facility] Refill Coordination Note    Specialty Medication(s) to be Shipped:   Infectious Disease: Biktarvy    Other medication(s) to be shipped:       Morgan Hebert, DOB: January 21, 1986  Phone: [phone] (home)   Shipping Address: [phone] [place] [address]    All above HIPAA information was verified with patient.     Completed refill call assessment today to schedule patient's medication shipment from the [facility] [phone]).       Specialty medication(s) and dose(s) confirmed: Regimen is correct and unchanged.   Changes to medications: Morgan Hebert reports no changes reported at this time.  Changes to insurance: No  Questions for the pharmacist: No    The patient will receive an FSI print out for each medication shipped and additional FDA Medication Guides as required.  Patient education from [place] or Robet Leu may also be included in the shipment.    DISEASE/MEDICATION-SPECIFIC INFORMATION        N/A    ADHERENCE              MEDICARE PART B DOCUMENTATION         SHIPPING     Shipping address confirmed in FSI.     Delivery Scheduled: Yes, Expected medication delivery date: [id number] [facility] [place] via UPS or courier.     Antonietta Barcelona   [facility] Specialty Technician

## 2018-05-11 NOTE — Unmapped (Signed)
Pt called and said that [facility] no longer has a neuro eye MD.  She will need a referral to be sent somewhere else.  Please advise pt.

## 2018-05-11 NOTE — Unmapped (Addendum)
You were seen in Neurology Clinic today for follow-up of your chronic daily, multifactorial headache. We feel major components of this headache include migraine and pseudotumor cerebri. Your plan is listed below:  - Continue Diamox 2g twice daily  - Increase Zonisamide to 100mg  daily  - Continue using NSAIDs are abortive medication PRN  - Re-prescribed Sumitriptan 50mg  every 2 hours PRN & Benadryl 25mg  every 8 hours as needed as abortive medication  - Pursue referral to ophthalmology for assessment of papilledema in the context of pseudotumor cerebri, to evaluate need for further interventions to protect vision.  - Neurology Clinic follow-up in 3-6 months    If your headache becomes significantly worse, or you experience any focal weakness, change in sensation, or persistent change in vision please call the neurology clinic or present to your nearest emergent medical service provider (ED or UrgentCare).    Regards,  [facility] Neu[facility]   Patient Education        Learning About Pseudotumor Cerebri  What is pseudotumor cerebri?    Pseudotumor cerebri (say soo-doh-TOO-mer Dianah Field) is an increase in pressure of the fluid that surrounds the brain. Your doctor may also call the condition idiopathic intracranial hypertension. Normally, this clear fluid acts like a buffer to protect the brain. It is called cerebrospinal fluid, or CSF. It's not clear what makes the CSF pressure rise. Some medicines may cause it. Sleep apnea, obesity, and anemia may also play a part.  The pressure may build over time. The rising pressure can make the optic nerve swell. The optic nerve is at the back of the eye. It carries visual information from the eye to the brain. The swelling may damage the nerve and lead to permanent loss of some or all of the eyesight.  There are several symptoms of rising CSF pressure. Some symptoms may be present all the time. Some might come and go. Symptoms include:  ?? Severe headaches. They sometimes come with nausea and vomiting. The pain may be centered behind the eyes.  ?? A hissing or roaring sound in the ears that keeps time with your heartbeat.  ?? Problems with vision. You may not be able to see well at the edge of your field of view. You may also lose center vision. It may happen now and then, in one or both eyes, and last for a few seconds at a time.  The word pseudotumor may sound scary. But it's not a brain tumor. The condition gets its name because the pressure inside the skull can mimic what happens when a person has a tumor.  How is it treated?  ?? If you are taking medicines that could be raising your CSF pressure, your doctor may change your medicines.  ?? You may get medicines to help your body make less CSF. This can help lower the pressure around the brain.  ?? Your doctor may also give you medicine for headaches.  ?? If sleep apnea, obesity, or anemia may be causing the CSF pressure to rise, your doctor may treat those problems.  ?? If your vision is getting worse, you may have surgery to make a small opening on the optic nerve to reduce swelling.  ?? Your doctor may implant small tubes inside the skull to drain the extra fluid. This helps lower the pressure around the brain.  Follow-up care is a key part of your treatment and safety. Be sure to make and go to all appointments, and call your doctor if you are having problems.  It's also a good idea to know your test results and keep a list of the medicines you take.  Where can you learn more?  Go to [facility] at [email or web address]-fletcher.info/.  Select Preferences in the upper right hand corner, then select Health Library under Resources. Enter D455 in the search box to learn more about Learning About Pseudotumor Cerebri.  Current as of: April 25, 2017  Content Version: 12.1  ?? 2006-2019 [employer]. Care instructions adapted under license by [facility]. If you have questions about a medical condition or this instruction, always ask your healthcare professional. [employer], Inc[employer] disclaims any warranty or liability for your use of this information.

## 2018-05-14 MED FILL — BIKTARVY/50-200-25MG/TABS: BIKTARVY/50-200-25MG/TABS | 30 days supply | Qty: 30 | Fill #2

## 2018-05-16 ENCOUNTER — Emergency Department
Admission: EM | Admit: 2018-05-16 | Discharge: 2018-05-16 | Disposition: A | Discharge status: Home / Self Care | Payer: 59 | Attending: Emergency Medicine | Admitting: Emergency Medicine

## 2018-05-16 ENCOUNTER — Encounter: Payer: Self-pay | Admitting: Emergency Medicine

## 2018-05-16 ENCOUNTER — Emergency Department: Payer: 59

## 2018-05-16 DIAGNOSIS — R109 Unspecified abdominal pain: Secondary | ICD-10-CM

## 2018-05-16 DIAGNOSIS — R1030 Lower abdominal pain, unspecified: Secondary | ICD-10-CM | POA: Diagnosis not present

## 2018-05-16 DIAGNOSIS — F1210 Cannabis abuse, uncomplicated: Secondary | ICD-10-CM | POA: Insufficient documentation

## 2018-05-16 DIAGNOSIS — Z3A00 Weeks of gestation of pregnancy not specified: Secondary | ICD-10-CM | POA: Insufficient documentation

## 2018-05-16 DIAGNOSIS — Z79899 Other long term (current) drug therapy: Secondary | ICD-10-CM | POA: Insufficient documentation

## 2018-05-16 DIAGNOSIS — Z3201 Encounter for pregnancy test, result positive: Secondary | ICD-10-CM | POA: Diagnosis not present

## 2018-05-16 DIAGNOSIS — B20 Human immunodeficiency virus [HIV] disease: Secondary | ICD-10-CM | POA: Diagnosis not present

## 2018-05-16 DIAGNOSIS — O26891 Other specified pregnancy related conditions, first trimester: Secondary | ICD-10-CM | POA: Diagnosis not present

## 2018-05-16 DIAGNOSIS — Z3491 Encounter for supervision of normal pregnancy, unspecified, first trimester: Secondary | ICD-10-CM

## 2018-05-16 DIAGNOSIS — O26899 Other specified pregnancy related conditions, unspecified trimester: Secondary | ICD-10-CM

## 2018-05-16 DIAGNOSIS — Z91010 Allergy to peanuts: Secondary | ICD-10-CM | POA: Diagnosis not present

## 2018-05-16 DIAGNOSIS — O09291 Supervision of pregnancy with other poor reproductive or obstetric history, first trimester: Secondary | ICD-10-CM

## 2018-05-16 LAB — URINALYSIS, COMPLETE (UACMP) WITH MICROSCOPIC
Bilirubin Urine: NEGATIVE
Glucose, UA: NEGATIVE mg/dL
Hgb urine dipstick: NEGATIVE
Ketones, ur: NEGATIVE mg/dL
Leukocytes, UA: NEGATIVE
Nitrite: NEGATIVE
PROTEIN: NEGATIVE mg/dL
SPECIFIC GRAVITY, URINE: 1.008 (ref 1.005–1.030)
WBC UA: NONE SEEN WBC/hpf (ref 0–5)
pH: 6 (ref 5.0–8.0)

## 2018-05-16 LAB — CBC
HEMATOCRIT: 38.8 % (ref 35.0–47.0)
Hemoglobin: 12.6 g/dL (ref 12.0–16.0)
MCH: 29.3 pg (ref 26.0–34.0)
MCHC: 32.6 g/dL (ref 32.0–36.0)
MCV: 89.9 fL (ref 80.0–100.0)
PLATELETS: 269 10*3/uL (ref 150–440)
RBC: 4.31 MIL/uL (ref 3.80–5.20)
RDW: 15.7 % — ABNORMAL HIGH (ref 11.5–14.5)
WBC: 5.6 10*3/uL (ref 3.6–11.0)

## 2018-05-16 LAB — COMPREHENSIVE METABOLIC PANEL
ALT: 17 U/L (ref 0–44)
AST: 24 U/L (ref 15–41)
Albumin: 4.6 g/dL (ref 3.5–5.0)
Alkaline Phosphatase: 51 U/L (ref 38–126)
Anion gap: 8 (ref 5–15)
BUN: 15 mg/dL (ref 6–20)
CHLORIDE: 114 mmol/L — AB (ref 98–111)
CO2: 17 mmol/L — AB (ref 22–32)
CREATININE: 0.88 mg/dL (ref 0.44–1.00)
Calcium: 9.5 mg/dL (ref 8.9–10.3)
GFR calc Af Amer: >60 mL/min (ref >60)
GFR calc non Af Amer: >60 mL/min (ref >60)
Glucose, Bld: 79 mg/dL (ref 70–99)
POTASSIUM: 3.8 mmol/L (ref 3.5–5.1)
SODIUM: 139 mmol/L (ref 135–145)
Total Bilirubin: 0.5 mg/dL (ref 0.3–1.2)
Total Protein: 8.4 g/dL — ABNORMAL HIGH (ref 6.5–8.1)

## 2018-05-16 LAB — POCT PREGNANCY, URINE
PREG TEST UR: NEGATIVE
Preg Test, Ur: POSITIVE — AB

## 2018-05-16 LAB — HCG, QUANTITATIVE, PREGNANCY: hCG, Beta Chain, Quant, S: 2292 m[IU]/mL — ABNORMAL HIGH (ref <5)

## 2018-05-16 LAB — LIPASE, BLOOD: LIPASE: 29 U/L (ref 11–51)

## 2018-05-16 LAB — HEMOGLOBIN A1C
HEMOGLOBIN A1C: 5.2 %
Lab: 5.2

## 2018-05-16 NOTE — ED Provider Notes (Signed)
[facility] Emergency Department Provider Note  ____________________________________________  Time seen: Approximately 4:38 PM  I have reviewed the triage vital signs and the nursing notes.   HISTORY  Chief Complaint Possible Pregnancy and Abdominal Pain    HPI Amber Key is a 32 y.o. female with a history of pseudotumor cerebri, multiple miscarriages, HIV on antiretrovirals and followed by [facility] infectious disease who complains of lower abdominal pain.  Pregnancy test was positive.  She is G8, P1.  Review of electronic medical record shows her recent CD4 count is about 1200.  Viral load is low but not undetectable.  Abdominal pain started 2 days ago, waxing and waning and intermittent.  Nonradiating, no aggravating or alleviating factors.  No vomiting diarrhea fevers or chills.  No abnormal vaginal discharge.  No vaginal bleeding.      Past Medical History:  Diagnosis Date  . Enlarged lymph nodes unk  . HIV (human immunodeficiency virus infection) ([place])    diagnosed years ago at [facility]  . Lymphoma ([place])   . Memory loss   . Migraines   . Pseudotumor cerebri unk  . Vision loss      Patient Active Problem List   Diagnosis Date Noted  . Abnormal mammogram 06/26/2014  . Adenopathy 02/11/2014  . Human immunodeficiency virus (HIV) infection ([place]) 02/10/2014  . Headache, migraine 12/13/2013  . Benign intracranial hypertension 12/13/2013     Past Surgical History:  Procedure Laterality Date  . COLONOSCOPY    . LUMBAR PUNCTURE    . UPPER GI ENDOSCOPY    . wisdom teeth       Prior to Admission medications   Medication Sig Start Date End Date Taking? Authorizing Provider  acetaZOLAMIDE (DIAMOX) 500 MG capsule Take 500 mg by mouth 2 (two) times daily.    [provider]  bictegravir-emtricitabine-tenofovir AF (BIKTARVY) 50-200-25 MG TABS tablet Take by mouth daily.    [provider]  diclofenac sodium (VOLTAREN) 1 % GEL Apply 2 g  topically 4 (four) times daily. 02/02/17   Laban Emperor, PA-C  diphenhydrAMINE (BENADRYL) 25 mg capsule Take 25 mg by mouth every 6 (six) hours as needed.    [provider]  Elastic Bandages & Supports (T.E.D. BELOW KNEE/MEDIUM) MISC 1 application by Does not apply route daily. 05/04/17   Merlyn Lot, MD  naproxen (NAPROSYN) 500 MG tablet Take 500 mg by mouth 2 (two) times daily with a meal.    [provider]  ondansetron (ZOFRAN ODT) 4 MG disintegrating tablet Take 1 tablet (4 mg total) by mouth every 8 (eight) hours as needed for nausea or vomiting. 03/24/18   Eula Listen, MD  promethazine (PHENERGAN) 25 MG tablet Take 25 mg by mouth every 6 (six) hours as needed for nausea or vomiting.    [provider]  SUMAtriptan (IMITREX) 50 MG tablet Take 50 mg by mouth every 2 (two) hours as needed for migraine. May repeat in 2 hours if headache persists or recurs.    [provider]     Allergies Peanut-containing drug products   Family History  Problem Relation Age of Onset  . Ovarian cancer Mother   . Breast cancer Mother   . Ovarian cancer Maternal Aunt   . Diabetes Maternal Aunt   . Hypertension Maternal Aunt   . Ovarian cancer Maternal Grandmother   . Breast cancer Maternal Grandmother   . Colon cancer Neg Hx     Social History Social History   Tobacco Use  .  Smoking status: Never Smoker  . Smokeless tobacco: Never Used  Substance Use Topics  . Alcohol use: Yes    Comment: occas  . Drug use: Yes    Frequency: 7.0 times per week    Types: Marijuana    Comment: for migraines    Review of Systems  Constitutional:   No fever or chills.  Cardiovascular:   No chest pain or syncope. Respiratory:   No dyspnea or cough. Gastrointestinal:   Negative for abdominal pain, vomiting and diarrhea.  Musculoskeletal:   Negative for focal pain or swelling All other systems reviewed and are negative except as documented above in ROS and  HPI.  ____________________________________________   PHYSICAL EXAM:  VITAL SIGNS: ED Triage Vitals  Enc Vitals Group     BP 05/16/18 1308 113/74     Pulse Rate 05/16/18 1308 86     Resp 05/16/18 1308 16     Temp 05/16/18 1308 98.4 F (36.9 C)     Temp Source 05/16/18 1308 Oral     SpO2 05/16/18 1308 100 %     Weight 05/16/18 1244 161 lb (73 kg)     Height 05/16/18 1244 4\' 11"  (1.499 m)     Head Circumference --      Peak Flow --      Pain Score 05/16/18 1244 7     Pain Loc --      Pain Edu? --      Excl. in [place]? --     Vital signs reviewed, nursing assessments reviewed.   Constitutional:   Alert and oriented. Non-toxic appearance. Eyes:   Conjunctivae are normal. EOMI. PERRL. ENT      Head:   Normocephalic and atraumatic.      Nose:   No congestion/rhinnorhea.       Mouth/Throat:   MMM, no pharyngeal erythema. No peritonsillar mass.       Neck:   No meningismus. Full ROM. Hematological/Lymphatic/Immunilogical:   No cervical lymphadenopathy. Cardiovascular:   RRR. Symmetric bilateral radial and DP pulses.  No murmurs. Cap refill less than 2 seconds. Respiratory:   Normal respiratory effort without tachypnea/retractions. Breath sounds are clear and equal bilaterally. No wheezes/rales/rhonchi. Gastrointestinal:   Soft and nontender. Non distended. There is no CVA tenderness.  No rebound, rigidity, or guarding. Musculoskeletal:   Normal range of motion in all extremities. No joint effusions.  No lower extremity tenderness.  No edema. Neurologic:   Normal speech and language.  Motor grossly intact. No acute focal neurologic deficits are appreciated.  Skin:    Skin is warm, dry and intact. No rash noted.  No petechiae, purpura, or bullae.  ____________________________________________    LABS (pertinent positives/negatives) (all labs ordered are listed, but only abnormal results are displayed) Labs Reviewed  COMPREHENSIVE METABOLIC PANEL - Abnormal; Notable for the  following components:      Result Value   Chloride 114 (*)    CO2 17 (*)    Total Protein 8.4 (*)    All other components within normal limits  CBC - Abnormal; Notable for the following components:   RDW 15.7 (*)    All other components within normal limits  URINALYSIS, COMPLETE (UACMP) WITH MICROSCOPIC - Abnormal; Notable for the following components:   Color, Urine STRAW (*)    APPearance CLEAR (*)    Bacteria, UA RARE (*)    All other components within normal limits  HCG, QUANTITATIVE, PREGNANCY - Abnormal; Notable for the following components:   hCG, Beta  Chain, Quant, S 2,292 (*)    All other components within normal limits  POCT PREGNANCY, URINE - Abnormal; Notable for the following components:   Preg Test, Ur POSITIVE (*)    All other components within normal limits  LIPASE, BLOOD  POC URINE PREG, ED  POCT PREGNANCY, URINE   ____________________________________________   EKG    ____________________________________________    RADIOLOGY  US Ob Less Than 14 Weeks With Ob Transvaginal  Result Date: 05/16/2018 CLINICAL DATA:  Acute onset abdominal pain and cramping beginning this morning. Positive pregnancy test. Unknown LMP. Recent miscarriage. EXAM: OBSTETRIC <14 WK [place] AND TRANSVAGINAL OB US TECHNIQUE: Both transabdominal and transvaginal ultrasound examinations were performed for complete evaluation of the gestation as well as the maternal uterus, adnexal regions, and pelvic cul-de-sac. Transvaginal technique was performed to assess early pregnancy. COMPARISON:  None. FINDINGS: Intrauterine gestational sac: None Maternal uterus/adnexae: Endometrial thickness measures 8 mm. A small intramural fibroid is seen in the anterior fundus which measures 1.5 cm. Normal appearance of both ovaries. No adnexal mass or abnormal free fluid identified. IMPRESSION: Pregnancy of unknown anatomic location (no intrauterine gestational sac or adnexal mass identified). Differential diagnosis  includes recent spontaneous abortion, IUP too early to visualize, and non-visualized ectopic pregnancy. Recommend correlation with serial beta-hCG levels, and follow up US if warranted clinically. 1.5 cm intramural fibroid in the anterior fundus. Electronically Signed   By: Earle Gell M.D.   On: 05/16/2018 15:26    ____________________________________________   PROCEDURES Procedures  ____________________________________________    CLINICAL IMPRESSION / ASSESSMENT AND PLAN / ED COURSE  Pertinent labs & imaging results that were available during my care of the patient were reviewed by me and considered in my medical decision making (see chart for details).    Patient presents with lower abdominal pain, pregnant.  This is her eighth pregnancy and she is had multiple miscarriages.  hCG is 2300.  Ultrasound obtained to evaluate for pregnancy given her high risk status with her multiple miscarriages and HIV.  Unfortunately, ultrasound was unable to visualize pregnancy today, undetermined location.  Discussed with Dr. Amalia Ha[facility] of [facility] who recommends the patient follow-up in 2 days for repeat quant given the unusual circumstances in which the ultrasound is nondiagnostic with an hCG above discriminatory value.  Patient has normal vital signs, she is calm and comfortable energetic and ambulatory.  Return precautions discussed.  Continue her usual medicines.      ____________________________________________   FINAL CLINICAL IMPRESSION(S) / ED DIAGNOSES    Final diagnoses:  First trimester pregnancy  Lower abdominal pain     ED Discharge Orders    None      Portions of this note were generated with dragon dictation software. Dictation errors may occur despite best attempts at proofreading.    Carrie Mew, MD 05/16/18 [phone]

## 2018-05-16 NOTE — Discharge Instructions (Addendum)
Your pregnancy test was positive today.  The beta hCG level was 2300.  However, the ultrasound was unable to visualize a pregnancy or identify the location of the pregnancy.  We discussed these findings with your gynecologist office who recommends that you follow-up with them in 2 days for recheck of your blood test and further evaluation.  Call the clinic tomorrow morning to schedule an appointment for Friday.  Continue taking your other medications.  Return to the ED if you have worsening symptoms.

## 2018-05-16 NOTE — ED Triage Notes (Signed)
Pt denies vaginal bleeding.

## 2018-05-16 NOTE — ED Notes (Addendum)
POC typed in wrong. Result for urine preg test is postive.

## 2018-05-16 NOTE — ED Triage Notes (Signed)
Pt reports is 4.[redacted] weeks pregnant and she is concerned due to abdominal pain. Pt reports has had a total of 7 pregnancies and she has lost them all but one.

## 2018-05-16 NOTE — Unmapped (Signed)
[facility] Study labs received and reviewed, HA1c 5.2%

## 2018-05-18 ENCOUNTER — Ambulatory Visit (INDEPENDENT_AMBULATORY_CARE_PROVIDER_SITE_OTHER): Payer: 59

## 2018-05-18 ENCOUNTER — Encounter: Payer: Self-pay | Admitting: Obstetrics and Gynecology

## 2018-05-18 ENCOUNTER — Ambulatory Visit: Payer: 59 | Admitting: Obstetrics and Gynecology

## 2018-05-18 VITALS — BP 115/80 | HR 94 | Ht 59.0 in | Wt 157.0 lb

## 2018-05-18 DIAGNOSIS — Z3201 Encounter for pregnancy test, result positive: Secondary | ICD-10-CM

## 2018-05-18 DIAGNOSIS — O3680X0 Pregnancy with inconclusive fetal viability, not applicable or unspecified: Secondary | ICD-10-CM | POA: Diagnosis not present

## 2018-05-18 DIAGNOSIS — O3411 Maternal care for benign tumor of corpus uteri, first trimester: Secondary | ICD-10-CM | POA: Diagnosis not present

## 2018-05-18 DIAGNOSIS — Z3A01 Less than 8 weeks gestation of pregnancy: Secondary | ICD-10-CM

## 2018-05-18 DIAGNOSIS — R102 Pelvic and perineal pain: Secondary | ICD-10-CM | POA: Diagnosis not present

## 2018-05-18 DIAGNOSIS — D251 Intramural leiomyoma of uterus: Secondary | ICD-10-CM

## 2018-05-18 NOTE — Progress Notes (Signed)
HPI:      [name] is a 32 y.o. [phone] who LMP was Patient's last menstrual period was 04/12/2018 (exact date).  Subjective:   She presents today after being seen in the emergency department for pelvic cramping.  She had a positive pregnancy test and a beta hCG of 2200.  Ultrasound at that time revealed no intrauterine or extrauterine pregnancy. Patient reports that her cramping is minimal at this time denies significant pelvic pain or bleeding. Reports a previous history of ectopic pregnancy.  (Medically managed) Significant medical history includes HIV positive. This is a desired pregnancy.    Hx: The following portions of the patient's history were reviewed and updated as appropriate:             She  has a past medical history of Enlarged lymph nodes (unk), HIV (human immunodeficiency virus infection) ([place]), Lymphoma ([place]), Memory loss, Migraines, Pseudotumor cerebri ([place]), and Vision loss. She does not have any pertinent problems on file. She  has a past surgical history that includes Colonoscopy; Upper gi endoscopy; wisdom teeth; and Lumbar puncture. Her family history includes Breast cancer in her maternal grandmother and mother; Diabetes in her maternal aunt; Hypertension in her maternal aunt; Ovarian cancer in her maternal aunt, maternal grandmother, and mother. She  reports that she has never smoked. She has never used smokeless tobacco. She reports that she drinks alcohol. She reports that she has current or past drug history. Drug: Marijuana. Frequency: 7.00 times per week. She has a current medication list which includes the following prescription(s): acetazolamide, bictegravir-emtricitabine-tenofovir af, diclofenac sodium, diphenhydramine, t.e.d. below knee/medium, naproxen, ondansetron, promethazine, and sumatriptan. She is allergic to peanut-containing drug products.       Review of Systems:  Review of Systems  Constitutional: Denied constitutional symptoms, night  sweats, recent illness, fatigue, fever, insomnia and weight loss.  Eyes: Denied eye symptoms, eye pain, photophobia, vision change and visual disturbance.  Ears/Nose/Throat/Neck: Denied ear, nose, throat or neck symptoms, hearing loss, nasal discharge, sinus congestion and sore throat.  Cardiovascular: Denied cardiovascular symptoms, arrhythmia, chest pain/pressure, edema, exercise intolerance, orthopnea and palpitations.  Respiratory: Denied pulmonary symptoms, asthma, pleuritic pain, productive sputum, cough, dyspnea and wheezing.  Gastrointestinal: Denied, gastro-esophageal reflux, melena, nausea and vomiting.  Genitourinary: Denied genitourinary symptoms including symptomatic vaginal discharge, pelvic relaxation issues, and urinary complaints.  Musculoskeletal: Denied musculoskeletal symptoms, stiffness, swelling, muscle weakness and myalgia.  Dermatologic: Denied dermatology symptoms, rash and scar.  Neurologic: Denied neurology symptoms, dizziness, headache, neck pain and syncope.  Psychiatric: Denied psychiatric symptoms, anxiety and depression.  Endocrine: Denied endocrine symptoms including hot flashes and night sweats.   Meds:   Current Outpatient Medications on File Prior to Visit  Medication Sig Dispense Refill  . acetaZOLAMIDE (DIAMOX) 500 MG capsule Take 500 mg by mouth 2 (two) times daily.    . bictegravir-emtricitabine-tenofovir AF (BIKTARVY) 50-200-25 MG TABS tablet Take by mouth daily.    . diclofenac sodium (VOLTAREN) 1 % GEL Apply 2 g topically 4 (four) times daily. 100 g 0  . diphenhydrAMINE (BENADRYL) 25 mg capsule Take 25 mg by mouth every 6 (six) hours as needed.    . Elastic Bandages & Supports (T.E.D. BELOW KNEE/MEDIUM) MISC 1 application by Does not apply route daily. 4 each 0  . naproxen (NAPROSYN) 500 MG tablet Take 500 mg by mouth 2 (two) times daily with a meal.    . ondansetron (ZOFRAN ODT) 4 MG disintegrating tablet Take 1 tablet (4 mg total) by mouth every  8  (eight) hours as needed for nausea or vomiting. 20 tablet 0  . promethazine (PHENERGAN) 25 MG tablet Take 25 mg by mouth every 6 (six) hours as needed for nausea or vomiting.    . SUMAtriptan (IMITREX) 50 MG tablet Take 50 mg by mouth every 2 (two) hours as needed for migraine. May repeat in 2 hours if headache persists or recurs.     No current facility-administered medications on file prior to visit.     Objective:     Vitals:   05/18/18 1050  BP: 115/80  Pulse: 94                Assessment:    [id number] Patient Active Problem List   Diagnosis Date Noted  . Abnormal mammogram 06/26/2014  . Adenopathy 02/11/2014  . Human immunodeficiency virus (HIV) infection ([place]) 02/10/2014  . Headache, migraine 12/13/2013  . Benign intracranial hypertension 12/13/2013     1. Pregnancy test positive     Patient without pelvic symptoms at this time.  Because of her history of ectopic pregnancy and ultrasound revealing no intrauterine or extrauterine pregnancy a search for the pregnancy and follow-up quantitative beta hCGs is warranted.   Plan:            1.  Quantitative beta-hCG today.  2.  Ultrasound Orders Orders Placed This Encounter  Procedures  . US OB Transvaginal  . Beta hCG quant (ref lab)    No orders of the defined types were placed in this encounter.     F/U  Return in about 2 weeks (around 06/01/2018). I spent 18 minutes involved in the care of this patient of which greater than 50% was spent discussing positive pregnancy test without intrauterine or extrauterine pregnancy, previous history of ectopic, work-up including quantitative beta hCG and ultrasounds for possible ectopic with management discussed.  All questions answered.  Finis Bud, M.D. 05/18/2018 11:20 AM

## 2018-05-18 NOTE — Progress Notes (Signed)
Pt states slight cramping.

## 2018-05-19 LAB — BETA HCG QUANT (REF LAB): HCG QUANT: 2848 m[IU]/mL

## 2018-05-21 ENCOUNTER — Other Ambulatory Visit: Payer: Self-pay | Admitting: *Deleted

## 2018-05-21 ENCOUNTER — Ambulatory Visit
Admission: RE | Admit: 2018-05-21 | Discharge: 2018-05-21 | Disposition: A | Discharge status: Home / Self Care | Payer: 59 | Source: Ambulatory Visit | Attending: Obstetrics and Gynecology | Admitting: Obstetrics and Gynecology

## 2018-05-21 ENCOUNTER — Other Ambulatory Visit: Payer: Self-pay | Admitting: Obstetrics and Gynecology

## 2018-05-21 ENCOUNTER — Telehealth: Payer: Self-pay

## 2018-05-21 ENCOUNTER — Telehealth: Payer: Self-pay | Admitting: *Deleted

## 2018-05-21 ENCOUNTER — Ambulatory Visit: Payer: 59

## 2018-05-21 DIAGNOSIS — Z3A01 Less than 8 weeks gestation of pregnancy: Secondary | ICD-10-CM | POA: Insufficient documentation

## 2018-05-21 DIAGNOSIS — Z3201 Encounter for pregnancy test, result positive: Secondary | ICD-10-CM

## 2018-05-21 DIAGNOSIS — O009 Unspecified ectopic pregnancy without intrauterine pregnancy: Secondary | ICD-10-CM | POA: Insufficient documentation

## 2018-05-21 DIAGNOSIS — N926 Irregular menstruation, unspecified: Secondary | ICD-10-CM

## 2018-05-21 DIAGNOSIS — Z21 Asymptomatic human immunodeficiency virus [HIV] infection status: Secondary | ICD-10-CM

## 2018-05-21 DIAGNOSIS — O3680X0 Pregnancy with inconclusive fetal viability, not applicable or unspecified: Secondary | ICD-10-CM

## 2018-05-21 LAB — CBC
HCT: 35.7 % (ref 35.0–47.0)
HEMOGLOBIN: 11.9 g/dL — AB (ref 12.0–16.0)
MCH: 29.6 pg (ref 26.0–34.0)
MCHC: 33.4 g/dL (ref 32.0–36.0)
MCV: 88.7 fL (ref 80.0–100.0)
Platelets: 220 10*3/uL (ref 150–440)
RBC: 4.03 MIL/uL (ref 3.80–5.20)
RDW: 15.6 % — ABNORMAL HIGH (ref 11.5–14.5)
WBC: 6.3 10*3/uL (ref 3.6–11.0)

## 2018-05-21 LAB — ABO/RH: ABO/RH(D): AB POS

## 2018-05-21 LAB — HCG, QUANTITATIVE, PREGNANCY: hCG, Beta Chain, Quant, S: 10524 m[IU]/mL — ABNORMAL HIGH (ref <5)

## 2018-05-21 LAB — ANTIBODY SCREEN: ANTIBODY SCREEN: NEGATIVE

## 2018-05-21 NOTE — Telephone Encounter (Signed)
Spoke with pt- Per DJE wants her to be seen sooner. [facility] scheduled u/s at [facility].

## 2018-05-21 NOTE — Telephone Encounter (Signed)
LMTRC  PER DJE- pt needs dating scan today.

## 2018-05-21 NOTE — Telephone Encounter (Signed)
Called Dr. Amalia Hailey to notify him of pts. HCG levels.  He said patient could keep scheduled appt. With [facility] which is in appx. 2 weeks.  I called patient and notified her of HCG level and to keep scheduled follow up appt.  Told pt. To call OB office for any concerns.

## 2018-05-22 LAB — PROGESTERONE: PROGESTERONE: 9.7 ng/mL

## 2018-05-23 NOTE — Unmapped (Signed)
Good Morning Irving Burton,    She has been added.    Thanks

## 2018-05-25 ENCOUNTER — Encounter: Admit: 2018-05-25 | Discharge: 2018-05-26 | Payer: BLUE CROSS/BLUE SHIELD

## 2018-05-25 DIAGNOSIS — O98719 Human immunodeficiency virus [HIV] disease complicating pregnancy, unspecified trimester: Principal | ICD-10-CM

## 2018-05-25 MED ORDER — RITONAVIR 100 MG TABLET
ORAL_TABLET | Freq: Every day | ORAL | 8 refills | 0.00000 days | Status: CP
Start: 2018-05-25 — End: 2018-06-26

## 2018-05-25 MED ORDER — ATAZANAVIR 300 MG CAPSULE
ORAL_CAPSULE | Freq: Every day | ORAL | 8 refills | 0 days | Status: CP
Start: 2018-05-25 — End: 2018-06-26

## 2018-05-25 MED ORDER — EMTRICITABINE 200 MG-TENOFOVIR DISOPROXIL FUMARATE 300 MG TABLET
ORAL_TABLET | Freq: Every day | ORAL | 8 refills | 0 days | Status: CP
Start: 2018-05-25 — End: 2018-06-26
  Filled 2018-06-05: qty 30, 30d supply, fill #0

## 2018-05-25 NOTE — Unmapped (Signed)
Assessment/Plan:         1. Maternal HIV infection during pregnancy    - Approx [redacted] weeks pregnant today, LMP 04/12/18  - Biktarvy counseling on insufficient data in pregnancy today.  - After lengthy discussion, patient chooses to change ART regimen  - After ensuring no DDI with current meds: Change to 3 tablet, once daily regimen: TDF/FTC + ATV/r (Truvada + atazanavir 300 + ritonavir): copay cards for all meds provided  - HIV RNA today, recheck in 1 month on new regimen.  - Will see MFM on 05/28/18.    HIV  Lab Results   Component Value Date    ACD4 1,696 (A) 05/01/2015    CD4 45 06/23/2014    HIVCP detected 10/23/2017    HIVRS Not Detected 04/18/2018       Sexual Health  Reviewed importance of condom use & offered condoms  Stressed importance of adherence and viral suppression to reduce transmission risks  Reviewed STD acquisition risks by sexual contact - including oral sex  STI screening today:    ?? GC/CT NAATs -- Declines, married, monogamous relationship  ?? RPR -- Declines, married, monogamous relationship  ?? Last RPR titer - No components found for: RPRTITR    Mental Health  - met with SW today    Adherence, Health Education  Adherence     General Education     Disclosure         Educational and counseling services took 30 minutes of today's visit in the area of:     Follow-up  Return to clinic in 4 weeks or sooner if needed.    - pt requests to see Dr. Rosemarie Beath for next visit and then will continue remainder of visits in pregnancy with myself    Subjective:    Morgan Hebert is a 32 y.o. female who presents to the Infectious Disease clinic for return/walk in HIV visit.     PCP:  No PCP Per Patient    Chief Complaint: No chief complaint on file.      HPI: In addition to details in A&P above:  Patient has been on Biktarvy with no missed doses since 02/2018. She takes medication every night at bedtime.    She states her LMP was 04/12/18 with regular cycles.  She has unprotected sex with her husband only. He is not on PrEP.   UPT positive x 2; presented to ER for u/s: showing gestational sac but could not rule ectopic pregnancy.  Patient then followed up with her OB in [place] on 05/21/18 where a repeat U/S showed intrauterine gestational sac.  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     Suffering from migraines; takes immitrex.   She has daily nausea from migraines at baseline for which she smokes MJ daily.    She has had URI x 2 days with cough and nasal congestion without fevers, chills.    Past Medical History:    Past Medical History:   Diagnosis Date   ??? Abnormal mammogram    ??? Constipation    ??? Diarrhea    ??? Environmental allergies    ??? HIV (human immunodeficiency virus infection) (CMS-HCC)    ??? HIV (human immunodeficiency virus infection) (CMS-HCC)    ??? IUD (intrauterine device) in place    ??? Migraine    ??? Obesity    ??? Pseudotumor cerebri    ??? STD (sexually transmitted disease)        Past Surgical History:  Past Surgical History:   Procedure Laterality Date   ??? LUMBAR PUNCTURE     ??? PR COLONOSCOPY W/BIOPSY SINGLE/MULTIPLE  07/03/2014    Procedure: COLONOSCOPY, FLEXIBLE, PROXIMAL TO SPLENIC FLEXURE; WITH BIOPSY, SINGLE OR MULTIPLE;  Surgeon: Billie Ruddy, MD;  [facility]: [facility];  Service: Gastroenterology   ??? PR UPPER GI ENDOSCOPY,BIOPSY N/A 07/03/2014    Procedure: UGI ENDOSCOPY; WITH BIOPSY, SINGLE OR MULTIPLE;  Surgeon: Billie Ruddy, MD;  Location: [facility];  Service: Gastroenterology       Current Medications:  Current Outpatient Medications   Medication Sig Dispense Refill   ??? diphenhydrAMINE (BENADRYL) 25 mg capsule Take 25 mg by mouth.     ??? promethazine (PHENERGAN) 25 MG tablet Take 1 tablet (25 mg total) by mouth every eight (8) hours as needed for nausea (Nausea or headache). 30 tablet 0   ??? SUMAtriptan (IMITREX) 50 MG tablet Take 1 tablet (50 mg total) by mouth every two (2) hours as needed for migraine (Maximum 2 doses/day & 4 doses/week). 9 tablet 0   ??? acetaZOLAMIDE (DIAMOX) 500 mg capsule Take 4 capsules (2,000 mg total) by mouth Two (2) times a day. (Patient not taking: Reported on 05/25/2018) 240 capsule 11   ??? atazanavir (REYATAZ) 300 MG capsule Take 1 capsule (300 mg total) by mouth daily. 30 capsule 8   ??? emtricitabine-tenofovir, TDF, (TRUVADA) 200-300 mg per tablet Take 1 tablet by mouth daily. 30 tablet 8   ??? furosemide (LASIX) 20 MG tablet Take 1 tablet (20 mg total) by mouth daily. for 12 days 12 tablet 0   ??? naproxen (NAPROSYN) 500 MG tablet Take 1 tablet (500 mg total) by mouth 2 (two) times a day with meals. (Patient not taking: Reported on 05/25/2018) 60 tablet 11   ??? riTONAvir (NORVIR) 100 mg tablet Take 1 tablet (100 mg total) by mouth daily. 30 tablet 8   ??? zonisamide (ZONEGRAN) 50 MG capsule Take 2 capsules (100 mg total) by mouth daily. (Patient not taking: Reported on 05/25/2018) 60 capsule 11     No current facility-administered medications for this visit.        Current Outpatient Medications on File Prior to Visit   Medication Sig Dispense Refill   ??? diphenhydrAMINE (BENADRYL) 25 mg capsule Take 25 mg by mouth.     ??? promethazine (PHENERGAN) 25 MG tablet Take 1 tablet (25 mg total) by mouth every eight (8) hours as needed for nausea (Nausea or headache). 30 tablet 0   ??? SUMAtriptan (IMITREX) 50 MG tablet Take 1 tablet (50 mg total) by mouth every two (2) hours as needed for migraine (Maximum 2 doses/day & 4 doses/week). 9 tablet 0   ??? acetaZOLAMIDE (DIAMOX) 500 mg capsule Take 4 capsules (2,000 mg total) by mouth Two (2) times a day. (Patient not taking: Reported on 05/25/2018) 240 capsule 11   ??? furosemide (LASIX) 20 MG tablet Take 1 tablet (20 mg total) by mouth daily. for 12 days 12 tablet 0   ??? naproxen (NAPROSYN) 500 MG tablet Take 1 tablet (500 mg total) by mouth 2 (two) times a day with meals. (Patient not taking: Reported on 05/25/2018) 60 tablet 11   ??? zonisamide (ZONEGRAN) 50 MG capsule Take 2 capsules (100 mg total) by mouth daily. (Patient not taking: Reported on 05/25/2018) 60 capsule 11     No current facility-administered medications on file prior to visit.        She  Allergies: Peanut    Social History:  General: Lives in [place] with her husband and 3 stepchildren and 1 child of hers own  General               Employment    Call center at [employer]        Sexual       Relationship    married to husband   Number partners       Oral sex   .      Anal sex   .   NO   Vaginal sex   .   Yes        Substance Use     Alcohol    prior to pregnancy   Tobacco    Denies   Marijuana    Daily    Illicits  Denies   IDU   Denies   Other    Review of Systems:  See HPI and below for ROS information - remainder of 10 systems negative   Constitutional  No F/C/S   Eyes  No blurred vision   ENT  No otalgia, nasal congestion, dysphagia, dental pain   Pulm  No cough or SOB   CV  No chest pain, palpitations   GI  No N/V/D/C   No abdominal pain.   GU  No dysuria, hematuria  No rectal symptoms   MSK  No myalgias or arthralgias   Psych  No depressed mood or anxiety.  No SI   Neuro  No dizziness or weakness   Skin  No rash       Objective:        BP 123/84  - Pulse 106  - Temp 37.1 ??C (98.8 ??F) (Oral)  - Ht 149.9 cm (4' 11)  - Wt 72.6 kg (160 lb 1.6 oz)  - LMP 04/12/2018  - Breastfeeding? No  - BMI 32.34 kg/m??     PHYSICAL EXAM:   GEN:   Well appearing. Alert and appropriate. NAD.   EYES:  EOMI, PERRL, non-injected,  anicteric  ENT:   MMM without oral lesions; TMs normal bilat; dentition   LYMPH:  No cervical or supra/infra clavicular adenopathy  RESP:  Normal respiratory effort, CTAB, good aeration   CV:   RRR, normal S1/S2, no m/r/g  ABD:   BS normal, Soft, NT/ND, no palable organomegaly   GU:   Deferred  RECTAL:  deferred  SKIN:   Warm and dry, no rash on clothed exam  EXTR:  No CCE.   MSK:  5/5 Strength no joint tenderness  NEURO: A&Ox3, CN grossly intact with no obvious focal deficits.   PSYCH:  Appropriate affect. Good eye contact.  Linear thoughts. Speech fluent    Recent Labs:    ------------------------------  Absolute CD4 Count   Date Value Status   05/01/2015 1,696 /uL (A) Final   10/24/2014 967 Final   06/23/2014 854 /uL Final     HIV RNA Quant Result (no units)   Date Value Status   04/18/2018 Not Detected Final   10/23/2017 42,400 Final   05/03/2017 Detected (A) Final   03/29/2017 not detected Final   03/01/2017 Not Detected Final   10/25/2016 detected Final     HIV RNA Log(10)   Date Value Status   05/03/2017  Final     Comment:     <1.6 log   12/02/2015  Final     Comment:     <1.6 log   02/11/2015  Final     Comment:     <1.6 log   03/25/2014 <1.60 /mL Final   02/13/2014 4.07 /mL Final      Lab Results   Component Value Date    WBC 5.4 03/28/2018    HGB 12.0 03/28/2018    PLT 255 03/28/2018    CREATININE 0.99 03/28/2018    AST 17 03/28/2018    ALT 16 03/28/2018    CTSOR CERVIX 02/13/2014    GCSOR CERVIX 02/13/2014    PAP Negative for intraephithelial lesion or malignancy 03/29/2017    TRIG 101 03/29/2017    HDL 40 03/29/2017    LDL 79 03/29/2017     HLA-B*57:01   Date Value Ref Range Status   02/13/2014 :  Final     Comment:     HLA-B*5701 is absent.  Laboratory Director: Carlisle Beers. Jordan Likes, Ph.D., D(ABHI, ABMLI)       Immunization History   Administered Date(s) Administered   ??? Influenza Vaccine Quad (IIV4 PF) 30mo+ injectable 06/30/2015, 06/15/2016         Sexual Health & Screening Data  RPR (no units)   Date Value Status   02/13/2014 NON-REACTIVE Final     Gonorrhoeae NAA (no units)   Date Value Status   03/01/2017 Negative Final   12/02/2015 Negative Final   02/11/2015 Negative Final   02/13/2014 NEGATIVE Final     GC Source (no units)   Date Value Status   02/13/2014 CERVIX Final     Chlamydia trachomatis, NAA (no units)   Date Value Status   03/01/2017 Negative Final   12/02/2015 Negative Final   02/11/2015 Negative Final   02/13/2014 NEGATIVE Final     C. Trach Source (no units)   Date Value Status   02/13/2014 CERVIX Final

## 2018-05-25 NOTE — Unmapped (Signed)
[facility] Specialty Medication Referral: No PA required    Medication (Brand/Generic): Reyataz, Truvada, Norvir    Initial Benefits Investigation Claim completed with resulted information below:  No PA required  Patient ABLE to fill at [facility]  Insurance Company:  [employer]  Anticipated Copay: $10 for each    As Co-pay is under $25 defined limit, per policy there will be no further investigation of need for financial assistance at this time unless patient requests. This referral has been communicated to the provider and handed off to the [facility] team for further processing and filling of prescribed medication.   ______________________________________________________________________  Please utilize this referral for viewing purposes as it will serve as the central location for all relevant documentation and updates.

## 2018-05-25 NOTE — Unmapped (Signed)
New to nurse completed with patient. History obtained and abstracted in Epic. Patient provided with important clinic numbers, scheduling line, nurse advice and after hours. Patient transferred to scheduling line and had appointments for [place] made. Pt scheduled with MFM MD on 8/21, has HIV and recurrent pregnancy loss.

## 2018-05-25 NOTE — Unmapped (Signed)
It was great to see you today.  We checked your labs today.  We will give you a call to share the results when they return.    We are changing your HIV medication from biktarvy to Truvada/Reyataz/Norvir.  This new regimen is three tablets once daily.  Continue taking biktarvy until you receive the new medications in the mail.  Stop taking biktarvy when you begin taking the new medications.    You will see Dr. Rosemarie Beath in one month to make sure you are tolerating the new medication regimen well and to check a viral load.      ?? The ID clinic phone number is [phone].  ?? The ID clinic fax number is [phone].  ?? Your nurse is Rayburn Ma. His direct phone number is [phone]    For urgent issues on nights and weekends you may reach the ID Physician on call through the [facility] Operator at [phone].     WALK-IN CLINIC  Please call ahead to speak with the nursing staff if you plan to come to walk-in clinic.   The walk-in hours are Monday, Tuesday and Thursday 8:30 am ??? 12:00 noon.      MEDICATIONS  For refills please contact your pharmacy and ask them to electronically send or fax the request to the clinic.   Please bring all medications in original bottles to every appointment.    ADAP or Juanell Fairly Eligibility (required even if meds are not filled through ADAP)  Please remember to renew your ADAP application during renewal periods which occur twice a year: January-March and July-September.     The following are needed for each renewal:   - [facility] Identification (if you don't have one, then a bill with your name and address in [place])   - proof of income (award letter, W-2, or last three check stubs)   If you are unable to come in for renewal, let us know if we can mail, fax or e-mail paperwork to you.   ADAP Contact: [phone].     Cruzita Lederer, FNP, [facility]  [facility]   [address] 1st floor   [place], [place]. [address]   [phone] (front desk)    Lab info:  Your most recent CD4 T-cell counts and viral loads are below. Here are a few things to keep in mind when looking at your numbers:  ?? For most people, we're checking CD4 counts every other visit (once or twice a year).  ?? It's normal for your CD4 count to be different from visit to visit.   ?? We consider your viral load to be undetectable if it says <40 or if it says Not detected.  ?? Our goal is to get your virus to be undetectable and keep it undetectable. If the virus is undetectable you are much more likely to stay healthy.  ?? You can help by taking your medications at about the same time, every single day. If you're having trouble with taking your medications, it's important to let us know.    Absolute CD4 Count   Date Value Ref Range Status   05/01/2015 1,696 (A) 359 - 1,519 /uL Final   10/24/2014 967 359 - 1,519 Final   06/23/2014 854 510 - 2,320 /uL Final   03/25/2014 749 510 - 2,320 /uL Final     HIV RNA   Date Value Ref Range Status   10/23/2017 detected  Final   05/03/2017 <40 (H) <0 copies/mL  Final   03/29/2017 <20  Final   10/25/2016 <20  Final   05/24/2016 <20  Final     HIV RNA Quant Result   Date Value Ref Range Status   04/18/2018 Not Detected Not Detected Final   10/23/2017 42,400  Final   05/03/2017 Detected (A) Not Detected Final   03/29/2017 not detected  Final   03/01/2017 Not Detected Not Detected Final   10/25/2016 detected  Final   05/24/2016 Detected  Final

## 2018-05-25 NOTE — Unmapped (Signed)
Duration of Intervention: 15 minutes    REASON/TYPE OF CONTACT: SW met with pt at medical visit.     ASSESSMENT: Pt doing well. Works at [employer] and has 32 year old son. Husband has three other children and is excited she is pregnant. Pt is anxious about another miscarriage. Interested in [facility] with this SW so enrolled today.     Texting Agreement __x_ Agreed   ____ Refused : Expiration  05/26/2023    SAMISS: Pt denied use of nicotine products. Pt denied use of alcohol. Pt reported daily use of marijuana for nausea with no intent to quit. Pt denied all other drug use. Pt denied legal issues with drugs/alcohol. Pt denies hx of dx or rx for MH. Pt denies feeling sad, depressed, down for two weeks or longer the past year. Pt denies feeling worried or anxious for a month or longer in the past year but does report feeling anxious now as she has had many miscarriage in the past. Pt denies hx of panic/anxiety attacks. Pt denies auditory/visual hallucinations. Pt denies hx of trauma and nightmares/flashbacks. Pt denies hx of SA tx and MH tx.        05/25/18 1500   PHQ-9: Over the last 2 weeks, how often have you been bothered by any of the following problems?   Reported by Patient   Little interest or pleasure in doing things 0   Feeling down, depressed, or hopeless; (age 49-17) Feeling down, depressed, irritable, or hopeless 0   Trouble falling or staying asleep, or sleeping too much 1   Feeling tired or having little energy 1   Poor appetite or overeating; (age 24-17) Poor appetite, weight loss or overeating 1   Feeling bad about yourself - or that you are a failure or have let yourself or your family down; (age 50-17) Feeling bad about yourself - or feeling that you are a failure, or that you have let yourself or your family down 0   Trouble concentrating on things, such as reading the newspaper or watching television; (age 61-17) Trouble concentrating on things like schoolwork, reading or watching TV 1   Moving or speaking so slowly that other people could have noticed. Or the opposite - being so fidgety or restless that you have been moving around a lot more than usual 0   Thoughts that you would be better off dead, or of hurting yourself in some way 0   PHQ-9 TOTAL SCORE 4   PHQ-9 Total Score Depression Severity:   PHQ-9 Total Score Depression Severity: None-Minimal        05/25/18 1500     Over the last 2 weeks, how often have you been bothered by the following problems?   Feeling nervous, anxious or on edge 0   Not being able to stop or control worrying 1   Worrying too much about different things 1   Trouble relaxing 1   Being so restless that it is hard to sit still 1   Becoming easily annoyed or irritable 1   Feeling afraid as if something awful might happen 1   Clinic Collected GAD-7 Total Score 6   How difficult have these problems made it for you to do your work, take care of things at home, or get along with other people? Not at all difficult   GAD-7 Total Score Anxiety Severity:   GAD-7 Total Score Anxiety Severity: Mild anxiety       ADHERENCE: Pt reports good adherence and  was counseled on new regimen.     INTERVENTION:?? SW provided psychosocial support and care coordination.     PLAN:?? SW will f/u with pt next week re: medication access.      ID CARE PLAN ??? PREGNANCY HEALTH MAINTENANCE     Need: Continued supportive health maintenance during pregnancy.      Short Term Goal/Objective:    1. Continue to support patient's medication adherence    2. Continue to support patient's medical appointment adherence    3. Develop plan to address barriers and need for resources as they arise by contacting social worker  4. Continue to engage with financial counselors to assist in ensuring eligibility of Juanell Fairly and ADAP funding if applicable.     Person Responsible for Action: patient, Child psychotherapist, provider, financial counselors     Intervention/Activities/Actions:    1. Social Worker will make contact with patient at medical appointments.   2. Patient will reach out to social worker with questions about adherence as needed.   3. Social worker will provide support regarding medication adherence, pregnancy, risk reduction, or other counseling as needed.    4. Social Worker will provide reinforcement of information as needed during clinic visits or by phone through calls.     Outcome:  1. Patient asks questions/seeks information to reinforce or expand understanding of HIV and pregnancy.    2. Document evidence in EPIC that patient has either attended next scheduled ID Clinic appointment or has called to reschedule  3. Patient takes prescribed medication on a daily basis as reported to SW or patient's medical provider.      Bradly Bienenstock LCSWA, CHES

## 2018-05-28 NOTE — Unmapped (Signed)
Duration of Intervention: 15 minutes    Pt texted [facility] cell phone last night stating she has brown discharge and is wondering if this is normal. SW called provider [place] for support who stated this may be normal but pt should call the OB nurse line. SW inquired if pt has their phone number. Pt stated she did but she called her sister who was a nurse who told her that this was normal.     SW informed pt to call nurse line with any addt sx and that our OB may call her this afternoon.     Bradly Bienenstock LCSWA, CHES

## 2018-05-28 NOTE — Unmapped (Signed)
Patient texted SW with complaint of brown spotting.  Attempted to reach patient to further assess situation but no answer.   Patient was given information to [facility] Nurse Triage and she can use this resource.  Spoke with Dr. Vladimir Crofts who was also notified of the situation but has not met the patient yet and patient was already scheduled for ultrasound for this Wednesday 05/30/18.

## 2018-05-29 LAB — HIV RNA, QUANTITATIVE, PCR: HIV RNA QNT RSLT: NOT DETECTED

## 2018-05-29 LAB — HIV RNA LOG(10): Lab: 0

## 2018-05-29 NOTE — Unmapped (Signed)
Opened encounter in error  

## 2018-05-30 ENCOUNTER — Encounter: Admit: 2018-05-30 | Discharge: 2018-05-31 | Payer: BLUE CROSS/BLUE SHIELD

## 2018-05-30 ENCOUNTER — Ambulatory Visit: Admit: 2018-05-30 | Discharge: 2018-05-31 | Payer: BLUE CROSS/BLUE SHIELD

## 2018-05-30 ENCOUNTER — Encounter
Admit: 2018-05-30 | Discharge: 2018-05-31 | Payer: BLUE CROSS/BLUE SHIELD | Attending: Maternal & Fetal Medicine | Primary: Maternal & Fetal Medicine

## 2018-05-30 DIAGNOSIS — B20 Human immunodeficiency virus [HIV] disease: Secondary | ICD-10-CM

## 2018-05-30 DIAGNOSIS — O2621 Pregnancy care for patient with recurrent pregnancy loss, first trimester: Secondary | ICD-10-CM

## 2018-05-30 DIAGNOSIS — G932 Benign intracranial hypertension: Secondary | ICD-10-CM

## 2018-05-30 DIAGNOSIS — O3680X0 Pregnancy with inconclusive fetal viability, not applicable or unspecified: Secondary | ICD-10-CM

## 2018-05-30 DIAGNOSIS — Z3490 Encounter for supervision of normal pregnancy, unspecified, unspecified trimester: Principal | ICD-10-CM

## 2018-05-30 DIAGNOSIS — M5481 Occipital neuralgia: Principal | ICD-10-CM

## 2018-05-30 DIAGNOSIS — O0990 Supervision of high risk pregnancy, unspecified, unspecified trimester: Secondary | ICD-10-CM

## 2018-05-30 DIAGNOSIS — O200 Threatened abortion: Secondary | ICD-10-CM

## 2018-05-30 LAB — CBC W/ AUTO DIFF
BASOPHILS ABSOLUTE COUNT: 0 10*9/L (ref 0.0–0.1)
BASOPHILS RELATIVE PERCENT: 0.6 %
EOSINOPHILS RELATIVE PERCENT: 2.7 %
HEMATOCRIT: 40.2 % (ref 36.0–46.0)
HEMOGLOBIN: 12.5 g/dL (ref 12.0–16.0)
LARGE UNSTAINED CELLS: 1 % (ref 0–4)
LYMPHOCYTES ABSOLUTE COUNT: 2.7 10*9/L (ref 1.5–5.0)
LYMPHOCYTES RELATIVE PERCENT: 43.1 %
MEAN CORPUSCULAR HEMOGLOBIN CONC: 30.9 g/dL — ABNORMAL LOW (ref 31.0–37.0)
MEAN CORPUSCULAR HEMOGLOBIN: 28.3 pg (ref 26.0–34.0)
MEAN CORPUSCULAR VOLUME: 91.5 fL (ref 80.0–100.0)
MEAN PLATELET VOLUME: 7.8 fL (ref 7.0–10.0)
MONOCYTES ABSOLUTE COUNT: 0.3 10*9/L (ref 0.2–0.8)
MONOCYTES RELATIVE PERCENT: 4.6 %
NEUTROPHILS ABSOLUTE COUNT: 3 10*9/L (ref 2.0–7.5)
NEUTROPHILS RELATIVE PERCENT: 47.8 %
PLATELET COUNT: 321 10*9/L (ref 150–440)
RED CELL DISTRIBUTION WIDTH: 15.1 % — ABNORMAL HIGH (ref 12.0–15.0)
WBC ADJUSTED: 6.3 10*9/L (ref 4.5–11.0)

## 2018-05-30 LAB — SMEAR REVIEW

## 2018-05-30 LAB — MEAN CORPUSCULAR HEMOGLOBIN CONC: Lab: 30.9 — ABNORMAL LOW

## 2018-05-30 LAB — HEPATITIS B SURFACE ANTIGEN: Hepatitis B virus surface Ag:PrThr:Pt:Ser:Ord:: NONREACTIVE

## 2018-05-30 MED ORDER — PROMETHAZINE 25 MG TABLET
ORAL_TABLET | Freq: Three times a day (TID) | ORAL | 3 refills | 0 days | Status: CP | PRN
Start: 2018-05-30 — End: 2018-09-18

## 2018-05-30 NOTE — Unmapped (Signed)
Call Center Message, 05/30/18, 10:07 AM    Dr. Bing Matter:    Gabriel Earing from [facility] called to obtain refills for Morgan Hebert, MR# [id number], for Promethazine 25 mg and Sumatriptan 50 mg.  Please advise.  Phone:  [phone]

## 2018-05-30 NOTE — Unmapped (Addendum)
[facility]                                                                         New Prenatal Note  History present of Illness   [name] is an 32 y.o. [id number] at [redacted]w[redacted]d with Estimated Date of Delivery: 01/17/19, who is seen in for a new OB visit. Her medical history is notable for HIV maintained on ART, pseudotumor cerebri and recurrent pregnancy loss. She denies vaginal bleeding, loss of fluid, or abdominal pain. ROS neg.     Obstetrical History  OB History   Gravida Para Term Preterm AB Living   9 1 1   7 1    SAB TAB Ectopic Molar Multiple Live Births   6   1     1       # Outcome Date GA Lbr Len/2nd Weight Sex Delivery Anes PTL Lv   9 Current            8 SAB 2013           7 Ectopic 2013           6 SAB 2012           5 SAB 2012           4 SAB 2011           3 SAB 2010           2 SAB 2010           1 Term 06/28/06   2693 g (5 lb 15 oz) F Vag-Spont  N LIV   Chemical pregnancy x7 with no pregnancy visualized on [place]     Gynecologic History:   No hx of abnormal paps, last pap smear NILM in 03/2018 per patient report. Denies hx of sexually transmitted illnesses in particular herpes simplex virus.    Past Medical History  Past Medical History:   Diagnosis Date   ??? Abnormal mammogram    ??? Constipation    ??? Diarrhea    ??? Environmental allergies    ??? HIV (human immunodeficiency virus infection) (CMS-HCC)    ??? HIV (human immunodeficiency virus infection) (CMS-HCC)    ??? IUD (intrauterine device) in place    ??? Migraine    ??? Obesity    ??? Pseudotumor cerebri    ??? STD (sexually transmitted disease)        Past Surgical History  Past Surgical History:   Procedure Laterality Date   ??? LUMBAR PUNCTURE     ??? PR COLONOSCOPY W/BIOPSY SINGLE/MULTIPLE  07/03/2014    Procedure: COLONOSCOPY, FLEXIBLE, PROXIMAL TO SPLENIC FLEXURE; WITH BIOPSY, SINGLE OR MULTIPLE;  Surgeon: Billie Ruddy, MD;  Location: [facility];  Service: Gastroenterology ??? PR UPPER GI ENDOSCOPY,BIOPSY N/A 07/03/2014    Procedure: UGI ENDOSCOPY; WITH BIOPSY, SINGLE OR MULTIPLE;  Surgeon: Billie Ruddy, MD;  Location: [facility];  Service: Gastroenterology       Social History  Social History     Socioeconomic History   ??? Marital status: Married     Spouse name: Not on file   ??? Number of children: Not on file   ??? Years of education:  Not on file   ??? Highest education level: Not on file   Occupational History   ??? Not on file   Social Needs   ??? Financial resource strain: Not on file   ??? Food insecurity:     Worry: Not on file     Inability: Not on file   ??? Transportation needs:     Medical: Not on file     Non-medical: Not on file   Tobacco Use   ??? Smoking status: Former Smoker     Types: Cigarettes   ??? Smokeless tobacco: Never Used   Substance and Sexual Activity   ??? Alcohol use: Not Currently     Alcohol/week: 0.0 standard drinks     Comment: rare   ??? Drug use: Not Currently     Types: Marijuana   ??? Sexual activity: Not Currently     Partners: Male   Lifestyle   ??? Physical activity:     Days per week: Not on file     Minutes per session: Not on file   ??? Stress: Not on file   Relationships   ??? Social connections:     Talks on phone: Not on file     Gets together: Not on file     Attends religious service: Not on file     Active member of club or organization: Not on file     Attends meetings of clubs or organizations: Not on file     Relationship status: Not on file   Other Topics Concern   ??? Not on file   Social History Narrative    Patient lives in [place], [place] with her fiance and daughter.  She is not employed.       Medications  Outpatient Medications Marked as Taking for the 05/30/18 encounter (Initial Prenatal) with Greer Ee, MD   Medication Sig Dispense Refill   ??? diphenhydrAMINE (BENADRYL) 25 mg capsule Take 25 mg by mouth.     ??? promethazine (PHENERGAN) 25 MG tablet Take 1 tablet (25 mg total) by mouth every eight (8) hours as needed for nausea (Nausea or headache). 30 tablet 0   ??? SUMAtriptan (IMITREX) 50 MG tablet Take 1 tablet (50 mg total) by mouth every two (2) hours as needed for migraine (Maximum 2 doses/day & 4 doses/week). 9 tablet 0       Allergies  Peanut    Review of Systems  A complete ROS was performed with pertinent positives/negatives noted in the HPI. All other systems reviewed were negative.    Objective  Temp: 36.7 ??C  Heart Rate: 79  BP: 112/74  Urine Glucose: Negative  Urine Protein: Negative  Loss of Fluid: No  Bleeding: No  Contractions: Irregular    General Appearance No acute distress, well appearing, and well nourished.   Pulmonary Normal work of breathing.   Cardiovascular Normal rate.  Edema none.      Gastronintestinal Abdomen soft, gravid, no rebound or guarding.   Genitourinary Uterus:Gravid; nontender   Muskuloskeletal Normal gait, grossly normal range of motion   Neurologic Alert and oriented to person, place, and time   Psychiatric  Integumentary Mood and affect within normal limits  Skin is warm, dry, no rash noted   Imaging  MFM [place]: +SIUP with GS and YS c/w [redacted]w[redacted]d. No cardiac activity visualized.     Laboratory results from this encounter  Results for orders placed or performed in visit on 05/30/18   POCT Urinalysis Dipstick  Result Value Ref Range    Spec Gravity/POC 1.015 1.003 - 1.030    PH/POC 6.5 5.0 - 9.0    Leuk Esterase/POC Negative Negative    Nitrite/POC Negative Negative    Protein/POC Negative Negative    UA Glucose/POC Negative Negative    Ketones, POC Negative Negative    Bilirubin/POC Negative Negative    Blood/POC Negative Negative    Urobilinogen/POC 0.2 0.2 - 1.0 mg/dL       Results in Past 782 Days  Result Component Current Result   ABO Grouping AB POS (05/30/2018)   Antibody Screen NEG (05/30/2018)       No results found for requested labs within last 360 days.       Assessment and Plan:  Morgan Hebert is a 32 y.o. [phone] at [redacted]w[redacted]d by LMP who is seen to establish prenatal care.     1. HIV  Patient was diagnosed with HIV in 2015. She is closely followed by ID and is maintained on Biktarvy. Viral load was undectable on 8/16. We discussed the risk of vertical transmission from mom to infant of up to 25% with no intervention. Tthis risk can be as low at 1% with antiretrovirals and an undetectable viral load. Use of ARVs in pregnancy therefore outweighs risk of medications. Morgan Hebert has not been well-studied in pregnancy. However, since the patient is already taking this medication, ease of once a day administration, minimal side effects, the benefit to both mother and infant should be considered. We discussed mode of delivery - that NSVD is possible if viral load is less than 1000. We discussed that breastfeeding is not recommended for mothers with HIV. We can provide support on this issue if needed. We discussed need for infant prophylaxis and lab work after delivery.    Recommendations:   - Continue taking Biktarvy   - Follow up as scheduled with ID   - Targeted anatomic [place] at 18-20 weeks   - qtrimester VL and CD4       2. Pseudotumor cerebri   We discussed that treatment of pseudotumor cerebri in pregnancy is largely based on expert opinion. Case series and retrospective studies (Digre, et al, 1984; Huna-Baron & Kupersmith, 2002; Tang, et. Al, 2004) suggest that the disease course is not affected by pregnancy and that pregnancy course is not altered by the disease. Therefore treatment should be per routine recommendations with consideration for any potential medication/imaging exposures which may be detrimental to pregnancy with goals being treatment of headaches and preservation of vision. Interventions such as lumbar punctures or surgery may be performed as deemed necessary.     The patient's pre-pregnancy regimen included Diamox, Ibuprofen, Promethazine and Imitrex. The patient stopped taking Diamox and Ibuprofen when she decided to have her IUD removed. Her current regimen includes Tylenol, Phenergan and Imitrex.  While Imitrex, a selective serotonin agonist, is not well-studied in pregnancy, limited pregnancy outcome data is reassuring. In addition, animal studies have not demonstrated an increased risk of congenital anomalies. Given the significant benefit the patient derives from this medication, I believe the risk benefit ratio for both the patient and the fetus favors continuing Imitrex.      Recommendations:  1. Limit weight gain to the recommended ADA guidelines or a max of 20 lbs; rapid weight increase has been associated with exacerbations of pseudotumor cerebri.  2. If MRI imaging is to be performed, avoid contrast unless necessary.  3. Medications: Current regimen Tylenol, Phenergan, Imitrex prn    A.  Diamox is a first line treatment with minimal information regarding pregnancy exposure. However, it does not appear to be associated with teratogenicity in human pregnancies, and may be used cautiously if necessary and with patient informed consent; preferably after the first trimester. We discussed that if the patient has worsening symptoms, can consider restarting Diamox at her next visit.   B. Topiramate has been used as a treatment for pseudotumor cerebri; data in this setting is limited and its use is not recommended.   C. Steroids are used for urgent treatment in pseudotumor cerebri and are  generally considered safe in pregnancy.   4. Mode of delivery should be per routine obstetric care.  5. Intrapartum anesthesia can be per routine obstetric care.   6. Headache treatment should take into consideration medication effects on pregnancy and should be individualized per patient.    7. Check visual fields clinically every month with rapid referral to ophthalmology if deficits are detected.  8. Consider Anesthesia consultation- neuraxial anesthesia can be beneficial in these patients.      3. Recurrent pregnancy loss  The patient reports six early pregnancy losses where she had a positive pregnancy test followed by menorrhagia. These early pregnancies were not confirmed on ultrasound. She denies an evaluation for recurrent pregnancy loss. We reviewed the potential etiologies of recurrent pregnancy loss including maternal disease (APAS, SLE, diabetes, obesity), infection (TORCH infections such as CMV, parvo, coxsackie virus, syphilis), uterine anomalies, IUGR, placental or cord accidents, genetic abnormalities (aneuploidy, single gene disorders), luteal phase abnormality, cervical insuffiency, or unexplained.     The patient underwent a viability [place] today demonstrating a gestational sac, fetal pole and no cardiac activity. The patient has been scheduled for a follow up viability scan in two weeks. We discussed proceeding with maternal serum testing for APLS and maternal karyotype pending her follow up US.       4. Maternal wellbeing   Anticipatory guidance reviewed. Specifically we discussed: foods safe to eat during pregnancy, including limiting fish high in mercury to once per week, heating deli meat and avoiding unpasteurized cheeses; not changing cat litter; and wearing gloves when gardening. We discussed that on average, women should eat 300 extra calories per day, and reviewed appropriate weight gaining guidelines based on the recent IOM report. We discussed how our MFM practice works, delivery at [facility] in [place], and the role of residents and students.    Recommendations:  - Follow up viability [place] is scheduled for 9/4   - Initial PNL (o)   - Desires aneuploidy screening; referral to genetic counseling    She will follow up in 4 weeks in clinic, 2 weeks for [place].    I spent 60 minutes in face-to-face communication with [name], greater than 50% of which was in counseling and education as documented above.       This patient was discussed in detail with Dr. Winn Sever, who is in agreement with the above recommendations.    Hendricks Limes Val Riles, MD

## 2018-05-30 NOTE — Unmapped (Signed)
Duration of Intervention: 15 minutes    SW met with pt in clinic following OB visits. Pt stated everything looked good in the ultrasound and medication was going well. Pt is to been seen in two weeks.     SW provided pt with one [employer]WD all day parking pass.     Bradly Bienenstock LCSWA, CHES

## 2018-05-30 NOTE — Unmapped (Signed)
Book Caring for You and Your Baby given to patient and reviewed how to contact Clinic and Nurse triage on page 2. Reviewed with patient page 3, Contacting Your Provider.  Reviewed with patient Table of Contents with specific instruction to review Common Concerns and Recommended Treatment on page 12 to be aware of over the counter medications and treatments safe in pregnancy.

## 2018-05-30 NOTE — Unmapped (Signed)
Neurology Follow-up Visit Note     [facility] [address]  [facility] [place] [address] [place]  [address]  [place] [address]  [phone]    Date: 05/30/2018   Patient Name: Morgan Hebert   MRN: [id number]   PCP: No PCP Per Patient  Referring Provider: Tona Sensing*       Assessment and Plan          Morgan Hebert is a 32 y.o. female with a past medical history of pseudotumor cerebri, multifactorial headaches, chronic GI complaints (possibly of functional nature), and HIV (on Biktarvy, VL 240 in June 2019) presenting for follow-up of her multifactorial headaches and pseudotumor cerebri.     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 Patient learned that she was pregnant on August 6th, and discontinued both Zonisamide and Diamox soon thereafter on the advice of her OBGYN (who is OK with Diamox being restarted in the second trimester). Since then her usual headache have become more severe in nature due to the contraindication of her usual abortive medication, ibuprofen. She gets some relief from both her sumatriptan and promethazine, but is limited in the use of the former to 4 doses per week and has a limited supply of the latter, which she saves for bedtime to be able to sleep. She denies changes in her vision, visual fields or visual acuity. Exam significant for stable bilateral Grade I-II papilledema and visual fields fully intact to confrontation (though these will need formal evaluation by ophthalmology in the absence of her usual IIH therapies). Bilateral occipital nerve block with lidocaine and solumedrol performed today in clinic, and promethazine re-filled.  - Hold Diamox until 2nd trimester per OBYGN  - S/P bilateral occipital nerve block 05/30/2018   - Can use Tylenol 1g q8h PRN, promethazine 25mg  q8h PRN, and sumatriptan 50mg  q2h PRN as abortives  - Needs formal evaluation of retinal fields and papilledema in the absence of Diamox; will check up on prior referral to [facility] or consider re-referral to [facility] in interim  [facility] follow-up in 3-6 months.     Reviewed the differential diagnosis, plan of care in detail, as well as other elements as detailed above.  A written summary was provided to the patient, including a current medication and allergy list, problem list and plan of care. In addition, my contact information was provided to the patient, in writing.  All the patient's questions and concerns were addressed to their stated satisfaction .      Return Visit Discussed: No follow-ups on file.    Greater than 50% of today's visit lasting greater than 25 minutes ([id number]) was dedicated to face-to-face counseling on the points above.    This patient was seen and discussed with Dr. Raenette Rover who agrees with the above assessment and plan.       Mindi Curling, MBBS  PGY-2 Neurology    *Patient note was created using Dragon Dictation.  Any errors in syntax or even information may not have been identified and edited on initial review prior to signing this note.    ATTESTATION NOTE:  I discussed the patient's case with the resident. I reviewed the resident???s note and agree with the  resident???s findings and plan as documented in their note.     Jamelle Rushing, MD  Assistant Professor  Department of Neurology  [place] [facility]         HPI         HPI: Morgan Hebert is a 32 y.o. female with a past medical history of pseudotumor cerebri, multifactorial headaches, chronic GI complaints (possibly of functional nature), and HIV (on Biktarvy, VL 240 in June 2019) presenting for follow-up of her multifactorial headaches and pseudotumor cerebri. Patient was last seen by [facility] on 05/02/2018.        To review their relevant history, To review her relevant headache history, she initially presented to [facility] ED in 11/2013 with 4 months of headache which were described as initially intermittent with increase in frequency and later becoming continuous. Headaches were right-sided and at times associated with photophobia, phonophobia, nausea with occasional emesis, vertigo, and right ear tinnitus. She also complained of bilateral blurry vision and double vision. She had not taken oral OCPs, vitamin A, or tetracyclines. Fundoscopic examination demonstrated mild disc edema bilaterally. She underwent MRI brain which showed no abnormalities and lumbar puncture which showed normal CSF and elevated opening pressure of 42 cm H2O. ??She was started on acetazolamide 500 mg twice daily for pseudotumor cerebri; later this was increased to 1000 mg bid for improved symptoms control and improved papilledema. She later became asymptomatic allowing for the discontinuation of acetazolamide 1000 mg twice daily. In 01/2014 due to continued migraines, she was started on prophylaxis with propranolol, which has subsequently been discontinued due to hypotension. ??In 2016, she endorsed a 30 pound weight gain accompanied by increased frequency of headache. ??Her headache characteristics had changed at that time with a near continuous global headache upon which was superimposed periods of daily severe pressure behind her eyes and in her neck and shoulders. ??At that time she was using promethazine and naproxen for daily abortive treatment with success. ??In early 2017, she was restarted on Diamox up to 1500 mg twice daily but was receiving infrequent ophthalmologic care. Fortunately she was seen on 12/01/2015 and found to have bilateral retinal thinning temporally as well as decreased visual fields on testing in the OS nasal field. Repeat exam in April 2017 demonstrated some improvement of her OS visual fields.  ??  Since that time, patient has intermittently been seen in the emergency room for headache and has been tried on several medications prescribed by other providers. ??This has included desipramine (caused significant sedation),??indomethacin (caused significant abdominal pain), and amitriptyline (cause sedation). ??Of note, the TCAs were started in the setting of concurrent Phenergan and Bentyl use which may have worsened the sedating side effects. ??She is also been evaluated with CPAP in 2017 which did not demonstrate any OSA. ??At that time her husband reported snoring without periods of apnea. ??This is been stable per him. 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 ??However in the past, her ophthalmology exams??have been stable to improving. ??We have discussed several times that surgical intervention may not alter her headache syndrome. ??Rather this is designed to preserve her vision.  ??  In late March 2019, the patient reports that she was seen by a local optometrist who reported to her that she had high pressure in her eyes. ??At that time she had been on Diamox 1000 mg twice daily. ??Over the phone, high increase this to 1500 mg twice daily  and recommend that she get an LP in the emergency department. ??Unfortunately, the LP was unsuccessful and caused back pain. ??She reports that since earlier this year, she has had nearly daily headache that is described as starting in the posterior region and becoming global. ??There is then an intense pressure behind her eyes. ??She reports some mild photophobia and phonophobia as well as intermittent nausea and vomiting. ??She also endorses some mild to moderate tinnitus. ??Since her last evaluation, she has had significant changes in her medication regimen. ??She is no longer taking her GI cocktails. ??She is not taking any migraine prophylaxis. ??She takes ibuprofen 800 mg 3-4 times daily with mild to moderate headache relief. ??She is not followed regularly by an ophthalmologist. ??She does report losing a few pounds in the last few weeks as she is watching her diet more carefully. ??Her current medication regimen includes her Diamox 1500 mg twice daily, her HIV antiviral, ibuprofen as above, and Benadryl 25 mg nightly for allergies. ??She takes this year-round. ??She denies any change in her vision but reports that the optometrist gave her a stronger prescription. ??She continues to endorse good follow-up with her infectious disease provider. ??She is tolerating the Diamox well with mild tingling paresthesias which she has gotten used to..    As of July 2019 she reports no improvement, and possibly some worsening, in frequency and intensity of her chronic daily headache since last in neurology clinic in May 2019, at which point she was initiated on zonisamide 50mg  qd and uptitrated to Diamox 2g BID. She was also referred to [facility] ophthalmology for assessment of her visual fields and retinal testing, but was never contacted by the clinic. She describes daily headaches, worsened by heat, light, and certain smells that originate at the back of her neck and radiate over the top of her head bilaterally to her eyes, at which point she may experience associated ringing in her ears, nausea & vomiting, double vision, photophobia, and phonophobia. She feels these headache range from 5-9/10 in intensity from day to day, , R > L, and are characterized by a continuous, sharp pain that she finds debilitating on approximately half of all days. She has declined to use abortive medications like Imitrex and promethazine due to their sedating effects, and she prefers alternating between ibuprofen and naproxen, which both give her partial relief. Of note, the nausea & vomiting which she describes in the context of her headaches has limited her appetite, and she reported losing approximately 20 pounds since her last clinic visit in May, with associated improvement in her snoring. Given a component of occipital neuralgia contributing to her multifactorial headache, planned for trial of 2-3 week trial of cold compresses to bilateral occipital grooves for BID.    As of 05/11/2018 she reports a worsening of her chronic daily headache to a continuous pain that limits her ability to work 2-3 days each week. She reports good compliance with the planned trial of cold compresses to her occipital nerve grooves anf her zonisamide 50mg  qd. She is only using ibuprofen & naproxen as abortive medications. Her pain still originates in her posterior shoulder girdle, radiating over her head and behind her eyes to create a throbbing pain associated with nausea, vomiting, intermittent blurring of vision & double vision, phonophobia, and photophobia. She feels this headache is slightly increased in its continuity and intensity from last week, prompting her to return to clinic after such a short interval.    On 05/30/2018 she reports  learning that she was pregnant on August 6th, and discontinued both Zonisamide and Diamox soon thereafter on the advice of her OBGYN (who is OK with Diamox being restarted in the second trimester). She is [redacted] weeks pregnant as of 05/31/2018. Since then her usual headache have become more severe in nature due to the contraindication of her usual abortive medication, ibuprofen. She gets some relief from both her sumatriptan and promethazine, but is limited in the use of the former to 4 doses per week and has a limited supply of the latter, which she saves for bedtime to be able to sleep. She denies changes in her vision, visual fields or visual acuity.    Allergies   Allergen Reactions   ??? 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        Current Outpatient Medications   Medication Sig Dispense Refill   ??? diphenhydrAMINE (BENADRYL) 25 mg capsule Take 25 mg by mouth.     ??? promethazine (PHENERGAN) 25 MG tablet Take 1 tablet (25 mg total) by mouth every eight (8) hours as needed for nausea (Nausea or headache). 90 tablet 3   ??? SUMAtriptan (IMITREX) 50 MG tablet Take 1 tablet (50 mg total) by mouth every two (2) hours as needed for migraine (Maximum 2 doses/day & 4 doses/week). 9 tablet 0   ??? atazanavir (REYATAZ) 300 MG capsule Take 1 capsule (300 mg total) by mouth daily. (Patient not taking: Reported on 05/30/2018) 30 capsule 8   ??? emtricitabine-tenofovir, TDF, (TRUVADA) 200-300 mg per tablet Take 1 tablet by mouth daily. (Patient not taking: Reported on 05/30/2018) 30 tablet 8   ??? furosemide (LASIX) 20 MG tablet Take 1 tablet (20 mg total) by mouth daily. for 12 days 12 tablet 0   ??? naproxen (NAPROSYN) 500 MG tablet Take 1 tablet (500 mg total) by mouth 2 (two) times a day with meals. (Patient not taking: Reported on 05/25/2018) 60 tablet 11   ??? riTONAvir (NORVIR) 100 mg tablet Take 1 tablet (100 mg total) by mouth daily. (Patient not taking: Reported on 05/30/2018) 30 tablet 8     No current facility-administered medications for this visit.        Past Medical History:   Diagnosis Date   ??? Abnormal mammogram    ??? Constipation    ??? Diarrhea    ??? Environmental allergies    ??? HIV (human immunodeficiency virus infection) (CMS-HCC)    ??? HIV (human immunodeficiency virus infection) (CMS-HCC)    ??? IUD (intrauterine device) in place    ??? Migraine    ??? Obesity    ??? Pseudotumor cerebri    ??? STD (sexually transmitted disease)        Past Surgical History:   Procedure Laterality Date   ??? LUMBAR PUNCTURE     ??? PR COLONOSCOPY W/BIOPSY SINGLE/MULTIPLE  07/03/2014    Procedure: COLONOSCOPY, FLEXIBLE, PROXIMAL TO SPLENIC FLEXURE; WITH BIOPSY, SINGLE OR MULTIPLE;  Surgeon: Billie Ruddy, MD;  [facility];  Service: Gastroenterology   ??? PR UPPER GI ENDOSCOPY,BIOPSY N/A 07/03/2014    Procedure: UGI ENDOSCOPY; WITH BIOPSY, SINGLE OR MULTIPLE; Surgeon: Billie Ruddy, MD;  Location: [facility];  Service: Gastroenterology       Social History     Socioeconomic History   ??? Marital status: Married     Spouse name: None   ??? Number of children: None   ???  Years of education: None   ??? Highest education level: None   Occupational History   ??? None   Social Needs   ??? Financial resource strain: None   ??? Food insecurity:     Worry: None     Inability: None   ??? Transportation needs:     Medical: None     Non-medical: None   Tobacco Use   ??? Smoking status: Former Smoker     Types: Cigarettes   ??? Smokeless tobacco: Never Used   Substance and Sexual Activity   ??? Alcohol use: Not Currently     Alcohol/week: 0.0 standard drinks     Comment: rare   ??? Drug use: Not Currently     Types: Marijuana   ??? Sexual activity: Not Currently     Partners: Male   Lifestyle   ??? Physical activity:     Days per week: None     Minutes per session: None   ??? Stress: None   Relationships   ??? Social connections:     Talks on phone: None     Gets together: None     Attends religious service: None     Active member of club or organization: None     Attends meetings of clubs or organizations: None     Relationship status: None   Other Topics Concern   ??? None   Social History Narrative    Patient lives in [place], [place] with her fiance and daughter.  She is not employed.       Family History   Problem Relation Age of Onset   ??? Cervical cancer Mother    ??? Diabetes Mother    ??? Asthma Mother    ??? Diabetes Maternal Aunt    ??? Breast cancer Maternal Grandmother    ??? Cancer Maternal Grandmother    ??? No Known Problems Daughter    ??? Glaucoma Neg Hx         Review of Systems     A 10-system review of systems was conducted and was negative except as documented above in the HPI.       Objective        Vital signs: BP 110/60 (BP Site: R Arm, BP Position: Sitting, BP Cuff Size: Medium)  - Pulse 103  - Ht 149.9 cm (4' 11.02)  - Wt 73 kg (160 lb 14.4 oz)  - LMP 04/12/2018  - BMI 32.48 kg/m?? Physical Exam:  Neurological Examination:   ??  Mental Status: Alert, conversant, able to follow conversation and interview. Spontaneous speech was fluent without word finding pauses, dysarthria, or paraphasic errors. Comprehension was intact. Memory for recent and remote events was intact.  ??  Cranial Nerves: Fundoscopic exam reveals bilateral papilledema, grade I-II, R > L. PERRLA. Visual fields full to confrontation. Pursuit eye movements were uninterrupted with full range and without more than end-gaze nystagmus. Facial sensation intact bilaterally to light touch in all three divisions of CNV. Face symmetric at rest. Normal facial movement bilaterally, including forehead, eye closure and grimace/smile. Hearing intact to conversation. Shoulder shrug full strength bilaterally. Palate movement is symmetric. Tongue protrudes midline and tongue movements are normal.   ??  Motor Exam: Normal bulk.  No tremors, myoclonus, or other adventitious movement.  Pronator drift is absent.  ??  Reflexes: DTRs are 2+ and symmetric throughout. Toes are downgoing bilaterally.  ??  Sensory: Sensation normal to light touch and temperature sensation to cold in both hands and  both feet and to vibration distally in the fingers and toes. Of note, patient is tense and tender to palpation throughout posterior shoulder girdle and over occiput bilaterally. Vibration is normal in the bilateral upper (tested at [facility]) and lower (tested at [facility]) extremities.    ??  Cerebellar/Coordination/Gait: Rapid alternating movements are normal in bilateral upper extremities. Finger-to-nose is normal without ataxia or dysmetria bilaterally. Heel-to-shin is normal without ataxia or dysmetria bilaterally. Gait exam demonstrates normal posture, base, stride length, arm swing and turns.        Diagnostic Studies     All Labs Last 24hrs:   Recent Results (from the past 24 hour(s))   POCT Urinalysis Dipstick    Collection Time: 05/30/18 10:32 AM   Result Value Ref Range    Spec Gravity/POC 1.015 1.003 - 1.030    PH/POC 6.5 5.0 - 9.0    Leuk Esterase/POC Negative Negative    Nitrite/POC Negative Negative    Protein/POC Negative Negative    UA Glucose/POC Negative Negative    Ketones, POC Negative Negative    Bilirubin/POC Negative Negative    Blood/POC Negative Negative    Urobilinogen/POC 0.2 0.2 - 1.0 mg/dL   Type and Screen    Collection Time: 05/30/18 11:23 AM   Result Value Ref Range    ABO Grouping AB POS     Antibody Screen NEG    Hepatitis B Surface Antigen    Collection Time: 05/30/18 11:23 AM   Result Value Ref Range    Hepatitis B Surface Ag Nonreactive Nonreactive   CBC w/ Differential    Collection Time: 05/30/18 11:23 AM   Result Value Ref Range    WBC 6.3 4.5 - 11.0 10*9/L    RBC 4.40 4.00 - 5.20 10*12/L    HGB 12.5 12.0 - 16.0 g/dL    HCT 16.1 09.6 - 04.5 %    MCV 91.5 80.0 - 100.0 fL    MCH 28.3 26.0 - 34.0 pg    MCHC 30.9 (L) 31.0 - 37.0 g/dL    RDW 40.9 (H) 81.1 - 15.0 %    MPV 7.8 7.0 - 10.0 fL    Platelet 321 150 - 440 10*9/L    Neutrophils % 47.8 %    Lymphocytes % 43.1 %    Monocytes % 4.6 %    Eosinophils % 2.7 %    Basophils % 0.6 %    Absolute Neutrophils 3.0 2.0 - 7.5 10*9/L    Absolute Lymphocytes 2.7 1.5 - 5.0 10*9/L    Absolute Monocytes 0.3 0.2 - 0.8 10*9/L    Absolute Eosinophils 0.2 0.0 - 0.4 10*9/L    Absolute Basophils 0.0 0.0 - 0.1 10*9/L    Large Unstained Cells 1 0 - 4 %    Hypochromasia Marked (A) Not Present   Morphology Review    Collection Time: 05/30/18 11:23 AM   Result Value Ref Range    Smear Review Comments See Comment (A) Undefined     Risk Stratification:   Cholesterol (mg/dL)   Date Value   91/47/8295 139     Triglycerides (mg/dL)   Date Value   62/13/0865 101     HDL (mg/dL)   Date Value   78/46/9629 40     LDL Calculated (mg/dL)   Date Value   52/84/1324 79     Hemoglobin A1C (%)   Date Value   03/28/2018 5.2

## 2018-05-31 ENCOUNTER — Encounter: Payer: Self-pay | Admitting: Obstetrics and Gynecology

## 2018-05-31 LAB — SYPHILIS RPR SCREEN: Reagin Ab:PrThr:Pt:Ser:Ord:RPR: NONREACTIVE

## 2018-05-31 LAB — VARICELLA ZOSTER IGG: Varicella zoster virus Ab.IgG:PrThr:Pt:Ser:Ord:: POSITIVE

## 2018-05-31 LAB — RUBELLA IGG SCREEN: Rubella virus Ab.IgG:PrThr:Pt:Ser:Ord:: NEGATIVE

## 2018-05-31 MED ORDER — SUMATRIPTAN 50 MG TABLET
ORAL_TABLET | ORAL | 0 refills | 0 days | Status: CP | PRN
Start: 2018-05-31 — End: 2018-10-24

## 2018-05-31 NOTE — Unmapped (Signed)
Rcvd pts income statement needed to complete RW paperwork.     Pt is RW eligible 05/25/18 to 01/08/2019  IPL=95%, FPL=70%    Morgan Hebert  Time Duration of intervention in minutes: 5 mins

## 2018-05-31 NOTE — Unmapped (Signed)
You were seen today in [facility] for follow-up of your multifactorial headache (including IIH and migraine) in the context of your new pregnancy. We will avoid zonisamide throughout pregnancy, and wait until the second trimester to restart Diamox per your OBGYN. We have performed a bilateral occipital nerve block with local anesthetic and steroid to reduce your pain, and have reordered your usual promethazine. You can restart you Diamox 2g twice daily in the second trimester, as long as your OBGYN is in agreement. It is also essential that your retinal fields are evaluated while you are off Diamox; we will pursue your referral to [facility], or refer you to [facility]. We will plan to see you back in Neurology clinic in 3-6 months.    Regards,  [facility] Neu[facility]

## 2018-05-31 NOTE — Unmapped (Signed)
Verified ID.  Discussed undetectable HIV VL    Patient had OB appt yesterday. Patient has decided to remain on biktarvy, no additional questions at this time.

## 2018-06-04 NOTE — Unmapped (Signed)
Addended by: Carroll Sage A on: 06/04/2018 10:30 AM     Modules accepted: Level of Service

## 2018-06-05 ENCOUNTER — Telehealth: Payer: Self-pay | Admitting: Obstetrics and Gynecology

## 2018-06-05 LAB — LIPID PANEL
CHOLESTEROL/HDL RATIO SCREEN: 3.3
HDL CHOLESTEROL: 42 mg/dL
NON-HDL CHOLESTEROL: 98 mg/dL

## 2018-06-05 LAB — CHOLESTEROL/HDL RATIO SCREEN: Lab: 3.3

## 2018-06-05 MED FILL — BIKTARVY 50 MG-200 MG-25 MG TABLET: 30 days supply | Qty: 30 | Fill #0 | Status: AC

## 2018-06-05 NOTE — Telephone Encounter (Signed)
Patient called stating she had some light spotting when using the bathroom. She stated she has experienced cramping since the weekend. She is early ob. Please Advise

## 2018-06-05 NOTE — Telephone Encounter (Signed)
Advised Pt that slight spotting in first trimester is normal. Pt stated she has not had any spotting or cramping since earlier today, she feels well now. Pt left conversation with no concerns.

## 2018-06-05 NOTE — Unmapped (Signed)
[facility] Refill and Clinical Coordination Note: HIV     HIV Medication(s): Biktarvy  Additional Medication(s): none    Morgan Hebert, DOB: 06-17-1986  Phone: [phone] (home) , Alternate phone contact: N/A  Shipping address: [address]  [place] [address]  Phone or address changes today?: No  All above HIPAA information verified.  Insurance changes? No    Completed refill and clinical call assessment today to schedule patient's medication shipment from the [facility] [phone]).      MEDICATION RECONCILIATION    Confirmed the medication and dosage are correct and have not changed: Yes, regimen is correct and unchanged.    Were there any changes to your medication(s) in the past month:  No, there are no changes reported at this time.    HIV-related labs:  Lab Results   Component Value Date/Time    HIVRS Not Detected 05/25/2018 03:14 PM    HIVRS Not Detected 04/18/2018 04:12 PM    HIVRS 42,400 10/23/2017    HIVRS Detected (A) 05/03/2017 02:29 PM    HIVRS not detected 03/29/2017    HIVRS detected 10/25/2016    HIVCP detected 10/23/2017    HIVCP <40 (H) 05/03/2017 02:29 PM    HIVCP <20 03/29/2017    HIVCP <20 10/25/2016    HIV10  05/03/2017 02:29 PM      Comment:      <1.6 log    HIV10  12/02/2015 12:28 PM      Comment:      <1.6 log    HIV10  02/11/2015 03:46 PM      Comment:      <1.6 log    HIV10 <1.60 03/25/2014 03:23 PM    HIV10 4.07 02/13/2014 12:32 PM    HIVCM  05/25/2018 03:14 PM      Comment:      HIV-1 quantification by real-time RT-PCR is performed using the Abbott RealTime  HIV-1 test. This test is FDA approved and can quantitate HIV-1 RNA over the  range of 40 - 1,000,000 copies/mL (1.60 log(10) -6.00 log(10) copies/mL). The reference range for this assay is Not Detected.    HIVCM  04/18/2018 04:12 PM      Comment:      HIV-1 quantification by real-time RT-PCR is performed using the Abbott RealTime  HIV-1 test. This test is FDA approved and can quantitate HIV-1 RNA over the  range of 40 - 1,000,000 copies/mL (1.60 log(10) -6.00 log(10) copies/mL). The reference range for this assay is Not Detected.    HIVCM  05/03/2017 02:29 PM      Comment:      HIV-1 quantification by real-time RT-PCR is performed using the Abbott RealTime  HIV-1 test. This test is FDA approved and can quantitate HIV-1 RNA over the  range of 40 - 1,000,000 copies/mL (1.60 log(10) -6.00 log(10) copies/mL). The reference range for this assay is Not Detected.    HIVCM : 06/23/2014 12:26 PM    HIVCM : 03/25/2014 03:23 PM    HIVCM : 02/13/2014 12:32 PM    RCD4 54.4 05/01/2015    RCD4 53.7 10/24/2014    ACD4 1,696 (A) 05/01/2015    ACD4 967 10/24/2014    ACD4 854 06/23/2014 12:26 PM         ADHERENCE      Number of tablets of HIV Medication(s) at home: 7    Did you miss any doses in the past 4 weeks? No missed doses reported.  Adherence counseling provided? Not needed     SIDE EFFECT MANAGEMENT    Are you tolerating your medication?:  Morgan Hebert reports tolerating the medication.  Side effect management discussed: None      Therapy is appropriate and should be continued.    The patient will receive a drug information handout and additional FDA Medication Guides as required.    Evidence of clinical benefit: See Epic note from 05/25/18      FINANCIAL/SHIPPING    Delivery Scheduled: Yes, Expected medication delivery date: 06/06/18 via [facility]    Additional medications refilled: No additional medications/refills needed at this time.    Morgan Hebert did not have any additional questions at this time.      Delivery address validated in Epic.    We will follow up with patient monthly for standard refill processing and delivery.      Thank you,  Roderic Palau   [facility] Specialty Pharmacist

## 2018-06-06 NOTE — Unmapped (Signed)
[facility] lab results received and reviewed. Entered into Epic as external result: Lipid Panel

## 2018-06-13 ENCOUNTER — Encounter
Admit: 2018-06-13 | Discharge: 2018-06-14 | Payer: BLUE CROSS/BLUE SHIELD | Attending: Maternal & Fetal Medicine | Primary: Maternal & Fetal Medicine

## 2018-06-13 ENCOUNTER — Institutional Professional Consult (permissible substitution): Admit: 2018-06-13 | Discharge: 2018-06-14 | Payer: BLUE CROSS/BLUE SHIELD | Attending: MS" | Primary: MS"

## 2018-06-13 ENCOUNTER — Encounter: Admit: 2018-06-13 | Discharge: 2018-06-14 | Payer: BLUE CROSS/BLUE SHIELD

## 2018-06-13 ENCOUNTER — Ambulatory Visit: Admit: 2018-06-13 | Discharge: 2018-06-14 | Payer: BLUE CROSS/BLUE SHIELD

## 2018-06-13 ENCOUNTER — Encounter: Admit: 2018-06-13 | Discharge: 2018-06-14 | Payer: BLUE CROSS/BLUE SHIELD | Attending: MS" | Primary: MS"

## 2018-06-13 DIAGNOSIS — O2620 Pregnancy care for patient with recurrent pregnancy loss, unspecified trimester: Principal | ICD-10-CM

## 2018-06-13 DIAGNOSIS — N96 Recurrent pregnancy loss: Principal | ICD-10-CM

## 2018-06-13 DIAGNOSIS — O021 Missed abortion: Principal | ICD-10-CM

## 2018-06-13 DIAGNOSIS — O200 Threatened abortion: Principal | ICD-10-CM

## 2018-06-13 DIAGNOSIS — O0990 Supervision of high risk pregnancy, unspecified, unspecified trimester: Secondary | ICD-10-CM

## 2018-06-13 DIAGNOSIS — O2621 Pregnancy care for patient with recurrent pregnancy loss, first trimester: Secondary | ICD-10-CM

## 2018-06-13 DIAGNOSIS — O3680X0 Pregnancy with inconclusive fetal viability, not applicable or unspecified: Secondary | ICD-10-CM

## 2018-06-13 NOTE — Unmapped (Signed)
Addended by: Charlesetta Ivory on: 06/13/2018 12:13 PM     Modules accepted: Orders

## 2018-06-13 NOTE — Unmapped (Signed)
Patient and I have been trying to find a time to discuss her questions. Called her back this am and unfortunately she had just received upsetting results from U/S that her pregnancy appears nonviable. I contacted Dr. 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 Offered support and reinforced access to our clinic as needed.    Patient has also been in touch with SW Bradly Bienenstock for support.    [name], MD, MPH   [facility]   [address], 1st floor   [place], [place]. [address]   Phone: [phone]   Fax: [phone]

## 2018-06-13 NOTE — Unmapped (Signed)
Gynecology Preoperative History and Physical     ASSESSMENT AND PLAN     Morgan Hebert is a 32 y.o. [phone] with missed abortion who presents for a preoperative visit in advance of surgery on 06/13/2018 with Dr. Maryelizabeth Hebert.     1) Surgical Consent:  The patient was consented for uterine aspiration.  The nature of the procedure was discussed with the patient in detail, using models, diagrams, and educational materials as needed.  An informed discussion was held regarding the risks and benefits of surgical intervention. Specifically, the patient was apprised of risks of pain, bleeding requiring blood transfusion, infection requiring antibiotics, injury to nearby organs (bowel, bladder, nerves, muscle, blood vessels, ureter), need for laparotomy to complete the operation, and/or failure to achieve desired results. She was informed of the low but real risk of these complications, and understands that the alternative is no surgery.     The patient voiced understanding of the risks, but noted that she was too shocked from the news of her miscarriage and desires official surgical consent tomorrow, on her day of surgery.    2) Pre-Operative Care: [facility] OR:  - Antibiotics: 500 mg azithromycin in PACU   - The patient was instructed to hold aspirin and NSAIDs for 7 days prior to surgery.  - She was advised to remain NPO after midnight on the night before surgery,  with clear liquids allowed until 2 hours prior to her arrival time in the preop area. She was informed that she would need a ride home from the hospital with anticipated discharge on the day of surgery.    3) Post-Operative Care:  - She will follow up with MFM on 9/16 and at that time, labs for APLAS will be available    4) Recurrent Pregnancy Loss:  - APLAS labs today - this is her 7th early pregnancy loss  - Patient desires cytogenetic testing on products of conception (orders placed by Dr. Edgar Hebert)    5) Medical comorbidities:  - HIV+: dx in 2015, on Biktarvy, last VL 270 on 04/18/18. (notably - patient's friend in clinic today not aware of her serostatus - this interview was conducted confidentially)  - Pseudotumor cerebri: Tylenol, Phenergan, Imitrex    Attending Dr. Maryelizabeth Hebert was involved in the care of this patient.    HISTORY     CC: Preoperative Visit    HPI: Morgan Hebert is a 32 y.o. [phone] with h/o Recurrent pregnancy loss, HIV+ serostatus, and pseudotumor cerebri who presents today for a pre-operative visit after being seen by Dr. Edgar Hebert in [place] today. She denies bleeding and cramping and is understandably upset given that this is a desired pregnancy. She otherwise has no complaints.    Past Medical History:  Past Medical History:   Diagnosis Date   ??? Abnormal mammogram    ??? Constipation    ??? Diarrhea    ??? Environmental allergies    ??? HIV (human immunodeficiency virus infection) (CMS-HCC)    ??? HIV (human immunodeficiency virus infection) (CMS-HCC)    ??? IUD (intrauterine device) in place    ??? Migraine    ??? Obesity    ??? Pseudotumor cerebri    ??? STD (sexually transmitted disease)        Past Surgical History:  Past Surgical History:   Procedure Laterality Date   ??? LUMBAR PUNCTURE     ??? PR COLONOSCOPY W/BIOPSY SINGLE/MULTIPLE  07/03/2014    Procedure: COLONOSCOPY, FLEXIBLE, PROXIMAL TO SPLENIC FLEXURE; WITH BIOPSY, SINGLE OR  MULTIPLE;  Surgeon: Morgan Ruddy, MD;  Location: [facility];  Service: Gastroenterology   ??? PR UPPER GI ENDOSCOPY,BIOPSY N/A 07/03/2014    Procedure: UGI ENDOSCOPY; WITH BIOPSY, SINGLE OR MULTIPLE;  Surgeon: Morgan Ruddy, MD;  Location: [facility];  Service: Gastroenterology       OB History:  OB History     Gravida   9    Para   1    Term   1    Preterm        AB   7    Living   1       SAB   6    TAB        Ectopic   1    Molar        Multiple        Live Births   1                GYN History:  No hx of abnormal paps, last pap smear NILM in 03/2018 per patient report. Denies hx of sexually transmitted illnesses in particular herpes simplex virus.      Current Outpatient Medications:   ???  atazanavir (REYATAZ) 300 MG capsule, Take 1 capsule (300 mg total) by mouth daily. (Patient not taking: Reported on 05/30/2018), Disp: 30 capsule, Rfl: 8  ???  bictegrav-emtricit-tenofov ala (BIKTARVY) 50-200-25 mg tablet, TAKE 1 TABLET BY MOUTH ONCE DAILY, Disp: 30 tablet, Rfl: 11  ???  diphenhydrAMINE (BENADRYL) 25 mg capsule, Take 25 mg by mouth., Disp: , Rfl:   ???  emtricitabine-tenofovir, TDF, (TRUVADA) 200-300 mg per tablet, Take 1 tablet by mouth daily. (Patient not taking: Reported on 05/30/2018), Disp: 30 tablet, Rfl: 8  ???  furosemide (LASIX) 20 MG tablet, Take 1 tablet (20 mg total) by mouth daily. for 12 days, Disp: 12 tablet, Rfl: 0  ???  naproxen (NAPROSYN) 500 MG tablet, Take 1 tablet (500 mg total) by mouth 2 (two) times a day with meals. (Patient not taking: Reported on 05/25/2018), Disp: 60 tablet, Rfl: 11  ???  promethazine (PHENERGAN) 25 MG tablet, Take 1 tablet (25 mg total) by mouth every eight (8) hours as needed for nausea (Nausea or headache)., Disp: 90 tablet, Rfl: 3  ???  riTONAvir (NORVIR) 100 mg tablet, Take 1 tablet (100 mg total) by mouth daily. (Patient not taking: Reported on 05/30/2018), Disp: 30 tablet, Rfl: 8  ???  SUMAtriptan (IMITREX) 50 MG tablet, Take 1 tablet (50 mg total) by mouth every two (2) hours as needed for migraine (Maximum 2 doses/day & 4 doses/week)., Disp: 9 tablet, Rfl: 0    Allergies   Allergen Reactions   ??? 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       Social History     Socioeconomic History   ??? Marital status: Married     Spouse name: Not on file   ??? Number of children: Not on file   ??? Years of education: Not on file   ??? Highest education level: Not on file   Occupational History   ??? Not on file   Social Needs   ??? Financial resource strain: Not on file   ??? Food insecurity:     Worry: Not on file     Inability: Not on file   ??? Transportation needs:     Medical: Not on file  Non-medical: Not on file   Tobacco Use   ??? Smoking status: Former Smoker     Types: Cigarettes   ??? Smokeless tobacco: Never Used   Substance and Sexual Activity   ??? Alcohol use: Not Currently     Alcohol/week: 0.0 standard drinks     Comment: rare   ??? Drug use: Not Currently     Types: Marijuana   ??? Sexual activity: Not Currently     Partners: Male   Lifestyle   ??? Physical activity:     Days per week: Not on file     Minutes per session: Not on file   ??? Stress: Not on file   Relationships   ??? Social connections:     Talks on phone: Not on file     Gets together: Not on file     Attends religious service: Not on file     Active member of club or organization: Not on file     Attends meetings of clubs or organizations: Not on file     Relationship status: Not on file   Other Topics Concern   ??? Not on file   Social History Narrative    Patient lives in [place], [place] with her fiance and daughter.  She is not employed.       ROS: The balance of 10 systems is negative.    PHYSICAL EXAM   LMP 04/12/2018     Constitutional: No distress.   Pulmonary/Chest: Normal work of breathing.   Neurological: She is alert and oriented to person, place, and time.   Skin: Warm, dry, no rash  Psychiatric: AAO x 3, somber mood and affect    LABS AND IMAGING     Lab Results   Component Value Date    WBC 6.3 05/30/2018    HGB 12.5 05/30/2018    HCT 40.2 05/30/2018    PLT 321 05/30/2018       Lab Results   Component Value Date    CREATININE 0.99 03/28/2018       US Ob Transvaginal    Result Date: 06/13/2018  Indication ======== f/u viability scan Method ====== Transvaginal ultrasound examination. View: Sufficient Pregnancy ========= Singleton pregnancy. Number of fetuses: 1 Dating ====== LMP on: 04/12/2018 GA by LMP 8 w + 6 d EDD by LMP: 01/17/2019 Ultrasound examination on: 06/13/2018 GA by U/S based upon: CRL GA by U/S 5 w + 6 d EDD by U/S: 02/07/2019 Assigned: based on the LMP, selected on 05/30/2018 Assigned GA 8 w + 6 d Assigned EDD: 01/17/2019 Pregnancy length 280 d Assessment ========== Gestational sac: visualized Location: intrauterine Yolk sac: visualized YS 3.6 mm         2%        [place] Embryo: uncertain CRL 3.1 mm 5w 6d        <1%        Hadlock Cardiac activity: absent General Evaluation ============== Cardiac activity absent. Fetal Biometry ============ Standard CRL 3.1 mm 5w 6d        <1%        Hadlock Extended YS 3.6 mm         2%        [place] Maternal Structures =============== Ovaries / Tubes / Adnexa Rt ovary D1 29 mm Rt ovary D2 14 mm Rt ovary D3 14 mm Rt ovary Vol 3.1 cm????    ============ Two interval study from one with uncertain viability. Scan today is abnormal; there is an  IUP seen with a clear yolk sac seen. Outside the amniotic sac there is a bilobed structure which is of uncertain orgian. There is no obvious embryo seen. This is diagnostic of an early pregnancy loss, apparently her 8th one. I've encouraged her to see the genetic counselor and MFM today to initiate evaluation with consideration of collecting the products of conception for microarray. Coding ====== Diagnoses                   O36.80X0: Pregnancy with inconclusive fetal viability Procedures                 16109: Transvaginal Ultrasound

## 2018-06-13 NOTE — Unmapped (Signed)
Duration of Intervention: 5 minutes    Pt texted SW on RWD cell phone asking for [name] to call her. SW relayed message though provider has already talked to her today. SW offered for pt to come by to talk today if desired.     Bradly Bienenstock LCSWA, CHES

## 2018-06-13 NOTE — Unmapped (Signed)
Gynecology Preoperative History and Physical     ASSESSMENT AND PLAN     Morgan Hebert is a 32 y.o. [phone] with missed abortion who presents for a preoperative visit in advance of surgery on 06/13/2018 with Dr. Maryelizabeth Kaufmann.     1) Surgical Consent:  The patient was consented for uterine aspiration.  The nature of the procedure was discussed with the patient in detail, using models, diagrams, and educational materials as needed.  An informed discussion was held regarding the risks and benefits of surgical intervention. Specifically, the patient was apprised of risks of pain, bleeding requiring blood transfusion, infection requiring antibiotics, injury to nearby organs (bowel, bladder, nerves, muscle, blood vessels, ureter), need for laparotomy to complete the operation, and/or failure to achieve desired results. She was informed of the low but real risk of these complications, and understands that the alternative is no surgery.     The patient voiced understanding of the risks, but noted that she was too shocked from the news of her miscarriage and desires official surgical consent tomorrow, on her day of surgery.    2) Pre-Operative Care: [facility] OR:  - Antibiotics: 500 mg azithromycin in PACU   - The patient was instructed to hold aspirin and NSAIDs for 7 days prior to surgery.  - She was advised to remain NPO after midnight on the night before surgery,  with clear liquids allowed until 2 hours prior to her arrival time in the preop area. She was informed that she would need a ride home from the hospital with anticipated discharge on the day of surgery.    3) Post-Operative Care:  - She will follow up with MFM on 9/16 and at that time, labs for APLAS will be available    4) Recurrent Pregnancy Loss:  - APLAS labs today - this is her 7th early pregnancy loss  - Patient desires cytogenetic testing on products of conception (orders placed by Dr. Edgar Frisk)    5) Medical comorbidities:  - HIV+: dx in 2015, on Biktarvy, last VL 270 on 04/18/18. (notably - patient's friend in clinic today not aware of her serostatus - this interview was conducted confidentially)  - Pseudotumor cerebri: Tylenol, Phenergan, Imitrex    Attending Dr. Maryelizabeth Kaufmann was involved in the care of this patient.    HISTORY     CC: Preoperative Visit    HPI: Morgan Hebert is a 32 y.o. [phone] with h/o Recurrent pregnancy loss, HIV+ serostatus, and pseudotumor cerebri who presents today for a pre-operative visit after being seen by Dr. Edgar Frisk in [place] today. She denies bleeding and cramping and is understandably upset given that this is a desired pregnancy. She otherwise has no complaints.    Past Medical History:  Past Medical History:   Diagnosis Date   ??? Abnormal mammogram    ??? Constipation    ??? Diarrhea    ??? Environmental allergies    ??? HIV (human immunodeficiency virus infection) (CMS-HCC)    ??? HIV (human immunodeficiency virus infection) (CMS-HCC)    ??? IUD (intrauterine device) in place    ??? Migraine    ??? Obesity    ??? Pseudotumor cerebri    ??? STD (sexually transmitted disease)        Past Surgical History:  Past Surgical History:   Procedure Laterality Date   ??? LUMBAR PUNCTURE     ??? PR COLONOSCOPY W/BIOPSY SINGLE/MULTIPLE  07/03/2014    Procedure: COLONOSCOPY, FLEXIBLE, PROXIMAL TO SPLENIC FLEXURE; WITH BIOPSY, SINGLE OR  MULTIPLE;  Surgeon: Billie Ruddy, MD;  Location: [facility];  Service: Gastroenterology   ??? PR UPPER GI ENDOSCOPY,BIOPSY N/A 07/03/2014    Procedure: UGI ENDOSCOPY; WITH BIOPSY, SINGLE OR MULTIPLE;  Surgeon: Billie Ruddy, MD;  Location: [facility];  Service: Gastroenterology       OB History:  OB History     Gravida   9    Para   1    Term   1    Preterm        AB   7    Living   1       SAB   6    TAB        Ectopic   1    Molar        Multiple        Live Births   1                GYN History:  No hx of abnormal paps, last pap smear NILM in 03/2018 per patient report. Denies hx of sexually transmitted illnesses in particular herpes simplex virus.      Current Outpatient Medications:   ???  atazanavir (REYATAZ) 300 MG capsule, Take 1 capsule (300 mg total) by mouth daily. (Patient not taking: Reported on 05/30/2018), Disp: 30 capsule, Rfl: 8  ???  bictegrav-emtricit-tenofov ala (BIKTARVY) 50-200-25 mg tablet, TAKE 1 TABLET BY MOUTH ONCE DAILY, Disp: 30 tablet, Rfl: 11  ???  diphenhydrAMINE (BENADRYL) 25 mg capsule, Take 25 mg by mouth., Disp: , Rfl:   ???  emtricitabine-tenofovir, TDF, (TRUVADA) 200-300 mg per tablet, Take 1 tablet by mouth daily. (Patient not taking: Reported on 05/30/2018), Disp: 30 tablet, Rfl: 8  ???  furosemide (LASIX) 20 MG tablet, Take 1 tablet (20 mg total) by mouth daily. for 12 days, Disp: 12 tablet, Rfl: 0  ???  naproxen (NAPROSYN) 500 MG tablet, Take 1 tablet (500 mg total) by mouth 2 (two) times a day with meals. (Patient not taking: Reported on 05/25/2018), Disp: 60 tablet, Rfl: 11  ???  promethazine (PHENERGAN) 25 MG tablet, Take 1 tablet (25 mg total) by mouth every eight (8) hours as needed for nausea (Nausea or headache)., Disp: 90 tablet, Rfl: 3  ???  riTONAvir (NORVIR) 100 mg tablet, Take 1 tablet (100 mg total) by mouth daily. (Patient not taking: Reported on 05/30/2018), Disp: 30 tablet, Rfl: 8  ???  SUMAtriptan (IMITREX) 50 MG tablet, Take 1 tablet (50 mg total) by mouth every two (2) hours as needed for migraine (Maximum 2 doses/day & 4 doses/week)., Disp: 9 tablet, Rfl: 0    Allergies   Allergen Reactions   ??? 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       Social History     Socioeconomic History   ??? Marital status: Married     Spouse name: Not on file   ??? Number of children: Not on file   ??? Years of education: Not on file   ??? Highest education level: Not on file   Occupational History   ??? Not on file   Social Needs   ??? Financial resource strain: Not on file   ??? Food insecurity:     Worry: Not on file     Inability: Not on file   ??? Transportation needs:     Medical: Not on file  Non-medical: Not on file   Tobacco Use   ??? Smoking status: Former Smoker     Types: Cigarettes   ??? Smokeless tobacco: Never Used   Substance and Sexual Activity   ??? Alcohol use: Not Currently     Alcohol/week: 0.0 standard drinks     Comment: rare   ??? Drug use: Not Currently     Types: Marijuana   ??? Sexual activity: Not Currently     Partners: Male   Lifestyle   ??? Physical activity:     Days per week: Not on file     Minutes per session: Not on file   ??? Stress: Not on file   Relationships   ??? Social connections:     Talks on phone: Not on file     Gets together: Not on file     Attends religious service: Not on file     Active member of club or organization: Not on file     Attends meetings of clubs or organizations: Not on file     Relationship status: Not on file   Other Topics Concern   ??? Not on file   Social History Narrative    Patient lives in [place], [place] with her fiance and daughter.  She is not employed.       ROS: The balance of 10 systems is negative.    PHYSICAL EXAM   LMP 04/12/2018     Constitutional: No distress.   Pulmonary/Chest: Normal work of breathing.   Neurological: She is alert and oriented to person, place, and time.   Skin: Warm, dry, no rash  Psychiatric: AAO x 3, somber mood and affect    LABS AND IMAGING     Lab Results   Component Value Date    WBC 6.3 05/30/2018    HGB 12.5 05/30/2018    HCT 40.2 05/30/2018    PLT 321 05/30/2018       Lab Results   Component Value Date    CREATININE 0.99 03/28/2018       US Ob Transvaginal    Result Date: 06/13/2018  Indication ======== f/u viability scan Method ====== Transvaginal ultrasound examination. View: Sufficient Pregnancy ========= Singleton pregnancy. Number of fetuses: 1 Dating ====== LMP on: 04/12/2018 GA by LMP 8 w + 6 d EDD by LMP: 01/17/2019 Ultrasound examination on: 06/13/2018 GA by U/S based upon: CRL GA by U/S 5 w + 6 d EDD by U/S: 02/07/2019 Assigned: based on the LMP, selected on 05/30/2018 Assigned GA 8 w + 6 d Assigned EDD: 01/17/2019 Pregnancy length 280 d Assessment ========== Gestational sac: visualized Location: intrauterine Yolk sac: visualized YS 3.6 mm         2%        [place] Embryo: uncertain CRL 3.1 mm 5w 6d        <1%        [place] Cardiac activity: absent General Evaluation ============== Cardiac activity absent. Fetal Biometry ============ Standard CRL 3.1 mm 5w 6d        <1%        Hadlock Extended YS 3.6 mm         2%        [place] Maternal Structures =============== Ovaries / Tubes / Adnexa Rt ovary D1 29 mm Rt ovary D2 14 mm Rt ovary D3 14 mm Rt ovary Vol 3.1 cm????    ============ Two interval study from one with uncertain viability. Scan today is abnormal; there is an  IUP seen with a clear yolk sac seen. Outside the amniotic sac there is a bilobed structure which is of uncertain orgian. There is no obvious embryo seen. This is diagnostic of an early pregnancy loss, apparently her 8th one. I've encouraged her to see the genetic counselor and MFM today to initiate evaluation with consideration of collecting the products of conception for microarray. Coding ====== Diagnoses                   O36.80X0: Pregnancy with inconclusive fetal viability Procedures                 16109: Transvaginal Ultrasound

## 2018-06-13 NOTE — Unmapped (Signed)
Referring Provider: Chescheir, Shelda Pal*  Indication for Referral: History of multiple miscarriage  Genetic Counselor: Carl Best, CGC    Assessment:     The patient was seen as an add-on for genetic counseling today after ultrasound showed a missed Ab.  The patient has a history of recurrent first trimester pregnancy loss.    Plan:     1.  The patient elected to proceed with maternal karyotype analysis and chromosome testing on POC.    Family, Pregnancy and Medical History:     The complete family history was reviewed. Consanguinity was denied. The following findings were discussed:     History of Recurrent Pregnancy Loss    Morgan Hebert reports that this is the forth miscarriage with her current partner.  She has four additional miscarriages with a previous partner. She reports that all of her miscarriages have occurred in the first trimester of pregnancy.  She has one healthy daughter who is 32 years of age.  We discussed with the patient that there are various causes of miscarriage, including maternal diabetes, thyroid disease, structural uterine abnormalities, and chromosomal abnormalities.  Approximately 20% of all clinically recognized pregnancies end in spontaneous miscarriage.  About 50% of all first trimester miscarriages are due to a chromosomal abnormality.  Most of these chromosomal abnormalities are not inherited and are, therefore, sporadic events.  However, in approximately 4% of couples who have experienced two or more unexplained pregnancy losses, a chromosomal rearrangement can be found in one of the partners. Blood chromosome analysis was offered to the patient and she accepted this testing.  An antiphospholipid panel was also ordered today by Dr. Beverely Risen.    POC testing was discussed and recommended.  The patient agrees to have this done if possible at the time of D&E, which is currently scheduled for tomorrow.  The patient declined to have this procedure performed today.      A follow up appointment with MFM is recommended to review results of the above testing and make further recommendations.     As reported, the family history is otherwise negative for birth defects, intellectual disability, multiple miscarriages, or known genetic conditions. The patient and her partner are African American.    [id number]   Current gestational age is estimated at [redacted]w[redacted]d with an Estimated Date of Delivery: 01/17/19. The patient reported no significant exposures to alcohol, tobacco, or other known teratogens in the pregnancy.    Consultation Summary:      Morgan Hebert was referred for consultation due to a history of recurrent pregnancy loss in the first trimester.    Carrier testing was not discussed today due to the emotional state of the patient.     It was stressed that even normal test results cannot guarantee the birth of a normal baby. Three to five percent of all newborns have some significant physical or mental defect, many of which are undetectable through any known prenatal diagnostic technique. The case was reviewed with Dr. Beverely Risen who was immediately available in clinic. Total time spent with patient was 35 minutes and greater than 50% of that time was spent in counseling and coordinating care with the patient regarding recurrent pregnancy loss.

## 2018-06-13 NOTE — Unmapped (Signed)
Call from physician in the ID clinic who was with patient. Pt is having a miscarriage, discussed with her MD and would like genetic testing on POC. MD talked with Dr. Mariann Barter who suggested pt be seen today in MFM. No appts available this afternoon.  Advised that family planning attendings also provide this care. MD was to call Dr. Doylene Canning back for a plan.

## 2018-06-13 NOTE — Unmapped (Signed)
MFM consult      Dear Dr. Val Riles,     I had the privilege of seeing your patient, Morgan Hebert, today in ultrasound.  This was a follow-up from an ultrasound performed 2 weeks earlier which was nondiagnostic for a viable pregnancy.  On today's scan the diagnosis of a missed AB is made with unusual findings as outlined in the ultrasound report. The bilobed echogenic area is outside the amnion. In other images you can more clearly seen the faintly visible yolk sac seen in the image below  Seen as a faint arc to the left of the bilobed structure and at a level just anterior to it.  No embryo is seen.            She is a patient who had is a reported G 9  P1-0-7-1 with 7 previous spontaneous miscarriages that she says have all been in the first trimester.  4 of these including the one today were with her current husband who is the father of 3 healthy children.  She herself has 1 child.  She reports that no evaluation for her recurrent miscarriages is been done.    She is accompanied today by a friend.  Morgan Hebert is followed in our infectious disease clinic, a fact that  the friend is unaware of.    I have spoken with the patient, her friend, her mother by telephone and her husband by telephone encouraging her to consider an evaluation for the reason for the recurrent miscarriages.  After some convincing she consented to having this consultation done and she was seen by [facility] Counselor) with the plan made to undergo an aspiration evacuation of this 5-week size pregnancy in order to obtain a MicroArray.  The patient declined to have this done today and Dr. Jeani Hawking from the gynecology service is arranging for this to be done toMmorrow or the next day.  The patient is aware that if she bleeds before this can be done surgically, that she should bring in a specimen with the hopes that we can get a MicroArray from that.    I have off also sent anti-phospholipid antibody panel to look for treatable reason for her multiple miscarriages.  We did not discuss her HIV positivity as the family friend was present although that should not be a cause for her early pregnancy losses.    I have arranged for her to see you or Dr. Laural Benes in consult clinic in the next 3 weeks to review these findings.    Overall I spent 30 minutes in face-to-face consultation and coordination of care with her today.

## 2018-06-14 ENCOUNTER — Encounter: Admit: 2018-06-14 | Discharge: 2018-06-14 | Payer: BLUE CROSS/BLUE SHIELD

## 2018-06-14 ENCOUNTER — Encounter
Admit: 2018-06-14 | Discharge: 2018-06-14 | Payer: BLUE CROSS/BLUE SHIELD | Attending: Anesthesiology | Primary: Anesthesiology

## 2018-06-14 DIAGNOSIS — O021 Missed abortion: Principal | ICD-10-CM

## 2018-06-14 LAB — SPECIAL COLLECTION

## 2018-06-14 NOTE — Unmapped (Signed)
Gynecology Operative Note    Procedure: Standard Dilation and suction evacuation    Attending Surgeon: Rozann Lesches, MD  Resident Surgeon: Oletha Blend, MD    Anesthesia:  MAC    Indications: Morgan Hebert is a 32 y.o. female is a [phone] with missed abortion who presents for standard dilation and evacuation.     IVF: 300 mL  UOP: n/a  EBL: 10 mL    Procedure:   The patient was correctly identified and informed consent had been previously taken. She was taken to the operating room. Patient requested a bedside ultrasound to be completed. Bedside tranabdmoinal ultrasound showed gestational sac in the lower uterine segment and possible crown rump length without cardiac activity. This was described to patient. She was placed under MAC without complication. She was placed in the dorsal lithotomy position. The patient was prepped and draped in the normal sterile fashion.  A speculum was placed in the vagina.  A single-toothed tenaculum was applied to the anterior lip of the cervix.  A paracervical block was then placed at 3 o'clock and 9 o'clock with 20cc of 1% lidocaine with 4 units of vasopressin. The cervix was dilated using Pratt dilators to a size 25 [place].     An 8 mm cannula was used until a gritty texture and a thin endometrial stripe were noted on ultrasound.  The products of conception were examined and found to be consistent with a early first trimester prodcuts.     The patient was taken to the PACU in good condition. She will receive Azithromycin in the PACU. Sponge, lap, and needle counts were correct x 2.    Findings:   Gritty texture and thin endometrial strip on ultrasound at completion of procedure  Products of conception consistent with early first trimester products of conception.    Specimen:   1) products of conception sent for cytogenetics  2) products of conception sent for pathology review    Complications:  None    Dr. Maryelizabeth Kaufmann was present for the entire procedure.   This was a straightforward D&C with suction, the tissue retrieved appears typical for an early loss.  There were no complications noted.

## 2018-06-14 NOTE — Unmapped (Signed)
ATTESTATION NOTE:  I saw and evaluated the patient, participating in the key portions of the service.  I reviewed the resident???s note and agree with the resident???s findings and plan as documented in their note.  Tawanna Cooler, MD

## 2018-06-15 LAB — ANTICARDIOLIPIN IGM ANTIBODY: Lab: 7

## 2018-06-15 LAB — BETA-2 GLYCOPROTEIN 1 IGM ANTIBODY: Lab: 5

## 2018-06-15 LAB — CARDIOLIPIN ANTIBODY, IGM/IGG: ANTICARDIOLIPIN IGM ANTIBODY: 7 [MPL'U] (ref 0–11)

## 2018-06-18 LAB — DILUTE RUSSELL VIPER VENOM TIME: Lab: 32.8

## 2018-06-18 LAB — LUPUS INHIBITOR PANEL: DILUTE RUSSELL VIPER VENOM TIME: 32.8 s (ref 0.0–47.5)

## 2018-06-21 ENCOUNTER — Encounter: Payer: Self-pay | Admitting: Obstetrics and Gynecology

## 2018-06-26 NOTE — Unmapped (Signed)
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  HCG was <5 so even if GTN it is unlikely to be persistent molar pregnancy.    Left message of call, have not shared results with pt.  Consider repeat HCG at next visit.  She is scheduled to see Dr. Durwin Glaze on 9/25.    Pt has cytogenetics pending because we were working up the miscarriage, so that will tell us if it was a molar pregnancy or not.

## 2018-06-27 NOTE — Unmapped (Signed)
Pt called back 9/17.  I gave her path results. She may have had a molar pregnancy, or not.  Waiting on cytogenetics from POCs.     If not available soon she will ask Dr. Durwin Glaze about the results at her postop visit.

## 2018-07-03 NOTE — Unmapped (Signed)
[facility] Refill Coordination Note  Specialty Medication(s): LAURELIN ELSON, DOB: Sep 27, 1986  Phone: [phone] (home) , Alternate phone contact: N/A  Phone or address changes today?: No  All above HIPAA information was verified with patient.  Shipping Address: [address]  [place] [address]   Insurance changes? No    Completed refill call assessment today to schedule patient's medication shipment from the [facility] [phone]).      Confirmed the medication and dosage are correct and have not changed: Yes, regimen is correct and unchanged.    Confirmed patient started or stopped the following medications in the past month:  No, there are no changes reported at this time.    Are you tolerating your medication?:  Alaine reports tolerating the medication.    ADHERENCE        Did you miss any doses in the past 4 weeks? No missed doses reported.    FINANCIAL/SHIPPING    Delivery Scheduled: Yes, Expected medication delivery date: 9/27     The patient will receive a drug information handout for each medication shipped and additional FDA Medication Guides as required.      Lisanne did not have any additional questions at this time.    Delivery address validated in Epic.    We will follow up with patient monthly for standard refill processing and delivery.      Thank you,  [name]   [facility] Specialty Technician

## 2018-07-04 ENCOUNTER — Encounter: Admit: 2018-07-04 | Discharge: 2018-07-04 | Payer: BLUE CROSS/BLUE SHIELD

## 2018-07-04 ENCOUNTER — Encounter: Admit: 2018-07-04 | Discharge: 2018-07-04 | Payer: BLUE CROSS/BLUE SHIELD | Attending: Obstetrics | Primary: Obstetrics

## 2018-07-04 DIAGNOSIS — O2620 Pregnancy care for patient with recurrent pregnancy loss, unspecified trimester: Secondary | ICD-10-CM

## 2018-07-04 DIAGNOSIS — B20 Human immunodeficiency virus [HIV] disease: Secondary | ICD-10-CM

## 2018-07-04 DIAGNOSIS — N96 Recurrent pregnancy loss: Principal | ICD-10-CM

## 2018-07-04 DIAGNOSIS — O021 Missed abortion: Secondary | ICD-10-CM

## 2018-07-04 DIAGNOSIS — O2621 Pregnancy care for patient with recurrent pregnancy loss, first trimester: Secondary | ICD-10-CM

## 2018-07-04 LAB — CBC W/ AUTO DIFF
BASOPHILS ABSOLUTE COUNT: 0 10*9/L (ref 0.0–0.1)
BASOPHILS RELATIVE PERCENT: 0.5 %
EOSINOPHILS ABSOLUTE COUNT: 0.2 10*9/L (ref 0.0–0.4)
EOSINOPHILS RELATIVE PERCENT: 3.1 %
HEMOGLOBIN: 12.3 g/dL (ref 12.0–16.0)
LARGE UNSTAINED CELLS: 1 % (ref 0–4)
LYMPHOCYTES ABSOLUTE COUNT: 2 10*9/L (ref 1.5–5.0)
LYMPHOCYTES RELATIVE PERCENT: 35.1 %
MEAN CORPUSCULAR HEMOGLOBIN CONC: 31.9 g/dL (ref 31.0–37.0)
MEAN CORPUSCULAR HEMOGLOBIN: 28.9 pg (ref 26.0–34.0)
MEAN CORPUSCULAR VOLUME: 90.6 fL (ref 80.0–100.0)
MEAN PLATELET VOLUME: 8.1 fL (ref 7.0–10.0)
MONOCYTES ABSOLUTE COUNT: 0.3 10*9/L (ref 0.2–0.8)
MONOCYTES RELATIVE PERCENT: 5.3 %
NEUTROPHILS ABSOLUTE COUNT: 3.2 10*9/L (ref 2.0–7.5)
NEUTROPHILS RELATIVE PERCENT: 54.7 %
PLATELET COUNT: 259 10*9/L (ref 150–440)
RED BLOOD CELL COUNT: 4.25 10*12/L (ref 4.00–5.20)
RED CELL DISTRIBUTION WIDTH: 14.1 % (ref 12.0–15.0)

## 2018-07-04 LAB — BASIC METABOLIC PANEL
ANION GAP: 9 mmol/L (ref 9–15)
BLOOD UREA NITROGEN: 10 mg/dL (ref 7–21)
BUN / CREAT RATIO: 12
CALCIUM: 9.5 mg/dL (ref 8.5–10.2)
CHLORIDE: 105 mmol/L (ref 98–107)
EGFR CKD-EPI AA FEMALE: >90 mL/min/{1.73_m2} (ref >=60)
EGFR CKD-EPI NON-AA FEMALE: >90 mL/min/{1.73_m2} (ref >=60)
POTASSIUM: 4.1 mmol/L (ref 3.5–5.0)
SODIUM: 140 mmol/L (ref 135–145)

## 2018-07-04 LAB — PLATELET COUNT: Lab: 259

## 2018-07-04 LAB — AST (SGOT): Aspartate aminotransferase:CCnc:Pt:Ser/Plas:Qn:: 19

## 2018-07-04 LAB — BILIRUBIN TOTAL: Bilirubin:MCnc:Pt:Ser/Plas:Qn:: 0.2

## 2018-07-04 LAB — CALCIUM: Calcium:MCnc:Pt:Ser/Plas:Qn:: 9.5

## 2018-07-04 LAB — ALT (SGPT): Alanine aminotransferase:CCnc:Pt:Ser/Plas:Qn:: 10 — ABNORMAL LOW

## 2018-07-04 LAB — THYROID STIMULATING HORMONE: Thyrotropin:ACnc:Pt:Ser/Plas:Qn:: 2.19

## 2018-07-04 MED ORDER — NORELGESTROMIN 150 MCG-E.ESTRADIOL 35 MCG/24 HR WEEKLY TRANSDERM PATCH
MEDICATED_PATCH | TRANSDERMAL | 0 refills | 0 days | Status: CP
Start: 2018-07-04 — End: 2018-09-01

## 2018-07-04 NOTE — Unmapped (Signed)
-   H/o early SAB x6 (2006, 2013-2018); all chemical pregnancies with no confirmatory [place]   - ?ectopic pregnancy in 2013 treated conservatively   - Most recent pregnancy: 06/2018 MAB s/p D&C final path pending     We reviewed the potential etiologies of recurrent pregnancy loss including maternal disease (APAS, SLE, diabetes, obesity), infection (TORCH infections such as CMV, parvo, coxsackie virus, syphilis), uterine anomalies, IUGR, placental or cord accidents, genetic abnormalities (aneuploidy, single gene disorders), luteal phase abnormality, cervical insuffiency, or unexplained.     The patient has not had a previous work up for RPL.   9/5: RPR negative, Hgb A1c 5.1%, rubella negative, varicella immune, hepatitis panel negative, APLA panel: slightly elevated B2 microglobulin     Recommended evaluation at this time include:  1) TSH  2) Evaluation of the uterine cavity with HSG, sonohysterography  3) Repeate APA, LAC, beta-2 glycoprotein testing in 12 weeks (09/2018)  4) Maternal and paternal karyotype studies

## 2018-07-04 NOTE — Unmapped (Addendum)
Morgan Hebert asked for assistance with the cost of parking. This SW provided 2 RWD parking passes to cover the cost of parking during her two appointments since she is RW eligible.      Duration of intervention: 5 minutes      Liem Copenhaver, LCSWA, CCM

## 2018-07-04 NOTE — Unmapped (Signed)
[place] [facility] Consult    Requesting provider: Self     History present of Illness   Morgan Hebert is an 32 y.o. [id number] at [redacted]w[redacted]d with Estimated Date of Delivery: 01/17/19, who is seen in consultation for evaluation of recurrent pregnancy loss.      The patient's pregnancy history is as follows:   - G1: NSVD at term (2006)   - G2-7: SAB x6 (2006, 2013-2018); all chemical pregnancies with no confirmatory [place]   - G8: ?ectopic pregnancy in 2013 treated conservatively   - G9: 06/2018 MAB s/p D&C final path pending     The patient denies h/o recurrent pregnancy loss evaluation.     The patient was diagnosed with an MAB in early September and underwent a D&E on 9/5. The patient says that she is no longer having any vaginal bleeding and denies abnormal vaginal discharge. She denies fever, or changes in bowel or bladder habits.     The patient has resumed sexual intercourse. The patient presents today for follow up of her multiple pregnancy losses. The patient has a flat affect and reports that she has no specific questions, but wants to know if she is not going to be able to have a child because she will get everything taken out instead.       Obstetrical History  OB History   Gravida Para Term Preterm AB Living   9 1 1   7 1    SAB TAB Ectopic Molar Multiple Live Births   6   1     1       # Outcome Date GA Lbr Len/2nd Weight Sex Delivery Anes PTL Lv   9 Current            8 SAB 2013           7 Ectopic 2013           6 SAB 2012           5 SAB 2012           4 SAB 2011           3 SAB 2010           2 SAB 2010           1 Term 06/28/06   2693 g (5 lb 15 oz) F Vag-Spont  N LIV       Past Medical History  Past Medical History:   Diagnosis Date   ??? Abnormal mammogram    ??? Constipation    ??? Diarrhea    ??? Environmental allergies    ??? HIV (human immunodeficiency virus infection) (CMS-HCC)    ??? HIV (human immunodeficiency virus infection) (CMS-HCC)    ??? IUD (intrauterine device) in place    ??? Migraine    ??? Obesity    ??? Pseudotumor cerebri    ??? STD (sexually transmitted disease)        Past Surgical History  Past Surgical History:   Procedure Laterality Date   ??? LUMBAR PUNCTURE     ??? PR COLONOSCOPY W/BIOPSY SINGLE/MULTIPLE  07/03/2014    Procedure: COLONOSCOPY, FLEXIBLE, PROXIMAL TO SPLENIC FLEXURE; WITH BIOPSY, SINGLE OR MULTIPLE;  Surgeon: Billie Ruddy, MD;  Location: [facility];  Service: Gastroenterology   ??? PR DILATION/CURETTAGE,DIAGNOSTIC N/A 06/14/2018    Procedure: DILATION AND CURETTAGE, DIAGNOSTIC AND/OR THERAPEUTIC (NON OBSTETRICAL);  Surgeon: Nelle Don, MD;  Location: [facility];  Service: [facility] Primary  Gynecology   ??? PR UPPER GI ENDOSCOPY,BIOPSY N/A 07/03/2014    Procedure: UGI ENDOSCOPY; WITH BIOPSY, SINGLE OR MULTIPLE;  Surgeon: Billie Ruddy, MD;  Location: [facility];  Service: Gastroenterology       Social History  Social History     Socioeconomic History   ??? Marital status: Married     Spouse name: Not on file   ??? Number of children: Not on file   ??? Years of education: Not on file   ??? Highest education level: Not on file   Occupational History   ??? Not on file   Social Needs   ??? Financial resource strain: Not on file   ??? Food insecurity:     Worry: Not on file     Inability: Not on file   ??? Transportation needs:     Medical: Not on file     Non-medical: Not on file   Tobacco Use   ??? Smoking status: Former Smoker     Types: Cigarettes   ??? Smokeless tobacco: Never Used   Substance and Sexual Activity   ??? Alcohol use: Not Currently     Alcohol/week: 0.0 standard drinks     Comment: rare   ??? Drug use: Not Currently     Types: Marijuana   ??? Sexual activity: Not Currently     Partners: Male   Lifestyle   ??? Physical activity:     Days per week: Not on file     Minutes per session: Not on file   ??? Stress: Not on file   Relationships   ??? Social connections:     Talks on phone: Not on file     Gets together: Not on file     Attends religious service: Not on file     Active member of club or organization: Not on file     Attends meetings of clubs or organizations: Not on file     Relationship status: Not on file   Other Topics Concern   ??? Not on file   Social History Narrative    Patient lives in [place], [place] with her fiance and daughter.  She is not employed.       Medications  Outpatient Medications Marked as Taking for the 07/04/18 encounter (Initial Prenatal) with Angelica Ames Coupe, MD   Medication Sig Dispense Refill   ??? bictegrav-emtricit-tenofov ala (BIKTARVY) 50-200-25 mg tablet TAKE 1 TABLET BY MOUTH ONCE DAILY 30 tablet 11   ??? diphenhydrAMINE (BENADRYL) 25 mg capsule Take 25 mg by mouth.     ??? naproxen (NAPROSYN) 500 MG tablet Take 1 tablet (500 mg total) by mouth 2 (two) times a day with meals. 60 tablet 11   ??? promethazine (PHENERGAN) 25 MG tablet Take 1 tablet (25 mg total) by mouth every eight (8) hours as needed for nausea (Nausea or headache). 90 tablet 3   ??? SUMAtriptan (IMITREX) 50 MG tablet Take 1 tablet (50 mg total) by mouth every two (2) hours as needed for migraine (Maximum 2 doses/day & 4 doses/week). 9 tablet 0       Allergies  Peanut    Review of Systems  A complete ROS was performed with pertinent positives/negatives noted in the HPI. All other systems reviewed were negative.    Objective  Temp: 36.6 ??C  Heart Rate: 97  BP: 100/73    General Appearance No acute distress, well appearing, and well nourished.   Pulmonary Normal work of breathing.  Cardiovascular Normal rate.  Edema Trace.      Gastronintestinal Abdomen soft, gravid, no rebound or guarding.   Muskuloskeletal Normal gait, grossly normal range of motion   Neurologic Alert and oriented to person, place, and time   Psychiatric  Integumentary Mood and affect within normal limits  Skin is warm, dry, no rash noted   Laboratory results from this encounter  Results for orders placed or performed in visit on 07/04/18   POCT Pregnancy Urine, Qualitative   Result Value Ref Range    HCG Urine, POC Negative Negative       Assessment and Plan:  Morgan Hebert is a 32 y.o. [id number] at [redacted]w[redacted]d who is seen in consultation regarding recurrent pregnancy loss.    HIV  - Diagnosed in 02/2014 when patient presented to the ED with adenopathy. Initial CD4 644, HIV RNA 11,000  - Patient followed closely by ID; next visit 9/25   - Last VL 05/2018 undetectable   - Current regimen: Biktarvy    Recurrent pregnancy loss in pregnant patient in first trimester, antepartum  - H/o early SAB x6 (2006, 2013-2018); all chemical pregnancies with no confirmatory [place]   - ?ectopic pregnancy in 2013 treated conservatively   - Most recent pregnancy: 06/2018 MAB s/p D&C final path pending     We reviewed the potential etiologies of recurrent pregnancy loss including maternal disease (APAS, SLE, diabetes, obesity), infection (TORCH infections such as CMV, parvo, coxsackie virus, syphilis), uterine anomalies, IUGR, placental or cord accidents, genetic abnormalities (aneuploidy, single gene disorders), luteal phase abnormality, cervical insuffiency, or unexplained.     The patient has not had a previous work up for RPL.   9/5: RPR negative, Hgb A1c 5.1%, rubella negative, varicella immune, hepatitis panel negative, APLA panel: slightly elevated B2 microglobulin     Recommended evaluation at this time include:  1) TSH  2) Evaluation of the uterine cavity with HSG, sonohysterography  3) Repeate APA, LAC, beta-2 glycoprotein testing in 12 weeks (09/2018)  4) Maternal and paternal karyotype studies     Mood:   - Patient did not show any signs of SI/HI and reports that she is fine, however, when asked about further questions during this visit, the patient also asked if it is OK to take someone's baby. Upon discussion of this, the patient said that she is kidding, but feels very frustrated and sad when she sees other women with babies because she feels that they may not be a good mother.     General preconception recommendations:  - Recommend a daily prenatal vitamin with at least 400 mcg daily of folic acid, starting 2-3 months prior to attempting pregnancy.  - In addition recommend diet and exercise to work to maintain goal of normal BMI prior to conception.    I spent 45 minutes in face-to-face communication with Morgan Hebert, greater than 50% of which was in counseling and education as documented above.       This patient was discussed in detail with Dr. Durwin Glaze, who is in agreement with the above recommendations.    Thank you for allowing me to participate in the care of your patient.     Sincerely,     Hermina Barters, M.D. - Clinical Instructor & Fellow  [facility] Department of Obstetrics & Gynecology??- Division of Maternal-Fetal Medicine  Pager: [phone]

## 2018-07-04 NOTE — Unmapped (Signed)
Assessment/Plan:      Morgan Hebert, a 32 y.o. female seen today for routine HIV followup.    Plan:  HIV  Reinforced need to take medication daily regardless of what is happening in her life. Fills ART via private insurance. Due for HMAP renewal.  ?? Continue current therapy. E-prescribed at recent visit, no refills needed.   ?? Checking HIV RNA & safety labs (brief return)    ?? Encouraged continued excellent ARV adherence.  Lab Results   Component Value Date    ACD4 1,696 (A) 05/01/2015    CD4 45 06/23/2014    HIVCP detected 10/23/2017    HIVRS Not Detected 05/25/2018   Repeat VL via [facility] 03/28/18 - 270 copies/mL  Discussed decline in CD4 count over past year from 1300-->900 copies with associated decrease in % and what this means in terms of reflecting immunologic damage from being off ART.    Pseudotumor cerebri  Working with neurologists to settle on best regimen for her  For ophtho followup.    Sexual health & secondary prevention  Sex with female partner (husband). Monogamous with single partner. She does disclose status. Never uses condoms.    Lab Results   Component Value Date    RPR Nonreactive 05/30/2018    LABRPR NON-REACTIVE 02/13/2014    CTNAA Negative 03/01/2017    CTNAA Negative 12/02/2015    CTNAA Negative 02/11/2015    GCNAA Negative 03/01/2017    GCNAA Negative 12/02/2015    GCNAA Negative 02/11/2015    SPECTYPE Urine 03/01/2017    SPECTYPE Urine 12/02/2015    SPECTYPE Urine 02/11/2015    SPECSOURCE Urine 03/01/2017    SPECSOURCE Urine 12/02/2015     ?? GC/CT NAATs -- needed but deferred to future visit (declined today)  ?? RPR -- needed but deferred to future visit  (declined today)    Health maintenance  Lab Results   Component Value Date    CREATININE 0.99 03/28/2018    QFTTBGOLD NEGATIVE 02/13/2014    HEPCAB Nonreactive 03/01/2017    CHOL 140 03/28/2018    HDL 42 03/28/2018    LDL 78 03/28/2018    NONHDL 98 03/28/2018    TRIG 99 03/28/2018    A1C 5.2 03/28/2018    PAP Negative for intraephithelial lesion or malignancy 03/29/2017    FINALDX  06/14/2018     A: Products of conception, removal  -Immature and hydropic chorionic villi, inflamed gestational endometrium and decidua with necrosis, most  consistent with hydropic abortus (see comment)  - No diagnostic gestational trophoblastic disease identified (correlate with molecular studies)    This electronic signature is attestation that the pathologist personally reviewed the submitted material(s) and the final diagnosis reflects that evaluation.       Communicable diseases  # TB - no longer needed; negative IGRA 2015 and low/no risk  # HCV - negative 2018; rescreen w/Ab q1-2y    Cancer screening  # Anorectal - not yet done  # Colorectal - SCREEN AGE 17+  # Liver - not yet done  # Lung - not applicable    # Breast - SCREEN AGE 51+ -- Q1-2Y  # Cervical - neg cyto June 2018 through [facility]. Due for repeat June 2020.    Cardiovascular disease  # The ASCVD Risk score Denman George DC Jorge Ny al., 2013) failed to calculate.    Immunization History   Administered Date(s) Administered   ??? Influenza Vaccine Quad (IIV4 PF) 35mo+ injectable 06/30/2015, 06/15/2016     ??  Screening ordered today: none  ?? Immunizations ordered today: none    Counseling services took more than 50% of today's visit time.  Counseled as documented above regarding medication adherence.    Disposition  Return to clinic 3-4 months or sooner if needed.    [name], MD, MPH   [facility]   [address] 1st floor   [place], [place]. [address]   Phone: [phone]   Fax: [phone]        Subjective:      Chief Complaint   HIV followup    HPI  Return patient visit for Morgan Hebert, a 32 y.o. female.   Had pregnancy loss earlier this month as documented, saw OB today. 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  Lots of stress with her job, potentially being fired because of FMLA.  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  Emotionally recovering from loss but still has a hard time.    Past Medical History:   Diagnosis Date   ??? Abnormal mammogram    ??? Constipation    ??? Diarrhea    ??? Environmental allergies    ??? HIV (human immunodeficiency virus infection) (CMS-HCC)    ??? HIV (human immunodeficiency virus infection) (CMS-HCC)    ??? IUD (intrauterine device) in place    ??? Migraine    ??? Obesity    ??? Pseudotumor cerebri    ??? STD (sexually transmitted disease)      Medications and Allergies   Reviewed and updated today. See bottom of this visit's encounter summary for details.  Current Outpatient Medications on File Prior to Visit   Medication Sig   ??? bictegrav-emtricit-tenofov ala (BIKTARVY) 50-200-25 mg tablet TAKE 1 TABLET BY MOUTH ONCE DAILY   ??? diphenhydrAMINE (BENADRYL) 25 mg capsule Take 25 mg by mouth.   ??? furosemide (LASIX) 20 MG tablet Take 1 tablet (20 mg total) by mouth daily. for 12 days   ??? naproxen (NAPROSYN) 500 MG tablet Take 1 tablet (500 mg total) by mouth 2 (two) times a day with meals.   ??? norelgestromin-ethinyl estradiol (ORTHO EVRA) 150-35 mcg/24 hr Place 1 patch on the skin every seven (7) days.   ??? promethazine (PHENERGAN) 25 MG tablet Take 1 tablet (25 mg total) by mouth every eight (8) hours as needed for nausea (Nausea or headache).   ??? SUMAtriptan (IMITREX) 50 MG tablet Take 1 tablet (50 mg total) by mouth every two (2) hours as needed for migraine (Maximum 2 doses/day & 4 doses/week).     No current facility-administered medications on file prior to visit.        Allergies   Allergen Reactions   ??? 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     Social History  General ??? lives in [place] with her husband and her 1 daughter (born 2008). There is financial tension.   ?? Doreene Adas and his girlfriend are there sometimes as well.   ?? Mom and sister are both positive (sister is perinatally infected, patient with presumed sexual transmission from female partner).  ?? 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  Sexual History ??? sex with men (husband only)  Substance Use ??? marijuana (smokes nightly)  Social History     Tobacco Use   ??? Smoking status: Former Smoker     Types: Cigarettes   ??? Smokeless tobacco: Never  Used   Substance Use Topics   ??? Alcohol use: Not Currently     Alcohol/week: 0.0 standard drinks     Comment: rare       Review of Systems  As per HPI. Remainder of 10 systems reviewed, negative.        Objective:      BP 105/70  - Pulse 113  - Temp 36.8 ??C (98.3 ??F) (Oral)  - Ht 149.9 cm (4' 11.02)  - Wt 74.4 kg (164 lb)  - LMP 04/12/2018  - BMI 33.11 kg/m??   Wt Readings from Last 3 Encounters:   07/04/18 74.4 kg (164 lb)   06/14/18 72 kg (158 lb 12.8 oz)   05/30/18 73 kg (160 lb 14.4 oz)     Const looks well and attentive, alert, appropriate   Eyes sclerae anicteric, noninjected OU   ENT dentition good   Lymph no cervical or supraclavicular LAD   CV RRR. No murmurs. No rub or gallop. S1/S2.   Resp CTAB ant/post, normal work of breathing   GI Soft, no organomegaly. NTND. NABS.   GU deferred   Rectal deferred   Skin no petechiae, ecchymoses or obvious rashes on clothed exam   MSK full exam deferred   Neuro CN II-XII grossly intact, MAEE, non focal   Psych Appropriate affect. Eye contact good. Linear thoughts. Fluent speech.     Laboratory Data  Reviewed in Epic today, using Synopsis and Chart Review filters.    Lab Results   Component Value Date    CREATININE 0.99 03/28/2018    QFTTBGOLD NEGATIVE 02/13/2014    HEPCAB Nonreactive 03/01/2017    CHOL 140 03/28/2018    HDL 42 03/28/2018    LDL 78 03/28/2018    NONHDL 98 03/28/2018    TRIG 99 03/28/2018    A1C 5.2 03/28/2018    PAP Negative for intraephithelial lesion or malignancy 03/29/2017    FINALDX  06/14/2018     A: Products of conception, removal  -Immature and hydropic chorionic villi, inflamed gestational endometrium and decidua with necrosis, most  consistent with hydropic abortus (see comment)  - No diagnostic gestational trophoblastic disease identified (correlate with molecular studies)    This electronic signature is attestation that the pathologist personally reviewed the submitted material(s) and the final diagnosis reflects that evaluation.                  _____________________________________________________________________

## 2018-07-04 NOTE — Unmapped (Signed)
-   Diagnosed in 02/2014 when patient presented to the ED with adenopathy. Initial CD4 644, HIV RNA 11,000  - Patient followed closely by ID; next visit 9/25   - Last VL 05/2018 undetectable   - Current regimen: [employer]

## 2018-07-04 NOTE — Unmapped (Signed)
Addended byArmandina Gemma on: 07/04/2018 01:52 PM     Modules accepted: Orders

## 2018-07-04 NOTE — Unmapped (Signed)
Addended by: Charlesetta Ivory on: 07/04/2018 02:01 PM     Modules accepted: Orders

## 2018-07-04 NOTE — Unmapped (Signed)
It was great to see you today.    Contacting us   During working hours  [phone]  After hours or weekends [phone] and ask for the ID doctor on call  Fax number   [phone]    MEDICATIONS  For refills, please contact your pharmacy and ask them to electronically send or fax the request to the clinic.     Please bring all medications in original bottles to every appointment.    HMAP (formerly ADAP) or [employer] Eligibility (required even if you do not receive medication through [facility])  Please remember to renew your Juanell Fairly eligibility during renewal periods which occur twice a year: January-March and July-September.     The following are needed for each renewal:   - [facility] Identification (if you don't have one, then a bill with your name and address in [place])   - proof of income (award letter, W-2, or last three check stubs)   If you are unable to come in for renewal, let us know if we can mail, fax or e-mail paperwork to you.   HMAP Contact: [phone]      Urgent Care Clinic  Monday, Tuesday, and Thursday from 8:30 - 12 noon  Please call ahead to speak with the nursing staff if you think you need to be seen urgently!    Lab info:  Your most recent CD4 T-cell counts and viral loads are below. Here are a few things to keep in mind when looking at your numbers:  ?? For most people, we're checking CD4 counts fairly infrequently (once a year or less)  ?? It's normal for your CD4 count to be different from visit to visit.   ?? We consider your viral load to be undetectable if it says <40 or if it says Not detected.  ?? Our goal is to get your virus to be undetectable and keep it undetectable. You can help by taking your medications at about the same time, every single day. If you're having trouble with taking your medications, it's important to let us know.    YOUR RECENT LAB RESULTS:  CD4 and VIRAL LOAD  Lab Results   Component Value Date    ACD4 1,696 (A) 05/01/2015    HIVRS Not Detected 05/25/2018

## 2018-07-05 MED FILL — BIKTARVY 50 MG-200 MG-25 MG TABLET: ORAL | 30 days supply | Qty: 30 | Fill #1

## 2018-07-05 MED FILL — BIKTARVY 50 MG-200 MG-25 MG TABLET: 30 days supply | Qty: 30 | Fill #1 | Status: AC

## 2018-07-10 LAB — HIV RNA, QUANTITATIVE, PCR: HIV RNA: <40 {copies}/mL — ABNORMAL HIGH (ref <0)

## 2018-07-10 LAB — HIV RNA: HIV 1 RNA:NCnc:Pt:Ser/Plas:Qn:Probe.amp.tar: <40 — ABNORMAL HIGH

## 2018-07-11 NOTE — Unmapped (Signed)
Received message from [facility], pt's pharmacy. She was calling for clarification of patches ordered. Called back and left message advising MD would be contacted. Call back number is [phone].

## 2018-07-17 NOTE — Unmapped (Signed)
Spoke with pharmacy to clarify instructions on birth control prescription. A generic message was left for the patient to follow up as needed.

## 2018-07-24 ENCOUNTER — Encounter: Admit: 2018-07-24 | Discharge: 2018-07-24 | Payer: BLUE CROSS/BLUE SHIELD

## 2018-07-24 DIAGNOSIS — Z01812 Encounter for preprocedural laboratory examination: Secondary | ICD-10-CM

## 2018-07-24 DIAGNOSIS — N96 Recurrent pregnancy loss: Principal | ICD-10-CM

## 2018-07-24 LAB — PREGNANCY TEST URINE: Lab: NEGATIVE

## 2018-08-02 MED ORDER — BICTEGRAVIR 50 MG-EMTRICITABINE 200 MG-TENOFOVIR ALAFENAM 25 MG TABLET
ORAL_TABLET | Freq: Every day | ORAL | 11 refills | 0 days | Status: CP
Start: 2018-08-02 — End: 2018-10-31
  Filled 2018-08-06: qty 30, 30d supply, fill #0

## 2018-08-02 NOTE — Unmapped (Signed)
Pt not sure how much she has on hand- was ok with delivery 10/29    [facility] Refill Coordination Note    Specialty Medication(s) to be Shipped:   Infectious Disease: Biktarvy    Other medication(s) to be shipped: n/a     Morgan Hebert, DOB: Jul 08, 1986  Phone: [phone] (home)       All above HIPAA information was verified with patient.     Completed refill call assessment today to schedule patient's medication shipment from the [facility] [phone]).       Specialty medication(s) and dose(s) confirmed: Regimen is correct and unchanged.   Changes to medications: Morgan Hebert reports no changes reported at this time.  Changes to insurance: No  Questions for the pharmacist: No    The patient will receive a drug information handout for each medication shipped and additional FDA Medication Guides as required.      DISEASE/MEDICATION-SPECIFIC INFORMATION        N/A    ADHERENCE        No missed doses reported at this time      [facility]     Shipping address confirmed in Epic.     Delivery Scheduled: Yes, Expected medication delivery date: 10/29.  However, Rx request for refills was sent to the provider as there are none remaining.     Medication will be delivered via Next Day Courier to the home address in Epic WAM.    Morgan Hebert   [facility] Specialty Technician

## 2018-08-06 MED FILL — BIKTARVY 50 MG-200 MG-25 MG TABLET: 30 days supply | Qty: 30 | Fill #0 | Status: AC

## 2018-08-09 NOTE — Unmapped (Signed)
Spoke with Cellie and inform her that we have received her FMLA form.  Also informed and sent Carys a Medical Records Authorization form for her to return.

## 2018-08-09 NOTE — Unmapped (Signed)
FMLA paperwork received put in mailbox for Dr. Glynn Octave.  Will call patient to notify forms received.

## 2018-08-13 IMAGING — CR DG CHEST 2V
2 series · 2 of 2 positions shown · non-contrast
Comparison: Chest x-ray dated August 04, 2015.

CLINICAL DATA: Leg swelling.

EXAM:
CHEST  2 VIEW

[chest pa]
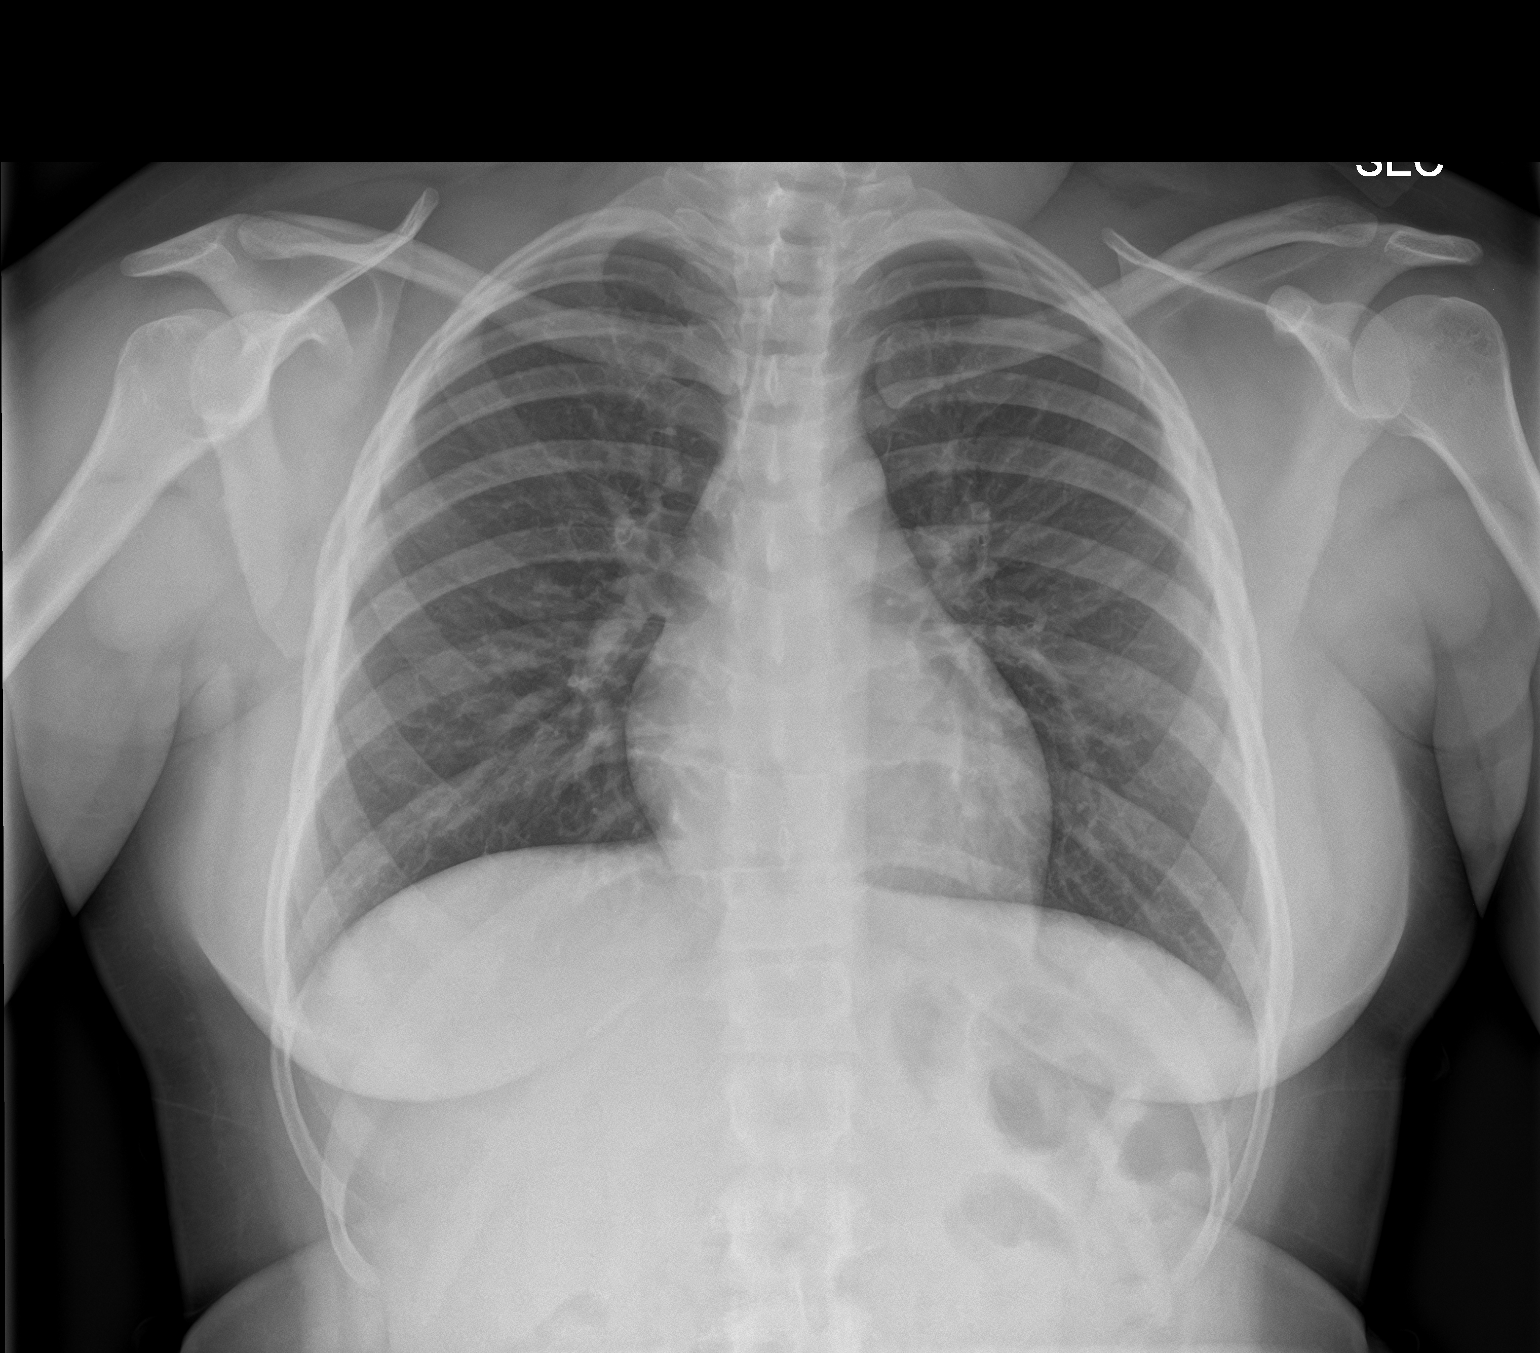

[chest lat]
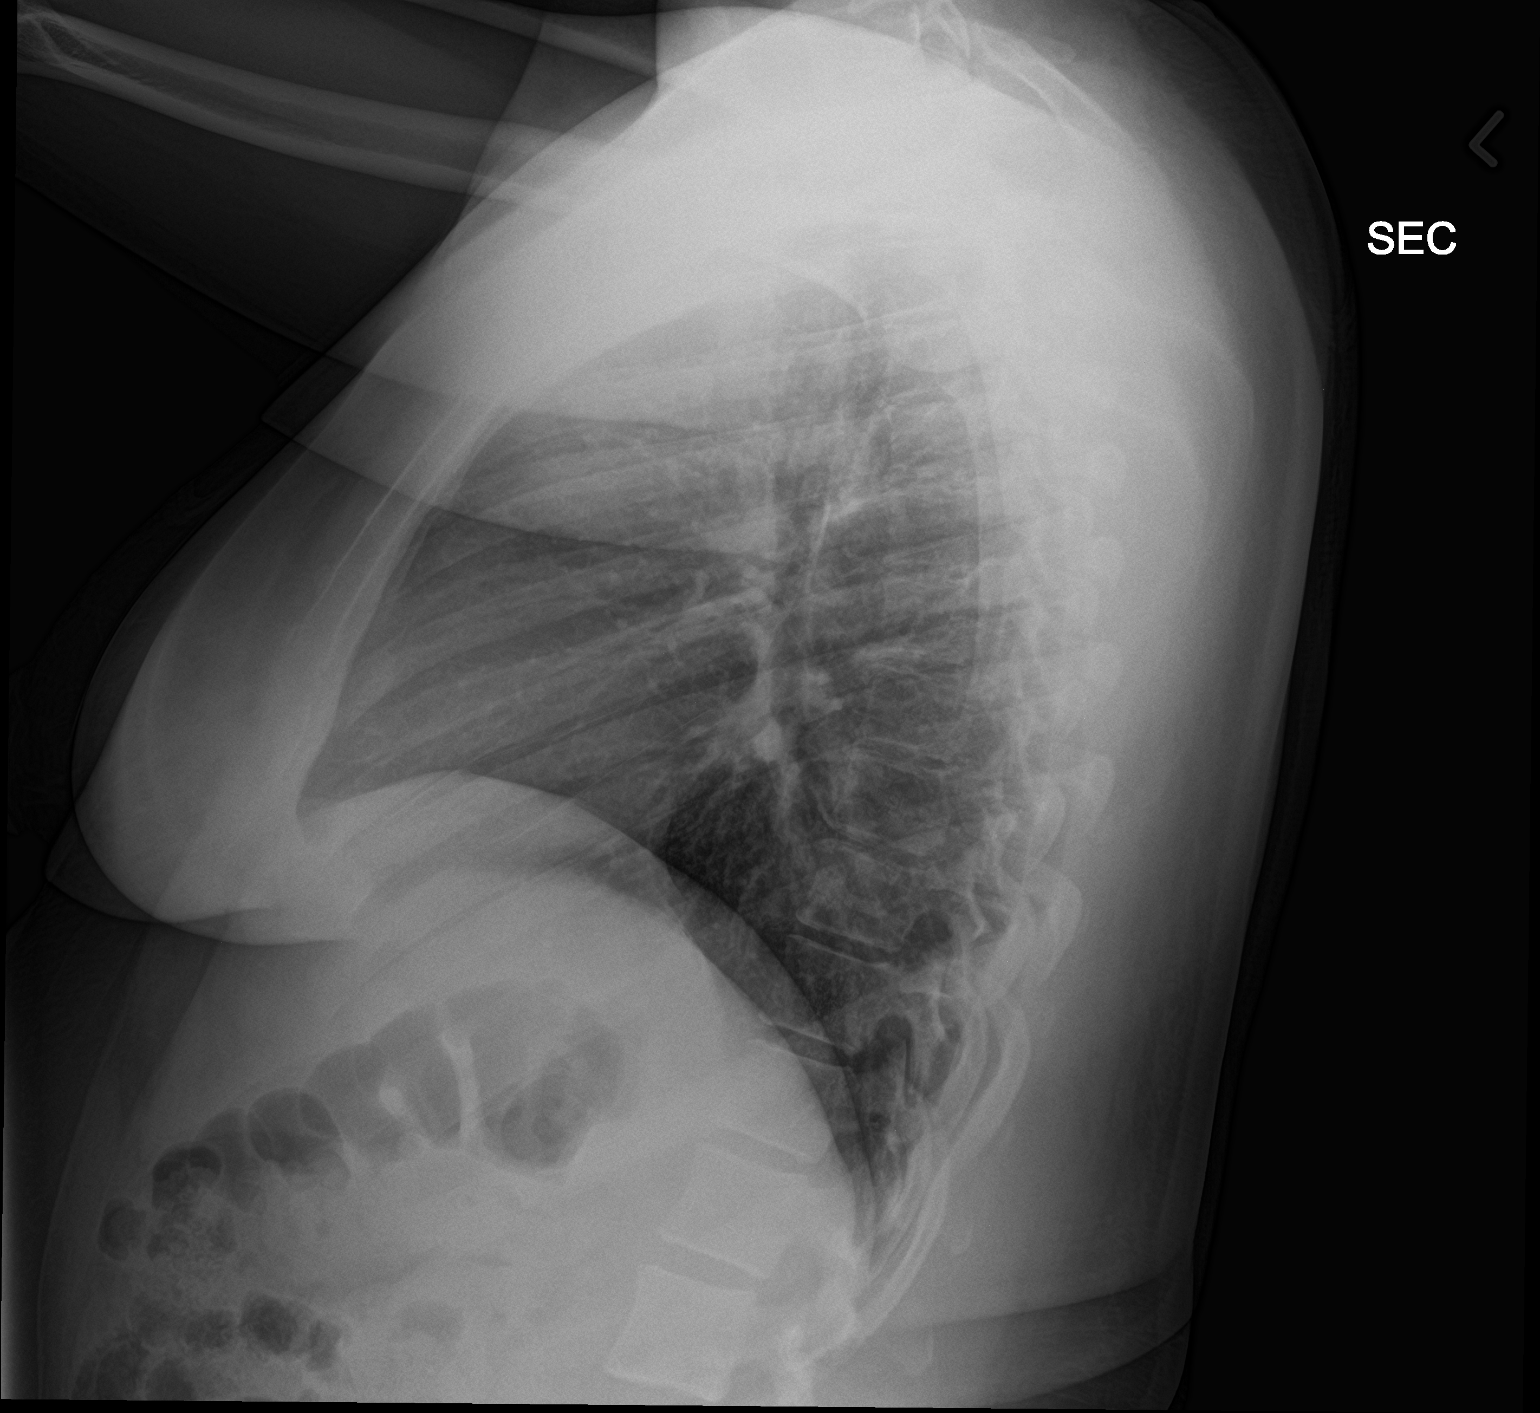

[2 of 2 positions shown; findings below may reference images not displayed]

FINDINGS: Listen The heart size and mediastinal contours are within normal
limits. Both lungs are clear. The visualized skeletal structures are
unremarkable.
IMPRESSION: No active cardiopulmonary disease.

## 2018-08-15 IMAGING — CR DG CHEST 2V
1 series · 2 of 2 positions shown · non-contrast
Comparison: 05/04/2017

CLINICAL DATA: Bilateral leg swelling.

EXAM:
CHEST  2 VIEW

[Series 1: dg chest 2 view · 0.14mm/px · 2 of 2 slices shown]
[im 1/2]
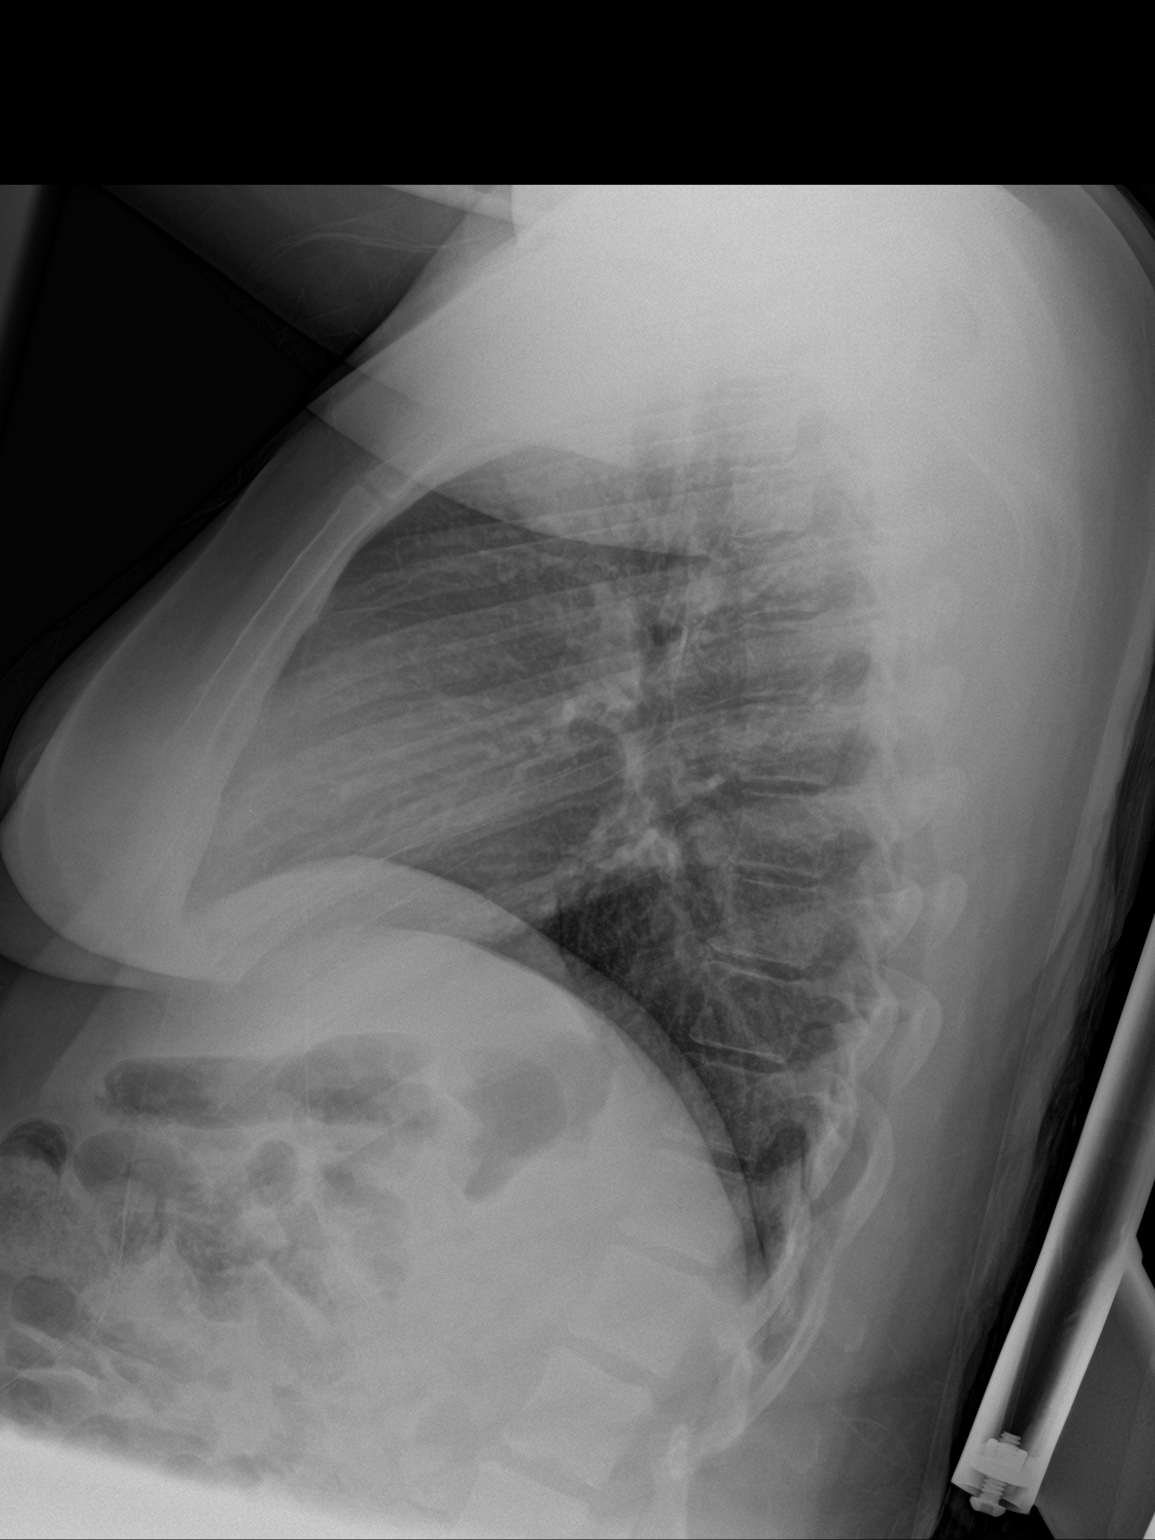
[im 2/2]
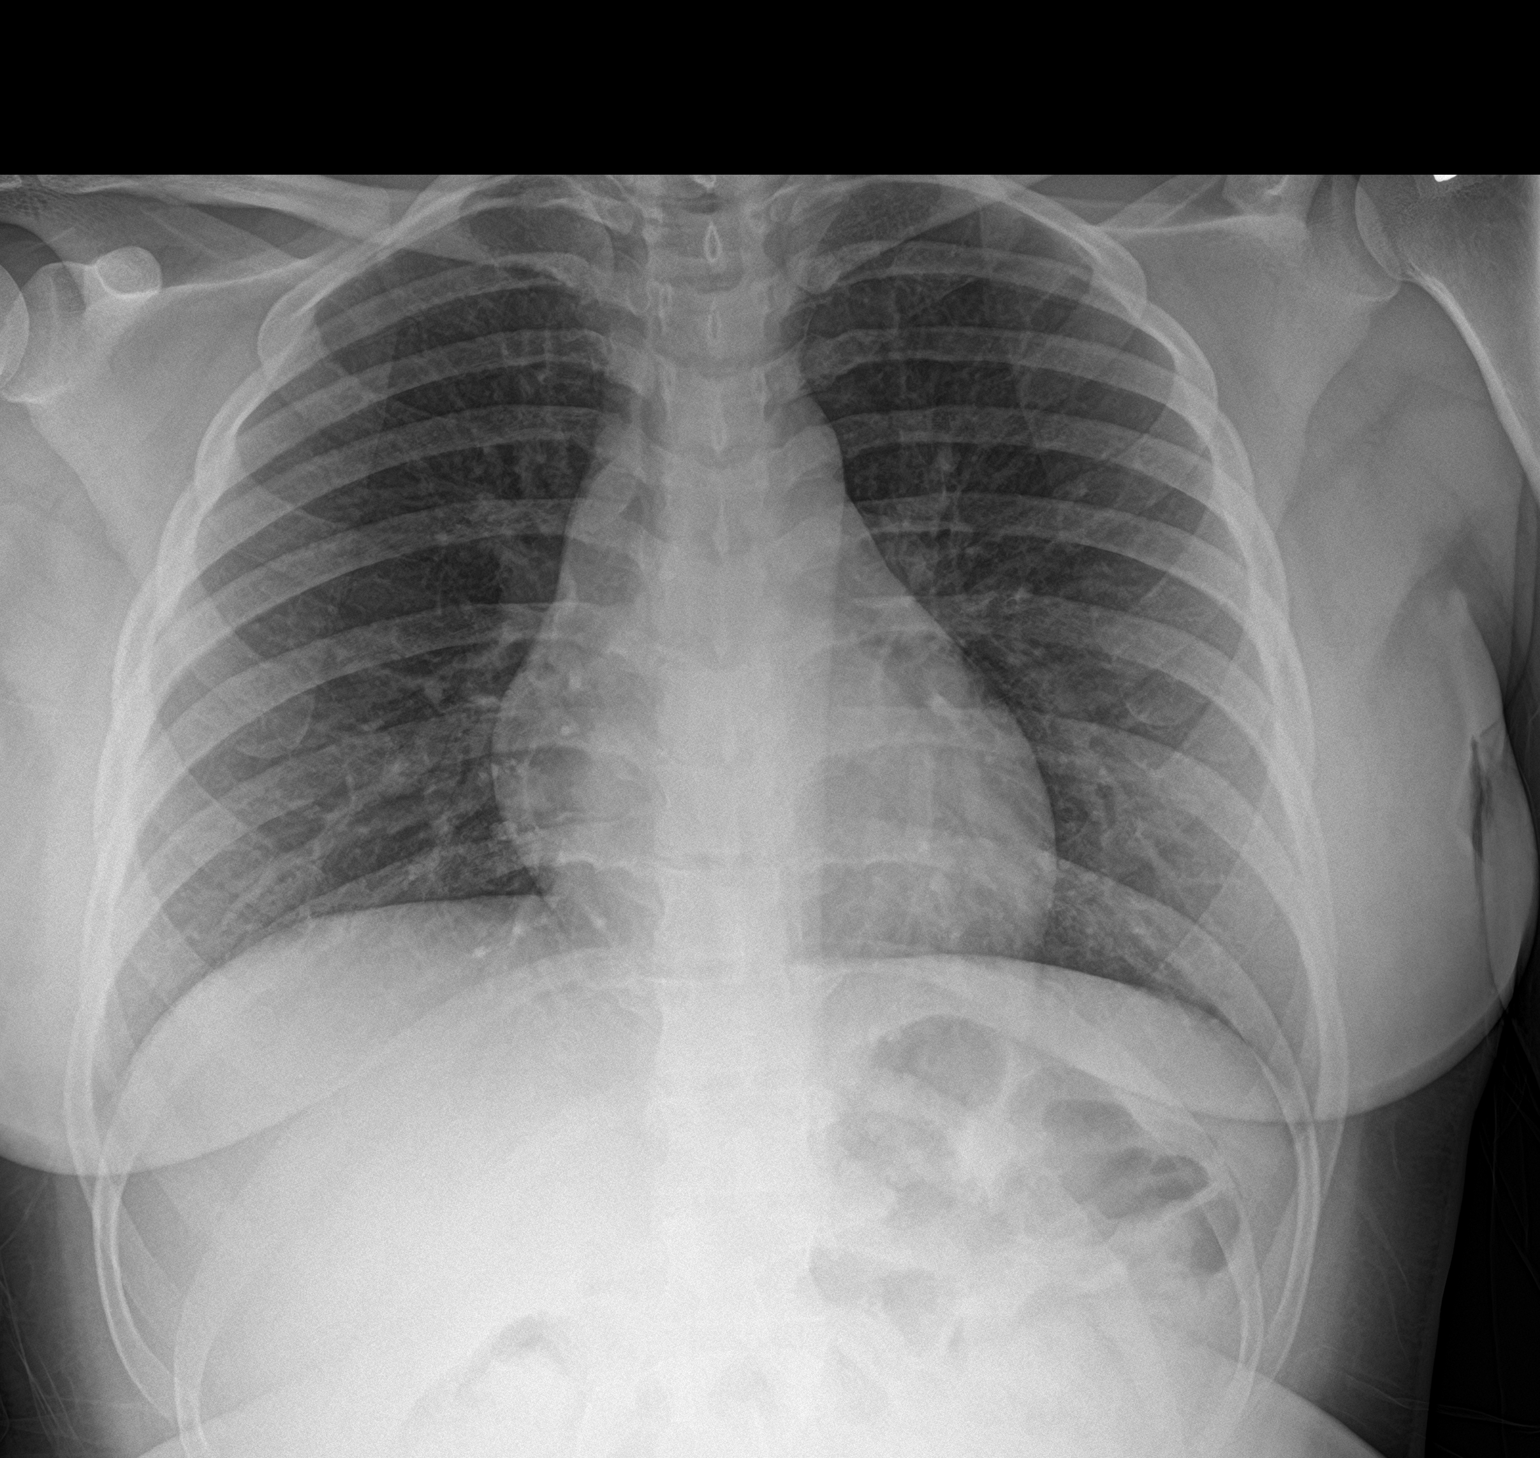

[2 of 2 positions shown; findings below may reference images not displayed]

FINDINGS: Heart and mediastinal contours are within normal limits. No focal
opacities or effusions. No acute bony abnormality.
IMPRESSION: No active cardiopulmonary disease.

## 2018-08-20 ENCOUNTER — Telehealth: Payer: Self-pay | Admitting: Obstetrics and Gynecology

## 2018-08-20 NOTE — Telephone Encounter (Signed)
The patient has been scheduled for pregnancy confirmation 08/27/18, I scheduled the patient sooner than 7 weeks due to her expressing her concerns and the fact that she has had 8 miscarriages. No other information was disclosed. Please advise.

## 2018-08-22 ENCOUNTER — Ambulatory Visit: Admit: 2018-08-22 | Discharge: 2018-08-23 | Payer: BLUE CROSS/BLUE SHIELD

## 2018-08-22 DIAGNOSIS — G932 Benign intracranial hypertension: Principal | ICD-10-CM

## 2018-08-22 NOTE — Unmapped (Signed)
Neurology Follow-up Visit Note     [facility] [address]  [facility] NEUROLOGY CLINIC [place] [address] [place] [address] [place]  [address]  [place] [address]  [phone]    Date: 08/22/2018   Patient Name: Morgan Hebert   MRN: [id number]   PCP: No PCP Per Patient  Referring Provider: Pcp, None Per Patient       Assessment and Plan          Morgan Hebert is a 32 y.o. female with a past medical history of pseudotumor cerebri, multifactorial headaches, chronic GI complaints (possibly of functional nature), and HIV (on Biktarvy, VL 240 in June 2019) presenting for follow-up of her multifactorial headaches and pseudotumor cerebri.     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 Patient learned that she was pregnant on August 6th, and discontinued both Zonisamide and Diamox soon thereafter on the advice of her OBGYN (who is OK with Diamox being restarted in the second trimester). S/p bilateral occipital nerve block 05/30/2018, from which she reports minimal improvement. She miscarried and underwent dilation & suction evacuation on 06/14/2018, after which she restarted her usual Diamox. She has just had a positive pregnancy test this past weekend (last period October 5th), and has since discontinued all medication except promethazine and sumitirptan. Over the past 2 months her usual headache has been stable in nature She gets some relief from both her sumatriptan and promethazine, but is limited in the use of the former to 4 doses per week and has a limited supply of the latter, which she saves for bedtime due to her subsequent drowsiness. She endorses some bumping into objects on her R hand side of late, but has no clear visual field deficits on confrontation testing. Exam significant for stable bilateral Grade I-II papilledema bilaterally and visual fields fully intact to confrontation (though these will need formal evaluation by ophthalmology in the absence of her usual IIH therapies).   - Hold Diamox until 2nd trimester per OBYGN  - Can use Tylenol 1g q8h PRN, promethazine 25mg  q8h PRN, and sumatriptan 50mg  q2h PRN as abortives  - Advised to try to limit stressors, maintain hydration & regular sleep schedule, limit screen time, and limit prolonged neck flexion   - Patient to re-schedule missed appointment with [facility] Ophthalmology for perimetry and retinal evaluation  [facility] Clinic follow-up in 3-6 months.     Reviewed the differential diagnosis, plan of care in detail, as well as other elements as detailed above.  A written summary was provided to the patient, including a current medication and allergy list, problem list and plan of care. In addition, my contact information was provided to the patient, in writing.  All the patient's questions and concerns were addressed to their stated satisfaction .      Return Visit Discussed: Return for Follow Up in 3-6 Months.    Greater than 50% of today's visit lasting greater than 25 minutes (96295) was dedicated to face-to-face counseling on the points above.    This patient was discussed with Dr. Champ Mungo who agrees with the above assessment and plan.       Mindi Curling, MBBS  PGY-2 Neurology    *Patient note was created using Dragon Dictation.  Any  errors in syntax or even information may not have been identified and edited on initial review prior to signing this note.         HPI         HPI: Morgan Hebert is a 32 y.o. female with a past medical history of pseudotumor cerebri, multifactorial headaches, chronic GI complaints (possibly of functional nature), and HIV (on Biktarvy, VL 240 in June 2019) presenting for follow-up of her multifactorial headaches and pseudotumor cerebri. Patient was last seen by [facility] Neurology on 05/02/2018.        To review their relevant history, To review her relevant headache history, she initially presented to [facility] ED in 11/2013 with 4 months of headache which were described as initially intermittent with increase in frequency and later becoming continuous. Headaches were right-sided and at times associated with photophobia, phonophobia, nausea with occasional emesis, vertigo, and right ear tinnitus. She also complained of bilateral blurry vision and double vision. She had not taken oral OCPs, vitamin A, or tetracyclines. Fundoscopic examination demonstrated mild disc edema bilaterally. She underwent MRI brain which showed no abnormalities and lumbar puncture which showed normal CSF and elevated opening pressure of 42 cm H2O. ??She was started on acetazolamide 500 mg twice daily for pseudotumor cerebri; later this was increased to 1000 mg bid for improved symptoms control and improved papilledema. She later became asymptomatic allowing for the discontinuation of acetazolamide 1000 mg twice daily. In 01/2014 due to continued migraines, she was started on prophylaxis with propranolol, which has subsequently been discontinued due to hypotension. ??In 2016, she endorsed a 30 pound weight gain accompanied by increased frequency of headache. ??Her headache characteristics had changed at that time with a near continuous global headache upon which was superimposed periods of daily severe pressure behind her eyes and in her neck and shoulders. ??At that time she was using promethazine and naproxen for daily abortive treatment with success. ??In early 2017, she was restarted on Diamox up to 1500 mg twice daily but was receiving infrequent ophthalmologic care. Fortunately she was seen on 12/01/2015 and found to have bilateral retinal thinning temporally as well as decreased visual fields on testing in the OS nasal field. Repeat exam in April 2017 demonstrated some improvement of her OS visual fields.  ??  Since that time, patient has intermittently been seen in the emergency room for headache and has been tried on several medications prescribed by other providers. ??This has included desipramine (caused significant sedation),??indomethacin (caused significant abdominal pain), and amitriptyline (cause sedation). ??Of note, the TCAs were started in the setting of concurrent Phenergan and Bentyl use which may have worsened the sedating side effects. ??She is also been evaluated with CPAP in 2017 which did not demonstrate any OSA. ??At that time her husband reported snoring without periods of apnea. ??This is been stable per him. 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 ??However in the past, her ophthalmology exams??have been stable to improving. ??We have discussed several times that surgical intervention may not alter her headache syndrome. ??Rather this is designed to preserve her vision.  ??  In late March 2019, the patient reports that she was seen by a local optometrist who reported to her that she had high pressure in her eyes. ??At that time she had been on Diamox 1000 mg twice daily. ??Over the phone, high increase this to 1500 mg twice daily and recommend that she get an LP in the  emergency department. ??Unfortunately, the LP was unsuccessful and caused back pain. ??She reports that since earlier this year, she has had nearly daily headache that is described as starting in the posterior region and becoming global. ??There is then an intense pressure behind her eyes. ??She reports some mild photophobia and phonophobia as well as intermittent nausea and vomiting. ??She also endorses some mild to moderate tinnitus. ??Since her last evaluation, she has had significant changes in her medication regimen. ??She is no longer taking her GI cocktails. ??She is not taking any migraine prophylaxis. ??She takes ibuprofen 800 mg 3-4 times daily with mild to moderate headache relief. ??She is not followed regularly by an ophthalmologist. ??She does report losing a few pounds in the last few weeks as she is watching her diet more carefully. ??Her current medication regimen includes her Diamox 1500 mg twice daily, her HIV antiviral, ibuprofen as above, and Benadryl 25 mg nightly for allergies. ??She takes this year-round. ??She denies any change in her vision but reports that the optometrist gave her a stronger prescription. ??She continues to endorse good follow-up with her infectious disease provider. ??She is tolerating the Diamox well with mild tingling paresthesias which she has gotten used to..    As of July 2019 she reports no improvement, and possibly some worsening, in frequency and intensity of her chronic daily headache since last in neurology clinic in May 2019, at which point she was initiated on zonisamide 50mg  qd and uptitrated to Diamox 2g BID. She was also referred to [facility] ophthalmology for assessment of her visual fields and retinal testing, but was never contacted by the clinic. She describes daily headaches, worsened by heat, light, and certain smells that originate at the back of her neck and radiate over the top of her head bilaterally to her eyes, at which point she may experience associated ringing in her ears, nausea & vomiting, double vision, photophobia, and phonophobia. She feels these headache range from 5-9/10 in intensity from day to day, , R > L, and are characterized by a continuous, sharp pain that she finds debilitating on approximately half of all days. She has declined to use abortive medications like Imitrex and promethazine due to their sedating effects, and she prefers alternating between ibuprofen and naproxen, which both give her partial relief. Of note, the nausea & vomiting which she describes in the context of her headaches has limited her appetite, and she reported losing approximately 20 pounds since her last clinic visit in May, with associated improvement in her snoring. Given a component of occipital neuralgia contributing to her multifactorial headache, planned for trial of 2-3 week trial of cold compresses to bilateral occipital grooves for BID.    As of 05/11/2018 she reports a worsening of her chronic daily headache to a continuous pain that limits her ability to work 2-3 days each week. She reports good compliance with the planned trial of cold compresses to her occipital nerve grooves anf her zonisamide 50mg  qd. She is only using ibuprofen & naproxen as abortive medications. Her pain still originates in her posterior shoulder girdle, radiating over her head and behind her eyes to create a throbbing pain associated with nausea, vomiting, intermittent blurring of vision & double vision, phonophobia, and photophobia. She feels this headache is slightly increased in its continuity and intensity from last week, prompting her to return to clinic after such a short interval.    On 05/30/2018 she reports learning that she was pregnant on August 6th, and  discontinued both Zonisamide and Diamox soon thereafter on the advice of her OBGYN (who is OK with Diamox being restarted in the second trimester). She is [redacted] weeks pregnant as of 05/31/2018. Since then her usual headache have become more severe in nature due to the contraindication of her usual abortive medication, ibuprofen. She gets some relief from both her sumatriptan and promethazine, but is limited in the use of the former to 4 doses per week and has a limited supply of the latter, which she saves for bedtime to be able to sleep. She denies changes in her vision, visual fields or visual acuity.    Interval History: She miscarried in early September and underwent removal of products of conception on 9/5, after which she resumed her usual Diamox and Zonisamide. She feels there was little change in her headaches through this period, which continue to occur on a daily basis, and are often debilitating, causing her to miss multiple days of work per week. She defers abortive therapies until after work as they make her sleepy, with the exception of Ibuprofen. The headache continue to originate in her posterior shoulder girdle, R > L, with sharp & shooting pains radiating to her forehead bilaterally. These are worsened by excessive screen time, and often require her to lie down in a dark, quiet room to achieve relief. She feels stressors at home, in the form of childcare, are also contributing to her headaches. She also recently (this past weekend) discovered that she was newly pregnant, and has ceased all medications except promethazine and sumatriptan; she has avoided Tylenol despite repeated assurances that it is safe in pregnancy. She notes no significant differences in her headache severity, frequency, or duration going on or off the medications described above.     Allergies   Allergen Reactions   ??? 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        Current Outpatient Medications   Medication Sig Dispense Refill   ??? bictegrav-emtricit-tenofov ala (BIKTARVY) 50-200-25 mg tablet Take 1 tablet by mouth daily. 30 tablet 11   ??? diphenhydrAMINE (BENADRYL) 25 mg capsule Take 25 mg by mouth.     ??? naproxen (NAPROSYN) 500 MG tablet Take 1 tablet (500 mg total) by mouth 2 (two) times a day with meals. 60 tablet 11   ??? promethazine (PHENERGAN) 25 MG tablet Take 1 tablet (25 mg total) by mouth every eight (8) hours as needed for nausea (Nausea or headache). 90 tablet 3   ??? SUMAtriptan (IMITREX) 50 MG tablet Take 1 tablet (50 mg total) by mouth every two (2) hours as needed for migraine (Maximum 2 doses/day & 4 doses/week). 9 tablet 0   ??? furosemide (LASIX) 20 MG tablet Take 1 tablet (20 mg total) by mouth daily. for 12 days 12 tablet 0   ??? norelgestromin-ethinyl estradiol (ORTHO EVRA) 150-35 mcg/24 hr Place 1 patch on the skin every seven (7) days. (Patient not taking: Reported on 07/04/2018) 17 patch 0     No current facility-administered medications for this visit.        Past Medical History:   Diagnosis Date   ??? Abnormal mammogram    ??? Constipation    ??? Diarrhea    ??? Environmental allergies    ??? HIV (human immunodeficiency virus infection) (CMS-HCC)    ??? HIV (human immunodeficiency virus infection) (CMS-HCC)    ??? IUD (intrauterine device) in place    ??? Migraine    ???  Obesity    ??? Pseudotumor cerebri    ??? STD (sexually transmitted disease)        Past Surgical History:   Procedure Laterality Date   ??? LUMBAR PUNCTURE     ??? PR COLONOSCOPY W/BIOPSY SINGLE/MULTIPLE  07/03/2014    Procedure: COLONOSCOPY, FLEXIBLE, PROXIMAL TO SPLENIC FLEXURE; WITH BIOPSY, SINGLE OR MULTIPLE;  Surgeon: Billie Ruddy, MD;  Location: [facility];  Service: Gastroenterology   ??? PR DILATION/CURETTAGE,DIAGNOSTIC N/A 06/14/2018    Procedure: DILATION AND CURETTAGE, DIAGNOSTIC AND/OR THERAPEUTIC (NON OBSTETRICAL);  Surgeon: Nelle Don, MD;  Location: [facility];  Service: [facility] Primary Gynecology   ??? PR UPPER GI ENDOSCOPY,BIOPSY N/A 07/03/2014    Procedure: UGI ENDOSCOPY; WITH BIOPSY, SINGLE OR MULTIPLE;  Surgeon: Billie Ruddy, MD;  Location: [facility];  Service: Gastroenterology       Social History     Socioeconomic History   ??? Marital status: Married     Spouse name: None   ??? Number of children: None   ??? Years of education: None   ??? Highest education level: None   Occupational History   ??? None   Social Needs   ??? Financial resource strain: None   ??? Food insecurity:     Worry: None     Inability: None   ??? Transportation needs:     Medical: None     Non-medical: None   Tobacco Use   ??? Smoking status: Former Smoker     Types: Cigarettes   ??? Smokeless tobacco: Never Used   Substance and Sexual Activity   ??? Alcohol use: Not Currently     Alcohol/week: 0.0 standard drinks     Comment: rare   ??? Drug use: Not Currently     Types: Marijuana   ??? Sexual activity: Not Currently     Partners: Male   Lifestyle   ??? Physical activity:     Days per week: None     Minutes per session: None   ??? Stress: None   Relationships   ??? Social connections:     Talks on phone: None     Gets together: None     Attends religious service: None     Active member of club or organization: None     Attends meetings of clubs or organizations: None     Relationship status: None   Other Topics Concern   ??? None   Social History Narrative    Patient lives in [place], [place] with her fiance and daughter.  She is not employed.       Family History   Problem Relation Age of Onset   ??? Cervical cancer Mother    ??? Diabetes Mother    ??? Asthma Mother    ??? Diabetes Maternal Aunt    ??? Breast cancer Maternal Grandmother    ??? Cancer Maternal Grandmother    ??? No Known Problems Daughter    ??? Glaucoma Neg Hx         Review of Systems     A 10-system review of systems was conducted and was negative except as documented above in the HPI.       Objective        Vital signs: BP 111/59 (BP Site: R Arm, BP Position: Sitting, BP Cuff Size: Medium)  - Pulse 114  - Ht 149.9 cm (4' 11.02)  - Wt 77.4 kg (170 lb 9.6 oz)  - LMP  07/14/2018  - BMI 34.44 kg/m??      Physical Exam:  Neurological Examination:   ??  Mental Status: Alert, conversant, able to follow conversation and interview. Spontaneous speech was fluent without word finding pauses, dysarthria, or paraphasic errors. Comprehension was intact. Memory for recent and remote events was intact.  ??  Cranial Nerves: Fundoscopic exam reveals bilateral papilledema, grade I-II, R > L. PERRLA. Visual fields full to confrontation. Pursuit eye movements were uninterrupted with full range and without more than end-gaze nystagmus. Facial sensation intact bilaterally to light touch in all three divisions of CNV. Face symmetric at rest. Normal facial movement bilaterally, including forehead, eye closure and grimace/smile. Hearing intact to conversation. Shoulder shrug full strength bilaterally. Palate movement is symmetric. Tongue protrudes midline and tongue movements are normal.   ??  Motor Exam: Normal bulk.  No tremors, myoclonus, or other adventitious movement.  Pronator drift is absent.  ??  Reflexes: DTRs are 2+ and symmetric throughout. Toes are downgoing bilaterally.  ??  Sensory: Sensation normal to light touch and temperature sensation to cold in both hands and both feet and to vibration distally in the fingers and toes. Of note, patient is tense and tender to palpation throughout posterior shoulder girdle and over occiput bilaterally in the course of occipital nerves. Vibration is normal in the bilateral upper (tested at [facility]) and lower (tested at [facility]) extremities.    ??  Cerebellar/Coordination/Gait: Rapid alternating movements are normal in bilateral upper extremities. Finger-to-nose is normal without ataxia or dysmetria bilaterally. Heel-to-shin is normal without ataxia or dysmetria bilaterally. Gait exam demonstrates normal posture, base, stride length, arm swing and turns.        Diagnostic Studies     All Labs Last 24hrs:   No results found for this or any previous visit (from the past 24 hour(s)).  Risk Stratification:   Cholesterol (mg/dL)   Date Value   13/05/6577 140     Triglycerides (mg/dL)   Date Value   46/96/2952 99     HDL (mg/dL)   Date Value   84/13/2440 42     LDL Calculated (mg/dL)   Date Value   08/06/2535 78     TSH (uIU/mL)   Date Value   07/04/2018 2.190     Hemoglobin A1C (%)   Date Value   03/28/2018 5.2

## 2018-08-22 NOTE — Unmapped (Signed)
You were seen today in [facility] Neurology Clinic for management of your chronic, daily, multifactorial headaches with components of IIH, migraines, and occipital neuralgia. Your plan is listed below:  - It is essential to get back in touch with Ophthalmology to get your visual fields and retinas evaluated formally; please arrange this as soon as possible  - Continue using promethazine, and Imitrex (no more than twice per week) as you have been doing for relief from migraines; you can also use Tylenol safely in pregnancy.    - Inasmuch as possible, take steps to limit stressors, keep a regular bedtime, and hydrate throughout the day. Limit neck flexion posture (I.e. looking down at your phone) as this exacerbates occipital neuralgia.  - Neurology follow-up in 3-6 months    Regards,  [facility] Neurology

## 2018-08-27 ENCOUNTER — Ambulatory Visit (INDEPENDENT_AMBULATORY_CARE_PROVIDER_SITE_OTHER): Payer: 59 | Admitting: Obstetrics and Gynecology

## 2018-08-27 ENCOUNTER — Encounter: Payer: Self-pay | Admitting: Obstetrics and Gynecology

## 2018-08-27 VITALS — BP 122/81 | HR 102 | Ht 59.0 in | Wt 172.0 lb

## 2018-08-27 DIAGNOSIS — Z3200 Encounter for pregnancy test, result unknown: Secondary | ICD-10-CM | POA: Diagnosis not present

## 2018-08-27 LAB — POCT URINE PREGNANCY: Preg Test, Ur: POSITIVE — AB

## 2018-08-27 NOTE — Progress Notes (Signed)
HPI:      Amber Key is a 32 y.o. [id number] who LMP was Patient's last menstrual period was 07/14/2018 (exact date).  Subjective:   She presents today for pregnancy confirmation.  Her last pregnancy ended in miscarriage and subsequent D&C in September.  She has been attempting pregnancy.  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 Additional significant history includes a history of ectopic pregnancy.  (Medical management ) history of HIV-with CD4 count 45.    Hx: The following portions of the patient's history were reviewed and updated as appropriate:             She  has a past medical history of Enlarged lymph nodes (unk), HIV (human immunodeficiency virus infection) ([place]), Lymphoma ([place]), Memory loss, Migraines, Pseudotumor cerebri ([place]), and Vision loss. She does not have any pertinent problems on file. She  has a past surgical history that includes Colonoscopy; Upper gi endoscopy; wisdom teeth; Lumbar puncture; and No past surgeries. Her family history includes Breast cancer in her maternal grandmother and mother; Diabetes in her maternal aunt; Hypertension in her maternal aunt; Ovarian cancer in her maternal aunt, maternal grandmother, and mother. She  reports that she has been smoking e-cigarettes and cigarettes. She has never used smokeless tobacco. She reports that she drinks alcohol. She reports that she has current or past drug history. Drug: Marijuana. Frequency: 7.00 times per week. She has a current medication list which includes the following prescription(s): acetazolamide, bictegravir-emtricitabine-tenofovir af, diphenhydramine, naproxen, promethazine, and sumatriptan. She is allergic to peanut-containing drug products.       Review of Systems:  Review of Systems  Constitutional: Denied constitutional symptoms, night sweats, recent illness, fatigue, fever, insomnia and weight loss.  Eyes: Denied eye symptoms, eye pain, photophobia, vision change  and visual disturbance.  Ears/Nose/Throat/Neck: Denied ear, nose, throat or neck symptoms, hearing loss, nasal discharge, sinus congestion and sore throat.  Cardiovascular: Denied cardiovascular symptoms, arrhythmia, chest pain/pressure, edema, exercise intolerance, orthopnea and palpitations.  Respiratory: Denied pulmonary symptoms, asthma, pleuritic pain, productive sputum, cough, dyspnea and wheezing.  Gastrointestinal: Denied, gastro-esophageal reflux, melena, nausea and vomiting.  Genitourinary: Denied genitourinary symptoms including symptomatic vaginal discharge, pelvic relaxation issues, and urinary complaints.  Musculoskeletal: Denied musculoskeletal symptoms, stiffness, swelling, muscle weakness and myalgia.  Dermatologic: Denied dermatology symptoms, rash and scar.  Neurologic: Denied neurology symptoms, dizziness, headache, neck pain and syncope.  Psychiatric: Denied psychiatric symptoms, anxiety and depression.  Endocrine: Denied endocrine symptoms including hot flashes and night sweats.   Meds:   Current Outpatient Medications on File Prior to Visit  Medication Sig Dispense Refill  . acetaZOLAMIDE (DIAMOX) 500 MG capsule Take 500 mg by mouth 2 (two) times daily.    . bictegravir-emtricitabine-tenofovir AF (BIKTARVY) 50-200-25 MG TABS tablet Take by mouth daily.    . diphenhydrAMINE (BENADRYL) 25 mg capsule Take 25 mg by mouth every 6 (six) hours as needed.    . naproxen (NAPROSYN) 500 MG tablet Take 500 mg by mouth 2 (two) times daily with a meal.    . promethazine (PHENERGAN) 25 MG tablet Take 25 mg by mouth every 6 (six) hours as needed for nausea or vomiting.    . SUMAtriptan (IMITREX) 50 MG tablet Take 50 mg by mouth every 2 (two) hours as needed for migraine. May repeat in 2 hours if headache persists or recurs.     No current facility-administered medications on file prior to visit.     Objective:     Vitals:  08/27/18 0838  BP: 122/81  Pulse: (!) 102                 Assessment:    [id number] Patient Active Problem List   Diagnosis Date Noted  . Pregnancy of unknown anatomic location 05/21/2018  . Abnormal mammogram 06/26/2014  . Adenopathy 02/11/2014  . Human immunodeficiency virus (HIV) infection ([place]) 02/10/2014  . Headache, migraine 12/13/2013  . Benign intracranial hypertension 12/13/2013     1. Possible pregnancy     First trimester pregnancy approximately 4 to 5 weeks.  Patient not having any issues at this time.  She has a history of recurrent miscarriage and has been followed at [facility] for this.  She has specifically requested that she deliver at [facility] if she carries this pregnancy to term.  She refuses admission to [facility] for delivery.  Once her pregnancy is confirmed by ultrasound to be viable she would like to transfer her care to [facility] perinatology in [place] until delivery at [facility].   Plan:            1.  Begin prenatal vitamins  2.  Ultrasound for dating and viability in 2 weeks.  Once baby noted to be viable transfer to [facility] perinatology.  (Patient request) Orders Orders Placed This Encounter  Procedures  . POCT urine pregnancy    No orders of the defined types were placed in this encounter.     F/U  Return in about 4 weeks (around 09/24/2018). I spent 19 minutes involved in the care of this patient of which greater than 50% was spent discussing recurrent pregnancy loss, history of pregnancy losses, chromosomal translocations, HIV and pregnancy, ongoing management of this high risk pregnancy.  Finis Bud, M.D. 08/27/2018 10:09 AM

## 2018-08-27 NOTE — Progress Notes (Signed)
Pt here for pregnancy conformation. Pt states she has had 8 miscarriages. LMP 07/14/18, UPT positive.

## 2018-08-29 ENCOUNTER — Telehealth: Payer: Self-pay

## 2018-08-29 ENCOUNTER — Emergency Department
Admission: EM | Admit: 2018-08-29 | Discharge: 2018-08-29 | Disposition: A | Discharge status: Home / Self Care | Payer: 59 | Attending: Emergency Medicine | Admitting: Emergency Medicine

## 2018-08-29 ENCOUNTER — Emergency Department: Payer: 59

## 2018-08-29 ENCOUNTER — Other Ambulatory Visit: Payer: Self-pay

## 2018-08-29 DIAGNOSIS — O26891 Other specified pregnancy related conditions, first trimester: Secondary | ICD-10-CM

## 2018-08-29 DIAGNOSIS — O2621 Pregnancy care for patient with recurrent pregnancy loss, first trimester: Secondary | ICD-10-CM | POA: Insufficient documentation

## 2018-08-29 DIAGNOSIS — F17290 Nicotine dependence, other tobacco product, uncomplicated: Secondary | ICD-10-CM | POA: Diagnosis not present

## 2018-08-29 DIAGNOSIS — Z8572 Personal history of non-Hodgkin lymphomas: Secondary | ICD-10-CM | POA: Diagnosis not present

## 2018-08-29 DIAGNOSIS — B20 Human immunodeficiency virus [HIV] disease: Secondary | ICD-10-CM | POA: Diagnosis not present

## 2018-08-29 DIAGNOSIS — Z3A01 Less than 8 weeks gestation of pregnancy: Secondary | ICD-10-CM | POA: Insufficient documentation

## 2018-08-29 DIAGNOSIS — Z79899 Other long term (current) drug therapy: Secondary | ICD-10-CM | POA: Insufficient documentation

## 2018-08-29 DIAGNOSIS — F1210 Cannabis abuse, uncomplicated: Secondary | ICD-10-CM | POA: Insufficient documentation

## 2018-08-29 DIAGNOSIS — F17210 Nicotine dependence, cigarettes, uncomplicated: Secondary | ICD-10-CM | POA: Insufficient documentation

## 2018-08-29 DIAGNOSIS — Z91010 Allergy to peanuts: Secondary | ICD-10-CM | POA: Insufficient documentation

## 2018-08-29 DIAGNOSIS — R1030 Lower abdominal pain, unspecified: Secondary | ICD-10-CM | POA: Diagnosis not present

## 2018-08-29 DIAGNOSIS — R109 Unspecified abdominal pain: Secondary | ICD-10-CM

## 2018-08-29 DIAGNOSIS — R1032 Left lower quadrant pain: Secondary | ICD-10-CM | POA: Insufficient documentation

## 2018-08-29 LAB — COMPREHENSIVE METABOLIC PANEL
ALT: 17 U/L (ref 0–44)
AST: 20 U/L (ref 15–41)
Albumin: 3.8 g/dL (ref 3.5–5.0)
Alkaline Phosphatase: 52 U/L (ref 38–126)
Anion gap: 8 (ref 5–15)
BILIRUBIN TOTAL: 0.4 mg/dL (ref 0.3–1.2)
BUN: 7 mg/dL (ref 6–20)
CALCIUM: 9.2 mg/dL (ref 8.9–10.3)
CO2: 25 mmol/L (ref 22–32)
CREATININE: 0.64 mg/dL (ref 0.44–1.00)
Chloride: 105 mmol/L (ref 98–111)
Glucose, Bld: 75 mg/dL (ref 70–99)
Potassium: 3.6 mmol/L (ref 3.5–5.1)
Sodium: 138 mmol/L (ref 135–145)
TOTAL PROTEIN: 7.2 g/dL (ref 6.5–8.1)

## 2018-08-29 LAB — URINALYSIS, COMPLETE (UACMP) WITH MICROSCOPIC
Bacteria, UA: NONE SEEN
Bilirubin Urine: NEGATIVE
Glucose, UA: NEGATIVE mg/dL
Hgb urine dipstick: NEGATIVE
Ketones, ur: NEGATIVE mg/dL
Leukocytes, UA: NEGATIVE
Nitrite: NEGATIVE
Protein, ur: NEGATIVE mg/dL
Specific Gravity, Urine: 1.014 (ref 1.005–1.030)
pH: 7 (ref 5.0–8.0)

## 2018-08-29 LAB — CBC
HCT: 34.9 % — ABNORMAL LOW (ref 36.0–46.0)
Hemoglobin: 11.5 g/dL — ABNORMAL LOW (ref 12.0–15.0)
MCH: 29.3 pg (ref 26.0–34.0)
MCHC: 33 g/dL (ref 30.0–36.0)
MCV: 88.8 fL (ref 80.0–100.0)
PLATELETS: 216 10*3/uL (ref 150–400)
RBC: 3.93 MIL/uL (ref 3.87–5.11)
RDW: 14.9 % (ref 11.5–15.5)
WBC: 6.8 10*3/uL (ref 4.0–10.5)
nRBC: 0 % (ref 0.0–0.2)

## 2018-08-29 LAB — LIPASE, BLOOD: Lipase: 27 U/L (ref 11–51)

## 2018-08-29 LAB — HCG, QUANTITATIVE, PREGNANCY: hCG, Beta Chain, Quant, S: 39298 m[IU]/mL — ABNORMAL HIGH (ref <5)

## 2018-08-29 MED ORDER — ACETAMINOPHEN 500 MG PO TABS
1000.0000 mg | ORAL_TABLET | Freq: Once | ORAL | Status: AC
  Administered 2018-08-29: 1000 mg via ORAL
  Filled 2018-08-29: qty 2

## 2018-08-29 NOTE — ED Triage Notes (Signed)
Pt states is pregnant but doesn't know how far long. October 5th was last period. States having abd pain in middle. A&O, ambulatory. Denies bleeding or discharge. 8 previous miscarriages.

## 2018-08-29 NOTE — ED Provider Notes (Signed)
[facility] Emergency Department Provider Note  ____________________________________________  Time seen: Approximately 2:59 PM  I have reviewed the triage vital signs and the nursing notes.   HISTORY  Chief Complaint Abdominal Pain   HPI Amber Key is a 32 y.o. female with h/o HIV and several prior miscarriages who presents for evaluation of abdominal pain. Patient reports taking a pregnancy test a few weeks ago which was positive.  She has not establish care for this pregnancy yet.  Her last menstrual period was 07/14/2018.  This is patient's ninth pregnancy.  She has a 75 year old child and 7 miscarriages all in the first trimester.  She reports that last night she started having lower sharp abdominal pain.  The pain is moderate, mostly in the suprapubic and left lower quadrant regions, intermittent and nonradiating.  No vaginal discharge or vaginal bleeding.  No fever or chills, no nausea, vomiting, constipation, diarrhea.  Patient reports having a pelvic exam done 2 weeks ago which was negative for STDs.  No prior abdominal surgeries.  Past Medical History:  Diagnosis Date  . Enlarged lymph nodes unk  . HIV (human immunodeficiency virus infection) ([place])    diagnosed years ago at [facility]  . Lymphoma ([place])   . Memory loss   . Migraines   . Pseudotumor cerebri unk  . Vision loss     Patient Active Problem List   Diagnosis Date Noted  . Pregnancy of unknown anatomic location 05/21/2018  . Abnormal mammogram 06/26/2014  . Adenopathy 02/11/2014  . Human immunodeficiency virus (HIV) infection ([place]) 02/10/2014  . Headache, migraine 12/13/2013  . Benign intracranial hypertension 12/13/2013    Past Surgical History:  Procedure Laterality Date  . COLONOSCOPY    . LUMBAR PUNCTURE    . NO PAST SURGERIES    . UPPER GI ENDOSCOPY    . wisdom teeth      Prior to Admission medications   Medication Sig Start Date End Date Taking? Authorizing Provider    acetaZOLAMIDE (DIAMOX) 500 MG capsule Take 500 mg by mouth 2 (two) times daily.    [provider]  bictegravir-emtricitabine-tenofovir AF (BIKTARVY) 50-200-25 MG TABS tablet Take by mouth daily.    [provider]  diphenhydrAMINE (BENADRYL) 25 mg capsule Take 25 mg by mouth every 6 (six) hours as needed.    [provider]  naproxen (NAPROSYN) 500 MG tablet Take 500 mg by mouth 2 (two) times daily with a meal.    [provider]  promethazine (PHENERGAN) 25 MG tablet Take 25 mg by mouth every 6 (six) hours as needed for nausea or vomiting.    [provider]  SUMAtriptan (IMITREX) 50 MG tablet Take 50 mg by mouth every 2 (two) hours as needed for migraine. May repeat in 2 hours if headache persists or recurs.    [provider]    Allergies Peanut-containing drug products  Family History  Problem Relation Age of Onset  . Ovarian cancer Mother   . Breast cancer Mother   . Ovarian cancer Maternal Aunt   . Diabetes Maternal Aunt   . Hypertension Maternal Aunt   . Ovarian cancer Maternal Grandmother   . Breast cancer Maternal Grandmother   . Colon cancer Neg Hx     Social History Social History   Tobacco Use  . Smoking status: Current Every Day Smoker    Types: E-cigarettes, Cigarettes  . Smokeless tobacco: Never Used  Substance Use Topics  . Alcohol use: Yes  Comment: occas  . Drug use: Yes    Frequency: 7.0 times per week    Types: Marijuana    Comment: for migraines    Review of Systems  Constitutional: Negative for fever. Eyes: Negative for visual changes. ENT: Negative for sore throat. Neck: No neck pain  Cardiovascular: Negative for chest pain. Respiratory: Negative for shortness of breath. Gastrointestinal: + abdominal pain. No vomiting or diarrhea. Genitourinary: Negative for dysuria. Musculoskeletal: Negative for back pain. Skin: Negative for rash. Neurological: Negative for headaches, weakness or  numbness. Psych: No SI or HI  ____________________________________________   PHYSICAL EXAM:  VITAL SIGNS: ED Triage Vitals  Enc Vitals Group     BP 08/29/18 1234 (!) 118/55     Pulse Rate 08/29/18 1234 (!) 101     Resp 08/29/18 1234 16     Temp 08/29/18 1234 98.7 F (37.1 C)     Temp src --      SpO2 08/29/18 1234 98 %     Weight 08/29/18 1235 172 lb (78 kg)     Height 08/29/18 1235 4\' 11"  (1.499 m)     Head Circumference --      Peak Flow --      Pain Score 08/29/18 1234 8     Pain Loc --      Pain Edu? --      Excl. in [place]? --     Constitutional: Alert and oriented. Well appearing and in no apparent distress. HEENT:      Head: Normocephalic and atraumatic.         Eyes: Conjunctivae are normal. Sclera is non-icteric.       Mouth/Throat: Mucous membranes are moist.       Neck: Supple with no signs of meningismus. Cardiovascular: Regular rate and rhythm. No murmurs, gallops, or rubs. 2+ symmetrical distal pulses are present in all extremities. No JVD. Respiratory: Normal respiratory effort. Lungs are clear to auscultation bilaterally. No wheezes, crackles, or rhonchi.  Gastrointestinal: Soft, mild tenderness to palpation over the suprapubic and LLQ regions, and non distended with positive bowel sounds. No rebound or guarding. Genitourinary: No CVA tenderness. Musculoskeletal: Nontender with normal range of motion in all extremities. No edema, cyanosis, or erythema of extremities. Neurologic: Normal speech and language. Face is symmetric. Moving all extremities. No gross focal neurologic deficits are appreciated. Skin: Skin is warm, dry and intact. No rash noted. Psychiatric: Mood and affect are normal. Speech and behavior are normal.  ____________________________________________   LABS (all labs ordered are listed, but only abnormal results are displayed)  Labs Reviewed  CBC - Abnormal; Notable for the following components:      Result Value   Hemoglobin 11.5 (*)     HCT 34.9 (*)    All other components within normal limits  URINALYSIS, COMPLETE (UACMP) WITH MICROSCOPIC - Abnormal; Notable for the following components:   Color, Urine YELLOW (*)    APPearance CLEAR (*)    All other components within normal limits  HCG, QUANTITATIVE, PREGNANCY - Abnormal; Notable for the following components:   hCG, Beta Chain, Quant, S 39,298 (*)    All other components within normal limits  LIPASE, BLOOD  COMPREHENSIVE METABOLIC PANEL   ____________________________________________  EKG  none  ____________________________________________  RADIOLOGY  I have personally reviewed the images performed during this visit and I agree with the Radiologist's read.   Interpretation by Radiologist:  US Ob Comp Less 14 Wks  Result Date: 08/29/2018 CLINICAL DATA:  Abdominal pain in  pregnancy. EXAM: OBSTETRIC <14 WK [place] AND TRANSVAGINAL OB US DOPPLER ULTRASOUND OF OVARIES TECHNIQUE: Both transabdominal and transvaginal ultrasound examinations were performed for complete evaluation of the gestation as well as the maternal uterus, adnexal regions, and pelvic cul-de-sac. Transvaginal technique was performed to assess early pregnancy. Color and duplex Doppler ultrasound was utilized to evaluate blood flow to the ovaries. COMPARISON:  05/21/2018 FINDINGS: Intrauterine gestational sac: Single seen in lower uterine segment Yolk sac:  Yes Embryo:  Yes Cardiac Activity: Yes Heart Rate: 104 bpm MSD:   mm    w     d CRL:   2.7 mm   5 w 6 d                  [place] EDC: 04/25/2019 Subchorionic hemorrhage:  None visualized. Maternal uterus/adnexae: Subchorionic hemorrhage: None Right ovary: Contains a 2 x 2.2 x 1.6 cm corpus luteum. Left ovary: Normal Other :Small anterior fundal fibroid measures 1.4 cm. Posterior fundal fibroid measures 4.2 x 3.0 x 4.0 cm. Free fluid:  None Pulsed Doppler evaluation of both ovaries demonstrates normal appearing low-resistance arterial and venous waveforms. IMPRESSION:  1. Single living intrauterine gestation is identified with an estimated gestational age of [redacted] weeks and 6 days. The gestational sac is identified in the lower uterine segment. 2. Two fundal fibroids noted.  The largest measures 4.2 cm. 3. No evidence for ovarian torsion. Electronically Signed   By: Kerby Moors M.D.   On: 08/29/2018 16:14   US Ob Transvaginal  Result Date: 08/29/2018 CLINICAL DATA:  Abdominal pain in pregnancy. EXAM: OBSTETRIC <14 WK [place] AND TRANSVAGINAL OB US DOPPLER ULTRASOUND OF OVARIES TECHNIQUE: Both transabdominal and transvaginal ultrasound examinations were performed for complete evaluation of the gestation as well as the maternal uterus, adnexal regions, and pelvic cul-de-sac. Transvaginal technique was performed to assess early pregnancy. Color and duplex Doppler ultrasound was utilized to evaluate blood flow to the ovaries. COMPARISON:  05/21/2018 FINDINGS: Intrauterine gestational sac: Single seen in lower uterine segment Yolk sac:  Yes Embryo:  Yes Cardiac Activity: Yes Heart Rate: 104 bpm MSD:   mm    w     d CRL:   2.7 mm   5 w 6 d                  [place] EDC: 04/25/2019 Subchorionic hemorrhage:  None visualized. Maternal uterus/adnexae: Subchorionic hemorrhage: None Right ovary: Contains a 2 x 2.2 x 1.6 cm corpus luteum. Left ovary: Normal Other :Small anterior fundal fibroid measures 1.4 cm. Posterior fundal fibroid measures 4.2 x 3.0 x 4.0 cm. Free fluid:  None Pulsed Doppler evaluation of both ovaries demonstrates normal appearing low-resistance arterial and venous waveforms. IMPRESSION: 1. Single living intrauterine gestation is identified with an estimated gestational age of [redacted] weeks and 6 days. The gestational sac is identified in the lower uterine segment. 2. Two fundal fibroids noted.  The largest measures 4.2 cm. 3. No evidence for ovarian torsion. Electronically Signed   By: Kerby Moors M.D.   On: 08/29/2018 16:14   US Pelvic Doppler (torsion R/o Or Mass Arterial  Flow)  Result Date: 08/29/2018 CLINICAL DATA:  Abdominal pain in pregnancy. EXAM: OBSTETRIC <14 WK [place] AND TRANSVAGINAL OB US DOPPLER ULTRASOUND OF OVARIES TECHNIQUE: Both transabdominal and transvaginal ultrasound examinations were performed for complete evaluation of the gestation as well as the maternal uterus, adnexal regions, and pelvic cul-de-sac. Transvaginal technique was performed to assess early pregnancy. Color and duplex Doppler ultrasound was  utilized to evaluate blood flow to the ovaries. COMPARISON:  05/21/2018 FINDINGS: Intrauterine gestational sac: Single seen in lower uterine segment Yolk sac:  Yes Embryo:  Yes Cardiac Activity: Yes Heart Rate: 104 bpm MSD:   mm    w     d CRL:   2.7 mm   5 w 6 d                  [place] EDC: 04/25/2019 Subchorionic hemorrhage:  None visualized. Maternal uterus/adnexae: Subchorionic hemorrhage: None Right ovary: Contains a 2 x 2.2 x 1.6 cm corpus luteum. Left ovary: Normal Other :Small anterior fundal fibroid measures 1.4 cm. Posterior fundal fibroid measures 4.2 x 3.0 x 4.0 cm. Free fluid:  None Pulsed Doppler evaluation of both ovaries demonstrates normal appearing low-resistance arterial and venous waveforms. IMPRESSION: 1. Single living intrauterine gestation is identified with an estimated gestational age of [redacted] weeks and 6 days. The gestational sac is identified in the lower uterine segment. 2. Two fundal fibroids noted.  The largest measures 4.2 cm. 3. No evidence for ovarian torsion. Electronically Signed   By: Kerby Moors M.D.   On: 08/29/2018 16:14      ____________________________________________   PROCEDURES  Procedure(s) performed: None Procedures Critical Care performed:  None ____________________________________________   INITIAL IMPRESSION / ASSESSMENT AND PLAN / ED COURSE  32 y.o. female with h/o HIV and several prior miscarriages who presents for evaluation of abdominal pain, currently pregnant, LMP 07/14/18, no prior US for this  pregnancy.  Patient is well-appearing and in no distress, abdomen is soft with mild suprapubic and left lower quadrant tenderness.  Differential diagnosis including pregnancy versus ectopic versus TOA versus PID versus STD versus ovarian pathology such as cyst or torsion.  Recommended pelvic exam however patient reports having one done 2 weeks ago which was negative for STDs and refused any further testing at this time.  But will send patient for transvaginal ultrasound to rule out ectopic or ovarian pathology.  Labs are within normal limits with normal white cells, CBC, lipase, and urinalysis.  Beta quant is positive at 39K. Will give tylenol for the pain.   Clinical Course as of Aug 29 1636  Wed Aug 29, 2018  1635 Vaginal ultrasound confirms an IUP at 5 weeks 6 days gestational age with fetal heart rate of 104.  After I told this to the patient and showed her the pictures of the ultrasound patient was jumping up and down excitement crying and very happy with the news.  She remains with a completely benign abdominal exam.  Ultrasound also showed normal bilateral adnexa.  At this time she will be discharged home with follow-up with her OB/GYN for prenatal care.   [CV]    Clinical Course User Index [CV] Alfred Levins [place], MD     As part of my medical decision making, I reviewed the following data within the [place] notes reviewed and incorporated, Labs reviewed , Old chart reviewed, Radiograph reviewed , Notes from prior ED visits and [place] Controlled Substance Database    Pertinent labs & imaging results that were available during my care of the patient were reviewed by me and considered in my medical decision making (see chart for details).    ____________________________________________   FINAL CLINICAL IMPRESSION(S) / ED DIAGNOSES  Final diagnoses:  Abdominal pain  Abdominal pain during pregnancy in first trimester      NEW MEDICATIONS STARTED DURING THIS  VISIT:  ED Discharge Orders  None       Note:  This document was prepared using Dragon voice recognition software and may include unintentional dictation errors.    Rudene Re, MD 08/29/18 [phone]

## 2018-08-29 NOTE — ED Triage Notes (Signed)
First Nurse Note:  C/O abdominal cramping today.  States she is 4-[redacted] weeks pregnant with LMP in October 2019.  Patient seen for pregnancy by Dr. Amalia Hailey on 11/18.

## 2018-08-29 NOTE — ED Notes (Signed)
Patient transported to Ultrasound 

## 2018-08-29 NOTE — Telephone Encounter (Signed)
Pt called from the ER regarding her abdominal pain. Pt stated she had been at the ED for over 2 hours and had not received any care. I informed the pt that we had no control over the wait at the hospital but we would be happy to have her for her follow up care. Pt stated she may go to another hospital, but would update [place] on any changes or events. Pt was pleased with the office's care but very unhappy with the hospital. Pt is to call back if she leaves ED.

## 2018-08-29 NOTE — Telephone Encounter (Signed)
Pt's husband informed that the pt was experiencing stabbing pains that she is unable to tolerate any longer. Due to the pt's history of miscarriages, I advised the husband that she should go to the ER. Husband stated he was on his way to pick the pt up and take her to the ER.

## 2018-08-31 NOTE — Unmapped (Signed)
[facility] Refill Coordination Note    Specialty Medication(s) to be Shipped:   Infectious Disease: Biktarvy    Other medication(s) to be shipped: none     [name], DOB: 26-Mar-1986  Phone: [phone] (home)       All above HIPAA information was verified with patient.     Completed refill call assessment today to schedule patient's medication shipment from the [facility] [phone]).       Specialty medication(s) and dose(s) confirmed: Regimen is correct and unchanged.   Changes to medications: [place] reports no changes reported at this time.  Changes to insurance: No  Questions for the pharmacist: No    The patient will receive a drug information handout for each medication shipped and additional FDA Medication Guides as required.      DISEASE/MEDICATION-SPECIFIC INFORMATION            ADHERENCE        Pt states no missed doses. Unsure of how much she has [place] but knows she is close to being out although has enough to get through until delivery/      MEDICARE PART B DOCUMENTATION         SHIPPING     Shipping address confirmed in Epic.     Delivery Scheduled: Yes, Expected medication delivery date: 09/04/18 via UPS or courier.     Medication will be delivered via Next Day Courier to the home address in Epic WAM.    Arnold Long   [facility] Specialty Pharmacist

## 2018-09-01 NOTE — Unmapped (Signed)
Addended by: Lucretia Field D on: 09/01/2018 03:26 PM     Modules accepted: Orders

## 2018-09-03 MED FILL — BIKTARVY 50 MG-200 MG-25 MG TABLET: ORAL | 30 days supply | Qty: 30 | Fill #1

## 2018-09-03 MED FILL — BIKTARVY 50 MG-200 MG-25 MG TABLET: 30 days supply | Qty: 30 | Fill #1 | Status: AC

## 2018-09-11 ENCOUNTER — Ambulatory Visit (INDEPENDENT_AMBULATORY_CARE_PROVIDER_SITE_OTHER): Payer: 59

## 2018-09-11 DIAGNOSIS — N926 Irregular menstruation, unspecified: Secondary | ICD-10-CM

## 2018-09-11 DIAGNOSIS — Z3A01 Less than 8 weeks gestation of pregnancy: Secondary | ICD-10-CM

## 2018-09-11 DIAGNOSIS — O3680X0 Pregnancy with inconclusive fetal viability, not applicable or unspecified: Secondary | ICD-10-CM

## 2018-09-11 DIAGNOSIS — Z3201 Encounter for pregnancy test, result positive: Secondary | ICD-10-CM

## 2018-09-12 NOTE — Unmapped (Signed)
Recent neurology note states   She also recently (this past weekend) discovered that she was newly pregnant, and has ceased all medications except promethazine and sumatriptan; she has avoided Tylenol despite repeated assurances that it is safe in pregnancy.    I called patient to discuss this and to make sure she was continuing to take Biktarvy while pregnant (during her recent pregnancy she declined ART switch). No answer, left discreet message. Per medical record she has an OB appt scheduled for 09/18/18.    I will also send a MyChart message to patient with same information.    [name], MD, MPH   [facility]   [address], 1st floor   [place], [place]. [address]   Phone: [phone]   Fax: [phone]

## 2018-09-17 NOTE — Unmapped (Signed)
Patient called and asked to have visual fields testing done locally.  She has found a location, [facility] in [place] that can do this testing.      She has not heard anything from [facility] and said it would be much easier on her if she can do it locally.    [facility]  Phone: [phone]  Fax: [phone]    If you need to speak to the patient, she can be reached at [phone]

## 2018-09-18 NOTE — Unmapped (Signed)
Faxed refill request received from pharmacy: [facility]    Last Visit Date: 08/22/2018     Next Visit Date: 01/23/2019

## 2018-09-18 NOTE — Unmapped (Signed)
New to nurse completed w/ pt. History and intake obtained and abstracted to Epic. Appt line, RN advice line and OB after hrs numbers given. Questions and answered and pt verbalized understanding. Call transfered to scheduling dept to facilitate scheduling pt's initial OB appt and u/s. Warm handoff completed. Pt to be scheduled in MFM clinic.

## 2018-09-19 ENCOUNTER — Emergency Department
Admission: EM | Admit: 2018-09-19 | Discharge: 2018-09-19 | Disposition: A | Discharge status: Home / Self Care | Payer: 59 | Attending: Emergency Medicine | Admitting: Emergency Medicine

## 2018-09-19 ENCOUNTER — Other Ambulatory Visit: Payer: Self-pay

## 2018-09-19 ENCOUNTER — Telehealth: Payer: Self-pay | Admitting: Surgical

## 2018-09-19 ENCOUNTER — Encounter: Payer: Self-pay | Admitting: Emergency Medicine

## 2018-09-19 ENCOUNTER — Encounter: Payer: Self-pay | Admitting: Surgical

## 2018-09-19 DIAGNOSIS — Z79899 Other long term (current) drug therapy: Secondary | ICD-10-CM | POA: Diagnosis not present

## 2018-09-19 DIAGNOSIS — F17210 Nicotine dependence, cigarettes, uncomplicated: Secondary | ICD-10-CM | POA: Insufficient documentation

## 2018-09-19 DIAGNOSIS — R109 Unspecified abdominal pain: Secondary | ICD-10-CM | POA: Diagnosis present

## 2018-09-19 DIAGNOSIS — O200 Threatened abortion: Secondary | ICD-10-CM | POA: Diagnosis not present

## 2018-09-19 LAB — CBC
HCT: 39.7 % (ref 36.0–46.0)
HEMOGLOBIN: 12.9 g/dL (ref 12.0–15.0)
MCH: 29 pg (ref 26.0–34.0)
MCHC: 32.5 g/dL (ref 30.0–36.0)
MCV: 89.2 fL (ref 80.0–100.0)
PLATELETS: 270 10*3/uL (ref 150–400)
RBC: 4.45 MIL/uL (ref 3.87–5.11)
RDW: 14.1 % (ref 11.5–15.5)
WBC: 8.7 10*3/uL (ref 4.0–10.5)
nRBC: 0 % (ref 0.0–0.2)

## 2018-09-19 LAB — URINALYSIS, COMPLETE (UACMP) WITH MICROSCOPIC
BILIRUBIN URINE: NEGATIVE
GLUCOSE, UA: NEGATIVE mg/dL
Ketones, ur: NEGATIVE mg/dL
Leukocytes, UA: NEGATIVE
NITRITE: NEGATIVE
PROTEIN: NEGATIVE mg/dL
Specific Gravity, Urine: 1.018 (ref 1.005–1.030)
pH: 6 (ref 5.0–8.0)

## 2018-09-19 LAB — BASIC METABOLIC PANEL
Anion gap: 7 (ref 5–15)
BUN: 10 mg/dL (ref 6–20)
CALCIUM: 9.1 mg/dL (ref 8.9–10.3)
CO2: 23 mmol/L (ref 22–32)
Chloride: 107 mmol/L (ref 98–111)
Creatinine, Ser: 0.64 mg/dL (ref 0.44–1.00)
GLUCOSE: 94 mg/dL (ref 70–99)
POTASSIUM: 4 mmol/L (ref 3.5–5.1)
SODIUM: 137 mmol/L (ref 135–145)

## 2018-09-19 MED ORDER — PROMETHAZINE 25 MG TABLET
ORAL_TABLET | Freq: Three times a day (TID) | ORAL | 0 refills | 0 days | Status: CP | PRN
Start: 2018-09-19 — End: 2018-10-17

## 2018-09-19 NOTE — ED Notes (Signed)
Dr. Alfred Levins at bedside doing ultrasound.

## 2018-09-19 NOTE — Telephone Encounter (Signed)
Per Dr. Marcelline Mates patient should take 650 mg tylenol and try exercises. I have sent exercises over my chart for patient to try. If patient has any problems she should call us.

## 2018-09-19 NOTE — ED Triage Notes (Signed)
Pt states lower abd pain that began around 0230 this am, denies bleeding, states she is pregnant, appears in NAD.

## 2018-09-19 NOTE — ED Provider Notes (Signed)
[facility] Emergency Department Provider Note  ____________________________________________  Time seen: Approximately 11:13 AM  I have reviewed the triage vital signs and the nursing notes.   HISTORY  Chief Complaint Abdominal Pain   HPI Amber Key is a 32 y.o. female with a history of HIV and several prior miscarriages currently [redacted] weeks gestational age who presents for evaluation of abdominal pain and vaginal bleeding.  Patient reports that she woke up at 2:30 AM with moderate intermittent lower cramping abdominal pain.  As soon as she arrived to the emergency room she started bleeding.  She denies fever, chills, nausea, vomiting, constipation, diarrhea, dysuria, vaginal bleeding, or hematuria.  Patient is very upset since she has had at least 8 miscarriages in the past.  Past Medical History:  Diagnosis Date  . Enlarged lymph nodes unk  . HIV (human immunodeficiency virus infection) ([place])    diagnosed years ago at [facility]  . Lymphoma ([place])   . Memory loss   . Migraines   . Pseudotumor cerebri unk  . Vision loss     Patient Active Problem List   Diagnosis Date Noted  . Pregnancy of unknown anatomic location 05/21/2018  . Abnormal mammogram 06/26/2014  . Adenopathy 02/11/2014  . Human immunodeficiency virus (HIV) infection ([place]) 02/10/2014  . Headache, migraine 12/13/2013  . Benign intracranial hypertension 12/13/2013    Past Surgical History:  Procedure Laterality Date  . COLONOSCOPY    . LUMBAR PUNCTURE    . NO PAST SURGERIES    . UPPER GI ENDOSCOPY    . wisdom teeth      Prior to Admission medications   Medication Sig Start Date End Date Taking? Authorizing Provider  acetaZOLAMIDE (DIAMOX) 500 MG capsule Take 500 mg by mouth 2 (two) times daily.    [provider]  bictegravir-emtricitabine-tenofovir AF (BIKTARVY) 50-200-25 MG TABS tablet Take by mouth daily.    [provider]  diphenhydrAMINE (BENADRYL) 25 mg capsule  Take 25 mg by mouth every 6 (six) hours as needed.    [provider]  naproxen (NAPROSYN) 500 MG tablet Take 500 mg by mouth 2 (two) times daily with a meal.    [provider]  promethazine (PHENERGAN) 25 MG tablet Take 25 mg by mouth every 6 (six) hours as needed for nausea or vomiting.    [provider]  SUMAtriptan (IMITREX) 50 MG tablet Take 50 mg by mouth every 2 (two) hours as needed for migraine. May repeat in 2 hours if headache persists or recurs.    [provider]    Allergies Peanut-containing drug products  Family History  Problem Relation Age of Onset  . Ovarian cancer Mother   . Breast cancer Mother   . Ovarian cancer Maternal Aunt   . Diabetes Maternal Aunt   . Hypertension Maternal Aunt   . Ovarian cancer Maternal Grandmother   . Breast cancer Maternal Grandmother   . Colon cancer Neg Hx     Social History Social History   Tobacco Use  . Smoking status: Current Every Day Smoker    Types: E-cigarettes, Cigarettes  . Smokeless tobacco: Never Used  Substance Use Topics  . Alcohol use: Yes    Comment: occas  . Drug use: Yes    Frequency: 7.0 times per week    Types: Marijuana    Comment: for migraines    Review of Systems  Constitutional: Negative for fever. Eyes: Negative for visual changes. ENT: Negative for sore throat. Neck:  No neck pain  Cardiovascular: Negative for chest pain. Respiratory: Negative for shortness of breath. Gastrointestinal: + lower abdominal pain. No vomiting or diarrhea. Genitourinary: Negative for dysuria. + vaginal bleeding Musculoskeletal: Negative for back pain. Skin: Negative for rash. Neurological: Negative for headaches, weakness or numbness. Psych: No SI or HI  ____________________________________________   PHYSICAL EXAM:  VITAL SIGNS: ED Triage Vitals [09/19/18 1020]  Enc Vitals Group     BP 108/69     Pulse Rate 99     Resp 18     Temp      Temp Source Oral     SpO2  100 %     Weight 170 lb (77.1 kg)     Height 4\' 11"  (1.499 m)     Head Circumference      Peak Flow      Pain Score 8     Pain Loc      Pain Edu?      Excl. in [place]?     Constitutional: Alert and oriented. Well appearing and in no apparent distress. HEENT:      Head: Normocephalic and atraumatic.         Eyes: Conjunctivae are normal. Sclera is non-icteric.       Mouth/Throat: Mucous membranes are moist.       Neck: Supple with no signs of meningismus. Cardiovascular: Regular rate and rhythm. No murmurs, gallops, or rubs. 2+ symmetrical distal pulses are present in all extremities. No JVD. Respiratory: Normal respiratory effort. Lungs are clear to auscultation bilaterally. No wheezes, crackles, or rhonchi.  Gastrointestinal: Soft, non tender, and non distended with positive bowel sounds. No rebound or guarding. Genitourinary: No CVA tenderness. Musculoskeletal: Nontender with normal range of motion in all extremities. No edema, cyanosis, or erythema of extremities. Neurologic: Normal speech and language. Face is symmetric. Moving all extremities. No gross focal neurologic deficits are appreciated. Skin: Skin is warm, dry and intact. No rash noted. Psychiatric: Mood and affect are normal. Speech and behavior are normal.  ____________________________________________   LABS (all labs ordered are listed, but only abnormal results are displayed)  Labs Reviewed  CBC  BASIC METABOLIC PANEL  URINALYSIS, COMPLETE (UACMP) WITH MICROSCOPIC  POC URINE PREG, ED   ____________________________________________  EKG  none  ____________________________________________  RADIOLOGY  none  ____________________________________________   PROCEDURES  Procedure(s) performed: None Procedures Critical Care performed:  None ____________________________________________   INITIAL IMPRESSION / ASSESSMENT AND PLAN / ED COURSE   32 y.o. female with a history of HIV and several prior  miscarriages currently [redacted] weeks gestational age who presents for evaluation of abdominal pain and vaginal bleeding.  Abdomen is soft with no tenderness throughout, vitals are stable, bedside ultrasound showing IUP with fetal heart rate of 169 and good fetal movement. Labs show normal hgb at 12.9, no leukocytosis. Ddx including threatened miscarriage vs first trimester bleed vs subchorionic hemorrhage.  Discussed pelvic rest with patient.  She has an appointment with her OB/GYN in 4 days.  Recommended keeping that appointment for repeat ultrasound.  Discussed signs and symptoms of acute blood loss anemia recommend to return to the emergency room if these develop.      As part of my medical decision making, I reviewed the following data within the [facility] notes reviewed and incorporated, Labs reviewed , Old chart reviewed, Notes from prior ED visits and [place] Controlled Substance Database    Pertinent labs & imaging results that were available during my care of  the patient were reviewed by me and considered in my medical decision making (see chart for details).    ____________________________________________   FINAL CLINICAL IMPRESSION(S) / ED DIAGNOSES  Final diagnoses:  Threatened miscarriage in early pregnancy      NEW MEDICATIONS STARTED DURING THIS VISIT:  ED Discharge Orders    None       Note:  This document was prepared using Dragon voice recognition software and may include unintentional dictation errors.    Rudene Re, MD 09/19/18 [phone]

## 2018-09-19 NOTE — Telephone Encounter (Signed)
Call transferred from front desk-  Pt states she called the office this morning with right sided pelvic and leg pain. She was advised to go to the ED.   Pt currently in the ed. She went to the bathroom and saw blood. She had b/w done. They are waiting for a room to do the u/s.  Pt is crying and very upset. She does have a friend with her. Advised pt that she should stay at the Ed. Let them finish her workup. Once completed she needs to f/u with DJE next week(per his last note). Pt aware I will call her in a few hours to see how she is.   CB#[phone]

## 2018-09-19 NOTE — Telephone Encounter (Signed)
Patient called in today due to having abdominal pain. Patient states that she has been having abdominal pain since about 2:30 AM. She has a cramping feeling then pain radiates around to her back and down her right leg. Patient denies any vaginal bleeding at this time. When the pain starts her pain scale is at an 8 in the back and right leg. This woke her up out of her sleep. I advised that I would talk with Dr. Marcelline Mates when she gets in the office.

## 2018-09-19 NOTE — Unmapped (Signed)
Pt called nurse advice line with c/o abd cramping since 0230 this morning. Advised pt to go to ED if pain becomes severe or with heavy bleeding or leaking fluid. U/S scheduled for 0715 tomorrow.

## 2018-09-20 ENCOUNTER — Encounter: Admit: 2018-09-20 | Discharge: 2018-09-21 | Payer: BLUE CROSS/BLUE SHIELD

## 2018-09-20 DIAGNOSIS — Z3490 Encounter for supervision of normal pregnancy, unspecified, unspecified trimester: Principal | ICD-10-CM

## 2018-09-20 NOTE — Unmapped (Signed)
Duration of Intervention: 5 minutes    SW provided pt with RWD Parking Pass to assist with the cost of parking today.    Arlyn Dunning, MSW, LCSW   CC:  Bradly Bienenstock, LCSW-A, CHES

## 2018-09-26 ENCOUNTER — Encounter: Payer: 59 | Admitting: Obstetrics and Gynecology

## 2018-09-26 ENCOUNTER — Encounter: Admit: 2018-09-26 | Discharge: 2018-09-27 | Payer: BLUE CROSS/BLUE SHIELD

## 2018-09-26 DIAGNOSIS — O0990 Supervision of high risk pregnancy, unspecified, unspecified trimester: Secondary | ICD-10-CM

## 2018-09-26 DIAGNOSIS — B20 Human immunodeficiency virus [HIV] disease: Secondary | ICD-10-CM

## 2018-09-26 DIAGNOSIS — O0991 Supervision of high risk pregnancy, unspecified, first trimester: Principal | ICD-10-CM

## 2018-09-26 DIAGNOSIS — G932 Benign intracranial hypertension: Secondary | ICD-10-CM

## 2018-09-26 LAB — CBC
HEMATOCRIT: 41.4 % (ref 36.0–46.0)
HEMOGLOBIN: 12.8 g/dL (ref 12.0–16.0)
MEAN CORPUSCULAR HEMOGLOBIN CONC: 31 g/dL (ref 31.0–37.0)
MEAN CORPUSCULAR HEMOGLOBIN: 28.8 pg (ref 26.0–34.0)
MEAN CORPUSCULAR VOLUME: 93.1 fL (ref 80.0–100.0)
RED BLOOD CELL COUNT: 4.44 10*12/L (ref 4.00–5.20)
RED CELL DISTRIBUTION WIDTH: 14 % (ref 12.0–15.0)

## 2018-09-26 LAB — HEMOGLOBIN/THALASSEMIA PROFILE
HEMOGLOBIN A2 QUANTITATION: 3.2 % (ref 1.5–3.5)
HEMOGLOBIN F QUANTITATION: <0.4 % (ref <=1.9)

## 2018-09-26 LAB — HIV RNA, QUANTITATIVE, PCR

## 2018-09-26 LAB — HEPATITIS B SURFACE ANTIGEN: Hepatitis B virus surface Ag:PrThr:Pt:Ser:Ord:: NONREACTIVE

## 2018-09-26 LAB — MEAN PLATELET VOLUME: Lab: 8.6

## 2018-09-26 LAB — HIV RNA COMMENT: Lab: 0

## 2018-09-26 LAB — URIC ACID: Urate:MCnc:Pt:Ser/Plas:Qn:: 1.9 — ABNORMAL LOW

## 2018-09-26 LAB — HEMOGLOBIN A QUANTITATION: Lab: 96.5

## 2018-09-26 LAB — ALT (SGPT): Alanine aminotransferase:CCnc:Pt:Ser/Plas:Qn:: 10

## 2018-09-26 LAB — AST (SGOT): Aspartate aminotransferase:CCnc:Pt:Ser/Plas:Qn:: 17

## 2018-09-26 LAB — LACTATE DEHYDROGENASE: Lactate dehydrogenase:CCnc:Pt:Ser/Plas:Qn:: 285 — ABNORMAL LOW

## 2018-09-26 NOTE — Unmapped (Signed)
Patient Education        Weeks 6 to 10 of Your Pregnancy: Care Instructions  Your Care Instructions    Congratulations on your pregnancy. This is an exciting and important time for you.  During the first 6 to 10 weeks of your pregnancy, your body goes through many changes. Your baby grows very fast, even though you cannot feel it yet. You may start to notice that you feel different, both in your body and your emotions. Because each woman's pregnancy is unique, there is no right way to feel. You may feel the healthiest you have ever been, or you may feel tired or sick to your stomach (morning sickness).  These early weeks are a time to make healthy choices and to eat the best foods for you and your baby. This care sheet will give you some ideas.  This is also a good time to think about birth defects testing. These are tests done during pregnancy to look for possible problems with the baby. First trimester tests for birth defects can be done between 10 and 13 weeks of pregnancy, depending on the test. Talk with your doctor about what kinds of tests are available.  Follow-up care is a key part of your treatment and safety. Be sure to make and go to all appointments, and call your doctor if you are having problems. It's also a good idea to know your test results and keep a list of the medicines you take.  How can you care for yourself at home?  Eat well  ?? Eat at least 3 meals and 2 healthy snacks every day. Eat fresh, whole foods, including:  ? 7 or more servings of bread, tortillas, cereal, rice, pasta, or oatmeal.  ? 3 or more servings of vegetables, especially leafy green vegetables.  ? 2 or more servings of fruits.  ? 3 or more servings of milk, yogurt, or cheese.  ? 2 or more servings of meat, [place], chicken, fish, eggs, or dried beans.  ?? Drink plenty of fluids, especially water. Avoid sodas and other sweetened drinks.  ?? Choose foods that have important vitamins for your baby, such as calcium, iron, and folate.  ? Dairy products, tofu, canned fish with bones, almonds, broccoli, dark leafy greens, corn tortillas, and fortified orange juice are good sources of calcium.  ? Beef, poultry, liver, spinach, lentils, dried beans, fortified cereals, and dried fruits are rich in iron.  ? Dark leafy greens, broccoli, asparagus, liver, fortified cereals, orange juice, peanuts, and almonds are good sources of folate.  ?? Avoid foods that could harm your baby.  ? Do not eat raw or undercooked meat, chicken, or fish (such as sushi or raw oysters).  ? Do not eat raw eggs or foods that contain raw eggs, such as Caesar dressing.  ? Do not eat soft cheeses and unpasteurized dairy foods, such as Brie, feta, or blue cheese.  ? Do not eat fish that contains a lot of mercury, such as shark, swordfish, tilefish, or king mackerel. Do not eat more than 6 ounces of tuna each week.  ? Do not eat raw sprouts, especially alfalfa sprouts.  ? Cut down on caffeine, such as coffee, tea, and cola.  Protect yourself and your baby  ?? Do not touch kitty litter or cat feces. They can cause an infection that could harm your baby.  ?? High body temperature can be harmful to your baby. So if you want to use a sauna or hot  tub, be sure to talk to your doctor about how to use it safely.  Cope with morning sickness  ?? Sip small amounts of water, juices, or shakes. Try drinking between meals, not with meals.  ?? Eat 5 or 6 small meals a day. Try dry toast or crackers when you first get up, and eat breakfast a little later.  ?? Avoid spicy, greasy, and fatty foods.  ?? When you feel sick, open your windows or go for a short walk to get fresh air.  ?? Try nausea wristbands. These help some women.  ?? Tell your doctor if you think your prenatal vitamins make you sick.  Where can you learn more?  Go to [facility] at https://myuncchart.org  [employer] [facility] under [employer]. Enter G112 in the search box to learn more about Weeks 6 to 10 of Your Pregnancy: Care Instructions.  Current as of: Mar 07, 2018  Content Version: 12.3  ?? 2006-2019 [employer]wise[employer] Inc[employer]. Care instructions adapted under license by [facility]. If you have questions about a medical condition or this instruction, always ask your healthcare professional. Healthwise, Incorporated disclaims any warranty or liability for your use of this information.         Patient Education        Screening Tests for Birth Defects: Care Instructions  Your Care Instructions    Screening tests for birth defects are done during pregnancy to look for possible problems with the baby (fetus). They show the chance that a baby has a certain birth defect. Down syndrome, spina bifida, and trisomy 8 are examples. There are many types of screening tests you may have during your pregnancy. During your first trimester you may have:  ?? Blood tests at 10 to 13 weeks.  ?? Nuchal translucency test at 11 to 14 weeks.  ?? Cell free fetal DNA test at 10 weeks or later.  During your second trimester, you may have:  ?? Triple or quad screening at 15 to 20 weeks.  ?? Ultrasound at 18 to 20 weeks.  Follow-up care is a key part of your treatment and safety. Be sure to make and go to all appointments, and call your doctor if you are having problems. It's also a good idea to know your test results and keep a list of the medicines you take.  Why are these tests done?  Blood tests measure the amounts of certain substances in your blood. For these tests, a health professional takes a sample of your blood. Cell free fetal DNA test is used to help find genetic problems. Triple or quad screening are blood tests that can be used to find out if there is a risk of certain health problems. If any of these tests point to a problem, your doctor will suggest other tests to find out for sure if there is a problem.  Nuchal translucency test uses ultrasound to measure the thickness of the area at the back of the baby's neck. An increase in thickness can be an early sign of certain birth defects. Ultrasound is a tool that uses sound waves to make pictures of your baby and placenta inside the uterus.  Ultrasound lets your doctor see an image of your baby. It can help your doctor look for problems of the heart, spine, belly, or other areas.  Many pregnant women choose to have these tests done as a routine part of their care. Some choose to have tests if they are at higher risk for  having a baby with a birth defect.  How can you care for yourself at home?  ?? You can return right away to your usual activities for this stage of pregnancy, unless your doctor gives you different instructions.  ?? If you are concerned or worried about the results of your tests, talk with loved ones or friends. You can also talk to a genetic counselor or your doctor.  When should you call for help?  Watch closely for changes in your health, and be sure to contact your doctor if you have any problems.  Where can you learn more?  Go to [facility] at https://myuncchart.org  [employer] Library under [employer]. Enter H970 in the search box to learn more about Screening Tests for Birth Defects: Care Instructions.  Current as of: Mar 07, 2018  Content Version: 12.3  ?? 2006-2019 Healthwise, Inc[employer]. Care instructions adapted under license by [facility]. If you have questions about a medical condition or this instruction, always ask your healthcare professional. Healthwise, Incorporated disclaims any warranty or liability for your use of this information.

## 2018-09-26 NOTE — Unmapped (Signed)
[facility] Refill Coordination Note  Specialty Medication(s): BIKTARVY    **NOTE-- PT REPORTS SHE IS PREGNANT. (ALREADY NOTATED IN Epic, AND ID CLINIC IS AWARE, SO NOT ROUTING TO PROVIDER)      Morgan Hebert, DOB: 1986-08-09  Phone: [phone] (home) , Alternate phone contact: N/A  Phone or address changes today?: No  All above HIPAA information was verified with patient.  Shipping Address: [address]  [place] [address]   Insurance changes? No    Completed refill call assessment today to schedule patient's medication shipment from the [facility] [phone]).      Confirmed the medication and dosage are correct and have not changed: Yes, regimen is correct and unchanged.    Confirmed patient started or stopped the following medications in the past month:  No, there are no changes reported at this time.    Are you tolerating your medication?:  Morgan Hebert reports tolerating the medication.    ADHERENCE        Did you miss any doses in the past 4 weeks? No missed doses reported.    FINANCIAL/SHIPPING    Delivery Scheduled: Yes, Expected medication delivery date: 10/02/18     Medication will be delivered via Next Day Courier to the home address in [facility].    The patient will receive a drug information handout for each medication shipped and additional FDA Medication Guides as required.      Morgan Hebert did not have any additional questions at this time.    We will follow up with patient monthly for standard refill processing and delivery.      Thank you,  [name]   [facility] Specialty Technician

## 2018-09-26 NOTE — Unmapped (Signed)
Book Caring for You and Your Baby given to patient and reviewed how to contact Clinic and Nurse triage on page 2. Reviewed with patient page 3, Contacting Your Provider.  Reviewed with patient Table of Contents with specific instruction to review Common Concerns and Recommended Treatment on page 12 to be aware of over the counter medications and treatments safe in pregnancy.

## 2018-09-26 NOTE — Unmapped (Signed)
Duration of Intervention: 15 minutes    SW met with pt after ultrasound visit today. Pt is ecstatic about pregnancy and showed SW ultrasound pictures. Pt on four week f/u for OB. Pt asked SW to text next ID appt and SW did this.     SW provided pt with one RWD parking pass for visits today.     Bradly Bienenstock LCSWA, CHES

## 2018-09-26 NOTE — Unmapped (Signed)
Morgan Hebert 's Biktarvy shipment will be delayed due to Courier delivery schedule does not allow for 12/24 delivery We have contacted the patient and communicated the delivery change to patient/caregiver We will reschedule the medication for the delivery date that the patient agreed upon. We have confirmed the delivery date as 10/01/18 .

## 2018-09-27 LAB — SYPHILIS RPR SCREEN: Reagin Ab:PrThr:Pt:Ser:Ord:RPR: NONREACTIVE

## 2018-09-28 NOTE — Unmapped (Signed)
[facility]                                                                         New Prenatal Note  History present of Illness   Morgan Hebert is an 32 y.o. [id number] at [redacted]w[redacted]d with Estimated Date of Delivery: 04/25/19, who is seen in for a new OB visit. She denies vaginal bleeding or abdominal cramping.    This pregnancy is complicated by maternal HIV infection, pseudotumor cerebri, and recurrent pregnancy loss.    She has no questions of concerns today. She has follow up scheduled with her ID doctor who is aware of her pregnancy. She is very excited and hopeful for this pregnancy.     Patient Active Problem List   Diagnosis   ??? Pseudotumor cerebri   ??? HIV   ??? Lymphadenopathy   ??? Supervision of high risk pregnancy, unspecified, unspecified trimester       Obstetrical History  OB History   Gravida Para Term Preterm AB Living   10 1 1   8 1    SAB TAB Ectopic Molar Multiple Live Births   7   1     1       # Outcome Date GA Lbr Len/2nd Weight Sex Delivery Anes PTL Lv   10 Current            9 SAB 06/2018           8 SAB 2013           7 Ectopic 2013           6 SAB 2012           5 SAB 2012           4 SAB 2011           3 SAB 2010           2 SAB 2010           1 Term 06/28/06   2693 g (5 lb 15 oz) F Vag-Spont  N LIV       Gynecologic History:   Last pap smear 03/2017; wnl. Denies hx of herpes simplex virus.    Past Medical History  Past Medical History:   Diagnosis Date   ??? Abnormal mammogram    ??? Constipation    ??? Diarrhea    ??? Environmental allergies    ??? HIV (human immunodeficiency virus infection) (CMS-HCC)    ??? HIV (human immunodeficiency virus infection) (CMS-HCC)    ??? IUD (intrauterine device) in place    ??? Migraine    ??? Obesity    ??? Pseudotumor cerebri    ??? STD (sexually transmitted disease)        Past Surgical History  Past Surgical History:   Procedure Laterality Date   ??? DILATION AND CURETTAGE OF UTERUS     ??? LUMBAR PUNCTURE     ??? PR COLONOSCOPY W/BIOPSY SINGLE/MULTIPLE  07/03/2014    Procedure: COLONOSCOPY, FLEXIBLE, PROXIMAL TO SPLENIC FLEXURE; WITH BIOPSY, SINGLE OR MULTIPLE;  Surgeon: Billie Ruddy, MD;  [facility]: [facility];  Service: Gastroenterology   ??? PR DILATION/CURETTAGE,DIAGNOSTIC N/A 06/14/2018    Procedure: DILATION AND  CURETTAGE, DIAGNOSTIC AND/OR THERAPEUTIC (NON OBSTETRICAL);  Surgeon: Nelle Don, MD;  [facility]: [facility] OR [facility];  Service: [facility] Primary Gynecology   ??? PR UPPER GI ENDOSCOPY,BIOPSY N/A 07/03/2014    Procedure: UGI ENDOSCOPY; WITH BIOPSY, SINGLE OR MULTIPLE;  Surgeon: Billie Ruddy, MD;  [facility]: [facility];  Service: Gastroenterology   ??? WISDOM TOOTH EXTRACTION         Social History  Social History     Socioeconomic History   ??? Marital status: Married     Spouse name: Not on file   ??? Number of children: Not on file   ??? Years of education: Not on file   ??? Highest education level: Not on file   Occupational History   ??? Not on file   Social Needs   ??? Financial resource strain: Not on file   ??? Food insecurity:     Worry: Not on file     Inability: Not on file   ??? Transportation needs:     Medical: Not on file     Non-medical: Not on file   Tobacco Use   ??? Smoking status: Former Smoker     Types: Cigarettes   ??? Smokeless tobacco: Never Used   Substance and Sexual Activity   ??? Alcohol use: Not Currently     Alcohol/week: 0.0 standard drinks     Comment: rare   ??? Drug use: Not Currently     Types: Marijuana   ??? Sexual activity: Yes     Partners: Male   Lifestyle   ??? Physical activity:     Days per week: Not on file     Minutes per session: Not on file   ??? Stress: Not on file   Relationships   ??? Social connections:     Talks on phone: Not on file     Gets together: Not on file     Attends religious service: Not on file     Active member of club or organization: Not on file     Attends meetings of clubs or organizations: Not on file     Relationship status: Not on file Other Topics Concern   ??? Not on file   Social History Narrative    Patient lives in [place], [place] with her fiance and daughter.  She is not employed.       Medications  Outpatient Medications Marked as Taking for the 09/26/18 encounter (Initial Prenatal) with Angela Adam, DO   Medication Sig Dispense Refill   ??? bictegrav-emtricit-tenofov ala (BIKTARVY) 50-200-25 mg tablet Take 1 tablet by mouth daily. 30 tablet 11   ??? diphenhydrAMINE (BENADRYL) 25 mg capsule Take 25 mg by mouth.     ??? promethazine (PHENERGAN) 25 MG tablet Take 1 tablet (25 mg total) by mouth every eight (8) hours as needed for nausea (Nausea or headache). 90 tablet 0       Allergies  Peanut    Review of Systems  A complete ROS was performed with pertinent positives/negatives noted in the HPI. All other systems reviewed were negative.    Objective  Temp: 36.6 ??C  Heart Rate: 88  BP: 122/71  Loss of Fluid: No  Bleeding: No  Contractions: Not present    General Appearance No acute distress, well appearing, and well nourished.   Pulmonary Normal work of breathing.   Cardiovascular Normal rate.  Edema none.      Gastronintestinal Abdomen soft, gravid, no rebound or guarding.  Muskuloskeletal Normal gait, grossly normal range of motion   Neurologic Alert and oriented to person, place, and time   Psychiatric  Integumentary Mood and affect within normal limits  Skin is warm, dry, no rash noted   Imaging  See formal ultrasound report.     Laboratory results from this encounter  Results for orders placed or performed in visit on 09/26/18   Chlamydia/Gonorrhoeae NAA   Result Value Ref Range    Chlamydia trachomatis, NAA Negative Negative    Gonorrhoeae NAA Negative Negative    CT/GC Specimen Type Urine     CT/GC Specimen Source Urine    AST   Result Value Ref Range    AST 17 14 - 38 U/L   ALT   Result Value Ref Range    ALT 10 <35 U/L   CBC   Result Value Ref Range    WBC 8.7 4.5 - 11.0 10*9/L    RBC 4.44 4.00 - 5.20 10*12/L    HGB 12.8 12.0 - 16.0 g/dL    HCT 16.1 09.6 - 04.5 %    MCV 93.1 80.0 - 100.0 fL    MCH 28.8 26.0 - 34.0 pg    MCHC 31.0 31.0 - 37.0 g/dL    RDW 40.9 81.1 - 91.4 %    MPV 8.6 7.0 - 10.0 fL    Platelet 259 150 - 440 10*9/L   Uric acid   Result Value Ref Range    Uric Acid 1.9 (L) 3.0 - 6.5 mg/dL   Lactate Dehydrogenase   Result Value Ref Range    LDH 285 (L) 338 - 610 U/L   Hemoglobin/Thalassemia Profile   Result Value Ref Range    Hemoglobins Present A,A2,F A,A2,F    Hemoglobin A Quantitation 96.5 95.1 - 98.5 %    Hgb A2 Quant 3.2 1.5 - 3.5 %    Hgb F Quant <0.4 <=1.9 %    Hemoglobin Interpretation Normal hemoglobin profile      RPR   Result Value Ref Range    RPR Nonreactive Nonreactive   Hepatitis B Surface AG   Result Value Ref Range    Hep B Surface Ag Nonreactive Nonreactive   HIV RNA, Quant, PCR   Result Value Ref Range    HIV RNA Quant Result Not Detected Not Detected    HIV RNA      HIV RNA Log(10)      HIV RNA Comment     Type and Screen   Result Value Ref Range    ABO Grouping AB POS     Antibody Screen NEG    POCT Urinalysis Dipstick   Result Value Ref Range    Spec Gravity/POC 1.015 1.003 - 1.030    PH/POC 7.0 5.0 - 9.0    Leuk Esterase/POC Negative Negative    Nitrite/POC Negative Negative    Protein/POC Negative Negative    UA Glucose/POC Negative Negative    Ketones, POC Negative Negative    Bilirubin/POC Negative Negative    Blood/POC Trace-intact (A) Negative    Urobilinogen/POC 0.2 0.2 - 1.0 mg/dL       Results in Past 782 Days  Result Component Current Result   ABO Grouping AB POS (09/26/2018)   Antibody Screen NEG (09/26/2018)       Results in Past 360 Days  Result Component Current Result   Rubella IgG Scr Negative (05/30/2018)   Varicella IgG Positive (05/30/2018)   RPR Nonreactive (09/26/2018)   Gonorrhoeae NAA Negative (09/26/2018)  Chlamydia trachomatis, NAA Negative (09/26/2018)       Assessment and Plan:  KYLLIE PETTIJOHN is a 32 y.o. [id number] at [redacted]w[redacted]d who is seen to establish prenatal care.     Pseudotumor cerebri  - Currently no medications; discontinued diamox with pregnancy     HIV  - Diagnosed in 02/2014 when patient presented to the ED with adenopathy. Initial CD4 644, HIV RNA 11,000  - Patient followed closely by [facility] ID   - Last VL 05/2018 undetectable > 06/2018 <40 (patient says she did not have assistance to get her meds at that time) > 09/2018 undetectable  - Current regimen: Biktarvy     We Discussed risk of transmission to infant of up to 25% with no intervention. However that this risk can be as low at 1% with antiretrovirals and undetectable viral load. Use of ARVs in pregnancy outweighs risk of medications. Morgan Hebert has not been studied in pregnancy. However, since the patient is already taking this medication, ease of once a day administration, minimal side effects, the benefit to both mother and infant should be considered.    We discussed mode of delivery - that NSVD is possible if viral load is less than 1000.  We discussed that breastfeeding is not recommended for mothers with HIV. We can provide support on this issue if needed.  We discussed need for infant prophylaxis and lab work after delivery.    Plan:  - Continue Biktarvy  - Plan for CD4 and VL q2-3 mo, and @36  wks  - Assess Vaccine status and Hep serologies  - Targetted anatomy  - Early glucola for patients on a protease inhibitor    Supervision of high risk pregnancy, unspecified, unspecified trimester  - Initial prenatal labs sent   - Patient counseled on options for aneuploidy screening and carrier screening; she would like to discuss aneuploidy screening with her husband   - Plan for 1hr GTT at next visit    Anticipatory guidance reviewed. Specifically we discussed: foods safe to eat during pregnancy, including limiting fish high in mercury to once per week, heating deli meat and avoiding unpasteurized cheeses; not changing cat litter; and wearing gloves when gardening. We discussed that on average, women should eat 300 extra calories per day, and reviewed appropriate weight gaining guidelines based on the recent IOM report. We discussed how our MFM practice works, delivery at [facility] in [place], and the role of residents and students.    She will follow up in 4 weeks.    I spent 45 minutes in face-to-face communication with Morgan Hebert, greater than 50% of which was in counseling and education as documented above.       This patient was discussed in detail with Dr. Bronson Curb, who is in agreement with the above recommendations.      Morgan Hebert, M.D. - Clinical Instructor & Fellow  [facility] Department of Obstetrics & Gynecology??- Division of Maternal-Fetal Medicine  Pager: [phone]

## 2018-09-28 NOTE — Unmapped (Signed)
-   Currently no medications; discontinued diamox with pregnancy

## 2018-09-28 NOTE — Unmapped (Signed)
-   Initial prenatal labs sent   - Patient counseled on options for aneuploidy screening and carrier screening; she would like to discuss aneuploidy screening with her husband   - Plan for 1hr GTT at next visit

## 2018-09-28 NOTE — Unmapped (Signed)
-   Diagnosed in 02/2014 when patient presented to the ED with adenopathy. Initial CD4 644, HIV RNA 11,000  - Patient followed closely by [facility] ID   - Last VL 05/2018 undetectable > 06/2018 <40 (patient says she did not have assistance to get her meds at that time) > 09/2018 undetectable  - Current regimen: Biktarvy     We Discussed risk of transmission to infant of up to 25% with no intervention. However that this risk can be as low at 1% with antiretrovirals and undetectable viral load. Use of ARVs in pregnancy outweighs risk of medications. Susanne Borders has not been studied in pregnancy. However, since the patient is already taking this medication, ease of once a day administration, minimal side effects, the benefit to both mother and infant should be considered.    We discussed mode of delivery - that NSVD is possible if viral load is less than 1000.  We discussed that breastfeeding is not recommended for mothers with HIV. We can provide support on this issue if needed.  We discussed need for infant prophylaxis and lab work after delivery.    Plan:  - Continue Biktarvy  - Plan for CD4 and VL q2-3 mo, and @36  wks  - Assess Vaccine status and Hep serologies  - Targetted anatomy  - Early glucola for patients on a protease inhibitor

## 2018-09-29 NOTE — Unmapped (Signed)
Called patient to see how her preganncy was going. She had stopped by clinic on 12/18 but I was unable to see her at that time. She said that overall she is doing well, adherent to ART (elected to remain on Biktarvy). Felt very tired yesterday and stayed home.    She asked about her most recent VL draw - I told her it was <40 and she was very happy.    Offered sooner appt (she is scheduled 10/31/18) but she declined. She will be in touch if needed.    [name], MD, MPH   [facility]   [address], 1st floor   [place], [place]. [address]   Phone: [phone]   Fax: [phone]

## 2018-10-01 MED FILL — BIKTARVY 50 MG-200 MG-25 MG TABLET: ORAL | 30 days supply | Qty: 30 | Fill #2

## 2018-10-01 MED FILL — BIKTARVY 50 MG-200 MG-25 MG TABLET: 30 days supply | Qty: 30 | Fill #2 | Status: AC

## 2018-10-11 ENCOUNTER — Emergency Department
Admit: 2018-10-11 | Discharge: 2018-10-11 | Disposition: A | Discharge status: Home / Self Care | Payer: PRIVATE HEALTH INSURANCE

## 2018-10-11 ENCOUNTER — Encounter
Admit: 2018-10-11 | Discharge: 2018-10-11 | Disposition: A | Discharge status: Home / Self Care | Payer: BLUE CROSS/BLUE SHIELD

## 2018-10-11 DIAGNOSIS — O9989 Other specified diseases and conditions complicating pregnancy, childbirth and the puerperium: Principal | ICD-10-CM

## 2018-10-11 LAB — COMPREHENSIVE METABOLIC PANEL
ALBUMIN: 3.7 g/dL (ref 3.5–5.0)
ALKALINE PHOSPHATASE: 44 U/L (ref 38–126)
ALT (SGPT): 15 U/L (ref <35)
ANION GAP: 7 mmol/L (ref 7–15)
AST (SGOT): 24 U/L (ref 14–38)
BILIRUBIN TOTAL: <0.1 mg/dL (ref 0.0–1.2)
BUN / CREAT RATIO: 13
CALCIUM: 9.1 mg/dL (ref 8.5–10.2)
CHLORIDE: 105 mmol/L (ref 98–107)
CO2: 23 mmol/L (ref 22.0–30.0)
EGFR CKD-EPI AA FEMALE: >90 mL/min/{1.73_m2} (ref >=60)
EGFR CKD-EPI NON-AA FEMALE: >90 mL/min/{1.73_m2} (ref >=60)
GLUCOSE RANDOM: 97 mg/dL (ref 65–179)
POTASSIUM: 3.8 mmol/L (ref 3.5–5.0)
PROTEIN TOTAL: 6.7 g/dL (ref 6.5–8.3)
SODIUM: 135 mmol/L (ref 135–145)

## 2018-10-11 LAB — CBC W/ AUTO DIFF
BASOPHILS ABSOLUTE COUNT: 0 10*9/L (ref 0.0–0.1)
BASOPHILS RELATIVE PERCENT: 0.4 %
EOSINOPHILS ABSOLUTE COUNT: 0.4 10*9/L (ref 0.0–0.4)
EOSINOPHILS RELATIVE PERCENT: 4.5 %
HEMATOCRIT: 36 % (ref 36.0–46.0)
LARGE UNSTAINED CELLS: 1 % (ref 0–4)
LYMPHOCYTES ABSOLUTE COUNT: 2.5 10*9/L (ref 1.5–5.0)
LYMPHOCYTES RELATIVE PERCENT: 29 %
MEAN CORPUSCULAR HEMOGLOBIN CONC: 33.1 g/dL (ref 31.0–37.0)
MEAN CORPUSCULAR HEMOGLOBIN: 30 pg (ref 26.0–34.0)
MEAN PLATELET VOLUME: 7.1 fL (ref 7.0–10.0)
MONOCYTES RELATIVE PERCENT: 3.7 %
NEUTROPHILS ABSOLUTE COUNT: 5.2 10*9/L (ref 2.0–7.5)
NEUTROPHILS RELATIVE PERCENT: 61.3 %
PLATELET COUNT: 241 10*9/L (ref 150–440)
RED BLOOD CELL COUNT: 3.97 10*12/L — ABNORMAL LOW (ref 4.00–5.20)
RED CELL DISTRIBUTION WIDTH: 14.2 % (ref 12.0–15.0)
WBC ADJUSTED: 8.5 10*9/L (ref 4.5–11.0)

## 2018-10-11 LAB — KETONES UA: Lab: NEGATIVE

## 2018-10-11 LAB — URINALYSIS WITH CULTURE REFLEX
BILIRUBIN UA: NEGATIVE
BLOOD UA: NEGATIVE
GLUCOSE UA: NEGATIVE
KETONES UA: NEGATIVE
LEUKOCYTE ESTERASE UA: NEGATIVE
PH UA: 6 (ref 5.0–9.0)
PROTEIN UA: NEGATIVE
RBC UA: 3 /HPF (ref <4)
SPECIFIC GRAVITY UA: 1.02 (ref 1.005–1.040)
SQUAMOUS EPITHELIAL: 2 /HPF (ref 0–5)
UROBILINOGEN UA: 0.2

## 2018-10-11 LAB — PROTIME: Lab: 12

## 2018-10-11 LAB — RED BLOOD CELL COUNT: Lab: 3.97 — ABNORMAL LOW

## 2018-10-11 LAB — PROTIME-INR: PROTIME: 12 s (ref 10.2–13.1)

## 2018-10-11 LAB — PROTEIN TOTAL: Protein:MCnc:Pt:Ser/Plas:Qn:: 6.7

## 2018-10-11 LAB — BETA HCG QUANTITATIVE: Choriogonadotropin.beta subunit:ACnc:Pt:Ser/Plas:Qn:: 60000 — ABNORMAL HIGH

## 2018-10-11 LAB — LIPASE: Triacylglycerol lipase:CCnc:Pt:Ser/Plas:Qn:: 63

## 2018-10-11 NOTE — Unmapped (Signed)
Pt given a sprite 

## 2018-10-11 NOTE — Unmapped (Signed)
[facility]  Emergency Department Provider Note      ED Clinical Impression     Final diagnoses:   Abdominal pain, unspecified abdominal location (Primary)     Initial Impression, ED Course, Assessment and Plan     TIME SEEN:  10/11/2018 11:30 AM     Impression: Morgan Hebert is a 33 y.o. female who is [id number] at 12w with PMH of HIV (last CD4 952 in June 2019), pseudotumor cerebri, and recurrent pregnancy loss of unknown cause presenting to the ED for lower abdominal cramping in the setting or pregnancy.    On exam, the patient is afebrile with reassuring vitals. Well-appearing and non-toxic. Normal cardiac exam. Lungs are clear. Suprapubic tenderness to palpation. Gravid abdomen. Pelvic exam with some white-ish discharge, but no bleeding noted. Cervical os is closed. No CMT. No tenderness to palpation over uterus or ovaries.     Differential diagnosis includes UTI vs abdominal pain related to pregnancy vs vaginitis. No concern for appendicitis or acute abdominal pathology at this time.    Plan for OB POCUS and labs, including hCG quant, vaginitis, and G/C.     12:46 hCG 60,000. CBC and CMP within normal limits. Pending U/A and wetprep.    2:16 PM U/A negative. Wet prep negative. Will d/c. Provided reassurance and encouraged her to f/up with her primary OB. Strict return precautions given.      I discussed the findings of the medical work-up, my rationale behind the medical decision making process and my treatment plan with the patient. The patient understands and is comfortable with the plan of care. All questions were answered.   ____________________________________________    I have reviewed the triage vital signs and the nursing notes.  I have discussed the case with the ED Attending, Dr. Sharlette Dense.     Additional Medical Decision Making     I independently visualized the radiology images.   I reviewed the patient's prior medical records.     Any labs and radiology results that were available during my care of the patient were independently reviewed by me and considered in my medical decision making.    Portions of this record have been created using Scientist, clinical (histocompatibility and immunogenetics). Dictation errors have been sought, but may not have been identified and corrected.  ____________________________________________    I have reviewed the triage vital signs and the nursing notes.     History     Chief Complaint  Pregnancy Problem      HPI   Morgan Hebert is a 33 y.o. female who is [id number] at 12w with PMH of HIV (CD4 952), pseudotumor cerebri, and recurrent pregnancy loss of unknown cause presenting to the ED for abdominal pain. Patient reports lower abdominal cramping that woke her from sleep this morning. Patient endorses some constipation, which is typical for her. Hx of chlamydia in the past. Not currently sexually active due to high risk pregnancy. Patient denies fevers, vomiting, diarrhea, urinary symptoms, vaginal discharge or bleeding, or any other medical concerns at this time. No sick contacts. No prior abdominal surgeries.    Past Medical History:   Diagnosis Date   ??? Abnormal mammogram    ??? Constipation    ??? Diarrhea    ??? Environmental allergies    ??? HIV (human immunodeficiency virus infection) (CMS-HCC)    ??? HIV (human immunodeficiency virus infection) (CMS-HCC)    ??? IUD (intrauterine device) in place    ??? Migraine    ??? Obesity    ???  Pseudotumor cerebri    ??? STD (sexually transmitted disease)        Patient Active Problem List   Diagnosis   ??? Pseudotumor cerebri   ??? HIV   ??? Lymphadenopathy   ??? Supervision of high risk pregnancy, unspecified, unspecified trimester       Past Surgical History:   Procedure Laterality Date   ??? DILATION AND CURETTAGE OF UTERUS     ??? LUMBAR PUNCTURE     ??? PR COLONOSCOPY W/BIOPSY SINGLE/MULTIPLE  07/03/2014    Procedure: COLONOSCOPY, FLEXIBLE, PROXIMAL TO SPLENIC FLEXURE; WITH BIOPSY, SINGLE OR MULTIPLE;  Surgeon: Billie Ruddy, MD;  [facility];  Service: Gastroenterology   ??? PR DILATION/CURETTAGE,DIAGNOSTIC N/A 06/14/2018    Procedure: DILATION AND CURETTAGE, DIAGNOSTIC AND/OR THERAPEUTIC (NON OBSTETRICAL);  Surgeon: Nelle Don, MD;  [facility];  Service: [facility] Primary Gynecology   ??? PR UPPER GI ENDOSCOPY,BIOPSY N/A 07/03/2014    Procedure: UGI ENDOSCOPY; WITH BIOPSY, SINGLE OR MULTIPLE;  Surgeon: Billie Ruddy, MD;  Location: [facility];  Service: Gastroenterology   ??? WISDOM TOOTH EXTRACTION         No current facility-administered medications for this encounter.     Current Outpatient Medications:   ???  bictegrav-emtricit-tenofov ala (BIKTARVY) 50-200-25 mg tablet, Take 1 tablet by mouth daily., Disp: 30 tablet, Rfl: 11  ???  diphenhydrAMINE (BENADRYL) 25 mg capsule, Take 25 mg by mouth., Disp: , Rfl:   ???  naproxen (NAPROSYN) 500 MG tablet, Take 1 tablet (500 mg total) by mouth 2 (two) times a day with meals. (Patient not taking: Reported on 09/18/2018), Disp: 60 tablet, Rfl: 11  ???  promethazine (PHENERGAN) 25 MG tablet, Take 1 tablet (25 mg total) by mouth every eight (8) hours as needed for nausea (Nausea or headache)., Disp: 90 tablet, Rfl: 0  ???  SUMAtriptan (IMITREX) 50 MG tablet, Take 1 tablet (50 mg total) by mouth every two (2) hours as needed for migraine (Maximum 2 doses/day & 4 doses/week). (Patient not taking: Reported on 09/26/2018), Disp: 9 tablet, Rfl: 0    Allergies  Peanut    Family History   Problem Relation Age of Onset   ??? Cervical cancer Mother    ??? Diabetes Mother    ??? Asthma Mother    ??? Hypertension Mother    ??? Diabetes Maternal Aunt    ??? Hypertension Maternal Aunt    ??? Breast cancer Maternal Grandmother    ??? Cancer Maternal Grandmother    ??? No Known Problems Daughter    ??? Glaucoma Neg Hx        Social History  Social History     Tobacco Use   ??? Smoking status: Former Smoker     Types: Cigarettes   ??? Smokeless tobacco: Never Used   Substance Use Topics   ??? Alcohol use: Not Currently Alcohol/week: 0.0 standard drinks     Comment: rare   ??? Drug use: Not Currently     Types: Marijuana       Review of Systems  Constitutional: Negative for fever.  Eyes: Negative for visual changes.  ENT: Negative for sore throat.  Cardiovascular: Negative for chest pain.  Respiratory: Negative for shortness of breath.  Gastrointestinal: Positive for abdominal pain. Negative for vomiting or diarrhea.  Genitourinary: Negative for dysuria.  Musculoskeletal: Negative for back pain.  Skin: Negative for rash.  Neurological: Negative for headaches, focal weakness or numbness.    Physical  Exam     ED Triage Vitals [10/11/18 1047]   Enc Vitals Group      BP 115/63      Pulse       SpO2 Pulse 107      Resp 18      Temp 36.8 ??C (98.3 ??F)      Temp Source Oral      SpO2 99 %     Constitutional: Alert and oriented. Well appearing and in no distress.  Eyes: Conjunctivae are normal.  ENT       Head: Normocephalic and atraumatic.       Nose: No congestion.       Mouth/Throat: Mucous membranes are moist.       Neck: No stridor.  Hematological/Lymphatic/Immunilogical: No cervical lymphadenopathy.  Cardiovascular: Normal rate, regular rhythm. Normal and symmetric distal pulses are present in all extremities.  Respiratory: Normal respiratory effort. Breath sounds are normal.  Gastrointestinal: Suprapubic tenderness to palpation. Gravid abdomen. . There is no CVA tenderness.  Pelvic: some white-ish discharge, but no bleeding noted. Cervical os is closed. No CMT. No tenderness to palpation over uterus or ovaries.   Musculoskeletal: Normal range of motion in all extremities.       Right lower leg: No tenderness or edema.       Left lower leg: No tenderness or edema.  Neurologic: Normal speech and language. No facial droop. Moving all extremities spontaneously and equally.  Skin: Skin is warm, dry and intact. No rash noted.  Psychiatric: Mood and affect are normal. Speech and behavior are normal.    Radiology     [facility] Ob Ultrasound Result Date: 10/11/2018  Limited Obstetric Ultrasound Indication: A focused ultrasound exam of the pelvis using a: TRANSABDOMINAL approach, was performed to evaluate for intra-uterine pregnancy (IUP) versus signs of ectopic pregnancy in the patient. The ultrasound was performed with the following indications, as noted in the H&P, in a patient with a positive pregnancy test and the following:Pelvic or abdominal pain Identified structures: Uterus & Pouch of [place] Findings: Exam of the above structures revealed the following findings:  Uterus: Definitive IUP:  Yes  Free fluid in [place]: Absent  Fetal Heart Rate (beats/min): 156 (clip with M-mode and HR did not save) Impression: IUP. Interpreted by: Francis Gaines, MD     [phone]. Interpreted by: Francis Gaines, MD       I independently visualized these images.    ______________________________________________________   Documentation assistance was provided by Rolan Lipa, Scribe, on October 11, 2018 at 12:03 PM for Annia Belt, MD.    Documentation assistance was provided by the scribe in my presence.  The documentation recorded by the scribe has been reviewed by me and accurately reflects the services I personally performed.           Francis Gaines, MD  Resident  10/11/18 [phone]

## 2018-10-11 NOTE — Unmapped (Signed)
Assumed care at this time,RN introduced her name to patient and family. Per patient this her 10th pregnancy and have 1 daughter (33 years old). Patient stated abdominal cramps since 5 am today, denies vag bleeding/discharge. Patient is ready for pelvic set up

## 2018-10-11 NOTE — Unmapped (Addendum)
Pt presents with pelvic cramping since this morning. Pt states it woke her up. Pt denies any vaginal discharge, bleeding or urinary concerns. G9P1

## 2018-10-12 ENCOUNTER — Ambulatory Visit (INDEPENDENT_AMBULATORY_CARE_PROVIDER_SITE_OTHER): Payer: 59 | Admitting: Obstetrics and Gynecology

## 2018-10-12 ENCOUNTER — Encounter: Payer: Self-pay | Admitting: Obstetrics and Gynecology

## 2018-10-12 VITALS — BP 105/74 | HR 94 | Wt 177.1 lb

## 2018-10-12 DIAGNOSIS — R102 Pelvic and perineal pain: Secondary | ICD-10-CM | POA: Diagnosis not present

## 2018-10-12 NOTE — Progress Notes (Signed)
Patient is having abdominal cramping since 5 AM yesterday morning. Patient went to [facility] ED yesterday. Denies any vaginal bleeding.

## 2018-10-12 NOTE — Progress Notes (Signed)
HPI:      Amber Key is a 33 y.o. [id number] who LMP was Patient's last menstrual period was 07/14/2018 (exact date).  Subjective:   She presents today stating that she has had pelvic cramping over the last several days.  She denies vaginal bleeding.  She was seen in the [facility] emergency department yesterday and had an ultrasound showing a viable 12-week intrauterine pregnancy.  The patient's pregnancy history is significant for multiple miscarriages. Also of significant note patient had a previous ectopic pregnancy Also of significant note patient is HIV positive.  With CD4 count greater than 900.    Hx: The following portions of the patient's history were reviewed and updated as appropriate:             She  has a past medical history of Enlarged lymph nodes (unk), HIV (human immunodeficiency virus infection) ([place]), Lymphoma ([place]), Memory loss, Migraines, Pseudotumor cerebri (unk), and Vision loss. She does not have any pertinent problems on file. She  has a past surgical history that includes Colonoscopy; Upper gi endoscopy; wisdom teeth; Lumbar puncture; and No past surgeries. Her family history includes Breast cancer in her maternal grandmother and mother; Diabetes in her maternal aunt; Hypertension in her maternal aunt; Ovarian cancer in her maternal aunt, maternal grandmother, and mother. She  reports that she has been smoking e-cigarettes and cigarettes. She has never used smokeless tobacco. She reports current alcohol use. She reports current drug use. Frequency: 7.00 times per week. Drug: Marijuana. She has a current medication list which includes the following prescription(s): acetazolamide, bictegravir-emtricitabine-tenofovir af, diphenhydramine, naproxen, promethazine, and sumatriptan. She is allergic to peanut-containing drug products.       Review of Systems:  Review of Systems  Constitutional: Denied constitutional symptoms, night sweats, recent illness, fatigue, fever,  insomnia and weight loss.  Eyes: Denied eye symptoms, eye pain, photophobia, vision change and visual disturbance.  Ears/Nose/Throat/Neck: Denied ear, nose, throat or neck symptoms, hearing loss, nasal discharge, sinus congestion and sore throat.  Cardiovascular: Denied cardiovascular symptoms, arrhythmia, chest pain/pressure, edema, exercise intolerance, orthopnea and palpitations.  Respiratory: Denied pulmonary symptoms, asthma, pleuritic pain, productive sputum, cough, dyspnea and wheezing.  Gastrointestinal: Denied, gastro-esophageal reflux, melena, nausea and vomiting.  Genitourinary: Denied genitourinary symptoms including symptomatic vaginal discharge, pelvic relaxation issues, and urinary complaints.  Musculoskeletal: Denied musculoskeletal symptoms, stiffness, swelling, muscle weakness and myalgia.  Dermatologic: Denied dermatology symptoms, rash and scar.  Neurologic: Denied neurology symptoms, dizziness, headache, neck pain and syncope.  Psychiatric: Denied psychiatric symptoms, anxiety and depression.  Endocrine: Denied endocrine symptoms including hot flashes and night sweats.   Meds:   Current Outpatient Medications on File Prior to Visit  Medication Sig Dispense Refill  . acetaZOLAMIDE (DIAMOX) 500 MG capsule Take 500 mg by mouth 2 (two) times daily.    . bictegravir-emtricitabine-tenofovir AF (BIKTARVY) 50-200-25 MG TABS tablet Take by mouth daily.    . diphenhydrAMINE (BENADRYL) 25 mg capsule Take 25 mg by mouth every 6 (six) hours as needed.    . naproxen (NAPROSYN) 500 MG tablet Take 500 mg by mouth 2 (two) times daily with a meal.    . promethazine (PHENERGAN) 25 MG tablet Take 25 mg by mouth every 6 (six) hours as needed for nausea or vomiting.    . SUMAtriptan (IMITREX) 50 MG tablet Take 50 mg by mouth every 2 (two) hours as needed for migraine. May repeat in 2 hours if headache persists or recurs.     No current facility-administered medications on  file prior to visit.      Objective:     Vitals:   10/12/18 0843  BP: 105/74  Pulse: 94              [facility] ED notes reviewed in detail with the patient.  Assessment:    [id number] Patient Active Problem List   Diagnosis Date Noted  . Pregnancy of unknown anatomic location 05/21/2018  . Abnormal mammogram 06/26/2014  . Adenopathy 02/11/2014  . Human immunodeficiency virus (HIV) infection ([place]) 02/10/2014  . Headache, migraine 12/13/2013  . Benign intracranial hypertension 12/13/2013     1. Pelvic pain in female     This has resolved-patient is [redacted] weeks pregnant.   Plan:            1.  Referral to [facility] (patient requests this and was very happy with her care last time.)  4 pregnancy and delivery planning.  Orders Orders Placed This Encounter  Procedures  . AMB referral to maternal fetal medicine    No orders of the defined types were placed in this encounter.     F/U  No follow-ups on file. I spent 16 minutes involved in the care of this patient of which greater than 50% was spent discussing patient's emergency department visit, multiple miscarriages, history of ectopic pregnancy, HIV status, management of this pregnancy and referral to [facility].  Finis Bud, M.D. 10/12/2018 9:20 AM

## 2018-10-15 ENCOUNTER — Other Ambulatory Visit: Payer: Self-pay | Admitting: Obstetrics and Gynecology

## 2018-10-15 DIAGNOSIS — N96 Recurrent pregnancy loss: Secondary | ICD-10-CM

## 2018-10-15 DIAGNOSIS — O98711 Human immunodeficiency virus [HIV] disease complicating pregnancy, first trimester: Secondary | ICD-10-CM

## 2018-10-16 ENCOUNTER — Telehealth: Payer: Self-pay | Admitting: Obstetrics and Gynecology

## 2018-10-16 NOTE — Telephone Encounter (Signed)
Called patient and lvm to notify her that she has an appointment at [facility] on 10/25/18. No other information was disclosed. Thank you.

## 2018-10-17 ENCOUNTER — Encounter: Payer: Self-pay | Admitting: Surgical

## 2018-10-17 MED ORDER — PROMETHAZINE 25 MG TABLET
ORAL_TABLET | Freq: Three times a day (TID) | ORAL | 0 refills | 0 days | Status: CP | PRN
Start: 2018-10-17 — End: 2018-11-13

## 2018-10-17 NOTE — Unmapped (Signed)
Faxed refill request received from pharmacy: [facility]    Last Visit Date: 08/22/2018     Next Visit Date: 01/23/2019

## 2018-10-22 ENCOUNTER — Other Ambulatory Visit: Payer: Self-pay

## 2018-10-22 ENCOUNTER — Encounter
Admit: 2018-10-22 | Discharge: 2018-10-23 | Payer: BLUE CROSS/BLUE SHIELD | Attending: Obstetrics & Gynecology | Primary: Obstetrics & Gynecology

## 2018-10-22 DIAGNOSIS — B20 Human immunodeficiency virus [HIV] disease: Secondary | ICD-10-CM

## 2018-10-22 DIAGNOSIS — G932 Benign intracranial hypertension: Principal | ICD-10-CM

## 2018-10-22 DIAGNOSIS — O0990 Supervision of high risk pregnancy, unspecified, unspecified trimester: Secondary | ICD-10-CM

## 2018-10-22 LAB — HEMOGLOBIN A1C: Hemoglobin A1c/Hemoglobin.total:MFr:Pt:Bld:Qn:: 5.2

## 2018-10-22 NOTE — Unmapped (Signed)
No OB complaints  - Discussed HbA1c to be collected today  - Desires genetic counseling - referral placed  - Anticipate targeted anatomy between 18-22 wga

## 2018-10-22 NOTE — Unmapped (Signed)
Addended by: Charlesetta Ivory on: 10/22/2018 09:39 AM     Modules accepted: Orders

## 2018-10-22 NOTE — Unmapped (Signed)
Duration of Intervention: 5 Minutes    SW provided RWD parking pass to assist with the cost of parking following OB/GYN appt.    Arlyn Dunning, MSW, LCSW

## 2018-10-22 NOTE — Unmapped (Addendum)
Doing well with regimen - no missed doses. Reviewed last VL 09/2018.   - CD4 and remainder of infectious labs (CMV, HSV, hepatitis A/C, etc) ordered today   - Appt with ID on 10/31/2018

## 2018-10-22 NOTE — Unmapped (Addendum)
No change in quality of headaches and no visual field changes. Continues to take tylenol and promethazine, only uses imitrex as abortive therapy.   - Reviewed TWG of 2#

## 2018-10-22 NOTE — Unmapped (Signed)
[facility]                                                                         Return Prenatal Note    Assessment/Plan:   Plan   33 y.o. [id number] at [redacted]w[redacted]d presents for follow-up OB visit for high risk pregnancy. Reviewed prenatal record. Doing well.    Problem List Items Addressed This Visit        Nervous and Auditory    Pseudotumor cerebri     No change in quality of headaches and no visual field changes. Continues to take tylenol and promethazine, only uses imitrex as abortive therapy.   - Reviewed TWG of 2#            Other    HIV - Primary (Chronic)     Doing well with regimen - no missed doses. Reviewed last VL 09/2018.   - CD4 and remainder of infectious labs (CMV, HSV, hepatitis A/C, etc) ordered today   - Appt with ID on 10/31/2018           Relevant Orders    Hepatitis C Antibody    Hepatitis A Antibody, IgM    Toxoplasma IgG    CMV IgG    Lymphocyte Markers Limited    HSV Antibody, IgG    Hemoglobin A1c    Supervision of high risk pregnancy, unspecified, unspecified trimester     No OB complaints  - Discussed HbA1c to be collected today  - Desires genetic counseling - referral placed  - Anticipate targeted anatomy between 18-22 wga         Relevant Orders    Ambulatory referral to Reproductive Genetics            Return in about 4 weeks (around 11/19/2018).           I spent 15 minutes in face-to-face communication with Ms. Kirstein, greater than 50% of which was in counseling and education as above.       Dr. Stefanie Libel was immediately available for the care of this patient  Margaretha Mahan, MD MPH    Subjective:        33 y.o. [id number] at [redacted]w[redacted]d presents for follow-up OB visit for high risk pregnancy. Patient reports no FM yet and denies contractions, leakage of fluid, or vaginal bleeding. Endorses mild fatigue as well as her baseline nausea. Is not having issues with food intake or subsequent emesis.     States that she is taking ARVs daily without any missed doses - is highly motivated as she does not want to transmit HIV to her baby. No other issues today.         Objective:        The following portions of the patient's history were reviewed and updated as appropriate: allergies, current medications, past obstetric history, past family history, past medical history, past social history, past surgical history and problem list    Flowsheet Vitals:  Temp: 36.6 ??C  Heart Rate: 104  BP: 110/76  Loss of Fluid: No  Bleeding: No  Contractions: Not present  Total weight gain: 0.953 kg (2 lb 1.6 oz)    General Appearance No acute distress, well appearing, and well  nourished   Pulmonary Normal work of breathing   Cardiovascular Normal rate  Edema none      Gastronintestinal Abdomen soft, gravid, no rebound or guarding.   Genitourinary Deferred SVE:  , No fundal tenderness   Muskuloskeletal Normal gait, grossly normal ROM   Neurologic Alert and oriented to person, place, and time   Psychiatric Mood and affect within normal limits    FHT 150

## 2018-10-23 LAB — LYMPH MARKER LIMITED,FLOW
ABSOLUTE CD4 CNT: 1118 {cells}/uL (ref 510–2320)
ABSOLUTE CD8 CNT: 497 {cells}/uL (ref 180–1520)
CD4% (T HELPER)": 54 % (ref 34–58)

## 2018-10-23 LAB — ABSOLUTE CD4 CNT: Cells.CD3+CD4+:NCnc:Pt:XXX:Qn:: 1118

## 2018-10-23 LAB — HEPATITIS A IGM ANTIBODY: Hepatitis A virus Ab.IgM:PrThr:Pt:Ser:Ord:: NONREACTIVE

## 2018-10-23 LAB — CMV IGG: Lab: POSITIVE — AB

## 2018-10-23 LAB — TOXOPLASMA GONDII IGG: Lab: NEGATIVE

## 2018-10-23 LAB — HSV ANTIBODIES, IGG: HERPES SIMPLEX VIRUS 2 IGG: POSITIVE — AB

## 2018-10-23 LAB — HEPATITIS C ANTIBODY: Hepatitis C virus Ab:PrThr:Pt:Ser:Ord:: NONREACTIVE

## 2018-10-23 LAB — HERPES SIMPLEX VIRUS 1 IGG: Lab: POSITIVE — AB

## 2018-10-23 NOTE — Unmapped (Signed)
I discussed the patient with the resident.I reviewed the resident???s note.  I agree with the resident???s findings and plan.   Florencia Reasons, MD

## 2018-10-24 ENCOUNTER — Telehealth: Payer: Self-pay | Admitting: Obstetrics and Gynecology

## 2018-10-24 MED ORDER — SUMATRIPTAN 50 MG TABLET
ORAL_TABLET | ORAL | 2 refills | 0.00000 days | Status: CP | PRN
Start: 2018-10-24 — End: 2019-02-14

## 2018-10-24 NOTE — Telephone Encounter (Signed)
Barbara from scheduling called and stated that Loma Sousa from [facility] said that this patient cannot come for the consult that was recently scheduled and will need to come see Dr. Lehman Prom on Nov 12, 2018, as that is when he will be back in the office.  Also, the detailed visit has been scheduled for 2/20 at 8 AM.  The patient's appointment tomorrow has to be cancelled, and Pamala Hurry said she left several messages for the patient but she has not heard back from her.  She asked if a MyChart message could be sent about the appointment tomorrow, and that the provider is not going to be available until Feb 3.  Please advise, thanks.

## 2018-10-25 ENCOUNTER — Ambulatory Visit: Payer: 59

## 2018-10-25 ENCOUNTER — Telehealth: Payer: Self-pay | Admitting: Obstetrics and Gynecology

## 2018-10-25 NOTE — Telephone Encounter (Signed)
Please advise on reducing patients work schedule.

## 2018-10-25 NOTE — Telephone Encounter (Signed)
Patient came to the office this morning requesting a note to reduce her hours to 30 hours per week. She requests to only work from 8am-2pm. She states she needs reduced work hours due to seeing 3 different doctor offices. She would like a call back.Thanks

## 2018-10-25 NOTE — Unmapped (Signed)
Faxed refill request received from pharmacy: [facility]    Last Visit Date: 08/22/2018     Next Visit Date: 01/23/2019

## 2018-10-26 NOTE — Telephone Encounter (Signed)
Not able to reach patient

## 2018-10-26 NOTE — Telephone Encounter (Signed)
Lm on patients phone that form has been filled out and will be up front for her to pick up.

## 2018-10-30 NOTE — Unmapped (Signed)
[facility] Refill and Clinical Coordination Note: HIV     HIV Medication(s): Biktarvy  Additional Medication(s): none    Morgan Hebert, DOB: 1986-03-06  Phone: [phone] (home) , Alternate phone contact: N/A  Shipping address: [address]  [place] [address]  Phone or address changes today?: No  All above HIPAA information verified.  Insurance changes? No    Completed refill and clinical call assessment today to schedule patient's medication shipment from the [facility] [phone]).      MEDICATION RECONCILIATION    Confirmed the medication and dosage are correct and have not changed: Yes, regimen is correct and unchanged.    Were there any changes to your medication(s) in the past month:  No, there are no changes reported at this time.    HIV-related labs:  Lab Results   Component Value Date/Time    HIVRS Not Detected 09/26/2018 11:07 AM    HIVRS Detected (A) 07/04/2018 02:06 PM    HIVRS Not Detected 05/25/2018 03:14 PM    HIVRS 42,400 10/23/2017    HIVRS not detected 03/29/2017    HIVRS detected 10/25/2016    HIVCP <40 (H) 07/04/2018 02:06 PM    HIVCP detected 10/23/2017    HIVCP <40 (H) 05/03/2017 02:29 PM    HIVCP <20 03/29/2017    HIVCP <20 10/25/2016    HIV10  07/04/2018 02:06 PM      Comment:      <1.6 log    HIV10  05/03/2017 02:29 PM      Comment:      <1.6 log    HIV10  12/02/2015 12:28 PM      Comment:      <1.6 log    HIV10 <1.60 03/25/2014 03:23 PM    HIV10 4.07 02/13/2014 12:32 PM    HIVCM  09/26/2018 11:07 AM      Comment:      HIV-1 quantification by real-time RT-PCR is performed using the Abbott RealTime  HIV-1 test. This test is FDA approved and can quantitate HIV-1 RNA over the  range of 40 - 1,000,000 copies/mL (1.60 log(10) -6.00 log(10) copies/mL). The reference range for this assay is Not Detected.    HIVCM  07/04/2018 02:06 PM      Comment:      HIV-1 quantification by real-time RT-PCR is performed using the Abbott RealTime  HIV-1 test. This test is FDA approved and can quantitate HIV-1 RNA over the  range of 40 - 1,000,000 copies/mL (1.60 log(10) -6.00 log(10) copies/mL). The reference range for this assay is Not Detected.    HIVCM  05/25/2018 03:14 PM      Comment:      HIV-1 quantification by real-time RT-PCR is performed using the Abbott RealTime  HIV-1 test. This test is FDA approved and can quantitate HIV-1 RNA over the  range of 40 - 1,000,000 copies/mL (1.60 log(10) -6.00 log(10) copies/mL). The reference range for this assay is Not Detected.    HIVCM : 06/23/2014 12:26 PM    HIVCM : 03/25/2014 03:23 PM    HIVCM : 02/13/2014 12:32 PM    RCD4 54.4 05/01/2015    RCD4 53.7 10/24/2014    ACD4 1,118 10/22/2018 09:45 AM    ACD4 1,696 (A) 05/01/2015    ACD4 967 10/24/2014    ACD4 854 06/23/2014 12:26 PM         ADHERENCE      Number of tablets of HIV Medication(s) at home: 6  Did you miss any doses in the past 4 weeks? No missed doses reported.  Adherence counseling provided? Not needed     SIDE EFFECT MANAGEMENT    Are you tolerating your medication?:  Morgan Hebert reports tolerating the medication.  Side effect management discussed: None      Evidence of clinical benefit: See Epic note from 09/26/2018      Therapy is appropriate and should be continued.      FINANCIAL/SHIPPING    Delivery Scheduled: Yes, Expected medication delivery date: 11/01/2018     Medication will be delivered via [employer]ourier to the home address in [facility].    Additional medications refilled: No additional medications/refills needed at this time.    The patient will receive a drug information handout for each medication shipped and additional FDA Medication Guides as required.      Morgan Hebert did not have any additional questions at this time.    Delivery address confirmed in Epic.     We will follow up with patient monthly for standard refill processing and delivery.      Thank you,  Roderic Palau   [facility] Specialty Pharmacist

## 2018-10-31 ENCOUNTER — Encounter: Admit: 2018-10-31 | Discharge: 2018-11-01 | Payer: BLUE CROSS/BLUE SHIELD

## 2018-10-31 DIAGNOSIS — O2620 Pregnancy care for patient with recurrent pregnancy loss, unspecified trimester: Secondary | ICD-10-CM

## 2018-10-31 DIAGNOSIS — B20 Human immunodeficiency virus [HIV] disease: Principal | ICD-10-CM

## 2018-10-31 DIAGNOSIS — R21 Rash and other nonspecific skin eruption: Secondary | ICD-10-CM

## 2018-10-31 MED ORDER — KETOCONAZOLE 2 % TOPICAL CREAM
Freq: Every day | TOPICAL | 11 refills | 0.00000 days | Status: CP
Start: 2018-10-31 — End: 2019-03-31

## 2018-10-31 MED ORDER — BICTEGRAVIR 50 MG-EMTRICITABINE 200 MG-TENOFOVIR ALAFENAM 25 MG TABLET
ORAL_TABLET | Freq: Every day | ORAL | 11 refills | 30 days | Status: CP
Start: 2018-10-31 — End: 2019-10-31
  Filled 2018-11-28: qty 30, 30d supply, fill #0

## 2018-10-31 MED ORDER — HYDROCORTISONE 1 % TOPICAL OINTMENT
Freq: Two times a day (BID) | TOPICAL | 11 refills | 0 days | Status: CP
Start: 2018-10-31 — End: 2019-03-31

## 2018-10-31 MED FILL — BIKTARVY 50 MG-200 MG-25 MG TABLET: 30 days supply | Qty: 30 | Fill #3 | Status: AC

## 2018-10-31 MED FILL — BIKTARVY 50 MG-200 MG-25 MG TABLET: ORAL | 30 days supply | Qty: 30 | Fill #3

## 2018-10-31 NOTE — Unmapped (Signed)
Assessment/Plan:      Morgan Hebert, a 33 y.o. female seen today for routine HIV followup.    Plan:  HIV  Fills ART via private insurance. Due for HMAP renewal.  ?? Continue current therapy. E-prescribed at recent visit, no refills needed.   ?? Checking HIV RNA & safety labs (brief return)    ?? Encouraged continued excellent ARV adherence.  Lab Results   Component Value Date    ACD4 1,118 10/22/2018    CD4 54 10/22/2018    HIVCP <40 (H) 07/04/2018    HIVRS Not Detected 09/26/2018   ?? Discussed decline in CD4 count over past year from 1300-->900 with associated decrease in % and what this means in terms of reflecting immunologic damage from being off ART. Now back up to 1100 with robust %. Discussed that we sometimes see some CD4 suppression (total #) in pregnancy but that this is not of clinical significance as long as she remains on ART and durably suppressed.   ?? I don't see an indication for CD4 recheck in pregnancy - only reason would be to see if she needed OI prophylaxis which is not likely given high numbers.  ?? Would just follow viral load.    Pregnancy  ?? [id number], [redacted]w[redacted]d today, EDD 04/25/19  ?? Will see her every 58m or so during pregnancy  ?? Reinforced need for ART adherence.     Pseudotumor cerebri  ?? Working with neurologists, symptoms are stable  ?? For ophtho followup.    Skin lesion  ?? To try antifungal cream, rxed today.    Sexual health & secondary prevention  Sex with female partner (husband). Monogamous with single partner. She does disclose status. Never uses condoms.    Lab Results   Component Value Date    RPR Nonreactive 09/26/2018    LABRPR NON-REACTIVE 02/13/2014    CTNAA Negative 10/11/2018    CTNAA Negative 09/26/2018    CTNAA Negative 03/01/2017    GCNAA Negative 10/11/2018    GCNAA Negative 09/26/2018    GCNAA Negative 03/01/2017    SPECTYPE Swab 10/11/2018    SPECTYPE Urine 09/26/2018    SPECTYPE Urine 03/01/2017    SPECSOURCE Endocervix 10/11/2018    SPECSOURCE Urine 09/26/2018    SPECSOURCE Urine 03/01/2017     ?? GC/CT NAATs -- performed 09/2018   ?? RPR -- NR 09/2018 - repeat 1Y      Health maintenance  Lab Results   Component Value Date    CREATININE 0.54 (L) 10/11/2018    QFTTBGOLD NEGATIVE 02/13/2014    HEPCAB Nonreactive 10/22/2018    CHOL 140 03/28/2018    HDL 42 03/28/2018    LDL 78 03/28/2018    NONHDL 98 03/28/2018    TRIG 99 03/28/2018    A1C 5.2 10/22/2018    PAP Negative for intraephithelial lesion or malignancy 03/29/2017    FINALDX  06/14/2018     A: Products of conception, removal  -Immature and hydropic chorionic villi, inflamed gestational endometrium and decidua with necrosis, most  consistent with hydropic abortus (see comment)  - No diagnostic gestational trophoblastic disease identified (correlate with molecular studies)    This electronic signature is attestation that the pathologist personally reviewed the submitted material(s) and the final diagnosis reflects that evaluation.       Communicable diseases  # TB - no longer needed; negative IGRA 2015 and low/no risk  # HCV - negative 2018; rescreen w/Ab q1-2y    Cancer screening  # Anorectal -  not yet done  # Colorectal - SCREEN AGE 39+  # Liver - not yet done  # Lung - not applicable    # Breast - SCREEN AGE 39+ -- Q1-2Y  # Cervical - neg cyto June 2018 through [facility]. Due for repeat June 2020.    Cardiovascular disease  # The ASCVD Risk score Denman George DC Jorge Ny al., 2013) failed to calculate.    Immunization History   Administered Date(s) Administered   ??? Influenza Vaccine Quad (IIV4 PF) 62mo+ injectable 06/30/2015, 06/15/2016, 07/04/2018     ?? Screening ordered today: none  ?? Immunizations ordered today: none    I personally spent 30 minutes face-to-face with the patient and greater than 50% of that time was spent in counseling or coordinating care with the patient regarding meaning of lab results, indications for CD4 testing, adherence to ART, skin lesion.     Counseled as documented above regarding medication adherence. Disposition  Return to clinic 3-4 months or sooner if needed.    [name], MD, MPH   [facility]   [address], 1st floor   [place], [place]. [address]   Phone: [phone]   Fax: [phone]      Subjective:      Chief Complaint   HIV followup    HPI  Return patient visit for Morgan Hebert, a 33 y.o. woman with now well-controlled HIV.  Pregnancy going well, 15 weeks tomorrow. EDD 04/25/19. Feels well, just tired.  Lots of stress with her job but sticking with it.    Past Medical History:   Diagnosis Date   ??? Abnormal mammogram    ??? Constipation    ??? Diarrhea    ??? Environmental allergies    ??? HIV (human immunodeficiency virus infection) (CMS-HCC)    ??? HIV (human immunodeficiency virus infection) (CMS-HCC)    ??? IUD (intrauterine device) in place    ??? Migraine    ??? Obesity    ??? Pseudotumor cerebri    ??? STD (sexually transmitted disease)      Medications and Allergies   Reviewed and updated today. See bottom of this visit's encounter summary for details.  Current Outpatient Medications on File Prior to Visit   Medication Sig   ??? bictegrav-emtricit-tenofov ala (BIKTARVY) 50-200-25 mg tablet Take 1 tablet by mouth daily.   ??? diphenhydrAMINE (BENADRYL) 25 mg capsule Take 25 mg by mouth.   ??? promethazine (PHENERGAN) 25 MG tablet Take 1 tablet (25 mg total) by mouth every eight (8) hours as needed for nausea (Nausea or headache).   ??? SUMAtriptan (IMITREX) 50 MG tablet Take 1 tablet (50 mg total) by mouth every two (2) hours as needed for migraine (Maximum 2 doses/day & 4 doses/week).   ??? naproxen (NAPROSYN) 500 MG tablet Take 1 tablet (500 mg total) by mouth 2 (two) times a day with meals. (Patient not taking: Reported on 09/18/2018)     No current facility-administered medications on file prior to visit.      Allergies   Allergen Reactions   ??? 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     Social History  General ??? lives in [place] with her husband and her 1 daughter (born 2008). There is financial tension.   ?? Doreene Adas and his girlfriend are there sometimes as well.   ?? Mom and sister are both positive (sister is perinatally infected, patient with presumed sexual transmission  from female partner).  ?? 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  Sexual History ??? sex with men (husband only)  Substance Use ??? marijuana (smokes nightly)  Social History     Tobacco Use   ??? Smoking status: Former Smoker     Types: Cigarettes   ??? Smokeless tobacco: Never Used   Substance Use Topics   ??? Alcohol use: Not Currently     Alcohol/week: 0.0 standard drinks     Comment: rare     Review of Systems  As per HPI. Remainder of 10 systems reviewed, negative.        Objective:      BP 97/61  - Pulse 101  - Temp 36.6 ??C (97.8 ??F) (Oral)  - Resp 18  - Ht 149.9 cm (4' 11)  - Wt 80.3 kg (177 lb)  - LMP 07/14/2018  - BMI 35.75 kg/m??   Wt Readings from Last 3 Encounters:   10/31/18 80.3 kg (177 lb)   10/22/18 79.9 kg (176 lb 1.6 oz)   09/26/18 78.6 kg (173 lb 3.2 oz)     Const looks well and attentive, alert, appropriate   Eyes sclerae anicteric, noninjected OU   ENT dentition good   Lymph no cervical or supraclavicular LAD   CV RRR. No murmurs. No rub or gallop. S1/S2.   Resp CTAB ant/post, normal work of breathing   GI Soft, gravid, no organomegaly. NTND. NABS.   GU deferred   Rectal deferred   Skin left back - 2 cm flaking macular lesion (pictured) without satellite lesions   MSK full exam deferred   Neuro CN II-XII grossly intact, MAEE, non focal   Psych Appropriate affect. Eye contact good. Linear thoughts. Fluent speech.           Laboratory Data  Reviewed in Epic today, using Synopsis and Chart Review filters.    Lab Results   Component Value Date    CREATININE 0.54 (L) 10/11/2018    QFTTBGOLD NEGATIVE 02/13/2014    HEPCAB Nonreactive 10/22/2018    CHOL 140 03/28/2018    HDL 42 03/28/2018    LDL 78 03/28/2018 NONHDL 98 03/28/2018    TRIG 99 03/28/2018    A1C 5.2 10/22/2018    PAP Negative for intraephithelial lesion or malignancy 03/29/2017    FINALDX  06/14/2018     A: Products of conception, removal  -Immature and hydropic chorionic villi, inflamed gestational endometrium and decidua with necrosis, most  consistent with hydropic abortus (see comment)  - No diagnostic gestational trophoblastic disease identified (correlate with molecular studies)    This electronic signature is attestation that the pathologist personally reviewed the submitted material(s) and the final diagnosis reflects that evaluation.              _____________________________________________________________________

## 2018-10-31 NOTE — Unmapped (Signed)
It was great to see you today.  - for rash on your back, try the ketoconazole (antifungal) cream daily for 2 weeks.  - If not improved switch to cortisone ointment twice a day.    Contacting us   During working hours  [phone]  After hours or weekends [phone] and ask for the ID doctor on call  Fax number   [phone]    MEDICATIONS  For refills, please contact your pharmacy and ask them to electronically send or fax the request to the clinic.     Please bring all medications in original bottles to every appointment.    HMAP (formerly ADAP) or [employer] Eligibility (required even if you do not receive medication through [facility])  Please remember to renew your Juanell Fairly eligibility during renewal periods which occur twice a year: January-March and July-September.     The following are needed for each renewal:   - [facility] Identification (if you don't have one, then a bill with your name and address in [place])   - proof of income (award letter, W-2, or last three check stubs)   If you are unable to come in for renewal, let us know if we can mail, fax or e-mail paperwork to you.   HMAP Contact: [phone]      Urgent Care Clinic  Monday, Tuesday, and Thursday from 8:30 - 12 noon  Please call ahead to speak with the nursing staff if you think you need to be seen urgently!    Lab info:  Your most recent CD4 T-cell counts and viral loads are below. Here are a few things to keep in mind when looking at your numbers:  ?? For most people, we're checking CD4 counts fairly infrequently (once a year or less)  ?? It's normal for your CD4 count to be different from visit to visit.   ?? We consider your viral load to be undetectable if it says <40 or if it says Not detected.  ?? Our goal is to get your virus to be undetectable and keep it undetectable. You can help by taking your medications at about the same time, every single day. If you're having trouble with taking your medications, it's important to let us know.    YOUR RECENT LAB RESULTS:  CD4 and VIRAL LOAD  Lab Results   Component Value Date    ACD4 1,118 10/22/2018    HIVRS Not Detected 09/26/2018

## 2018-10-31 NOTE — Unmapped (Signed)
Duration of Intervention: 15 minutes    SW met with pt after medical appt to check in. Pt doing well. SW provided psychosocial support re: pt desire for baby shower but stresses about family involvement. Pt reports perfect adherence on ARVs.     Bradly Bienenstock LCSWA, CHES

## 2018-11-01 ENCOUNTER — Emergency Department
Admission: EM | Admit: 2018-11-01 | Discharge: 2018-11-01 | Disposition: A | Discharge status: Home / Self Care | Payer: 59 | Attending: Emergency Medicine | Admitting: Emergency Medicine

## 2018-11-01 ENCOUNTER — Other Ambulatory Visit: Payer: Self-pay

## 2018-11-01 ENCOUNTER — Telehealth: Payer: Self-pay | Admitting: Obstetrics and Gynecology

## 2018-11-01 DIAGNOSIS — Z87891 Personal history of nicotine dependence: Secondary | ICD-10-CM | POA: Insufficient documentation

## 2018-11-01 DIAGNOSIS — Z3A15 15 weeks gestation of pregnancy: Secondary | ICD-10-CM | POA: Insufficient documentation

## 2018-11-01 DIAGNOSIS — Z21 Asymptomatic human immunodeficiency virus [HIV] infection status: Secondary | ICD-10-CM | POA: Insufficient documentation

## 2018-11-01 DIAGNOSIS — B349 Viral infection, unspecified: Secondary | ICD-10-CM | POA: Diagnosis not present

## 2018-11-01 DIAGNOSIS — O9989 Other specified diseases and conditions complicating pregnancy, childbirth and the puerperium: Secondary | ICD-10-CM | POA: Diagnosis present

## 2018-11-01 LAB — INFLUENZA PANEL BY PCR (TYPE A & B)
INFLAPCR: NEGATIVE
Influenza B By PCR: NEGATIVE

## 2018-11-01 LAB — GROUP A STREP BY PCR: GROUP A STREP BY PCR: NOT DETECTED

## 2018-11-01 NOTE — ED Provider Notes (Signed)
[facility] Emergency Department Provider Note   ____________________________________________   First MD Initiated Contact with Patient 11/01/18 [phone]     (approximate)  I have reviewed the triage vital signs and the nursing notes.   HISTORY  Chief Complaint Influenza   HPI Amber Key is a 33 y.o. female presents to the ED with complaint of flulike symptoms along with generalized body aches, nasal congestion, sore throat and cough for 2 days.  Patient reports that she is [redacted] weeks pregnant.  She denies any diarrhea but states that she is he has had vomiting which she thought was related to her pregnancy.  Currently she rates her pain as an 8 out of 10.   Past Medical History:  Diagnosis Date  . Enlarged lymph nodes unk  . HIV (human immunodeficiency virus infection) ([place])    diagnosed years ago at [facility]  . Lymphoma ([place])   . Memory loss   . Migraines   . Pseudotumor cerebri unk  . Vision loss     Patient Active Problem List   Diagnosis Date Noted  . Pregnancy of unknown anatomic location 05/21/2018  . Abnormal mammogram 06/26/2014  . Adenopathy 02/11/2014  . Human immunodeficiency virus (HIV) infection ([place]) 02/10/2014  . Headache, migraine 12/13/2013  . Benign intracranial hypertension 12/13/2013    Past Surgical History:  Procedure Laterality Date  . COLONOSCOPY    . LUMBAR PUNCTURE    . NO PAST SURGERIES    . UPPER GI ENDOSCOPY    . wisdom teeth      Prior to Admission medications   Medication Sig Start Date End Date Taking? Authorizing Provider  acetaZOLAMIDE (DIAMOX) 500 MG capsule Take 500 mg by mouth 2 (two) times daily.    [provider]  bictegravir-emtricitabine-tenofovir AF (BIKTARVY) 50-200-25 MG TABS tablet Take by mouth daily.    [provider]  diphenhydrAMINE (BENADRYL) 25 mg capsule Take 25 mg by mouth every 6 (six) hours as needed.    [provider]  promethazine (PHENERGAN) 25 MG tablet  Take 25 mg by mouth every 6 (six) hours as needed for nausea or vomiting.    [provider]  SUMAtriptan (IMITREX) 50 MG tablet Take 50 mg by mouth every 2 (two) hours as needed for migraine. May repeat in 2 hours if headache persists or recurs.    [provider]    Allergies Peanut-containing drug products  Family History  Problem Relation Age of Onset  . Ovarian cancer Mother   . Breast cancer Mother   . Ovarian cancer Maternal Aunt   . Diabetes Maternal Aunt   . Hypertension Maternal Aunt   . Ovarian cancer Maternal Grandmother   . Breast cancer Maternal Grandmother   . Colon cancer Neg Hx     Social History Social History   Tobacco Use  . Smoking status: Former Smoker    Types: E-cigarettes, Cigarettes  . Smokeless tobacco: Never Used  Substance Use Topics  . Alcohol use: Not Currently    Comment: occas  . Drug use: Not Currently    Frequency: 7.0 times per week    Types: Marijuana    Comment: for migraines    Review of Systems Constitutional: Subjective fever/chills Eyes: No visual changes. ENT: Positive sore throat.  Positive nasal congestion. Cardiovascular: Denies chest pain. Respiratory: Denies shortness of breath.  Positive cough. Gastrointestinal: No abdominal pain.  No nausea, positive vomiting.  No diarrhea.  No constipation. Genitourinary: Negative for dysuria. Musculoskeletal: Positive  body aches. Skin: Negative for rash. Neurological: Negative for headaches, focal weakness or numbness. ___________________________________________   PHYSICAL EXAM:  VITAL SIGNS: ED Triage Vitals  Enc Vitals Group     BP 11/01/18 0741 111/68     Pulse Rate 11/01/18 0741 (!) 107     Resp 11/01/18 0741 16     Temp 11/01/18 0741 98.1 F (36.7 C)     Temp Source 11/01/18 0741 Oral     SpO2 11/01/18 0741 100 %     Weight 11/01/18 0742 177 lb (80.3 kg)     Height 11/01/18 0742 4\' 11"  (1.499 m)     Head Circumference --      Peak Flow --       Pain Score 11/01/18 0742 8     Pain Loc --      Pain Edu? --      Excl. in [place]? --    Constitutional: Alert and oriented. Well appearing and in no acute distress. Eyes: Conjunctivae are normal.  Head: Atraumatic. Nose: Minimal congestion/rhinnorhea.  TMs are dull bilaterally. Mouth/Throat: Mucous membranes are moist.  Oropharynx non-erythematous.  Minimal posterior drainage. Neck: No stridor.   Hematological/Lymphatic/Immunilogical: No cervical lymphadenopathy. Cardiovascular: Normal rate, regular rhythm. Grossly normal heart sounds.  Good peripheral circulation. Respiratory: Normal respiratory effort.  No retractions. Lungs CTAB. Gastrointestinal: Soft and nontender. No distention.   Musculoskeletal: Moves upper and lower extremities without any difficulty.  Normal gait was noted. Neurologic:  Normal speech and language. No gross focal neurologic deficits are appreciated.  Skin:  Skin is warm, dry and intact. No rash noted. Psychiatric: Mood and affect are normal. Speech and behavior are normal.  ____________________________________________   LABS (all labs ordered are listed, but only abnormal results are displayed)  Labs Reviewed  GROUP A STREP BY PCR  INFLUENZA PANEL BY PCR (TYPE A & B)     PROCEDURES  Procedure(s) performed: None  Procedures  Critical Care performed: No  ____________________________________________   INITIAL IMPRESSION / ASSESSMENT AND PLAN / ED COURSE  As part of my medical decision making, I reviewed the following data within the electronic MEDICAL RECORD NUMBER Notes from prior ED visits and [place] Controlled Substance Database  Patient presents to the ED with complaint of viral-like symptoms with body aches, decreased energy, nasal congestion, cough and sore throat.  Patient has been vomiting but attributed that to her 15-week pregnancy.  Physical exam was reassuring and patient was made aware that her strep and influenza test both were negative.  Patient  is to increase fluids and take Tylenol as needed for fever or body aches.  She is to contact her OB/GYN if any continued medication is needed.  ____________________________________________   FINAL CLINICAL IMPRESSION(S) / ED DIAGNOSES  Final diagnoses:  Viral syndrome     ED Discharge Orders    None       Note:  This document was prepared using Dragon voice recognition software and may include unintentional dictation errors.    Johnn Hai, PA-C 11/01/18 [id number]    Nena Polio, MD 11/01/18 [phone]

## 2018-11-01 NOTE — Telephone Encounter (Signed)
Please advise 

## 2018-11-01 NOTE — Telephone Encounter (Signed)
Per Dr. Amalia Hailey notified the patient to stay well hydrated and get plenty of rest. Patient verbalized understanding.

## 2018-11-01 NOTE — ED Triage Notes (Signed)
Pt c/o "flu" Pt c/o generalized body aches with decreased energy, nasal congestion, cough, sore throat Pt [redacted] weeks pregnant

## 2018-11-01 NOTE — Discharge Instructions (Signed)
Follow-up with your OB/GYN if needed.  Continue to drink fluids.  You may take Tylenol if needed for fever or body aches.  Call your OB/GYN if you are not improving.

## 2018-11-01 NOTE — Telephone Encounter (Signed)
The patient called extremely upset stating that she just left the ED and was in formed that she has "A virus" The patient was advised to reach out to her OBGYN office for treatment. The patient is concerned and frightened and is hoping to hear from a nurse or provider sometime today is possible. I spoke with CM and informed the patient that a provider will review her notes and contact her as soon as they can. Please advise.   *Pt was discharged from [facility]. Pt stated she is going to get food and go home until someone calls her*

## 2018-11-01 NOTE — ED Notes (Signed)
See triage note   States she thinks she may have the "flu"   States she is congested ,having some discomfort in chest with inspiration and also feeling weak  Slight sore throat  Denies any fever and is afebrile on arrival   States she is also [redacted] weeks pregnant

## 2018-11-05 ENCOUNTER — Other Ambulatory Visit: Payer: Self-pay

## 2018-11-05 DIAGNOSIS — O09892 Supervision of other high risk pregnancies, second trimester: Secondary | ICD-10-CM

## 2018-11-05 NOTE — Unmapped (Signed)
PROMIS Tablet Screening  Completed Date: 10/31/2018     SW reviewed self-administered screening.    Patient had a PHQ-9 score of 1  indicating None-Minimal depression.   Pt denies SI.   Pt scored 1 on AUDIT/AUDIT-C indicating Not-at-risk alcohol consumption  Pt  denies substance use in past 3 months.   Pt denies concerns for IPV.    Pt seen for regular ID visit today.        Basil Dess, MSW, LCSW, LCAS-A  Clinical Social Worker and Addictions Specialist  In[facility] Clinic

## 2018-11-09 NOTE — Unmapped (Signed)
Duration of Intervention: 15 minutes    Pt called stating rent assistance needs for February. Pt was sick frequently this week due to possible infection and multiple medical appointments so paycheck was smaller than usual. Pt called DSS but they said she needed eviction notice to qualify. Advised pt to call [employer] as well. Informed pt we would send referral to [facility] [employer] [facility] as they offer rent assistance at times. Gave pt number for Laurette Schimke to f/u. SW faxed this over today. Pt expressed no other immediate concerns for SW intervention at this time.      Bradly Bienenstock LCSWA, CHES

## 2018-11-12 ENCOUNTER — Ambulatory Visit: Payer: 59

## 2018-11-12 ENCOUNTER — Inpatient Hospital Stay: Admission: RE | Admit: 2018-11-12 | Payer: 59 | Source: Ambulatory Visit

## 2018-11-12 ENCOUNTER — Institutional Professional Consult (permissible substitution): Payer: 59

## 2018-11-12 NOTE — Unmapped (Signed)
Duration of Intervention: 15 minutes    Laurette Schimke called SW to discuss pt financial issues with rent. Laurette Schimke can pay $300 but total rent is $800. SW offered [employer]. Laurette Schimke will acquire [address] and lease from pt and send over to SW.     Bradly Bienenstock LCSWA, CHES

## 2018-11-13 NOTE — Unmapped (Signed)
Duration of Intervention: 15 minutes    SW submitted [facility] application for pt rent assistance and sent fax with pledge letter to [employer]. Informed Laurette Schimke at [facility] who will pay for remainder.     Bradly Bienenstock LCSWA, CHES

## 2018-11-14 MED ORDER — PROMETHAZINE 25 MG TABLET
ORAL_TABLET | Freq: Three times a day (TID) | ORAL | 2 refills | 0 days | Status: CP | PRN
Start: 2018-11-14 — End: 2019-02-15

## 2018-11-14 NOTE — Unmapped (Signed)
Faxed refill request received from pharmacy: [facility]    Last Visit Date: 08/22/2018     Next Visit Date: 01/23/2019

## 2018-11-19 ENCOUNTER — Institutional Professional Consult (permissible substitution): Payer: 59

## 2018-11-19 ENCOUNTER — Ambulatory Visit
Admission: RE | Admit: 2018-11-19 | Discharge: 2018-11-19 | Disposition: A | Discharge status: Home / Self Care | Payer: 59 | Source: Ambulatory Visit | Attending: Obstetrics and Gynecology | Admitting: Obstetrics and Gynecology

## 2018-11-19 ENCOUNTER — Other Ambulatory Visit: Payer: 59

## 2018-11-19 ENCOUNTER — Encounter
Admit: 2018-11-19 | Discharge: 2018-11-20 | Payer: BLUE CROSS/BLUE SHIELD | Attending: Obstetrics & Gynecology | Primary: Obstetrics & Gynecology

## 2018-11-19 DIAGNOSIS — O10912 Unspecified pre-existing hypertension complicating pregnancy, second trimester: Secondary | ICD-10-CM | POA: Insufficient documentation

## 2018-11-19 DIAGNOSIS — Z21 Asymptomatic human immunodeficiency virus [HIV] infection status: Secondary | ICD-10-CM | POA: Diagnosis not present

## 2018-11-19 DIAGNOSIS — Z3A17 17 weeks gestation of pregnancy: Secondary | ICD-10-CM | POA: Insufficient documentation

## 2018-11-19 DIAGNOSIS — Z79899 Other long term (current) drug therapy: Secondary | ICD-10-CM | POA: Diagnosis not present

## 2018-11-19 DIAGNOSIS — Z833 Family history of diabetes mellitus: Secondary | ICD-10-CM | POA: Diagnosis not present

## 2018-11-19 DIAGNOSIS — Z91010 Allergy to peanuts: Secondary | ICD-10-CM | POA: Insufficient documentation

## 2018-11-19 DIAGNOSIS — G932 Benign intracranial hypertension: Secondary | ICD-10-CM | POA: Diagnosis not present

## 2018-11-19 DIAGNOSIS — G43001 Migraine without aura, not intractable, with status migrainosus: Secondary | ICD-10-CM

## 2018-11-19 DIAGNOSIS — G43909 Migraine, unspecified, not intractable, without status migrainosus: Secondary | ICD-10-CM | POA: Insufficient documentation

## 2018-11-19 DIAGNOSIS — O98712 Human immunodeficiency virus [HIV] disease complicating pregnancy, second trimester: Secondary | ICD-10-CM | POA: Insufficient documentation

## 2018-11-19 DIAGNOSIS — O09892 Supervision of other high risk pregnancies, second trimester: Secondary | ICD-10-CM

## 2018-11-19 DIAGNOSIS — Z8041 Family history of malignant neoplasm of ovary: Secondary | ICD-10-CM | POA: Insufficient documentation

## 2018-11-19 DIAGNOSIS — Z803 Family history of malignant neoplasm of breast: Secondary | ICD-10-CM | POA: Insufficient documentation

## 2018-11-19 DIAGNOSIS — Z87891 Personal history of nicotine dependence: Secondary | ICD-10-CM | POA: Diagnosis not present

## 2018-11-19 DIAGNOSIS — O99352 Diseases of the nervous system complicating pregnancy, second trimester: Secondary | ICD-10-CM | POA: Diagnosis not present

## 2018-11-19 DIAGNOSIS — B20 Human immunodeficiency virus [HIV] disease: Secondary | ICD-10-CM

## 2018-11-19 DIAGNOSIS — O0990 Supervision of high risk pregnancy, unspecified, unspecified trimester: Principal | ICD-10-CM

## 2018-11-19 NOTE — Progress Notes (Signed)
[facility] Consultation   Chief Complaint: HIV infection in pregnancy   HPI: [name] is a 33 y.o. [id number] at [redacted]w[redacted]d by LMP and earliest u/s on 09/11/18 at [facility] giving a EDC of 04/25/2019 who presents in consultation from  [facility] Dr Amalia Hailey for h/o HIV infection .  Pt is followed at [facility] for her HIV infection by Dr Jacolyn Reedy  . She reports an undetectable viral load and adherence to her Biktarvy. Her partner is HIV negative by his report  I saw her last July 2019  with an early pregnancy loss . No cause determined for recurrent early losses   Pt was recently seen at [facility] by Dr Dallampati in their [place] clinic pt is planning on being delivered there . Past Medical History: Patient  has a past medical history of Enlarged lymph nodes (unk), HIV (human immunodeficiency virus infection) ([place]), Lymphoma ([place]), Memory loss, Migraines, Pseudotumor cerebri (unk), and Vision loss.  Past Surgical History: She  has a past surgical history that includes Colonoscopy; Upper gi endoscopy; wisdom teeth; Lumbar puncture; and No past surgeries.  Obstetric History:  OB History    Gravida  10   Para  1   Term  1   Preterm      AB  8   Living  1     SAB  0   TAB      Ectopic  0   Multiple      Live Births  1         SVD 12 years ago 5 lbs 15 oz no complications  Reports 8 early losses  Gynecologic History:  Patient's last menstrual period was 07/14/2018 (exact date).    Medications: Current Outpatient Medications:  .  bictegravir-emtricitabine-tenofovir AF (BIKTARVY) 50-200-25 MG TABS tablet, Take by mouth daily., Disp: , Rfl:  .  diphenhydrAMINE (BENADRYL) 25 mg capsule, Take 25 mg by mouth every 6 (six) hours as needed., Disp: , Rfl:  .  Prenatal Vit-Fe Fumarate-FA (PRENATAL MULTIVITAMIN) TABS tablet, Take 1 tablet by mouth daily at 12 noon., Disp: , Rfl:  .  promethazine (PHENERGAN) 25 MG tablet, Take 25 mg by mouth every 6 (six) hours as needed for  nausea or vomiting., Disp: , Rfl:  .  SUMAtriptan (IMITREX) 50 MG tablet, Take 50 mg by mouth every 2 (two) hours as needed for migraine. May repeat in 2 hours if headache persists or recurs., Disp: , Rfl:   Allergies: Patient is allergic to peanut-containing drug products.  Social History: Patient  reports that she has quit smoking. Her smoking use included e-cigarettes and cigarettes. She has never used smokeless tobacco. She reports previous alcohol use. She reports previous drug use. Frequency: 7.00 times per week. Drug: Marijuana.  Family History: family history includes Breast cancer in her maternal grandmother and mother; Diabetes in her maternal aunt; Hypertension in her maternal aunt; Ovarian cancer in her maternal aunt, maternal grandmother, and mother.  Review of Systems A full 12 point review of systems was negative or as noted in the History of Present Illness.  Physical Exam: LMP 07/14/2018 (Exact Date)  Short stature , talkative    Asessement: 1. Asymptomatic HIV infection ([place]) Chronic  2. 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 I told her that with elevated viral loads we offer a cesarean . ( pt was considering requesting ) I told her that national guidelines recommend continuing her meds through labor . At [facility] we recommend ZDV infusion in labor  in all our positive patients but this is not a CDC recommendation. We discussed use of ZDV syrup for the infant postnatally - she says she had a cousin who did this for her baby.   2 migraines - controleld rare use of Immitrex  3 benign intracranial hypertension - she saw her neurologist at [facility] in spetember and had a normal eye exam . I told her she could restart her  diamox if needed for HA or increasing papilledema  4 recurrent SAB - I did not see a w/u, fetus is normal appearing - we did not discuss genetic dx today  Consider APAS labs , offer tetra screen .     Total time spent with the patient was 30 minutes with greater than 50% spent in counseling and coordination of care. We appreciate this interesting consult and will be happy to be involved in the ongoing care of Amber Key in anyway her obstetricians desire. Artavis Cowie, [facility]

## 2018-11-19 NOTE — Unmapped (Signed)
[facility]                                                                         Return Prenatal Note    Assessment/Plan:   Plan   33 y.o. [id number] at [redacted]w[redacted]d presents for follow-up OB visit for high risk pregnancy. Reviewed prenatal record. Doing well.    Problem List Items Addressed This Visit        Nervous and Auditory    Pseudotumor cerebri     No changes in visual fields.   - Neuro appt 4/15            Other    HIV (Chronic)     Doing well with ARV regimen - no missed doses. Last saw ID 1/22.  - Unable to review labs from last visit as patient left   - Will obtain VL at next visit  - ID appt 4/22         Supervision of high risk pregnancy, unspecified, unspecified trimester - Primary     No OB complaints today.   - Anatomy scan at next visit  - No other evaluations today         Relevant Orders    US OB Transabd 2nd/3rd Tri Single Gestation [id number])            Return in about 4 weeks (around 12/17/2018).             I spent 15 minutes in face-to-face communication with Ms. Hoyos, greater than 50% of which was in counseling and education as above.       Dr. Stefanie Libel was immediately available for the care of this patient  Lemoyne Nestor, MD MPH  November 19, 2018 12:51 PM    Subjective:        33 y.o. [id number] at [redacted]w[redacted]d presents for follow-up OB visit for high risk pregnancy. Patient reports normal fetal movement and denies contractions, leakage of fluid, or vaginal bleeding. The patient is upset today because she found out that she is not getting an anatomy scan - she feels like she does not need this prenatal appointment and that it is too far for her to travel is there are no interventions being done. Would prefer appointments to be scheduled around the times of other interventions (testing, ultrasounds, etc).     No issues with ARV medications - has not missed doses. No issues with nausea/vomiting, abdominal pain, etc. Objective:        The following portions of the patient's history were reviewed and updated as appropriate: allergies, current medications, past obstetric history, past family history, past medical history, past social history, past surgical history and problem list    Flowsheet Vitals:  Temp: 36.8 ??C  Heart Rate: 115  BP: 109/74  Movement: Present  Loss of Fluid: No  Bleeding: No  Contractions: Not present  Total weight gain: 1.678 kg (3 lb 11.2 oz)    General Appearance No acute distress, well appearing, and well nourished   Pulmonary Normal work of breathing   Cardiovascular Normal rate  Edema none      Gastronintestinal Abdomen soft, gravid, no rebound or guarding.   Genitourinary Deferred SVE:  , No fundal  tenderness   Muskuloskeletal Normal gait, grossly normal ROM   Neurologic Alert and oriented to person, place, and time   Psychiatric Mood and affect within normal limits    FH deferred  FHR: 140

## 2018-11-19 NOTE — Unmapped (Signed)
No changes in visual fields.   - Neuro appt 4/15

## 2018-11-19 NOTE — Unmapped (Signed)
No OB complaints today.   - Anatomy scan at next visit  - No other evaluations today

## 2018-11-19 NOTE — Unmapped (Signed)
Doing well with ARV regimen - no missed doses. Last saw ID 1/22.  - Unable to review labs from last visit as patient left   - Will obtain VL at next visit  - ID appt 4/22

## 2018-11-21 ENCOUNTER — Telehealth: Payer: Self-pay | Admitting: Obstetrics and Gynecology

## 2018-11-21 NOTE — Telephone Encounter (Signed)
I have checked the box and there is no papers in there.

## 2018-11-21 NOTE — Telephone Encounter (Signed)
The patient called and stated that she is going to need dental clearance to get a root canal done today. I gave the patient our fax number to send to the office to have provider approve. The patient voiced understanding. Please advise.

## 2018-11-23 NOTE — Unmapped (Signed)
[facility] Refill Coordination Note  Specialty Medication(s): ESMERELDA FINNIGAN, DOB: [birth year]  Phone: [phone] (home) , Alternate phone contact: N/A  Phone or address changes today?: No  All above HIPAA information was verified with patient.  Shipping Address: [address] [place] [address]  [place] [address]   Insurance changes? No    Completed refill call assessment today to schedule patient's medication shipment from the [facility] [phone]).      Confirmed the medication and dosage are correct and have not changed: Yes, regimen is correct and unchanged.    Confirmed patient started or stopped the following medications in the past month:  No, there are no changes reported at this time.    Are you tolerating your medication?:  Miroslava reports tolerating the medication.    ADHERENCE        Did you miss any doses in the past 4 weeks? No missed doses reported. PATIENT ADDED THAT SHE IS UNDETECTABLE. SHE IS ALSO PREGNANT, AND WANTS HER BABY TO BE HEALTHY, SO SHE IS DEFINITELY NOT MISSING ANY DOSES    FINANCIAL/SHIPPING    Delivery Scheduled: Yes, Expected medication delivery date: 2/20     Medication will be delivered via Next Day Courier to the home address in [place].    The patient will receive a drug information handout for each medication shipped and additional FDA Medication Guides as required.      Terrisa did not have any additional questions at this time.    We will follow up with patient monthly for standard refill processing and delivery.      Thank you,  [name]   [facility] Specialty Technician

## 2018-11-26 NOTE — Unmapped (Signed)
na

## 2018-11-28 MED FILL — BIKTARVY 50 MG-200 MG-25 MG TABLET: 30 days supply | Qty: 30 | Fill #0 | Status: AC

## 2018-11-29 ENCOUNTER — Ambulatory Visit: Payer: 59

## 2018-11-30 ENCOUNTER — Telehealth: Payer: Self-pay | Admitting: Obstetrics and Gynecology

## 2018-11-30 NOTE — Telephone Encounter (Signed)
The patient called and stated that she slipped and fell in the snow yesterday and would like to speak with a nurse to confirm that everything is okay. Please advise.

## 2018-11-30 NOTE — Telephone Encounter (Signed)
Spoke with patient and she fell yesterday. Patient fell on right side. She still feels the baby move. She was sore when she got out of bed this AM. Do you want her to be seen?

## 2018-11-30 NOTE — Telephone Encounter (Signed)
Advised patient that if she is having pain or bleeding then she would need to go to the ED. Patient states that she is still having fetal movement. I advised her to let who she is seeing about the fall as well.

## 2018-11-30 NOTE — Telephone Encounter (Signed)
I'm very confused.  She has never seen [place] for her pregnancy.

## 2018-11-30 NOTE — Telephone Encounter (Signed)
Well, she should follow up with who has been providing her prenatal care.  If she is having pain or bleeding, or not feeling fetal movement, she should be seen at the closest Emergency Room/Labor and Delivery (if it's [facility], then we can assess her there).

## 2018-11-30 NOTE — Telephone Encounter (Signed)
She seen Dr. Amalia Hailey on 10/12/2018. [place] ordered on 09/11/2018 validating. I think she has been seeing [facility] and [place]. Would you like me to have her call [facility]? Sorry for the confusion.

## 2018-12-10 ENCOUNTER — Encounter
Admit: 2018-12-10 | Discharge: 2018-12-11 | Payer: BLUE CROSS/BLUE SHIELD | Attending: Obstetrics & Gynecology | Primary: Obstetrics & Gynecology

## 2018-12-10 DIAGNOSIS — W101XXA Fall (on)(from) sidewalk curb, initial encounter: Principal | ICD-10-CM

## 2018-12-10 DIAGNOSIS — G932 Benign intracranial hypertension: Principal | ICD-10-CM

## 2018-12-10 DIAGNOSIS — O0990 Supervision of high risk pregnancy, unspecified, unspecified trimester: Principal | ICD-10-CM

## 2018-12-10 DIAGNOSIS — J069 Acute upper respiratory infection, unspecified: Principal | ICD-10-CM

## 2018-12-10 DIAGNOSIS — Z21 Asymptomatic human immunodeficiency virus [HIV] infection status: Principal | ICD-10-CM

## 2018-12-10 MED ORDER — ACETAZOLAMIDE 250 MG TABLET
ORAL_TABLET | Freq: Three times a day (TID) | ORAL | 0 refills | 0 days | Status: CP
Start: 2018-12-10 — End: 2019-01-01

## 2018-12-10 NOTE — Unmapped (Signed)
Reports fall in the snow on 2/20.  Fell on her belly. No vaginal bleeding or abdominal pain.  Reports good fetal activity.  C/O intermittent right sided back pain made worse with certain movments, better with a hug.  Called her local doctor but was not seen.  Called [place] today and was see as unscheduled visit because she needs clearance to return to work.      Appears well.  Normal gait.  Abdomen in nontender.  No tenderness elicits on exam of back.    Recommended stretches for back muscles.  Tylenol.

## 2018-12-10 NOTE — Unmapped (Addendum)
No changes in visual fields.   Requesting to restart diamox given intermittent headache.  - Neuro appt 4/15

## 2018-12-10 NOTE — Unmapped (Signed)
-   Anatomy scan 3/9

## 2018-12-10 NOTE — Unmapped (Signed)
[facility]                                                                      Unscheduled visit for eval of Backpain    Assessment/Plan:   Plan   33 y.o. [id number] at [redacted]w[redacted]d presents for a unscheduled visit after a fall for clearance to return to work.    Pseudotumor cerebri  No changes in visual fields.   Requesting to restart diamox given intermittent headache.  - Neuro appt 4/15    HIV  Doing well with ARV regimen - reports adherance. Last saw ID 1/22.   - Will obtain VL at next visit  - ID appt 4/22    Upper respiratory infection  Reports symptoms of URI beginning yesterday.  Scratchy throat.  Congestions started today.  Denies fever.  Denies maliase.  Discussed remedies -- Zinc, nyquil/dayquil okay.  Instructed to call for fever.  Flu precautions reviewed.    Fall (on)(from) sidewalk curb, initial encounter  Reports fall in the snow on 2/20.  Fell on her belly. No vaginal bleeding or abdominal pain.  Reports good fetal activity.  C/O intermittent right sided back pain made worse with certain movments, better with a hug.  Called her local doctor but was not seen.  Called [place] today and was see as unscheduled visit because she needs clearance to return to work.      Appears well.  Normal gait.  Abdomen in nontender.  No tenderness elicits on exam of back.    Recommended stretches for back muscles.  Tylenol.        No follow-ups on file.        Subjective:         33 y.o. [id number] at [redacted]w[redacted]d presents for unscheduled visit after a fall. Patient reports normal fetal movement and denies contractions, leakage of fluid, or vaginal bleeding.      Objective:        The following portions of the patient's history were reviewed and updated as appropriate: allergies, past obstetric history, past family history, past medical history, past social history, past surgical history and problem list    Flowsheet Vitals:  Temp: 36.5 ??C (97.7 ??F)  Heart Rate: 116  BP: 109/85  Movement: Present  Loss of Fluid: No  Bleeding: No  Contractions: Not present  Fetal Heart Rate: 150  Total weight gain: 2.268 kg (5 lb)    General Appearance No acute distress, well appearing, and well nourished.   Pulmonary Normal work of breathing.   Cardiovascular   Edema none.      Gastronintestinal Abdomen soft, gravid, no rebound or guarding.       Muskuloskeletal Normal gait, grossly normal range of motion  No tenderness of back   Neurologic Alert and oriented to person, place, and time   Psychiatric  Integumentary Mood and affect within normal limits  Skin is warm, dry, no rash noted   I spent 20 minutes in face-to-face communication with Ms. Templeman, greater than 50% of which was in counseling and education as above.     Okay to return to work.  Note providered

## 2018-12-10 NOTE — Unmapped (Signed)
Duration of Intervention: 5 Minutes    SW provided pt with 1 RWD parking passes to assist with the cost of parking today since pt is eligible for RW services.     Arlyn Dunning, MSW, LCSW

## 2018-12-10 NOTE — Unmapped (Signed)
Reports symptoms of URI beginning yesterday.  Scratchy throat.  Congestions started today.  Denies fever.  Denies maliase.  Discussed remedies -- Zinc, nyquil/dayquil okay.  Instructed to call for fever.  Flu precautions reviewed.

## 2018-12-10 NOTE — Unmapped (Signed)
Doing well with ARV regimen - reports adherance. Last saw ID 1/22.   - Will obtain VL at next visit  - ID appt 4/22

## 2018-12-14 ENCOUNTER — Other Ambulatory Visit: Payer: Self-pay

## 2018-12-14 ENCOUNTER — Inpatient Hospital Stay (HOSPITAL_COMMUNITY)
Admission: AD | Admit: 2018-12-14 | Discharge: 2018-12-14 | Disposition: A | Discharge status: Home / Self Care | Payer: 59 | Attending: Obstetrics and Gynecology | Admitting: Obstetrics and Gynecology

## 2018-12-14 ENCOUNTER — Encounter (HOSPITAL_COMMUNITY): Payer: Self-pay | Admitting: *Deleted

## 2018-12-14 DIAGNOSIS — R1030 Lower abdominal pain, unspecified: Secondary | ICD-10-CM | POA: Diagnosis not present

## 2018-12-14 DIAGNOSIS — Z833 Family history of diabetes mellitus: Secondary | ICD-10-CM | POA: Diagnosis not present

## 2018-12-14 DIAGNOSIS — B20 Human immunodeficiency virus [HIV] disease: Secondary | ICD-10-CM | POA: Insufficient documentation

## 2018-12-14 DIAGNOSIS — R109 Unspecified abdominal pain: Secondary | ICD-10-CM | POA: Diagnosis not present

## 2018-12-14 DIAGNOSIS — O26892 Other specified pregnancy related conditions, second trimester: Secondary | ICD-10-CM | POA: Diagnosis not present

## 2018-12-14 DIAGNOSIS — O98712 Human immunodeficiency virus [HIV] disease complicating pregnancy, second trimester: Secondary | ICD-10-CM | POA: Diagnosis not present

## 2018-12-14 DIAGNOSIS — O9989 Other specified diseases and conditions complicating pregnancy, childbirth and the puerperium: Secondary | ICD-10-CM | POA: Insufficient documentation

## 2018-12-14 DIAGNOSIS — Z91010 Allergy to peanuts: Secondary | ICD-10-CM | POA: Insufficient documentation

## 2018-12-14 DIAGNOSIS — Z8041 Family history of malignant neoplasm of ovary: Secondary | ICD-10-CM | POA: Insufficient documentation

## 2018-12-14 DIAGNOSIS — Z3A21 21 weeks gestation of pregnancy: Secondary | ICD-10-CM

## 2018-12-14 DIAGNOSIS — Z87891 Personal history of nicotine dependence: Secondary | ICD-10-CM | POA: Insufficient documentation

## 2018-12-14 DIAGNOSIS — Z803 Family history of malignant neoplasm of breast: Secondary | ICD-10-CM | POA: Diagnosis not present

## 2018-12-14 HISTORY — DX: Chlamydial infection, unspecified: A74.9

## 2018-12-14 LAB — WET PREP, GENITAL
CLUE CELLS WET PREP: NONE SEEN
Sperm: NONE SEEN
Trich, Wet Prep: NONE SEEN
Yeast Wet Prep HPF POC: NONE SEEN

## 2018-12-14 MED ORDER — ACETAMINOPHEN 500 MG PO TABS: 1000.0000 mg | ORAL_TABLET | Freq: Once | ORAL | Status: DC

## 2018-12-14 NOTE — MAU Note (Signed)
Pain started around 1000, in her pelvic bone, feels like something is going to come out. No bleeding or leaking.

## 2018-12-14 NOTE — Discharge Instructions (Signed)
Activity Restriction During Pregnancy Your health care provider may recommend specific activity restrictions during pregnancy for a variety of reasons. Activity restriction may require that you limit activities that require great effort, such as exercise, lifting, or sex. The type of activity restriction will vary for each person, depending on your risk or the problems you are having. Activity restriction may be recommended for a period of time until your baby is delivered. Why are activity restrictions recommended? Activity restriction may be recommended if:  Your placenta is partially or completely covering the opening of your cervix (placenta previa).  There is bleeding between the wall of the uterus and the amniotic sac in the first trimester of pregnancy (subchorionic hemorrhage).  You went into labor too early (preterm labor).  You have a history of miscarriage.  You have a condition that causes high blood pressure during pregnancy (preeclampsia or eclampsia).  You are pregnant with more than one baby.  Your baby is not growing well. What are the risks? The risks depend on your specific restriction. Strict bed rest has the most physical and emotional risks and is no longer routinely recommended. Risks of strict bed rest include:  Loss of muscle conditioning from not moving.  Blood clots.  Social isolation.  Depression.  Loss of income. Talk with your health care team about activity restriction to decide if it is best for you and your baby. Even if you are having problems during your pregnancy, you may be able to continue with normal levels of activity with careful monitoring by your health care team. Follow these instructions at home: If needed, based on your overall health and the health of your baby, your health care provider will decide which type of activity restriction is right for you. Activity restrictions may include:  Not lifting anything heavier than 10 pounds (4.5  kg).  Avoiding activities that take a lot of physical effort.  No lifting or straining.  Resting in a sitting position or lying down for periods of time during the day. Pelvic rest may be recommended along with activity restrictions. If pelvic rest is recommended, then:  Do not have sex, an orgasm, or use sexual stimulation.  Do not use tampons. Do not douche. Do not put anything into your vagina.  Do not lift anything that is heavier than 10 lb (4.5 kg).  Avoid activities that require a lot of effort.  Avoid any activity in which your pelvic muscles could become strained, such as squatting. Questions to ask your health care provider  Why is my activity being limited?  How will activity restrictions affect my body?  Why is rest helpful for me and my baby?  What activities can I do?  When can I return to normal activities? When should I seek immediate medical care? Seek immediate medical care if you have:  Vaginal bleeding.  Vaginal discharge.  Cramping pain in your lower abdomen.  Regular contractions.  A low, dull backache. Summary  Your health care provider may recommend specific activity restrictions during pregnancy for a variety of reasons.  Activity restriction may require that you limit activities such as exercise, lifting, sex, or any other activity that requires great effort.  Discuss the risks and benefits of activity restriction with your health care team to decide if it is best for you and your baby.  Contact your health care provider right away if you think you are having contractions, or if you notice vaginal bleeding, discharge, or cramping. This information is not  intended to replace advice given to you by your health care provider. Make sure you discuss any questions you have with your health care provider. °Document Released: 01/21/2011 Document Revised: 01/16/2018 Document Reviewed: 01/16/2018 °Elsevier Interactive Patient Education © 2019 [employer] ° °

## 2018-12-14 NOTE — MAU Provider Note (Signed)
History     CSN: [id number]  Arrival date and time: 12/14/18 1211   First Provider Initiated Contact with Patient 12/14/18 1244      Chief Complaint  Patient presents with  . Abdominal Pain   Amber Key is a 33 y.o. [id number] at [redacted]w[redacted]d who presents for Abdominal Pain.  She states she was at work and started experiencing constant lower abdominal pain.  Patient states she attempted to walk and do some stretches without relief.   She states that the pain was increasing in intensity and 911 was called.  She endorses fetal movement and denies LOF, VB, and change in vaginal discharge.  She reports that she receives care at [facility] and has an appt on Monday.  Patient states she has had 9 losses, but does not have a cerclage in place nor does she receive 17-p injections.      OB History    Gravida  11   Para  1   Term  1   Preterm      AB  9   Living  1     SAB  9   TAB      Ectopic  0   Multiple      Live Births  1           Past Medical History:  Diagnosis Date  . Chlamydia   . Enlarged lymph nodes unk  . HIV (human immunodeficiency virus infection) ([place])    diagnosed years ago at [facility]  . Lymphoma ([place])   . Memory loss   . Migraines   . Pseudotumor cerebri unk  . Vision loss     Past Surgical History:  Procedure Laterality Date  . COLONOSCOPY    . DILATION AND CURETTAGE OF UTERUS    . LUMBAR PUNCTURE    . NO PAST SURGERIES    . UPPER GI ENDOSCOPY    . wisdom teeth      Family History  Problem Relation Age of Onset  . Ovarian cancer Mother   . Breast cancer Mother   . Diabetes Mother   . Hypertension Mother   . Ovarian cancer Maternal Aunt   . Diabetes Maternal Aunt   . Hypertension Maternal Aunt   . Ovarian cancer Maternal Grandmother   . Breast cancer Maternal Grandmother   . Colon cancer Neg Hx     Social History   Tobacco Use  . Smoking status: Former Smoker    Types: Cigarettes  . Smokeless tobacco: Never Used  . Tobacco comment: quit  Sept 2019  Substance Use Topics  . Alcohol use: Not Currently    Comment: not with preg  . Drug use: Not Currently    Types: Marijuana    Comment: not with preg    Allergies:  Allergies  Allergen Reactions  . Peanut-Containing Drug Products     Medications Prior to Admission  Medication Sig Dispense Refill Last Dose  . acetaminophen (TYLENOL) 500 MG tablet Take 1,000 mg by mouth every 6 (six) hours as needed.   12/13/2018 at Unknown time  . bictegravir-emtricitabine-tenofovir AF (BIKTARVY) 50-200-25 MG TABS tablet Take by mouth daily.   12/13/2018 at Unknown time  . diphenhydrAMINE (BENADRYL) 25 mg capsule Take 25 mg by mouth every 6 (six) hours as needed.   12/13/2018 at Unknown time  . Prenatal Vit-Fe Fumarate-FA (PRENATAL MULTIVITAMIN) TABS tablet Take 1 tablet by mouth daily at 12 noon.   12/13/2018 at Unknown time  . promethazine (PHENERGAN) 25  MG tablet Take 25 mg by mouth every 6 (six) hours as needed for nausea or vomiting.   12/13/2018 at Unknown time  . SUMAtriptan (IMITREX) 50 MG tablet Take 50 mg by mouth every 2 (two) hours as needed for migraine. May repeat in 2 hours if headache persists or recurs.   Past Week at Unknown time    Review of Systems  Constitutional: Negative for chills and fever.  Gastrointestinal: Positive for abdominal pain. Negative for diarrhea, nausea and vomiting.  Genitourinary: Negative for vaginal bleeding and vaginal discharge.  Neurological: Negative for dizziness, light-headedness and headaches.   Physical Exam   Blood pressure 119/70, pulse 100, temperature 97.8 F (36.6 C), temperature source Oral, resp. rate (!) 22, height 4\' 11"  (1.499 m), last menstrual period 07/14/2018, unknown if currently breastfeeding.  Physical Exam  Constitutional: She is oriented to person, place, and time. She appears well-developed and well-nourished. No distress.  HENT:  Head: Normocephalic and atraumatic.  Eyes: Conjunctivae are normal.  Neck: Normal range of  motion.  Cardiovascular: Normal rate, regular rhythm and normal heart sounds.  Respiratory: Effort normal and breath sounds normal.  GI: Soft. Bowel sounds are normal. There is no abdominal tenderness.  Gravid--fundal height appears LGA, Soft, NT   Genitourinary:    Genitourinary Comments: Dilation: Closed Effacement (%): 50 Station: (no presenting part in pelvis) Exam by:: Milinda Cave CNM    Musculoskeletal: Normal range of motion.  Neurological: She is alert and oriented to person, place, and time.  Skin: Skin is warm and dry.  Psychiatric: Her behavior is normal. Her affect is labile.  Patient tearful and reporting intense 8/10 pain.     MAU Course  Procedures Results for orders placed or performed during the hospital encounter of 12/14/18 (from the past 24 hour(s))  Wet prep, genital     Status: Abnormal   Collection Time: 12/14/18 12:44 PM  Result Value Ref Range   Yeast Wet Prep HPF POC NONE SEEN NONE SEEN   Trich, Wet Prep NONE SEEN NONE SEEN   Clue Cells Wet Prep HPF POC NONE SEEN NONE SEEN   WBC, Wet Prep HPF POC MODERATE (A) NONE SEEN   Sperm NONE SEEN     MDM Physical Exam included BME Wet Prep Pain Management  Assessment and Plan  33 year old [id number] at 21.1 weeks Abdominal Pain  -Exam findings discussed -Wet prep collected via blind swab -IV fluids given while in ambulance, will infuse remainder of bag. -Patient agreeable to tylenol XR for pain. -Will await results and reassess   Follow Up (1:35 PM)   -Wet prep returns with insignificant findings -Results discussed with patient -Patient reports no change in abdominal pain, but is no longer crying and is now laughing with FOB. -Instructed to take tylenol as needed for pain. -Instructed to remain on bedrest, throughout the weekend, and to keep appt with MD on Monday for further evaluation. -No questions or concerns. -Encouraged to call primary ob or return to MAU if symptoms worsen or with the onset of  new symptoms. -Discharged to home in stable condition   Maryann Conners MSN, CNM 12/14/2018, 12:44 PM

## 2018-12-14 NOTE — Unmapped (Signed)
Pt called nurse advice stating that she has been having contractions every 7-71min. Since 10am and each contraction is getting closer together and stronger. Advised pt to go to L&D triage for evaluation. Pt verbalized understanding.

## 2018-12-17 ENCOUNTER — Encounter: Admit: 2018-12-17 | Discharge: 2018-12-17 | Payer: BLUE CROSS/BLUE SHIELD

## 2018-12-17 ENCOUNTER — Encounter
Admit: 2018-12-17 | Discharge: 2018-12-17 | Payer: BLUE CROSS/BLUE SHIELD | Attending: Obstetrics & Gynecology | Primary: Obstetrics & Gynecology

## 2018-12-17 DIAGNOSIS — G932 Benign intracranial hypertension: Principal | ICD-10-CM

## 2018-12-17 DIAGNOSIS — R197 Diarrhea, unspecified: Principal | ICD-10-CM

## 2018-12-17 DIAGNOSIS — R928 Other abnormal and inconclusive findings on diagnostic imaging of breast: Principal | ICD-10-CM

## 2018-12-17 DIAGNOSIS — A64 Unspecified sexually transmitted disease: Principal | ICD-10-CM

## 2018-12-17 DIAGNOSIS — E668 Other obesity: Principal | ICD-10-CM

## 2018-12-17 DIAGNOSIS — O0990 Supervision of high risk pregnancy, unspecified, unspecified trimester: Principal | ICD-10-CM

## 2018-12-17 DIAGNOSIS — O99352 Diseases of the nervous system complicating pregnancy, second trimester: Principal | ICD-10-CM

## 2018-12-17 DIAGNOSIS — O355XX0 Maternal care for (suspected) damage to fetus by drugs, not applicable or unspecified: Principal | ICD-10-CM

## 2018-12-17 DIAGNOSIS — Z9109 Other allergy status, other than to drugs and biological substances: Principal | ICD-10-CM

## 2018-12-17 DIAGNOSIS — O99212 Obesity complicating pregnancy, second trimester: Principal | ICD-10-CM

## 2018-12-17 DIAGNOSIS — K5900 Constipation, unspecified: Principal | ICD-10-CM

## 2018-12-17 DIAGNOSIS — O98712 Human immunodeficiency virus [HIV] disease complicating pregnancy, second trimester: Principal | ICD-10-CM

## 2018-12-17 DIAGNOSIS — B20 Human immunodeficiency virus [HIV] disease: Principal | ICD-10-CM

## 2018-12-17 DIAGNOSIS — Z3A21 21 weeks gestation of pregnancy: Principal | ICD-10-CM

## 2018-12-17 DIAGNOSIS — Z21 Asymptomatic human immunodeficiency virus [HIV] infection status: Principal | ICD-10-CM

## 2018-12-17 DIAGNOSIS — O09891 Supervision of other high risk pregnancies, first trimester: Principal | ICD-10-CM

## 2018-12-17 DIAGNOSIS — Z975 Presence of (intrauterine) contraceptive device: Principal | ICD-10-CM

## 2018-12-17 DIAGNOSIS — E669 Obesity, unspecified: Principal | ICD-10-CM

## 2018-12-17 DIAGNOSIS — G43909 Migraine, unspecified, not intractable, without status migrainosus: Principal | ICD-10-CM

## 2018-12-17 NOTE — Unmapped (Addendum)
-   Reports persistent HAs. After a detailed conversation during her last [facility] visit, patient opted to reinitiate Diamox. Patient had trouble picking up her prescription. I checked her orders, and an Rx was sent to her specified pharmacy. Patient to call pharmacy.   - Current regimen unchanged   - Followed by Neurology with [facility] 4/15

## 2018-12-17 NOTE — Unmapped (Addendum)
Maternal wellbeing:   - Labs: CD4 and VL (o)   - Immunizations: UTD     Fetal wellbeing:   - Anatomy US today     Postpartum:  - Will discuss at viability

## 2018-12-17 NOTE — Unmapped (Addendum)
-   CD4 and VL (o)   - C/w with Biktarvy   - Followed by ID with [facility] 4/22   - Targeted anatomy US today

## 2018-12-17 NOTE — Unmapped (Signed)
Assessment/Plan:   Plan   33 y.o. [id number] at [redacted]w[redacted]d presents for follow-up OB visit. Reviewed prenatal record. Doing well.    Problem List Items Addressed This Visit        Nervous and Auditory    Pseudotumor cerebri     - Reports persistent HAs. After a detailed conversation during her last [facility] visit, patient opted to reinitiate Diamox. Patient had trouble picking up her prescription. I checked her orders, and an Rx was sent to her specified pharmacy. Patient to call pharmacy.   - Current regimen unchanged   - Followed by Neurology with [facility] 4/15               Other    HIV (Chronic)     - CD4 and VL (o)   - C/w with Biktarvy   - Followed by ID with [facility] 4/22   - Targeted anatomy US today          Supervision of high risk pregnancy, unspecified, unspecified trimester     Maternal wellbeing:   - Labs: CD4 and VL (o)   - Immunizations: UTD     Fetal wellbeing:   - Anatomy US today     Postpartum:  - Will discuss at viability                  No orders of the defined types were placed in this encounter.    Return in about 4 weeks (around 01/14/2019).     Dr. Hipolito Bayley was immediately available.     Migdalia Dk, M.D., M.Phil   Clinical Instructor & Fellow  Department of Obstetrics and Gynecology  Division of Maternal-Fetal Medicine         Subjective:            33 y.o. [phone] at [redacted]w[redacted]d presents for follow-up OB visit. Patient feeling well with no major complaints. She reports that she presented to [facility] on 3/[facility] for tPTL. No evidence of PTL, and she was discharged home. Since then denies lower abdominal pain/cramping, leakage of fluid, vaginal bleeding. The patient requests a work note for both 3/6 and today's visit. The patient also has FMLA paperwork to complete.        Objective:        The following portions of the patient's history were reviewed and updated as appropriate: allergies, current medications, past obstetric history, past family history, past medical history, past social history, past surgical history and problem list      Flowsheet Vitals:  BP: 120/81 - Weight: 81.7 kg (180 lb 3.2 oz) - Urine Glucose: Negative - Urine Protein: Negative  Fetal Heart Rate: +US -   - Contractions: Irregular - Bleeding: No - Loss of Fluid: No    See flowsheet

## 2018-12-18 NOTE — Unmapped (Signed)
Met w/pt for RW     Pt had all docs needed for RW    RW eligible through 07/10/2019    (IPL: 128%; FPL: 95%)    Morgan Hebert

## 2018-12-23 DIAGNOSIS — B20 Human immunodeficiency virus [HIV] disease: Principal | ICD-10-CM

## 2018-12-25 NOTE — Unmapped (Signed)
[facility] Refill Coordination Note    Specialty Medication(s) to be Shipped:   Infectious Disease: Biktarvy    Other medication(s) to be shipped: n/a     [name], DOB: [birth year]  Phone: [phone] (home) [phone] (work)      All above HIPAA information was verified with patient.     Completed refill call assessment today to schedule patient's medication shipment from the [facility] [phone]).       Specialty medication(s) and dose(s) confirmed: Regimen is correct and unchanged.   Changes to medications: Soila reports no changes reported at this time.  Changes to insurance: No  Questions for the pharmacist: No    Confirmed patient received Welcome Packet with first shipment. The patient will receive a drug information handout for each medication shipped and additional FDA Medication Guides as required.       DISEASE/MEDICATION-SPECIFIC INFORMATION        N/A    SPECIALTY MEDICATION ADHERENCE     Medication Adherence    Patient reported X missed doses in the last month:  0  Specialty Medication:  Biktarvy  Patient is on additional specialty medications:  No  Demonstrates understanding of importance of adherence:  yes  Informant:  patient  Reliability of informant:  reliable  Patient is at risk for Non-Adherence:  No            Biktarvy  : 7 days of medicine on hand         SHIPPING     Shipping address confirmed in Epic.     Delivery Scheduled: Yes, Expected medication delivery date: 12/27/2018.     Medication will be delivered via Next Day Courier to the home address in Epic WAM.    Roderic Palau   [facility] Specialty Pharmacist

## 2018-12-25 NOTE — Unmapped (Signed)
[facility] Refill Coordination Note  Medication: BIKTARVY    Unable to reach patient to schedule shipment for medication being filled at [facility]. Left voicemail on phone.  As this is the 2ND unsuccessful attempt to reach the patient, no additional phone call attempts will be made at this time.      Phone numbers attempted: [phone]  [phone]  Last scheduled delivery: SHIPPED 2/19    Please call the [facility] at [phone] (option [phone]) should you have any further questions.      Thanks,  [facility] Specialty Team

## 2018-12-26 MED FILL — BIKTARVY 50 MG-200 MG-25 MG TABLET: 30 days supply | Qty: 30 | Fill #1 | Status: AC

## 2018-12-26 MED FILL — BIKTARVY 50 MG-200 MG-25 MG TABLET: ORAL | 30 days supply | Qty: 30 | Fill #1

## 2018-12-26 NOTE — Unmapped (Signed)
Reached out to patient to ensure she connected with [facility] and has meds.  Encouraged continued great adherence.  Left discreet message re this and re  COVID planning including Healthlink number. Will also provide over MyChart.    [name], MD, MPH   [facility]   [address], 1st floor   [place], [place]. [address]   Phone: [phone]   Fax: [phone]

## 2019-01-01 MED ORDER — ACETAZOLAMIDE 250 MG TABLET
ORAL_TABLET | 0 refills | 0 days | Status: CP
Start: 2019-01-01 — End: 2019-01-23

## 2019-01-04 NOTE — Unmapped (Signed)
Pt had a meesting at work Monday morning (3/30) needed to change appt rescheduled at [facility] for PM  ADMINCOVID

## 2019-01-08 ENCOUNTER — Encounter: Admit: 2019-01-08 | Discharge: 2019-01-08 | Payer: BLUE CROSS/BLUE SHIELD

## 2019-01-08 ENCOUNTER — Encounter: Admit: 2019-01-08 | Discharge: 2019-01-09 | Payer: BLUE CROSS/BLUE SHIELD

## 2019-01-08 DIAGNOSIS — B20 Human immunodeficiency virus [HIV] disease: Principal | ICD-10-CM

## 2019-01-09 NOTE — Unmapped (Signed)
Duration of Intervention: 15 minutes    SW received message from front desk to call pt due to concerns for working due to covid 19. 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 Pt requested letter stating provider urges manager to allow pt to work from home. SW read pt example letter which pt approved. SW sent letter to pt.    Bradly Bienenstock LCSWA, CHES

## 2019-01-10 ENCOUNTER — Encounter: Admit: 2019-01-10 | Discharge: 2019-01-10 | Payer: BLUE CROSS/BLUE SHIELD

## 2019-01-10 ENCOUNTER — Ambulatory Visit: Admit: 2019-01-10 | Discharge: 2019-01-10 | Payer: BLUE CROSS/BLUE SHIELD

## 2019-01-10 DIAGNOSIS — Z20828 Contact with and (suspected) exposure to other viral communicable diseases: Principal | ICD-10-CM

## 2019-01-10 DIAGNOSIS — Z1383 Encounter for screening for respiratory disorder NEC: Principal | ICD-10-CM

## 2019-01-10 NOTE — Unmapped (Signed)
Referral to [facility] from Virtual Care Provider   Received: Today   Message Contents   Sezmin Nancy Marus, MD  P Covid Rdc Schedulers      ??      Patient has been evaluated by a Virtual Care Provider and deemed to need Referral to [facility] for Respiratory Testing Only- No Exam      Called and spoke to patient to inform of appt 01/10/19 3pm at the [facility] location

## 2019-01-10 NOTE — Unmapped (Signed)
Patient has been screened by [facility] or [employer] and needs triage from [facility] due to patient concerns/request or no health composite score on file.      Coronavirus causes a lower respiratory tract illness. Common symptoms are cough, fever or difficulty breathing. Less common symptoms are body aches, chills, diarrhea, fatigue, headache, runny nose or sore throat.    Travel Screen reviewed Yes    Patient PCP office Veatrice Bourbon, MD    Currently do you have any of the following symptoms:  ? Cough Yes and productive of clear to yellow mucus and cough worsening  ? Fever >100.4 No and Hasn't checked but hasn't felt like she had fever  ? Difficulty breathing No  ? Body aches Yes and more in upper body  ? Chills No  ? Diarrhea No  ? Fatigue Yes  ? Headache Has chronic migraines daily  ? Runny nose Yes and clear and mild  ? Sore throat No  How long have you had the above symptoms? Saturday    Do you have any of the following conditions?   ? Heart or Lung condition No  ? Hypertension No  ? Diabetes No  ? Congestive Heart Disease No  ? COPD No  ? Asthma No  ? HIV Yes  ? Chronic kidney disease (renal failure) No  ? Liver disease No  ? Cystic Fibrosis No  ? Sickle Cell Anemia No  ? Neurologic disease that limits movement No  ? Moderate to severe developmental delay No  ? Usage of Immunosuppressant medications (used in transplant or Lupus patients) No  ? Usage of Chemotherapy medications No  ? Long-term steroid usage No    Women only  ? Are you currently pregnant? Yes and 25 weeks  ? When was your last menstrual period? N/A    Patient requests virtual appointment with provider  ? Warm transfer to Scheduling for [facility] appointment? Yes  ? Virtual Care Center appointment pool inbasket message sent N/A  ? Warm transfer to Scheduling for LCSW Virtual appointment Declined  ? COVID-19 Testing  o Does the patient meet criteria for COVID-19 testing N/A If NO the patient does not qualify for testing at this time, can see [facility] for symptom reassurance or [facility] for respiratory symptoms, patient will not be tested at the [facility].    []   Non-allergy respiratory symptoms of cough or shortness of breath starting in the last 7 days   AND 1 or mor of the following  []   Close contact with 6 feet for more than 10 minutes of a person diagnosed with COVID-19  []   Works in Teacher, music  []   Is 65 years or older  []   Is 2 years or younger  []   Is pregnant or postpartum within 2 weeks of delivery  []   Morbidly obese: BMI >40 or 100 pounds over ideal body weight  []   One of the above chronic conditions  o COVID [facility] inbasket message sent N/A    Script for Home Isolation and follow up instructions given Yes    Based on current recommendations, we ask that patients who have relatively stable symptoms to: stay home and self-quarantine for 14 days from last possible exposure, which will help contribute to social distancing. Perform good hand hygiene by washing your hands regularly with hot water and soap for at least 20 seconds, getting in between each finger, clean high touch services every day with a household cleaning product.  Cover cough or sneeze with a tissue, throw in trash after use and wash hands. Do not share dishes, glasses, or other household items.  Call the COVID-19 Helpline [phone] if symptoms worsen or new symptoms develop. For help after hours please call [facility] at [phone].

## 2019-01-10 NOTE — Unmapped (Addendum)
01/10/19       Morgan Hebert has respiratory symptoms and cannot return to work until:     1. All other symptoms have gone away and no fever for at least 3 days without medications to reduce fever  AND  2. At least 7 days have passed since symptoms appeared      Sincerely,         Keane Police, MD      You may have COVID-19, but we don???t know yet.   Please act like you have the virus even without a test or test results.     Coronavirus (COVID-19) is a virus that causes symptoms like the flu. The most common symptoms are cough and fever. Some patients have body aches, diarrhea (loose and frequent stools), and a runny nose. You can still spread the virus to others even if you do not have symptoms.    If you develop new symptoms or if your symptoms worsen:   1. Call your Primary Care Provider  2. Seek emergency care (call 911 or go to the emergency room) if you experience sudden or severe shortness of breath or any medical emergency    Follow these recommendations.    1. Rest and drink plenty of fluids  2. If you usually take them, take medications for fever and pain (2 tablets of Tylenol (also known as acetaminophen) every 8 hours)  3. Home isolation:  ? Separate yourself from other people in your home and stay at least 6 feet apart if possible  ? Stay in a room by yourself and use a separate bathroom, if available  ? Limit contact with pets and animals  ? Stay at home except to get medical care  ? Wear a face mask if you have symptoms and must be around other people (if available)  ? Caregivers should wear masks when caring for sick persons if available  ? Cover coughs and sneezes with tissue and throw into a trash can  ? Wash hands with soap or use an alcohol-based hand sanitizer (60% or higher) often for twenty seconds  ? Avoid touching your eyes, noses, and mouth  ? Do not share dishes, drinking glasses, cups, eating utensils, or food  ? Clean all surfaces often using soap and water or disinfecting sprays/wipes    You no longer need to be isolated in a separate room when:   1. No fever without fever reducing medications   2. Improvement in cough and shortness of breath, for at least 3 days AND   3. At least 7 days have passed since symptoms started    But you should still stay at home as much as possible. Keep 6 feet of distance between you and other people. Keep washing your hands and surfaces frequently, and avoid touching your face.    For family members: Assume those in close contact with you are likely to get Coronavirus, even if they have no symptoms. It can take 3-14 days to develop symptoms. Family members should be isolated in the home for 14 additional days after the patient???s fever is gone and respiratory symptoms are significantly improved.       For more information, visit [email or web address] or the [facility] Department of Health and [employer] [email or web address]

## 2019-01-10 NOTE — Unmapped (Signed)
Pt called reporting she is having fatigue, aching, and coughing, unsure of fever. Pt having good fetal movement, no contractions, leakage, or bleeding.     Advised pt to call Covid 19 helpline, number given. Pt agrees.

## 2019-01-10 NOTE — Unmapped (Signed)
TeleHealth Phone Encounter      I have identified myself to the patient and conveyed my credentials to Morgan Hebert  I have explained the capabilities and limitations of telemedicine and the patient and myself both agree that it is appropriate for their current circumstances/symptoms.  In case we get disconnected, patient's phone number is [phone] (home) [phone] (work)   Is there someone else in the room? No.       Assessment/Plan:      1. Exposure to Covid-19 Virus      Patient with history of HIV on antiretroviral therapy with last viral load undetected and [redacted] weeks pregnant with 6 days of worsening cough congestion mild shortness of breath.  Patient also works in a building where no social distancing is being practiced at this time.  I will copy Dr Rosemarie Beath in ID on this note  Discussed referral to [facility] for testing and patient amenable to this as first step  Given patient work note and discussed staying home      [facility] Testing Criteria (v. 12/31/2018)    Patients are eligible for respiratory pathogen testing if they have:    1) Symptoms starting within last seven days of non-allergy respiratory symptoms of cough or shortness of breath        AND    2) Meet ONE OR MORE of these criteria (check all that apply)    []  Had close contact (within 6 feet for 10 or more minutes) with a person diagnosed with COVID-19  []  Works in a healthcare setting  []  Is 56 years old or older  []  Is 18 years old or younger  [x]  Is pregnant or postpartum within 2 weeks of delivery  []  Is morbidly obese: BMI ? 40 or 100 pounds over ideal body weight        Has any of the following chronic conditions:  []  Diabetes mellitus  [x]  Immunosuppression, including caused by medications or by HIV infection  []  Pulmonary disease, including asthma  []  Cardiovascular disease  []  Hypertensive disease  []  Renal disease  []  Hepatic disease  []  Hematologic disease, including sickle cell disease  []  Neurological condition that limits movement Dispostion  Based upon the overall clinical picture, we recommend testing at one of our [facility] Centers.    Follow up  No follow-ups on file.    Visit conducted via telephone    Total time spent with patient: 15 minutes       Subjective:     Morgan Hebert is a 33 y.o. female who presents  for a telephone visit with respiratory symptoms amidst COVID-19 outbreak.     HPI  Patient is a 33 year old female, history of HIV on antiretroviral therapy, [redacted] weeks pregnant reporting 6 days of progressively worsening cough nasal congestion fatigue.  She also reports a mild sore throat and shortness of breath.  Initially she felt symptoms were most suggestive of allergies.  She has taken her allergy medications without improvement.  Cough is productive of both clear and green sputum.  She reports she has been working at a [employer] center where they are not able to practice physical distancing. She sits in cubicles in close proximity with one another.  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    Patient denies fevers chills chest pain wheezing.  She reports ongoing migraine headache which is unchanged.  She reports nightly sweats which is ongoing and stable and unchanged.  Patient reports she has excellent compliance taking her HIV medicines.    ROS  As per HPI.    I have reviewed the problem list, medications, and allergies and have updated/reconciled them if needed.          Objective:     As part of this Telephone Visit, no in-person exam was conducted.       Patient reports current temperature is 97.6  Speaking in full sentences    This patient visit was completed through the use of an audio/video or telephone encounter.      This patient encounter is appropriate and reasonable under the circumstances given the patient's particular presentation at this time. The patient has been advised of the potential risks and limitations of this mode of treatment (including, but not limited to, the absence of in-person examination) and has agreed to be treated in a remote fashion in spite of them. Any and all of the patient's/patient's family's questions on this issue have been answered.     The patient has also been advised to contact this office for worsening conditions or problems, and seek emergency medical treatment and/or call 911 if the patient deems either necessary.

## 2019-01-10 NOTE — Unmapped (Signed)
Spoke with Morgan Hebert on 01/10/19 about COVID-19.      Most recent CD4 counts & immunization records:  Absolute CD4 Count   Date Value Ref Range Status   10/22/2018 1,118 510-2,320 /uL Final   05/01/2015 1,696 (A) 359 - 1,519 /uL Final   10/24/2014 967 359 - 1,519 Final     CD4 T Cell Abs   Date Value Ref Range Status   03/28/2018 952 359 - 1,519 /uL Final   10/23/2017 1,072 359 - 1,519 /uL Final       Immunization History   Administered Date(s) Administered   ??? Influenza Vaccine Quad (IIV4 PF) 60mo+ injectable 06/30/2015, 06/15/2016, 07/04/2018         Symptom inventory:  Symptoms started on 03/28 (MM/DD)    [ ]  Fever over 100 F *  [x]  Cough, dry  *  [x]  Body aches *  [x]  Fatigue *  [x]  Shortness of breath *  [ ]  Diarrhea  [x]  Cough, productive  [x]  Chills  [ ]  Sweats  [x]  Nausea  [ ]  Vomiting  [ ]  Anosmia    NOTE   Symptoms of COVID-19 at hospital presentation are similar to influenza. History alone can't distinguish one from the other.  ? Fever (44-98% of patients)  ? Cough (46-82%)  ? Body aches or fatigue (11-52%)  ? Shortness of breath (3-31%)  ? Less common:   o sore throat   o headache  o productive cough   o coughing up blood   o diarrhea  o nausea    Exposure and testing history:  Patient has not had close contact with a confirmed case of COVID-19.  Patient has not traveled to a known ???state of emergency??? area.  Patient has been tested for COVID-19.  COVID-19 test result: in process    Patient has gotten this season's influenza vaccine.  Patient has not been tested for influenza.  Influenza test result: not yet done      Advice given to patient:  [x]  Follow public health guidance:  ? Cover any coughing or sneezing - use the bend of your elbow or a tissue (and then throw the tissue away immediately)  ? Avoid close contact with anyone who is sneezing or coughing, if possible  o If you're providing direct care for someone with a cough or who's sneezing, use a mask if you have one  ? Avoid crowded places as much as possible  ? If you must go out, stay at least 6 feet away from anyone sneezing or coughing  ? Wash hands often with soap and water for at least 20 seconds (that's about the time it takes to sing Happy Birthday to You twice)  ? Use an alcohol-based hand sanitizer with at least 60% alcohol if soap and water are not available  ? Clean ???high touch??? areas often - doorknobs, toilet handles, car steering wheels and handles, refrigerator handles, etc.  ? Avoid touching your eyes, nose, mouth with unwashed hands  ? Monitor for any symptoms consistent with COVID-19   ? If you develop symptoms, call the [facility] at [phone]    [x]  Your CD4 counts are good, and you're at average risk for getting COVID-19    [ ]  Schedule a visit with your PCP - but call ahead  [ ]  Schedule a visit here in ID Clinic (transfer to front desk)    [x]  Remain at home and monitor symptoms  [x]  Call [facility] Nurse  for further instructions  [ ]  Patient sent to local [facility] Center. (Prior to sending patient, must send inbasket message to: COVID [facility] Schedulers)  [ ]  Patient directed to their local health department. List of contact information for local Health Department here: [email or web address]  [ ]  Go to the nearest ED for urgent evaluation - Make sure patient calls ahead. If coming to [facility] ED, call [phone]. Explain you have been sent to the ER by your ID provider and have symptoms concerning for coronavirus. If non-[facility], advise to call main operator of the hospital they will present to.   [ ]  Call 911 for transportation to an ED      Next steps:  ? Follow-up call is needed. Patient evaluated for and tested for covid-19 today.   5 day history of symptoms consistent with covid.  She is in 2nd trimester of pregnancy.  She was told there would be results in 24-48 hours.

## 2019-01-10 NOTE — Unmapped (Signed)
Received incoming message requesting COVID Virtual Schedulers Pool follow up. Patient outreach encounter completed per COVID protocol.     Travel screen completed during this encounter: yes      Received incoming call from [facility].  Patient Health Composite Score is Yellow offered health care system recommendations and patient chose to speak to a [facility] nurse.

## 2019-01-10 NOTE — Unmapped (Signed)
What is COVID 19? COVID 19 or Coronavirus 2019 is respiratory illness that can spread from person to person. The virus is spread from person to person in close contact through respiratory droplets produced when and infected person coughs or sneezes.     Recommendations for patients who may have respiratory symptoms but have little risk of COVID (direct contact with positive patient, healthcare worker, recent travel to certain areas) at this time focus on monitoring symptoms and preventing spreading illness.      Preventing the spread of illness: CDC recommends everyday preventive actions to help prevent the spread of respiratory diseases, including:  ? Avoid close contact with people who are sick.  ? Avoid touching your eyes, nose, and mouth.  ? Stay home when you are sick.  ? Cover your cough or sneeze with a tissue, then throw the tissue in the trash.  ? Clean and disinfect frequently touched objects and surfaces using a regular household cleaning spray or wipe.  ? Do not share dishes, glasses, or other household items.   ? Follow CDC's recommendations for using a facemask.  ? CDC does not recommend that people who are well wear a facemask to protect themselves from respiratory diseases, including COVID-19.  o Facemasks should be used by people who show symptoms of COVID-19 to help prevent the spread of the disease to others. The use of facemasks is also crucial for health workers and people who are taking care of someone in close settings (at home or in a health care facility).  ? Wash your hands often with soap and water for at least 20 seconds, especially after going to the bathroom; before eating; and after blowing your nose, coughing, or sneezing.  o If soap and water are not readily available, use an alcohol-based hand sanitizer with at least 60% alcohol. Always wash hands with soap and water if hands are visibly dirty.    Social Distancing: Social Distancing helps to slow the spread of COVID-19 and other respiratory illness such as the flu.    ? Remain at home unless seeking medical care, obtaining essentials (prescriptions, groceries, supplies), or if needed working  ? Avoid events with greater than 50 people in attendance or poor ventilation (indoor arena, conference rooms  ? Maintain a 6 foot distance from others even when outdoors  ? Avoid handshakes and hugs when greeting others  ? Avoid contact with sick people    Home Quarantine:  Morgan Hebert separates and restricts the movement of people who were exposed to a contagious disease or have the possibility of having a contagious disease to see if they become sick. Quarantine in general means the separation of a person or group of people reasonably believed to have been exposed to a communicable disease but not yet symptomatic, from others who have not been so exposed, to prevent the possible spread of the communicable disease. Health experts recommend that self-quarantine lasts 14 days. Two weeks provides enough time for them to know whether or not they will become ill and be contagious to other people.    It is also important to remain at home to prevent the spread of respiratory illness if symptomatic and to prevent exposure to yourself if you do not currently have COVID-19. The CDC recommends persons stay at home if not tested for COVID-19 but with respiratory symptoms unless:   ? You are fever free for 72 hours without the use of medications that reduce fevers  AND  ? Other symptoms have improved (  cough, shortness of breath, runny nose, have improved).    ? For suspected COVID-19 patients at least 7 days have passed since symptoms appeared in addition to being fever free for 72 hours and symptom improvement.   ? You are seeking medical treatment.  Always call your medical provider before going to an office for additional guidance    Monitoring Symptoms: It is important to monitor present symptoms and follow provider recommendations for symptom care.  If your symptoms worsen, new symptoms develop, or your exposure to COVID-19 changes:  ? Call the [facility] COVID-19 Helpline [phone]  ? Call your Primary Care Provider's office  ? If Emergent care is needed for severe symptoms, such as severe shortness of breath, call 911.            What is COVID 19? COVID 19 or Coronavirus 2019 is respiratory illness that can spread from person to person. The virus is spread from person to person in close contact through respiratory droplets produced when and infected person coughs or sneezes.     Recommendations for patients who have stable respiratory symptoms and have risk of COVID exposure (direct contact with positive patient, healthcare worker, recent travel to certain areas) are focused on monitoring and treating symptoms and Home Isolation to prevent the spread of illness.      Symptoms of COVID-19: symptoms of COVID-19 can vary from person to person, mimic other respiratory illness (flu, allergies, asthma) and worsen quickly. It is important to monitor present symptoms and follow provider recommendations for symptom care. Symptoms can include:   ? Cough  ? Shortness of breath  ? Fever greater than 100.4  ? Body aches   ? Chills   ? Diarrhea   ? Fatigue  ? Headache   ? Runny nose   ? Sore throat     What to do if symptoms worsen:   ? Call the [facility] COVID-19 Helpline [phone]  ? Call [facility] at [phone] after hours  ? Call your Primary Care Provider's office  ? Seek Emergent Care if you develop  o Sudden and severe difficult breathing or shortness of breath  o Persistent pain or pressure in the chest  o New confusion  o Bluish lips or face    Home Isolation guidelines for suspected COVID-19 positive persons:  Home Isolation refers to separating oneself from other people in the home.  The CDC recommends Home Isolation for patients suspected of having COVID-19 regardless of testing results.    ? Separate yourself from other people in your home.   o Stay in a specific sick room and use a separate bathroom, if available.   o Limit contact with pets and animals. When possible have someone to care of household animals.  ? Stay at home except to get medical care. Restrict all activities outside of your home.  Do not go to work, school, or public areas, and do not use public transportation.    ? Always call your medical provider for instructions prior to going to an appointment  ? Wear a facemask if you are sick and around other people, caregivers should wear masks when caring for sick persons  ? Cover coughs and sneezes with tissue and throw into a lined trash care. Wash hand or use an alcohol-based hand sanitizer with at least 60% alcohol immediately.   ? Wash hands often  ? Avoid touching eyes, noses, and mouth  ? Do not share dishes, drinking glasses, cups, eating utensils.   ?  Disinfect surfaces often using EPA-registered products, always follow manufacture recommendations for handling      When to discontinue Home Isolation:  The CDC recommends persons with suspected COVID-19 or positive test results for COVID-19 continue with home isolation (separating until self from other people in the home) until all of the following criteria are met.    ? You are fever free for 72 hours without the use of medications that reduce fevers  AND  ? Other symptoms have improved (cough, shortness of breath, runny nose, have improved).  AND  ?  At least 7 days have passed since symptoms appeared

## 2019-01-10 NOTE — Unmapped (Signed)
Flowsheet Travel screen completed during this encounter: yes      Received incoming call from [facility] Nurse.  Patient Health Composite Score is [place] Si offered health care system recommendations and patient chose to schedule a virtual/telephone visit with a provider.       Patient agreed to Telephone visit with Adult  Provider  on 01/10/19 at 12:00 pm.

## 2019-01-10 NOTE — Unmapped (Signed)
Assessment     Morgan Hebert is a 33 y.o. female presenting to [facility] for COVID testing.     Plan     If no testing performed, pt counseled on routine care for respiratory illness.  If testing performed, COVID sent.  Patient directed to Home given findings during today's visit.    Subjective     Morgan Hebert is a 33 y.o. female who presents to the [facility] with complaints of the following:    Exposure History: In the last 14 days?     Have you traveled outside of [place]? No                City/State Country (if outside [place])                  Have you been in close contact with someone confirmed by a test to have COVID? (Close contact is within 6 feet for at least 10 minutes) No         Worked in a health care facility?   No           Symptoms:  Do you currently have any of the following:  Subjective fever (felt feverish) No   Chills No   Muscle aches Yes, how many days? 5   Runny nose Yes, how many days? 5   Sore Throat Yes, how many days? 5   Cough (new onset or worsening of chronic cough) Yes, how many days? 5   Shortness of breath Yes, how many days? 5   Nausea or vomiting Yes, how many days? 5   Headache Yes, how many days? 3   Abdominal Pain No   Diarrhea (3 or more loose stools in last 24 hours) No     History/Medical Conditions:    Do you have any of the following:   Asthma or emphysema or COPD No   Cystic Fibrosis No   Diabetes No   High Blood Pressure  No   Cardiovascular Disease No   Chronic Kidney Disease No   Chronic Liver Disease No   Chronic blood disorder like Sickle Cell Disease  No   Weak immune system due to disease or medication Yes, Which? pregnancy   Neurologic condition that limits movement  No   Developmental delay - Moderate to Severe  No   Recent (within past 2 weeks) or current Pregnancy Yes, 2nd Trimester   Morbid Obesity (>100 pounds over ideal weight) No       Objective     Given above, testing performed: Yes    Testing Performed:  Test Specimen Type Sent to   COVID-19  [facility]       Scribe's Attestation: AMIR H BARZIN obtained and performed the history, physical exam and medical decision making elements that were entered into the chart.  Signed by Selinda Eon, PT serving as Scribe, on 01/10/2019 3:12 PM      The documentation recorded by the scribe accurately reflects the service I personally performed and the decisions made by me. AMIR Burlene Arnt, DO

## 2019-01-10 NOTE — Unmapped (Signed)
01/10/19    Travel Screening Questions Completed.    Travel Screening Questions/Answers:  1). Have you traveled within the last 14 days?: No  2). Do you have new or worsening respiratory symptoms (e.g. cough, difficulty breathing)?: Yes  3). Have you had close contact with a person with confirmed COVID-19 in the last 14 days before symptoms began?: No      Patient answers Yes to Q2 AND/OR Q3 - [place] PATIENT: Advised Based on current recommendations, I would like you to talk with a nurse to see if you should be evaluated by a provider virtually  Stuffy, achy, No fever. Cough, productive. Since Saturday, 01/05/19. [redacted] weeks pregnant. 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     Routing to [employer]. I recommended that she isolate at home, use good hand hygiene, stay well hydrated.    The call was handled in the following manner: Documented patient response. Signed and closed encounter.

## 2019-01-11 NOTE — Unmapped (Signed)
Spoke with [name] on 01/11/19 about COVID-19.      Most recent CD4 counts & immunization records:  Absolute CD4 Count   Date Value Ref Range Status   10/22/2018 1,118 510-2,320 /uL Final   05/01/2015 1,696 (A) 359 - 1,519 /uL Final   10/24/2014 967 359 - 1,519 Final     CD4 T Cell Abs   Date Value Ref Range Status   03/28/2018 952 359 - 1,519 /uL Final   10/23/2017 1,072 359 - 1,519 /uL Final       Immunization History   Administered Date(s) Administered   ??? Influenza Vaccine Quad (IIV4 PF) 17mo+ injectable 06/30/2015, 06/15/2016, 07/04/2018         Symptom inventory:  Symptoms started on 03/28 (MM/DD)    [ ]  Fever over 100 F *  [ ]  Cough, dry  *  [x]  Body aches *  [x]  Fatigue *  [x]  Shortness of breath *  [ ]  Diarrhea  [x]  Cough, productive  [x]  Chills  [ ]  Sweats  [x]  Nausea  [x]  Vomiting  [ ]  Anosmia    NOTE   Symptoms of COVID-19 at hospital presentation are similar to influenza. History alone can't distinguish one from the other.  ? Fever (44-98% of patients)  ? Cough (46-82%)  ? Body aches or fatigue (11-52%)  ? Shortness of breath (3-31%)  ? Less common:   o sore throat   o headache  o productive cough   o coughing up blood   o diarrhea  o nausea    Exposure and testing history:  Patient has not had close contact with a confirmed case of COVID-19.  Patient has not traveled to a known ???state of emergency??? area.  Patient has been tested for COVID-19.  COVID-19 test result: negative    Patient has gotten this season's influenza vaccine.  Patient has not been tested for influenza.  Influenza test result: not yet done      Advice given to patient:  [x]  Follow public health guidance:  ? Cover any coughing or sneezing - use the bend of your elbow or a tissue (and then throw the tissue away immediately)  ? Avoid close contact with anyone who is sneezing or coughing, if possible  o If you're providing direct care for someone with a cough or who's sneezing, use a mask if you have one  ? Avoid crowded places as much as possible  ? If you must go out, stay at least 6 feet away from anyone sneezing or coughing  ? Wash hands often with soap and water for at least 20 seconds (that's about the time it takes to sing Happy Birthday to You twice)  ? Use an alcohol-based hand sanitizer with at least 60% alcohol if soap and water are not available  ? Clean ???high touch??? areas often - doorknobs, toilet handles, car steering wheels and handles, refrigerator handles, etc.  ? Avoid touching your eyes, nose, mouth with unwashed hands  ? Monitor for any symptoms consistent with COVID-19   ? If you develop symptoms, call the [facility] at [phone]    [x]  Your CD4 counts are good, and you're at average risk for getting COVID-19    [ ]  Schedule a visit with your PCP - but call ahead  [ ]  Schedule a visit here in ID Clinic (transfer to front desk)    [ ]  Remain at home and monitor symptoms  [ ]  Call [facility]  Nurse for further instructions  [ ]  Patient sent to [facility]. (Prior to sending patient, must send inbasket message to: COVID [facility] Schedulers)  [ ]  Patient directed to their local health department. List of contact information for local Health Department here: [email or web address]  [ ]  Go to the nearest ED for urgent evaluation - Make sure patient calls ahead. If coming to [facility] ED, call [phone]. Explain you have been sent to the ER by your ID provider and have symptoms concerning for coronavirus. If non-[facility], advise to call main operator of the hospital they will present to.   [ ]  Call 911 for transportation to an ED      Next steps:  ? Follow-up call is not needed.   ? COVID-19 test was negative, notified by [facility] on 04/03.   ? Fatigue, respiratory and GI symptoms are ongoing. She typically has seasonal allergies, but feels symptoms are worse than usual. She has not taken allergy medications, as she was waiting for COVID-19 test results. Notified Dr. Rosemarie Beath of ongoing symptoms and discussed rest, fluids, and symptom management with allergy meds and tylenol.  ? 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 She was able to get a new note from OB/GYN stating that she will not return to work.   ? Will remove from daily COVID call list. She knows to call ID clinic with worsening symptoms if needed.

## 2019-01-11 NOTE — Unmapped (Signed)
Negative COVID19 result received.  Patient notified via telephone call.    Given information about non-specific viral URI symptoms, patient verbalizes understanding.  How can you care for yourself at home?:  -Rest as much as possible until you feel better.  -Be safe with medicines. Take your medicine exactly as prescribed. Call your doctor if you think you are having a problem with your medicine. You will get more details on the specific medicine your doctor prescribes.  -Take an over-the-counter pain medicine, such as acetaminophen (Tylenol), ibuprofen (Advil, Motrin), or naproxen (Aleve), as needed for pain and fever. Read and follow all instructions on the label. Do not give aspirin to anyone younger than 20. It has been linked to Reye syndrome, a serious illness.  -Drink plenty of fluids, enough so that your urine is light yellow or clear like water. Hot fluids, such as tea or soup, may help relieve congestion in your nose and throat. If you have kidney, heart, or liver disease and have to limit fluids, talk with your doctor before you increase the amount of fluids you drink.  -Try to clear mucus from your lungs by breathing deeply and coughing.  -Gargle with warm salt water once an hour. This can help reduce swelling and throat pain. Use 1 teaspoon of salt mixed in 1 cup of warm water.  -Do not smoke or allow others to smoke around you. If you need help quitting, talk to your doctor about stop-smoking programs and medicines. These can increase your chances of quitting for good.  To avoid spreading the virus:  -Cough or sneeze into a tissue. Then throw the tissue away.  -If you don't have a tissue, use your hand to cover your cough or sneeze. Then clean your hand. You can also cough into your sleeve.  -Wash your hands often. Use soap and warm water. Wash for 15 to 20 seconds each time.  -If you don't have soap and water near you, you can clean your hands with alcohol wipes or gel.  Call your doctor now or seek immediate medical care if:    -You have a new or higher fever.     -Your fever lasts more than 48 hours.     -You have trouble breathing.     -You have a fever with a stiff neck or a severe headache.     -You are sensitive to light.     -You feel very sleepy or confused.     Patient verbalizes understanding    Patient states that she is still not feeling well. She declines a virtual visit at this time.

## 2019-01-14 ENCOUNTER — Encounter
Admit: 2019-01-14 | Discharge: 2019-01-15 | Payer: BLUE CROSS/BLUE SHIELD | Attending: Obstetrics & Gynecology | Primary: Obstetrics & Gynecology

## 2019-01-14 DIAGNOSIS — Z283 Underimmunization status: Secondary | ICD-10-CM

## 2019-01-14 DIAGNOSIS — G932 Benign intracranial hypertension: Principal | ICD-10-CM

## 2019-01-14 DIAGNOSIS — O9989 Other specified diseases and conditions complicating pregnancy, childbirth and the puerperium: Secondary | ICD-10-CM

## 2019-01-14 DIAGNOSIS — O0990 Supervision of high risk pregnancy, unspecified, unspecified trimester: Secondary | ICD-10-CM

## 2019-01-14 DIAGNOSIS — B20 Human immunodeficiency virus [HIV] disease: Secondary | ICD-10-CM

## 2019-01-14 NOTE — Unmapped (Signed)
Reviewed prenatal care thus far.   Plan for 1 hr GTT, RPR, CBC, TDAP at RN visit in 3 weeks.   Growth [place] at 30-34 weeks.

## 2019-01-14 NOTE — Unmapped (Signed)
Thank you for speaking to [place] today.   Today we discussed routine prenatal care and what to expect at 25 weeks.   We reviewed the plan for labs, glucose tolerance test, and fetal heart tones check in 2-3 weeks.   We will have another e-visit at that time.   We will call you in 24-48 hrs for your next visit.   If you have any questions please call the office.     Jacklynn Ganong MD

## 2019-01-14 NOTE — Unmapped (Signed)
Provider: Jacklynn Ganong  Division: Maternal-Fetal Medicine    Telephone Encounter  Return Prenatal Note    This encounter was conducted from [place], [place] via Telephone with the patient. [name] reports she is located at home during the visit encounter.     This patient visit was completed through the use of an audio/video or telephone encounter.      This patient encounter is appropriate and reasonable under the circumstances given the patient's particular presentation at this time. The patient has been advised of the potential risks and limitations of this mode of treatment (including, but not limited to, the absence of in-person examination) and has agreed to be treated in a remote fashion in spite of them. Any and all of the patient's/patient's family's questions on this issue have been answered.     The patient has also been advised to contact this office for worsening conditions or problems, and seek emergency medical treatment and/or call 911 if the patient deems either necessary.         Assessment/Plan:     Plan   33 y.o. [id number] at [redacted]w[redacted]d presents for a follow-up OB visit for high risk pregnancy.     Pseudotumor cerebri  Pt notes continuation of headaches, slightly improved with diamox. Notes that she was on a much higher dose prior to pregnancy. She declines increasing dosage at this time given acute URI symptoms. If her headache continues after URI sx resolve, plan to uptitrate diamox.     Supervision of high risk pregnancy, unspecified, unspecified trimester  Reviewed prenatal care thus far.   Plan for 1 hr GTT, RPR, CBC, TDAP at RN visit in 3 weeks.   Growth [place] at 30-34 weeks.     HIV  Follows with [place] [facility]. Last CD4 AND VL reviewed. Plan for repeat labs q3 months.       Follow up: 3 weeks tele health + 3 weeks RN visit for glucola, BP check, TDAP   Follow-up requests from provider: can schedule telephone or virtual video visit during COVID-19  Patient will call the clinic or use MyChart should anything change or any new issues arise.      Subjective     33 y.o. [id number] at 51w4dpresents for this Telephone follow-up OB visit for high risk pregnancy. Patient reports normal fetal movement and denies contractions, leakage of fluid, or vaginal bleeding.    She has recently been experiencing URI symptoms and fatigue. Was tested for covid 19 - negative. Continues to have sx at this time, but no shortness of breath or cough. Precautions reviewed. She is staying home and quarantined at this time.        Objective:   (Physical Examination (to extent possible); Laboratory and Test Data (as available))    The following portions of the patient's history were reviewed and updated as appropriate: allergies, current medications, past obstetric history, past family history, past medical history, past social history, past surgical history and problem list      General Appearance nonlabored during conversation, no distress    Pulmonary def   Cardiovascular def   Gastronintestinal def   Genitourinary def   Muskuloskeletal def   Neurologic AO x 3   Psychiatric  Integumentary  Appropriate mood and affect  def     Medical Decision Making:     20 minutes was spent on this medically necessary service. Pt visit by Telephone in the setting of State of Emergency due to COVID-19 Pandemic.  Jacklynn Ganong, MD

## 2019-01-14 NOTE — Unmapped (Signed)
Pt notes continuation of headaches, slightly improved with diamox. Notes that she was on a much higher dose prior to pregnancy. She declines increasing dosage at this time given acute URI symptoms. If her headache continues after URI sx resolve, plan to uptitrate diamox.

## 2019-01-14 NOTE — Unmapped (Signed)
Follows with [facility]. Last CD4 AND VL reviewed. Plan for repeat labs q3 months.

## 2019-01-15 NOTE — Unmapped (Signed)
I discussed the patient with the resident.I reviewed the resident???s note.  I agree with the resident???s findings and plan.   Florencia Reasons, MD

## 2019-01-21 NOTE — Unmapped (Signed)
[facility] Refill Coordination Note    Specialty Medication(s) to be Shipped:   Infectious Disease: Biktarvy    Other medication(s) to be shipped: N/A     Morgan Hebert, DOB: 1986-03-15  Phone: [phone] (home) [phone] (work)      All above HIPAA information was verified with patient.     Completed refill call assessment today to schedule patient's medication shipment from the [facility] [phone]).       Specialty medication(s) and dose(s) confirmed: Regimen is correct and unchanged.   Changes to medications: Morgan Hebert reports no changes at this time.  Changes to insurance: No  Questions for the pharmacist: No    Confirmed patient received Welcome Packet with first shipment. The patient will receive a drug information handout for each medication shipped and additional FDA Medication Guides as required.       DISEASE/MEDICATION-SPECIFIC INFORMATION        N/A    SPECIALTY MEDICATION ADHERENCE     Medication Adherence    Patient reported X missed doses in the last month:  0  Specialty Medication:  BIKTARVY  Patient is on additional specialty medications:  No                      SHIPPING     Shipping address confirmed in Epic.     Delivery Scheduled: Yes, Expected medication delivery date: 4/16.     Medication will be delivered via Next Day Courier to the home address in Epic WAM.    Morgan Hebert   [facility] Specialty Technician

## 2019-01-21 NOTE — Unmapped (Signed)
Pt called nurse line at [facility] stating that she is very upset.  Pt states that she is being transferred around, and has sent the provider several messages regarding her concerns. Pt states that she needs a note from provider stating that she is able to pick up her equipment from work.  Pt states that her job is closing down, she needs this equipment, so that she can work from home and get paid.  Informed pt that provider did message her back today, and will be calling her back this afternoon.  Pt wants reassurance from nurse that she will be called back by provider.  Informed pt that this nurse will message Dr Kathrynn Speed regarding her concern.  Pt verbalized understanding.

## 2019-01-22 NOTE — Unmapped (Signed)
Pt contacted me via mychart. I called patient last night in response. She notes that she needs a letter from MD for work to allow her to pick up her computer to commence working remotely. I agreed to write this letter, documenting the following:     1) COVID-19 testing negative per [facility] chart review   2) asymptomatic x 72 hrs from a respiratory standpoint   3) It has been > 7 days since onset of prior respiratory symptoms   4) Prefer to pick up laptop when fewer people are in building or curbside in order to maintain social distancing.     Of note the patient has two high risk conditions: pregnancy and HIV.     She provided e-mail address of her HR and her own that she would like me to send the letters to.  I will provide this documentation today.     Jacklynn Ganong MD

## 2019-01-23 ENCOUNTER — Encounter: Admit: 2019-01-23 | Discharge: 2019-01-24 | Payer: BLUE CROSS/BLUE SHIELD

## 2019-01-23 DIAGNOSIS — G932 Benign intracranial hypertension: Principal | ICD-10-CM

## 2019-01-23 MED ORDER — ACETAZOLAMIDE 250 MG TABLET
ORAL_TABLET | Freq: Two times a day (BID) | ORAL | 3 refills | 0 days | Status: CP
Start: 2019-01-23 — End: 2019-03-06

## 2019-01-23 MED FILL — BIKTARVY 50 MG-200 MG-25 MG TABLET: ORAL | 30 days supply | Qty: 30 | Fill #2

## 2019-01-23 MED FILL — BIKTARVY 50 MG-200 MG-25 MG TABLET: 30 days supply | Qty: 30 | Fill #2 | Status: AC

## 2019-01-23 NOTE — Unmapped (Addendum)
Neurology Follow-up Visit Note     [facility] [address]  [facility] [place] [address] [place]  [address]  [place] [address]  [phone]    Date: 01/23/2019   Patient Name: Morgan Hebert   MRN: [id number]   PCP: Margaretha Glassing  Referring Provider: Pcp, None Per Patient       Assessment and Plan          Morgan Hebert is a 33 y.o. female with a past medical history of pseudotumor cerebri, multifactorial headaches, chronic GI complaints (possibly of functional nature), and HIV (on Biktarvy, VL 240 in June 2019) presenting for follow-up of her multifactorial headaches and pseudotumor cerebri.     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 Patient learned that she was pregnant on August 6th, and discontinued both Zonisamide and Diamox soon thereafter on the advice of her OBGYN (who is OK with Diamox being restarted in the second trimester). S/p bilateral occipital nerve block 05/30/2018, from which she reports minimal improvement. She miscarried and underwent dilation & suction evacuation on 06/14/2018, after which she restarted her usual Diamox. She then had a positive pregnancy test in October 2019, and has since discontinued all medication except Tylenol, promethazine and sumitriptan (all on a PRN basis). Over the past 2 months her usual headache has been relatively stable, with some minor improvement since restarting Diamox 250mg  BID in March 2020 (deferred until after first trimester by OB, and actually prescribed in TID dosing). Though her headache has been more or less manageable with her currnet medications, given her previous Grade I bilateral papilledema and the known tendency of IIH to worsen in pregnancy, we advise trending her vision for any blurring or field deficits closely; any such symptoms would warrant more urgent ophthalmology evaluation.  - Increase Diamox to 500mg  BID; will liaise with ophthalmology about any upper limits of dosing in the 3rd trimester, and to carefully trend her vision over the remainder of pregnancy,  for any blurring or visual field deficits that would prompt a more urgent ophthalmology evaluation.  - Can use Tylenol 1g q8h PRN, promethazine 25mg  q8h PRN, and sumatriptan 50mg  q2h PRN as abortives. Aim to limit use of Tylenol to 2-3x per week, to limit medication overuse headache.  - Advised to try to limit stressors, maintain hydration & regular sleep schedule, limit screen time, and limit prolonged neck flexion   - Patient to re-schedule missed appointment with [facility] as able for perimetry and retinal evaluation  [facility] follow-up in 3-6 months.     Reviewed the differential diagnosis, plan of care in detail, as well as other elements as detailed above.  A written summary was provided to the patient, including a current medication and allergy list, problem list and plan of care. In addition, my contact information was provided to the patient, in writing.  All the patient's questions and concerns were addressed to their stated satisfaction .      Return Visit Discussed: No follow-ups on file.     I spent 15 minutes on the phone with the patient. This patient was discussed with Dr. Seward Meth who agrees with the above assessment and plan.       The patient  was physically located in [place] or a state in which I am permitted to provide care. The patient and/or parent/gauardian understood that s/he may incur co-pays and cost sharing, and agreed to the telemedicine visit. The visit was completed via phone and/or video, which was appropriate and reasonable under the circumstances given the patient's presentation at the time.    The patient and/or parent/guardian has been advised of the potential risks and limitations of this mode of treatment (including, but not limited to, the absence of in-person examination) and has agreed to be treated using telemedicine. The patient's/patient's family's questions regarding telemedicine have been answered.     If the phone/video visit was completed in an ambulatory setting, the patient and/or parent/guardian has also been advised to contact their provider???s office for worsening conditions, and seek emergency medical treatment and/or [facility] 911 if the patient deems either necessary.    Mindi Curling, MBBS  PGY-2 Neurology    *Patient note was created using Dragon Dictation.  Any errors in syntax or even information may not have been identified and edited on initial review prior to signing this note.         HPI         HPI: Morgan Hebert is a 33 y.o. female with a past medical history of pseudotumor cerebri, multifactorial headaches, chronic GI complaints (possibly of functional nature), and HIV (on Biktarvy, VL 240 in June 2019) presenting for follow-up of her multifactorial headaches and pseudotumor cerebri. Patient was last seen by [facility] on 05/02/2018.        To review their relevant history, To review her relevant headache history, she initially presented to [facility] ED in 11/2013 with 4 months of headache which were described as initially intermittent with increase in frequency and later becoming continuous. Headaches were right-sided and at times associated with photophobia, phonophobia, nausea with occasional emesis, vertigo, and right ear tinnitus. She also complained of bilateral blurry vision and double vision. She had not taken oral OCPs, vitamin A, or tetracyclines. Fundoscopic examination demonstrated mild disc edema bilaterally. She underwent MRI brain which showed no abnormalities and lumbar puncture which showed normal CSF and elevated opening pressure of 42 cm H2O. ??She was started on acetazolamide 500 mg twice daily for pseudotumor cerebri; later this was increased to 1000 mg bid for improved symptoms control and improved papilledema. She later became asymptomatic allowing for the discontinuation of acetazolamide 1000 mg twice daily. In 01/2014 due to continued migraines, she was started on prophylaxis with propranolol, which has subsequently been discontinued due to hypotension. ??In 2016, she endorsed a 30 pound weight gain accompanied by increased frequency of headache. ??Her headache characteristics had changed at that time with a near continuous global headache upon which was superimposed periods of daily severe pressure behind her eyes and in her neck and shoulders. ??At that time she was using promethazine and naproxen for daily abortive treatment with success. ??In early 2017, she was restarted on Diamox up to 1500 mg twice daily but was receiving infrequent ophthalmologic care. Fortunately she was seen on 12/01/2015 and found to have bilateral retinal thinning temporally as well as decreased visual fields on testing in the OS nasal field. Repeat exam in April 2017 demonstrated some improvement of her OS visual fields.  ??  Since that time, patient has intermittently been seen in the emergency room for headache and has been tried on several medications prescribed by other providers. ??This has included desipramine (caused significant sedation),??indomethacin (caused significant abdominal pain),  and amitriptyline (cause sedation). ??Of note, the TCAs were started in the setting of concurrent Phenergan and Bentyl use which may have worsened the sedating side effects. ??She is also been evaluated with CPAP in 2017 which did not demonstrate any OSA. ??At that time her husband reported snoring without periods of apnea. ??This is been stable per him. 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 ??However in the past, her ophthalmology exams??have been stable to improving. ??We have discussed several times that surgical intervention may not alter her headache syndrome. ??Rather this is designed to preserve her vision.  ??  In late March 2019, the patient reports that she was seen by a local optometrist who reported to her that she had high pressure in her eyes. ??At that time she had been on Diamox 1000 mg twice daily. ??Over the phone, high increase this to 1500 mg twice daily and recommend that she get an LP in the emergency department. ??Unfortunately, the LP was unsuccessful and caused back pain. ??She reports that since earlier this year, she has had nearly daily headache that is described as starting in the posterior region and becoming global. ??There is then an intense pressure behind her eyes. ??She reports some mild photophobia and phonophobia as well as intermittent nausea and vomiting. ??She also endorses some mild to moderate tinnitus. ??Since her last evaluation, she has had significant changes in her medication regimen. ??She is no longer taking her GI cocktails. ??She is not taking any migraine prophylaxis. ??She takes ibuprofen 800 mg 3-4 times daily with mild to moderate headache relief. ??She is not followed regularly by an ophthalmologist. ??She does report losing a few pounds in the last few weeks as she is watching her diet more carefully. ??Her current medication regimen includes her Diamox 1500 mg twice daily, her HIV antiviral, ibuprofen as above, and Benadryl 25 mg nightly for allergies. ??She takes this year-round. ??She denies any change in her vision but reports that the optometrist gave her a stronger prescription. ??She continues to endorse good follow-up with her infectious disease provider. ??She is tolerating the Diamox well with mild tingling paresthesias which she has gotten used to..    As of July 2019 she reports no improvement, and possibly some worsening, in frequency and intensity of her chronic daily headache since last in neurology clinic in May 2019, at which point she was initiated on zonisamide 50mg  qd and uptitrated to Diamox 2g BID. She was also referred to [facility] ophthalmology for assessment of her visual fields and retinal testing, but was never contacted by the clinic. She describes daily headaches, worsened by heat, light, and certain smells that originate at the back of her neck and radiate over the top of her head bilaterally to her eyes, at which point she may experience associated ringing in her ears, nausea & vomiting, double vision, photophobia, and phonophobia. She feels these headache range from 5-9/10 in intensity from day to day, R > L, and are characterized by a continuous, sharp pain that she finds debilitating on approximately half of all days. She has declined to use abortive medications like Imitrex and promethazine due to their sedating effects, and she prefers alternating between ibuprofen and naproxen, which both give her partial relief. Of note, the nausea & vomiting which she describes in the context of her headaches has limited her appetite, and she reported losing approximately 20 pounds since her last clinic visit in May, with  associated improvement in her snoring. Given a component of occipital neuralgia contributing to her multifactorial headache, planned for trial of 2-3 week trial of cold compresses to bilateral occipital grooves for BID.    As of 05/11/2018 she reports a worsening of her chronic daily headache to a continuous pain that limits her ability to work 2-3 days each week. She reports good compliance with the planned trial of cold compresses to her occipital nerve grooves anf her zonisamide 50mg  qd. She is only using ibuprofen & naproxen as abortive medications. Her pain still originates in her posterior shoulder girdle, radiating over her head and behind her eyes to create a throbbing pain associated with nausea, vomiting, intermittent blurring of vision & double vision, phonophobia, and photophobia. She feels this headache is slightly increased in its continuity and intensity from last week, prompting her to return to clinic after such a short interval.    On 05/30/2018 she reports learning that she was pregnant on August 6th, and discontinued both Zonisamide and Diamox soon thereafter on the advice of her OBGYN (who is OK with Diamox being restarted in the second trimester). She is [redacted] weeks pregnant as of 05/31/2018. Since then her usual headache have become more severe in nature due to the contraindication of her usual abortive medication, ibuprofen. She gets some relief from both her sumatriptan and promethazine, but is limited in the use of the former to 4 doses per week and has a limited supply of the latter, which she saves for bedtime to be able to sleep. She denies changes in her vision, visual fields or visual acuity.    She miscarried in early September and underwent removal of products of conception on 9/5, after which she resumed her usual Diamox and Zonisamide. She feels there was little change in her headaches through this period, which continue to occur on a daily basis, and are often debilitating, causing her to miss multiple days of work per week. She defers abortive therapies until after work as they make her sleepy, with the exception of Ibuprofen. The headache continue to originate in her posterior shoulder girdle, R > L, with sharp & shooting pains radiating to her forehead bilaterally. These are worsened by excessive screen time, and often require her to lie down in a dark, quiet room to achieve relief. She feels stressors at home, in the form of childcare, are also contributing to her headaches. She also learned that she was newly pregnant in October 2019, and ceased all medications except PRN Tylenol, promethazine and sumatriptan. She endorses no significant differences in her headache severity, frequency, or duration going on or off the medications described above. Interval History: Patient endorses relatively stable, daily headache despite restarting Diamox 250mg  TID (actually taking BID) in early March. She feels she has noticed the headache on waking more over the past 2 months (denies any positional component), but denies any very severe episodes that would prompt her to use her sumitriptan. Instead she uses 1-1.5g of Tylenol per day, and 1-2 tablets of promethazine 25mg  daily. Between these three medications, she is able to get her pain to a manageable level, but she reports it never abates entirely. She also feels her nausea has worsened over the past several months, but acknowledges that this is difficult to tease apart from pregnancy-associated nausea. She denies any significant changes in blurring of the vision or loss of visual fields, but has still not been formally evaluated by ophthalmology/optometry.     Allergies  Allergen Reactions   ??? 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        Current Outpatient Medications   Medication Sig Dispense Refill   ??? acetaZOLAMIDE (DIAMOX) 250 MG tablet TAKE ONE TABLET BY MOUTH 3 TIMES A DAY 90 tablet 0   ??? bictegrav-emtricit-tenofov ala (BIKTARVY) 50-200-25 mg tablet Take 1 tablet by mouth daily. 30 tablet 11   ??? diphenhydrAMINE (BENADRYL) 25 mg capsule Take 25 mg by mouth.     ??? hydrocortisone 1 % ointment Apply topically Two (2) times a day. 30 g 11   ??? ketoconazole (NIZORAL) 2 % cream Apply 1 application topically daily. (Patient not taking: Reported on 11/19/2018) 15 g 11   ??? promethazine (PHENERGAN) 25 MG tablet Take 1 tablet (25 mg total) by mouth every eight (8) hours as needed for nausea (Nausea or headache). 90 tablet 2   ??? SUMAtriptan (IMITREX) 50 MG tablet Take 1 tablet (50 mg total) by mouth every two (2) hours as needed for migraine (Maximum 2 doses/day & 4 doses/week). 16 tablet 2     No current facility-administered medications for this visit. Past Medical History:   Diagnosis Date   ??? Abnormal mammogram    ??? Constipation    ??? Diarrhea    ??? Environmental allergies    ??? HIV (human immunodeficiency virus infection) (CMS-HCC)    ??? HIV (human immunodeficiency virus infection) (CMS-HCC)    ??? IUD (intrauterine device) in place    ??? Migraine    ??? Obesity    ??? Pseudotumor cerebri    ??? STD (sexually transmitted disease)        Past Surgical History:   Procedure Laterality Date   ??? DILATION AND CURETTAGE OF UTERUS     ??? LUMBAR PUNCTURE     ??? PR COLONOSCOPY W/BIOPSY SINGLE/MULTIPLE  07/03/2014    Procedure: COLONOSCOPY, FLEXIBLE, PROXIMAL TO SPLENIC FLEXURE; WITH BIOPSY, SINGLE OR MULTIPLE;  Surgeon: Billie Ruddy, MD;  Location: [facility];  Service: Gastroenterology   ??? PR DILATION/CURETTAGE,DIAGNOSTIC N/A 06/14/2018    Procedure: DILATION AND CURETTAGE, DIAGNOSTIC AND/OR THERAPEUTIC (NON OBSTETRICAL);  Surgeon: Nelle Don, MD;  [facility];  Service: [facility] Primary Gynecology   ??? PR UPPER GI ENDOSCOPY,BIOPSY N/A 07/03/2014    Procedure: UGI ENDOSCOPY; WITH BIOPSY, SINGLE OR MULTIPLE;  Surgeon: Billie Ruddy, MD;  Location: [facility];  Service: Gastroenterology   ??? WISDOM TOOTH EXTRACTION         Social History     Socioeconomic History   ??? Marital status: Married     Spouse name: Not on file   ??? Number of children: Not on file   ??? Years of education: Not on file   ??? Highest education level: Not on file   Occupational History   ??? Not on file   Social Needs   ??? Financial resource strain: Not on file   ??? Food insecurity     Worry: Not on file     Inability: Not on file   ??? Transportation needs     Medical: Not on file     Non-medical: Not on file   Tobacco Use   ??? Smoking status: Former Smoker     Types: Cigarettes   ??? Smokeless tobacco: Never Used   Substance and Sexual Activity   ??? Alcohol use: Not Currently     Alcohol/week: 0.0  standard drinks     Comment: rare   ??? Drug use: Not Currently Types: Marijuana   ??? Sexual activity: Yes     Partners: Male   Lifestyle   ??? Physical activity     Days per week: Not on file     Minutes per session: Not on file   ??? Stress: Not on file   Relationships   ??? Social Wellsite geologist on phone: Not on file     Gets together: Not on file     Attends religious service: Not on file     Active member of club or organization: Not on file     Attends meetings of clubs or organizations: Not on file     Relationship status: Not on file   Other Topics Concern   ??? Not on file   Social History Narrative    Patient lives in [place], [place] with her fiance and daughter.  She is not employed.       Family History   Problem Relation Age of Onset   ??? Cervical cancer Mother    ??? Diabetes Mother    ??? Asthma Mother    ??? Hypertension Mother    ??? Diabetes Maternal Aunt    ??? Hypertension Maternal Aunt    ??? Breast cancer Maternal Grandmother    ??? Cancer Maternal Grandmother    ??? No Known Problems Daughter    ??? Glaucoma Neg Hx         Review of Systems     A 10-system review of systems was conducted and was negative except as documented above in the HPI.       Objective        Vital signs: There were no vitals taken for this visit.     Physical Exam:  Physical examination deferred, as this consultation was conducted via telephone.      Diagnostic Studies     All Labs Last 24hrs:   No results found for this or any previous visit (from the past 24 hour(s)).  Risk Stratification:   Cholesterol (mg/dL)   Date Value   16/07/9603 140     Triglycerides (mg/dL)   Date Value   54/06/8118 99     HDL (mg/dL)   Date Value   14/78/2956 42     LDL Calculated (mg/dL)   Date Value   21/30/8657 78     TSH (uIU/mL)   Date Value   07/04/2018 2.190     Hemoglobin A1C (%)   Date Value   10/22/2018 5.2

## 2019-01-23 NOTE — Unmapped (Signed)
You were called today by [facility] for follow-up of your multifactorial daily headaches, most significantly with a component of IIH. Your plan is listed below:  - Increase Diamox to 500mg  BID; will liaise with ophthalmology about any upper limits of dosing in the 3rd trimester, and carefully trend your vision over the remainder of pregnancy for any blurring or visual field deficits that would prompt a more urgent ophthalmology evaluation.  - Can use Tylenol 1g q8h PRN, promethazine 25mg  q8h PRN, and sumatriptan 50mg  q2h PRN as abortives. Aim to limit use of Tylenol to 2-3x per week, to limit medication overuse headache.  - Advised to try to limit stressors, maintain hydration & regular sleep schedule, limit screen time, and limit prolonged neck flexion   - Try to re-schedule your missed appointment with [facility]hthalmology as able for perimetry and retinal evaluation  [facility] Clinic follow-up in 3-6 months.     Regards,   [facility]  Clinic Phone #: [phone]

## 2019-01-28 ENCOUNTER — Other Ambulatory Visit: Payer: Self-pay

## 2019-01-28 ENCOUNTER — Ambulatory Visit
Admission: RE | Admit: 2019-01-28 | Discharge: 2019-01-28 | Disposition: A | Discharge status: Home / Self Care | Payer: 59 | Source: Ambulatory Visit | Attending: Maternal & Fetal Medicine | Admitting: Maternal & Fetal Medicine

## 2019-01-28 DIAGNOSIS — O2622 Pregnancy care for patient with recurrent pregnancy loss, second trimester: Secondary | ICD-10-CM | POA: Insufficient documentation

## 2019-01-28 DIAGNOSIS — Z21 Asymptomatic human immunodeficiency virus [HIV] infection status: Secondary | ICD-10-CM | POA: Insufficient documentation

## 2019-01-28 DIAGNOSIS — O09893 Supervision of other high risk pregnancies, third trimester: Secondary | ICD-10-CM

## 2019-01-28 DIAGNOSIS — O98712 Human immunodeficiency virus [HIV] disease complicating pregnancy, second trimester: Secondary | ICD-10-CM | POA: Diagnosis not present

## 2019-01-28 DIAGNOSIS — Z3A27 27 weeks gestation of pregnancy: Secondary | ICD-10-CM | POA: Insufficient documentation

## 2019-01-28 NOTE — Unmapped (Signed)
01/28/19    Travel Screening Questions Completed.    Travel Screening Questions/Answers:  1). Have you traveled within the last 14 days?: No  2). Do you have new or worsening respiratory symptoms (e.g. cough, difficulty breathing)?: No  3). Have you had close contact with a person with confirmed COVID-19 in the last 14 days before symptoms began?: No      Negative Travel Screen: Patient answered NO to questions 2 and 3. Proceed with current scheduling protocol for your clinic.       The call was handled in the following manner: Documented patient response and encounter closed

## 2019-01-30 ENCOUNTER — Other Ambulatory Visit: Payer: Self-pay

## 2019-01-30 ENCOUNTER — Observation Stay
Admission: RE | Admit: 2019-01-30 | Discharge: 2019-01-30 | Disposition: A | Discharge status: Home / Self Care | Payer: 59 | Attending: Obstetrics and Gynecology | Admitting: Obstetrics and Gynecology

## 2019-01-30 DIAGNOSIS — Z87891 Personal history of nicotine dependence: Secondary | ICD-10-CM | POA: Diagnosis not present

## 2019-01-30 DIAGNOSIS — Z3A27 27 weeks gestation of pregnancy: Secondary | ICD-10-CM | POA: Diagnosis not present

## 2019-01-30 DIAGNOSIS — O99352 Diseases of the nervous system complicating pregnancy, second trimester: Secondary | ICD-10-CM | POA: Diagnosis not present

## 2019-01-30 DIAGNOSIS — Z79899 Other long term (current) drug therapy: Secondary | ICD-10-CM | POA: Diagnosis not present

## 2019-01-30 DIAGNOSIS — O98712 Human immunodeficiency virus [HIV] disease complicating pregnancy, second trimester: Secondary | ICD-10-CM | POA: Insufficient documentation

## 2019-01-30 DIAGNOSIS — G43909 Migraine, unspecified, not intractable, without status migrainosus: Secondary | ICD-10-CM | POA: Diagnosis not present

## 2019-01-30 DIAGNOSIS — Z8249 Family history of ischemic heart disease and other diseases of the circulatory system: Secondary | ICD-10-CM | POA: Diagnosis not present

## 2019-01-30 DIAGNOSIS — O4702 False labor before 37 completed weeks of gestation, second trimester: Secondary | ICD-10-CM

## 2019-01-30 LAB — CBC WITH DIFFERENTIAL/PLATELET
Abs Immature Granulocytes: 0.13 10*3/uL — ABNORMAL HIGH (ref 0.00–0.07)
Basophils Absolute: 0.1 10*3/uL (ref 0.0–0.1)
Basophils Relative: 0 %
Eosinophils Absolute: 0.2 10*3/uL (ref 0.0–0.5)
Eosinophils Relative: 1 %
HCT: 35.7 % — ABNORMAL LOW (ref 36.0–46.0)
Hemoglobin: 11.7 g/dL — ABNORMAL LOW (ref 12.0–15.0)
Immature Granulocytes: 1 %
Lymphocytes Relative: 19 %
Lymphs Abs: 2.2 10*3/uL (ref 0.7–4.0)
MCH: 29.5 pg (ref 26.0–34.0)
MCHC: 32.8 g/dL (ref 30.0–36.0)
MCV: 90.2 fL (ref 80.0–100.0)
Monocytes Absolute: 0.8 10*3/uL (ref 0.1–1.0)
Monocytes Relative: 7 %
Neutro Abs: 8.6 10*3/uL — ABNORMAL HIGH (ref 1.7–7.7)
Neutrophils Relative %: 72 %
Platelets: 223 10*3/uL (ref 150–400)
RBC: 3.96 MIL/uL (ref 3.87–5.11)
RDW: 14 % (ref 11.5–15.5)
WBC: 11.9 10*3/uL — ABNORMAL HIGH (ref 4.0–10.5)
nRBC: 0 % (ref 0.0–0.2)

## 2019-01-30 LAB — WET PREP, GENITAL
Clue Cells Wet Prep HPF POC: NONE SEEN
Sperm: NONE SEEN
Trich, Wet Prep: NONE SEEN
Yeast Wet Prep HPF POC: NONE SEEN

## 2019-01-30 LAB — URINALYSIS, ROUTINE W REFLEX MICROSCOPIC
Bilirubin Urine: NEGATIVE
Glucose, UA: NEGATIVE mg/dL
Hgb urine dipstick: NEGATIVE
Ketones, ur: NEGATIVE mg/dL
Leukocytes,Ua: NEGATIVE
Nitrite: NEGATIVE
Protein, ur: NEGATIVE mg/dL
Specific Gravity, Urine: 1.004 — ABNORMAL LOW (ref 1.005–1.030)
pH: 8 (ref 5.0–8.0)

## 2019-01-30 LAB — URINE DRUG SCREEN, QUALITATIVE (ARMC ONLY)
Amphetamines, Ur Screen: NOT DETECTED
Barbiturates, Ur Screen: NOT DETECTED
Benzodiazepine, Ur Scrn: NOT DETECTED
Cannabinoid 50 Ng, Ur ~~LOC~~: POSITIVE — AB
Cocaine Metabolite,Ur ~~LOC~~: NOT DETECTED
MDMA (Ecstasy)Ur Screen: NOT DETECTED
Methadone Scn, Ur: NOT DETECTED
Opiate, Ur Screen: NOT DETECTED
Phencyclidine (PCP) Ur S: NOT DETECTED
Tricyclic, Ur Screen: NOT DETECTED

## 2019-01-30 LAB — TYPE AND SCREEN
ABO/RH(D): AB POS
Antibody Screen: NEGATIVE

## 2019-01-30 LAB — CHLAMYDIA/NGC RT PCR (ARMC ONLY): Chlamydia Tr: NOT DETECTED

## 2019-01-30 LAB — GROUP B STREP BY PCR: Group B strep by PCR: NEGATIVE

## 2019-01-30 LAB — CHLAMYDIA/NGC RT PCR (ARMC ONLY)??????????: N gonorrhoeae: NOT DETECTED

## 2019-01-30 MED ORDER — TERBUTALINE SULFATE 1 MG/ML IJ SOLN: 0.2500 mg | Freq: Once | INTRAMUSCULAR | Status: DC | PRN

## 2019-01-30 MED ORDER — ZOLPIDEM TARTRATE 5 MG PO TABS: 5.0000 mg | ORAL_TABLET | Freq: Every evening | ORAL | Status: DC | PRN

## 2019-01-30 MED ORDER — LACTATED RINGERS IV SOLN: 500.0000 mL/h | Freq: Once | INTRAVENOUS | Status: AC

## 2019-01-30 MED ORDER — INDOMETHACIN 50 MG PO CAPS
50.0000 mg | ORAL_CAPSULE | Freq: Once | ORAL | Status: AC
  Administered 2019-01-30: 13:00:00 50 mg via ORAL
  Filled 2019-01-30: qty 1

## 2019-01-30 MED ORDER — BETAMETHASONE SOD PHOS & ACET 6 (3-3) MG/ML IJ SUSP
INTRAMUSCULAR | Status: AC
  Filled 2019-01-30: qty 5

## 2019-01-30 MED ORDER — PRENATAL MULTIVITAMIN CH: 1.0000 | ORAL_TABLET | Freq: Every day | ORAL | Status: DC

## 2019-01-30 MED ORDER — INDOMETHACIN 25 MG PO CAPS
25.0000 mg | ORAL_CAPSULE | Freq: Four times a day (QID) | ORAL | Status: DC
  Administered 2019-01-30: 19:00:00 25 mg via ORAL
  Filled 2019-01-30: qty 1

## 2019-01-30 MED ORDER — OXYCODONE-ACETAMINOPHEN 5-325 MG PO TABS
1.0000 | ORAL_TABLET | Freq: Four times a day (QID) | ORAL | Status: DC | PRN
  Administered 2019-01-30: 13:00:00 1 via ORAL
  Filled 2019-01-30: qty 1

## 2019-01-30 MED ORDER — LACTATED RINGERS IV BOLUS
1000.0000 mL | Freq: Once | INTRAVENOUS | Status: AC
  Administered 2019-01-30: 1000 mL via INTRAVENOUS

## 2019-01-30 MED ORDER — BETAMETHASONE SOD PHOS & ACET 6 (3-3) MG/ML IJ SUSP
12.0000 mg | INTRAMUSCULAR | Status: DC
  Administered 2019-01-30: 12 mg via INTRAMUSCULAR

## 2019-01-30 MED ORDER — LACTATED RINGERS IV SOLN
INTRAVENOUS | Status: DC
  Administered 2019-01-30: 15:00:00 via INTRAVENOUS

## 2019-01-30 MED ORDER — BETAMETHASONE SOD PHOS & ACET 6 (3-3) MG/ML IJ SUSP: 12.0000 mg | INTRAMUSCULAR | Status: DC

## 2019-01-30 MED ORDER — ACETAMINOPHEN 325 MG PO TABS: 650.0000 mg | ORAL_TABLET | ORAL | Status: DC | PRN

## 2019-01-30 MED ORDER — DOCUSATE SODIUM 100 MG PO CAPS: 100.0000 mg | ORAL_CAPSULE | Freq: Every day | ORAL | Status: DC

## 2019-01-30 MED ORDER — CALCIUM CARBONATE ANTACID 500 MG PO CHEW: 2.0000 | CHEWABLE_TABLET | ORAL | Status: DC | PRN

## 2019-01-30 NOTE — Discharge Summary (Signed)
33yo [id number] 37+6wks with preterm contractions  Pt admitted to triage for monitoring for painful consistent contractions at 27+6wks, now resolved.  S: Pt comfortable, no contractions, sleeping, thirsty O:  Vitals:   01/30/19 1750 01/30/19 1755  BP:    Pulse:    Resp:    Temp:    SpO2: 99% 100%    SVE serial just prior to discharge:  Dilation: Closed Effacement (%): Thick Station: [employer] (from the past 2160 hour(s))  Wet prep, genital     Status: Abnormal   Collection Time: 12/14/18 12:44 PM  Result Value Ref Range   Yeast Wet Prep HPF POC NONE SEEN NONE SEEN   Trich, Wet Prep NONE SEEN NONE SEEN   Clue Cells Wet Prep HPF POC NONE SEEN NONE SEEN   WBC, Wet Prep HPF POC MODERATE (A) NONE SEEN    Comment: MANY BACTERIA SEEN   Sperm NONE SEEN     Comment: Performed at [facility], [address], [place], [place] [address]  Group B strep by PCR     Status: None   Collection Time: 01/30/19 11:04 AM  Result Value Ref Range   Group B strep by PCR NEGATIVE NEGATIVE    Comment: (NOTE) Intrapartum testing with Xpert GBS assay should be used as an adjunct to other methods available and not used to replace antepartum testing (at 35-[redacted] weeks gestation). Performed at [facility], [address] [place], [place] [address]   CBC with Differential/Platelet     Status: Abnormal   Collection Time: 01/30/19 11:18 AM  Result Value Ref Range   WBC 11.9 (H) 4.0 - 10.5 K/uL   RBC 3.96 3.87 - 5.11 MIL/uL   Hemoglobin 11.7 (L) 12.0 - 15.0 g/dL   HCT 35.7 (L) 36.0 - 46.0 %   MCV 90.2 80.0 - 100.0 fL   MCH 29.5 26.0 - 34.0 pg   MCHC 32.8 30.0 - 36.0 g/dL   RDW 14.0 11.5 - 15.5 %   Platelets 223 150 - 400 K/uL   nRBC 0.0 0.0 - 0.2 %   Neutrophils Relative % 72 %   Neutro Abs 8.6 (H) 1.7 - 7.7 K/uL   Lymphocytes Relative 19 %   Lymphs Abs 2.2 0.7 - 4.0 K/uL   Monocytes Relative 7 %   Monocytes Absolute 0.8 0.1 - 1.0 K/uL   Eosinophils  Relative 1 %   Eosinophils Absolute 0.2 0.0 - 0.5 K/uL   Basophils Relative 0 %   Basophils Absolute 0.1 0.0 - 0.1 K/uL   Immature Granulocytes 1 %   Abs Immature Granulocytes 0.13 (H) 0.00 - 0.07 K/uL    Comment: Performed at [facility], [address] [place], [place] [address]  Type and screen [place]     Status: None   Collection Time: 01/30/19 11:18 AM  Result Value Ref Range   ABO/RH(D) AB POS    Antibody Screen NEG    Sample Expiration      02/02/2019 Performed at [facility], [address] [place], [place] [address]   Wet prep, genital     Status: Abnormal   Collection Time: 01/30/19 11:56 AM  Result Value Ref Range   Yeast Wet Prep HPF POC NONE SEEN NONE SEEN    Comment: Specimen diluted due to transport tube containing more than 1 ml of saline, interpret results with caution.   Trich, Wet Prep NONE SEEN NONE SEEN  Comment: Specimen diluted due to transport tube containing more than 1 ml of saline, interpret results with caution.   Clue Cells Wet Prep HPF POC NONE SEEN NONE SEEN    Comment: Specimen diluted due to transport tube containing more than 1 ml of saline, interpret results with caution.   WBC, Wet Prep HPF POC FEW (A) NONE SEEN    Comment: Specimen diluted due to transport tube containing more than 1 ml of saline, interpret results with caution.   Sperm NONE SEEN     Comment: Specimen diluted due to transport tube containing more than 1 ml of saline, interpret results with caution. Performed at [facility], [address] [place], [place] [address]   Urinalysis, Routine w reflex microscopic     Status: Abnormal   Collection Time: 01/30/19 12:46 PM  Result Value Ref Range   Color, Urine STRAW (A) YELLOW   APPearance CLEAR (A) CLEAR   Specific Gravity, Urine 1.004 (L) 1.005 - 1.030   pH 8.0 5.0 - 8.0   Glucose, UA NEGATIVE NEGATIVE mg/dL   Hgb urine dipstick NEGATIVE NEGATIVE   Bilirubin Urine  NEGATIVE NEGATIVE   Ketones, ur NEGATIVE NEGATIVE mg/dL   Protein, ur NEGATIVE NEGATIVE mg/dL   Nitrite NEGATIVE NEGATIVE   Leukocytes,Ua NEGATIVE NEGATIVE    Comment: Performed at [facility], [address] [place], [place] [address]  Urine Drug Screen, Qualitative ([facility] only)     Status: Abnormal   Collection Time: 01/30/19 12:46 PM  Result Value Ref Range   Tricyclic, Ur Screen NONE DETECTED NONE DETECTED   Amphetamines, Ur Screen NONE DETECTED NONE DETECTED   MDMA (Ecstasy)Ur Screen NONE DETECTED NONE DETECTED   Cocaine Metabolite,Ur [place] NONE DETECTED NONE DETECTED   Opiate, Ur Screen NONE DETECTED NONE DETECTED   Phencyclidine (PCP) Ur S NONE DETECTED NONE DETECTED   Cannabinoid 50 Ng, Ur [place] POSITIVE (A) NONE DETECTED   Barbiturates, Ur Screen NONE DETECTED NONE DETECTED   Benzodiazepine, Ur Scrn NONE DETECTED NONE DETECTED   Methadone Scn, Ur NONE DETECTED NONE DETECTED    Comment: (NOTE) Tricyclics + metabolites, urine    Cutoff 1000 ng/mL Amphetamines + metabolites, urine  Cutoff 1000 ng/mL MDMA (Ecstasy), urine              Cutoff 500 ng/mL Cocaine Metabolite, urine          Cutoff 300 ng/mL Opiate + metabolites, urine        Cutoff 300 ng/mL Phencyclidine (PCP), urine         Cutoff 25 ng/mL Cannabinoid, urine                 Cutoff 50 ng/mL Barbiturates + metabolites, urine  Cutoff 200 ng/mL Benzodiazepine, urine              Cutoff 200 ng/mL Methadone, urine                   Cutoff 300 ng/mL The urine drug screen provides only a preliminary, unconfirmed analytical test result and should not be used for non-medical purposes. Clinical consideration and professional judgment should be applied to any positive drug screen result due to possible interfering substances. A more specific alternate chemical method must be used in order to obtain a confirmed analytical result. Gas chromatography / mass spectrometry (GC/MS) is the preferred confirmat ory  method. Performed at [facility], [address] [place], [place] [address]   [place] rt PCR [facility] only)  Status: None   Collection Time: 01/30/19  2:07 PM  Result Value Ref Range   Specimen source GC/Chlam ENDOCERVICAL    Chlamydia Tr NOT DETECTED NOT DETECTED   N gonorrhoeae NOT DETECTED NOT DETECTED    Comment: (NOTE) This CT/NG assay has not been evaluated in patients with a history of  hysterectomy. Performed at [facility], [address] [place], [place] [address]      A: Preterm contractions in 2nd trimester P: Contractions resolved with indomethacin and aggressive hydration. Cervix remains closed.   - Labs reassuring overall. Very minorly elevated WBC at 11 but afebrile and no uterine tenderness. No evidence of vaginal or bladder infection. - HIV labs still pending - s/p 1 dose BMZ. Pt to return for 2nd dose tomorrow.  - +canabinoid in urine  - Next appointment with her primary OB in 6 days. Encouraged to keep that appointment and call if any questions since. - precautions given re: intramniotic infection, fetal kick counts. - no bed rest recommended (but she asked)

## 2019-01-30 NOTE — H&P (Signed)
ANTEPARTUM ADMISSION HISTORY AND PHYSICAL NOTE   History of Present Illness: Amber Key is a 33 y.o. [id number] at [redacted]w[redacted]d presenting for preterm contractions - painful contractions started at 0630 this morning, q5-6 min.  - Last intercourse at midnight, no contractions, bleeding or pain at that time - Prior NSVD x1, spontaneous labor at term, no complications - no symptoms of vaginal infection, UTI or chorio - very active fetal movement  Patient reports the fetal movement as active. Patient reports vaginal bleeding as none. Patient describes fluid per vagina as None. Fetal presentation is cephalic.  Patient Active Problem List   Diagnosis Date Noted  . Preterm uterine contractions in second trimester, antepartum 01/30/2019  . Abnormal mammogram 06/26/2014  . Adenopathy 02/11/2014  . Human immunodeficiency virus (HIV) infection ([place]) 02/10/2014  . Headache, migraine 12/13/2013  . Benign intracranial hypertension 12/13/2013    Past Medical History:  Diagnosis Date  . Chlamydia   . Enlarged lymph nodes unk  . HIV (human immunodeficiency virus infection) ([place])    diagnosed years ago at [facility]  . Lymphoma ([place])   . Memory loss   . Migraines   . Pseudotumor cerebri unk  . Vision loss     Past Surgical History:  Procedure Laterality Date  . COLONOSCOPY    . DILATION AND CURETTAGE OF UTERUS    . LUMBAR PUNCTURE    . NO PAST SURGERIES    . UPPER GI ENDOSCOPY    . wisdom teeth      OB History  Gravida Para Term Preterm AB Living  11 1 1   9 1   SAB TAB Ectopic Multiple Live Births  9   0   1    # Outcome Date GA Lbr Len/2nd Weight Sex Delivery Anes PTL Lv  11 Current           10 Term 2007   2336 g F Vag-Spont   LIV  9 SAB           8 SAB           7 SAB           6 SAB           5 SAB           4 SAB           3 SAB           2 SAB           1 SAB             Social History   Socioeconomic History  . Marital status: Married    Spouse name: Elta Guadeloupe  . Number of  children: Not on file  . Years of education: Not on file  . Highest education level: Not on file  Occupational History  . Not on file  Social Needs  . Financial resource strain: Not on file  . Food insecurity:    Worry: Not on file    Inability: Not on file  . Transportation needs:    Medical: Not on file    Non-medical: Not on file  Tobacco Use  . Smoking status: Former Smoker    Types: Cigarettes  . Smokeless tobacco: Never Used  . Tobacco comment: quit Sept 2019  Substance and Sexual Activity  . Alcohol use: Not Currently    Comment: not with preg  . Drug use: Not Currently    Types: Marijuana    Comment: not  with preg  . Sexual activity: Yes    Birth control/protection: None  Lifestyle  . Physical activity:    Days per week: Not on file    Minutes per session: Not on file  . Stress: Not on file  Relationships  . Social connections:    Talks on phone: Not on file    Gets together: Not on file    Attends religious service: Not on file    Active member of club or organization: Not on file    Attends meetings of clubs or organizations: Not on file    Relationship status: Not on file  Other Topics Concern  . Not on file  Social History Narrative  . Not on file    Family History  Problem Relation Age of Onset  . Ovarian cancer Mother   . Breast cancer Mother   . Diabetes Mother   . Hypertension Mother   . Ovarian cancer Maternal Aunt   . Diabetes Maternal Aunt   . Hypertension Maternal Aunt   . Ovarian cancer Maternal Grandmother   . Breast cancer Maternal Grandmother   . Colon cancer Neg Hx     Allergies  Allergen Reactions  . Peanut-Containing Drug Products Hives    Medications Prior to Admission  Medication Sig Dispense Refill Last Dose  . acetaminophen (TYLENOL) 500 MG tablet Take 1,000 mg by mouth every 6 (six) hours as needed.   01/29/2019 at Unknown time  . acetaZOLAMIDE (DIAMOX) 500 MG capsule Take 500 mg by mouth 2 (two) times daily.    01/29/2019 at Unknown time  . bictegravir-emtricitabine-tenofovir AF (BIKTARVY) 50-200-25 MG TABS tablet Take by mouth daily.   01/29/2019 at Unknown time  . diphenhydrAMINE (BENADRYL) 25 mg capsule Take 25 mg by mouth every 6 (six) hours as needed.   01/29/2019 at Unknown time  . Prenatal Vit-Fe Fumarate-FA (PRENATAL MULTIVITAMIN) TABS tablet Take 1 tablet by mouth daily at 12 noon.   01/29/2019 at Unknown time  . promethazine (PHENERGAN) 25 MG tablet Take 25 mg by mouth every 6 (six) hours as needed for nausea or vomiting.   01/29/2019 at Unknown time  . SUMAtriptan (IMITREX) 50 MG tablet Take 50 mg by mouth every 2 (two) hours as needed for migraine. May repeat in 2 hours if headache persists or recurs.   01/29/2019 at Unknown time    Review of Systems - Negative except as in HPI  Vitals:  BP 119/75 (BP Location: Left Arm)   Pulse (!) 109   Temp (!) 97.5 F (36.4 C) (Oral)   Resp 18   LMP 07/14/2018 (Exact Date)   SpO2 100%  Physical Examination:  General appearance: alert, cooperative and no distress Abdomen: soft, non-tender; bowel sounds normal; no masses, no organomegaly, gravid Pelvic: cervix normal in appearance, external genitalia normal, no adnexal masses or tenderness, no bladder tenderness, no cervical motion tenderness, rectovaginal septum normal, urethra without abnormality  Extremities: extremities normal, atraumatic, no cyanosis or edema  Membranes:intact Fetal Monitoring:Baseline: 150 bpm, Variability: Fair (1-6 bpm), Accelerations: Non-reactive but appropriate for gestational age and Decelerations: Variable: appropriate for gestational age Tocometer: Flat  SVE: closed/thick and high, no cervical motion tenderness SSE: No HSV lesions, no pooling, no blood in vaginal vault Bedside ultrasound:  - presentation: cephalic, fetal back to maternal left - placenta: posterior - incidental BPP 8/8 with active fetal breathing throughout the scan - AFI: 18.5cm and subjectively  normal  Labs:  No results found for this or any previous visit (from  the past 24 hour(s)).  Imaging Studies: [place] Mfm Ob Follow Up  Result Date: 01/28/2019 ----------------------------------------------------------------------  OBSTETRICS REPORT                       (Signed Final 01/28/2019 02:25 pm) ---------------------------------------------------------------------- PATIENT INFO:  ID #:       [id number]                          D.O.B.:  04-10-1986 (32 yrs)  Name:       Alvester Morin                    Visit Date: 01/28/2019 02:17 pm ---------------------------------------------------------------------- PERFORMED BY:  Performed By:     Rosalia Hammers          Ref. Address:     [address] [place]                                                             [address],                                                             [place] [address]  Referred By:      Harlin Heys MD ---------------------------------------------------------------------- SERVICE(S) PROVIDED:   [place] MFM OB FOLLOW UP                                  [phone]  ---------------------------------------------------------------------- INDICATIONS:   [redacted] weeks gestation of pregnancy                Z3A.27  ---------------------------------------------------------------------- FETAL EVALUATION:  Num Of Fetuses:         1  Fetal Heart Rate(bpm):  136  Cardiac Activity:       Present  Presentation:           Cephalic  Placenta:               Posterior  Amniotic Fluid  AFI FV:      Subjectively increased  AFI Sum(cm)     %Tile       Largest Pocket(cm)  22.19           92          7.16  RUQ(cm)       RLQ(cm)       LUQ(cm)        LLQ(cm)  6.73          3.67          7.16           4.63 ---------------------------------------------------------------------- BIOMETRY:  BPD:      64.8  mm     G. Age:  26w 2d          6  %    CI:         73.53   %    70 - 86                                                          FL/HC:      21.4   %    18.8 - 20.6  HC:      240.1  mm     G. Age:  26w 1d        < 3  %    HC/AC:      1.09        1.05 - 1.21  AC:      219.4  mm     G. Age:  26w 3d         13  %    FL/BPD:     79.2   %    71 - 87  FL:       51.3  mm     G. Age:  27w 3d         32  %    FL/AC:      23.4   %    20 - 24  HUM:      46.5  mm     G. Age:  27w 3d         2  %  Est. FW:     974  gm      2 lb 2 oz     15  % ---------------------------------------------------------------------- GESTATIONAL AGE:  LMP:           28w 2d        Date:  07/14/18                 EDD:   04/20/19  U/S Today:     26w 4d                                        EDD:   05/02/19  Best:          27w 4d     Det. ByLoman Chroman         EDD:   04/25/19                                      (09/11/18) ---------------------------------------------------------------------- ANATOMY:  Cavum:                 Within Normal Limits   Aortic Arch:            Normal appearance  Ventricles:            Normal appearance      Ductal Arch:  Normal appearance  Posterior Fossa:       Within Normal Limits   Diaphragm:              Within Normal Limits  Face:                  Within Normal Limits   Stomach:                Seen  Heart:                 4-Chamber view         Kidneys:                Normal appearance                         appears normal  RVOT:                  Normal appearance      Bladder:                Seen  LVOT:                  Normal appearance      Spine:                  Normal appearance ---------------------------------------------------------------------- CERVIX UTERUS ADNEXA:  Cervix  Length:            3.7  cm. ---------------------------------------------------------------------- IMPRESSION:  ,  Thank you for referring your patient  for a fetal growth  evaluation due to recurrent pregnancy loss and medicaiton  exposure.  There is a singleton  gestation at 27 weeks 4 days with normal  amniotic fluid volume.  Dating is by earliest availabe  ultrasound on 10/09/18; measurements were consistent with  7w 5 d.  The fetal biometry correlates with established dating.  Adequate interval growth noted.  The EFW is at the 15hth  percentile for gestational age.  Recommend follow-up scan for fetal growth as clinically  indicated.  Thank you for allowing [place] to participate in your patient's care.  Please do not hesitate to contact us if we can be of further  assistance. ----------------------------------------------------------------------                   Manfred Shirts, MD Electronically Signed Final Report   01/28/2019 02:25 pm ----------------------------------------------------------------------    Assessment and Plan:  44WN [id number] at 27+9wks presenting with 6 hours of painful consistent preterm contractions, without cervical dilation.  1. Preterm contractions - wet prep, GC/CT and UA pending. No evidence of intrauterine infection on exam - CBC pending - no evidence of PPROM on exam - FFN not collected due to sex overnight - BMZ started now - will start mag for neuroprotection if any cervical change - start indomethacin with po loading and maintenance now. - percocet for pain control if needed  2. Fetal status: - reassuring BPP, though NST not reactive which is appropriate for gestational age. - AFI wnl by bedside scan today - last growth scan with MFM 2 days ago with EFW 15%  3. HIV: - Last CD4 count wnl and viral load undetectable in 10/2018. Continue home meds while in house. - current CD4 and VL pending. If low, may deliver vaginally - if progresses in labor, will initiate AZT per protocol.   4. Hx of HSV positive bloodwork, but pt denies genital outbreaks - speculum exam unremarkable. Vaginal  delivery would be reasonable  5. Will recheck presentation if she does progress in preterm labor to determine route of delivery. Otherwise,  routine antenatal care  6. Plan for observation with regular diet at this time  Does have appointment this week with primary OB. If still inhouse, plan for 28wk labs next week.  Patient Active Problem List   Diagnosis Date Noted  . Preterm uterine contractions in second trimester, antepartum 01/30/2019  . Abnormal mammogram 06/26/2014  . Adenopathy 02/11/2014  . Human immunodeficiency virus (HIV) infection ([place]) 02/10/2014  . Headache, migraine 12/13/2013  . Benign intracranial hypertension 12/13/2013     Betamethasone x 2 doses Magnesium sulfate for CP prophylaxis on hold unless any change in clinical status suggesting preterm cervical dilation

## 2019-01-30 NOTE — Unmapped (Signed)
Return VM call. Patient states she has been  Contracting since 6.50 this morning, now 5 minutes apart lasting 1 minute. Denies leaking of fluid. States fetal movement. Advised patient to go to nearest hospital ([facility] [facility]) to be assessed. Patient verbalizes understanding. [facility] charge nurseinformed

## 2019-01-30 NOTE — Unmapped (Signed)
16:07    Called patient for scheduled HIV f/u visit.     Woke this am not feeling well, started having contractions. Called OB, note in chart directing patient to [facility] where she is now. Contractions have not abated, told she may go to [facility] if they can't be stopped. She is doing ok, understandably worried. She will be 28w tomorrow.    Offered support and will reschedule phone visit.    [name], MD, MPH   [facility]   [address] 1st floor   [place], [place]. [address]   Phone: [phone]   Fax: [phone]

## 2019-01-31 ENCOUNTER — Encounter: Admit: 2019-01-31 | Discharge: 2019-01-31 | Payer: BLUE CROSS/BLUE SHIELD

## 2019-01-31 LAB — URINE CULTURE

## 2019-01-31 LAB — HELPER T-LYMPH-CD4 (ARMC ONLY)
% CD 4 Pos. Lymph.: 13.2 % — ABNORMAL LOW (ref 30.8–58.5)
Absolute CD 4 Helper: 290 /uL — ABNORMAL LOW (ref 359–1519)
Basophils Absolute: 0 10*3/uL (ref 0.0–0.2)
Basos: 0 %
EOS (ABSOLUTE): 0.2 10*3/uL (ref 0.0–0.4)
Eos: 2 %
Hematocrit: 34.4 % (ref 34.0–46.6)
Hemoglobin: 11.6 g/dL (ref 11.1–15.9)
Immature Grans (Abs): 0.1 10*3/uL (ref 0.0–0.1)
Immature Granulocytes: 1 %
Lymphocytes Absolute: 2.2 10*3/uL (ref 0.7–3.1)
Lymphs: 18 %
MCH: 29.2 pg (ref 26.6–33.0)
MCHC: 33.7 g/dL (ref 31.5–35.7)
MCV: 87 fL (ref 79–97)
Monocytes Absolute: 0.8 10*3/uL (ref 0.1–0.9)
Monocytes: 7 %
Neutrophils Absolute: 8.8 10*3/uL — ABNORMAL HIGH (ref 1.4–7.0)
Neutrophils: 72 %
Platelets: 225 10*3/uL (ref 150–450)
RBC: 3.97 x10E6/uL (ref 3.77–5.28)
RDW: 13.7 % (ref 11.7–15.4)
WBC: 12.2 10*3/uL — ABNORMAL HIGH (ref 3.4–10.8)

## 2019-01-31 LAB — RPR: RPR Ser Ql: NONREACTIVE

## 2019-01-31 LAB — HEPATITIS C ANTIBODY: HCV Ab: <0.1 s/co ratio (ref 0.0–0.9)

## 2019-01-31 LAB — HEPATITIS B SURFACE ANTIGEN: Hepatitis B Surface Ag: NEGATIVE

## 2019-01-31 LAB — HIV-1 RNA QUANT-NO REFLEX-BLD
HIV 1 RNA Quant: 80 copies/mL
LOG10 HIV-1 RNA: 1.903 log10copy/mL

## 2019-02-05 ENCOUNTER — Encounter
Admit: 2019-02-05 | Discharge: 2019-02-06 | Payer: BLUE CROSS/BLUE SHIELD | Attending: Maternal & Fetal Medicine | Primary: Maternal & Fetal Medicine

## 2019-02-05 ENCOUNTER — Institutional Professional Consult (permissible substitution): Admit: 2019-02-05 | Discharge: 2019-02-06 | Payer: BLUE CROSS/BLUE SHIELD

## 2019-02-05 DIAGNOSIS — B20 Human immunodeficiency virus [HIV] disease: Secondary | ICD-10-CM

## 2019-02-05 DIAGNOSIS — O0990 Supervision of high risk pregnancy, unspecified, unspecified trimester: Principal | ICD-10-CM

## 2019-02-05 DIAGNOSIS — G932 Benign intracranial hypertension: Principal | ICD-10-CM

## 2019-02-05 LAB — CBC
HEMATOCRIT: 33.7 % — ABNORMAL LOW (ref 36.0–46.0)
MEAN CORPUSCULAR HEMOGLOBIN CONC: 33 g/dL (ref 31.0–37.0)
MEAN CORPUSCULAR HEMOGLOBIN: 29.5 pg (ref 26.0–34.0)
MEAN CORPUSCULAR VOLUME: 89.4 fL (ref 80.0–100.0)
MEAN PLATELET VOLUME: 10.3 fL — ABNORMAL HIGH (ref 7.0–10.0)
PLATELET COUNT: 241 10*9/L (ref 150–440)
RED CELL DISTRIBUTION WIDTH: 14.6 % (ref 12.0–15.0)
RED CELL DISTRIBUTION WIDTH: 14.6 % — ABNORMAL LOW (ref 12.0–15.0)
WBC ADJUSTED: 12.7 10*9/L — ABNORMAL HIGH (ref 4.5–11.0)

## 2019-02-05 NOTE — Unmapped (Signed)
Today had TDAP and third trimester labs this AM  We dicussed glucola pending, but higher risk of GDM given recent BMZ one week back for preterm contractions at [facility] triage and on longterm ART, both of which can increase the risk of glucose intolerance in pregnancy.  Will plan for OB US in 4 weeks with visit at that time.

## 2019-02-05 NOTE — Unmapped (Signed)
She was evaluated by Neurology on 4/15 at which time her diamox was increased to 500 BID, she is still not noticing any difference in her symptoms, but is open to further up titration on follow-up  Her headaches are likely mutifactorial given strong hx of migraines, still takes tylenol and promethazine, and off triptans and zomig in pregnancy

## 2019-02-05 NOTE — Unmapped (Signed)
.    Provider: Alvia Grove  Division: Maternal-Fetal Medicine    Virtual Video Encounter  Return Prenatal Note    This encounter was conducted from [facility] via [employer] with the patient. [name] reports she is located at home, state:  during the visit encounter.     I spent 20 minutes on the audio/video with the patient. I spent an additional 10 minutes on pre- and post-visit activities.     The patient was physically located in [place] or a state in which I am permitted to provide care. The patient understood that s/he may incur co-pays and cost sharing, and agreed to the telemedicine visit. The visit was completed via phone and/or video, which was appropriate and reasonable under the circumstances given the patient's presentation at the time.    The patient has been advised of the potential risks and limitations of this mode of treatment (including, but not limited to, the absence of in-person examination) and has agreed to be treated using telemedicine. The patient's/patient's family's questions regarding telemedicine have been answered.     If the phone/video visit was completed in an ambulatory setting, the patient has also been advised to contact their provider???s office for worsening conditions, and seek emergency medical treatment and/or call 911 if the patient deems either necessary.         Assessment/Plan:     Plan   33 y.o. [id number] at [redacted]w[redacted]d presents for a follow-up OB visit for high risk pregnancy.   Pregnancy notable for:  HIV on HAART with undetectable PVL  Pseudotumor cerebri on diamox  Chronic migraines on promethazine  Class II obesity    Pseudotumor cerebri  She was evaluated by Neurology on 4/15 at which time her diamox was increased to 500 BID, she is still not noticing any difference in her symptoms, but is open to further up titration on follow-up  Her headaches are likely mutifactorial given strong hx of migraines, still takes tylenol and promethazine, and off triptans and zomig in pregnancy      Supervision of high risk pregnancy, unspecified, unspecified trimester  Today had TDAP and third trimester labs this AM  We dicussed glucola pending, but higher risk of GDM given recent BMZ one week back for preterm contractions at [facility] triage and on longterm ART, both of which can increase the risk of glucose intolerance in pregnancy.  Will plan for OB US in 4 weeks with visit at that time.      HIV  Continue on TDF based HAART with 100% adherence  CD4 and PVL undetectable earlier last month  Will plan to check again in 4 weeks at 32 weeks      Follow up: No follow-ups on file.   Follow-up requests from provider: in person due to appropriate care for medical conditions  Patient will call the clinic or use MyChart should anything change or any new issues arise.      Subjective     32 y.o. [id number] at [redacted]w[redacted]d presents for this Virtual Video follow-up OB visit for high risk pregnancy. Patient reports normal fetal movement and denies contractions, leakage of fluid, or vaginal bleeding.       Objective:   (Physical Examination (to extent possible); Laboratory and Test Data (as available))    The following portions of the patient's history were reviewed and updated as appropriate: allergies, current medications, past obstetric history, past family history, past medical history, past social history, past surgical history and problem list  Medical Decision Making:     25 minutes was spent on this medically necessary service. Pt visit by Virtual Video in the setting of State of Emergency due to COVID-19 Pandemic.     Alvia Grove, MD    Dr. Germain Osgood was immediately available.    Susa Loffler, MD, PhD  Maternal Fetal Medicine Fellow  Division of Maternal Fetal Medicine  Department of Obstetrics & Gynecology  [facility], [place]

## 2019-02-05 NOTE — Unmapped (Signed)
Patient was seen at the office for BP check, 1 HR GLUCOLA, and TDAP VACCINE.    Morgan Wandrey Q.    CMA

## 2019-02-05 NOTE — Unmapped (Signed)
Continue on TDF based HAART with 100% adherence  CD4 and PVL undetectable earlier last month  Will plan to check again in 4 weeks at 32 weeks

## 2019-02-06 NOTE — Unmapped (Signed)
Reviewed patient's results today for CBC and 1 hr GTT. 1 hr GTT today elevated to 250 in the setting of receipt of steroids for threatened preterm labor on 4/22 and 4/23. As she is only 4 days out from  betamethasone, this may have falsely elevated her results from 1 hr GTT; however, results are very elevated with 1 hr GTT of 250.     I counseled the patient regarding options including (1) repeating 1 hr gtt in 7-10 days (2) going straight to 3 hr GTT in 7-10 days so as to avoid coming in again if 1 hr is positive or (3) remote monitoring of blood sugars four times daily at home w/ glucometer.     The patient has opted for repeating 1 hr GTT in 7-10 days. I have sent a message to [facility] scheduling for RN visit in 7-10 days for repeat 1 hr GTT.     Jacklynn Ganong MD

## 2019-02-06 NOTE — Unmapped (Addendum)
Left message for patient to schedule 1 hr gtt on 5/8 per inbasket message.    ----- Message from Janeece Riggers sent at 02/06/2019  1:13 PM EDT -----  Regarding: FW: RN visit for patient on May 8th    ----- Message -----  From: Colin Benton, MD  Sent: 02/05/2019   5:48 PM EDT  To: , #  Subject: RN visit for patient on May 8th                  Hello,     Can we schedule an RN visit for patient for 1 hr gtt?     Thank you,     Renard Hamper Talati

## 2019-02-06 NOTE — Unmapped (Signed)
needs to Schedule OB US in 3-4 weeks, with MFM visit at same time [facility]

## 2019-02-07 ENCOUNTER — Other Ambulatory Visit: Payer: Self-pay

## 2019-02-07 ENCOUNTER — Observation Stay: Payer: 59

## 2019-02-07 ENCOUNTER — Observation Stay
Admission: EM | Admit: 2019-02-07 | Discharge: 2019-02-07 | Disposition: A | Discharge status: Home / Self Care | Payer: 59 | Attending: Certified Nurse Midwife | Admitting: Certified Nurse Midwife

## 2019-02-07 DIAGNOSIS — O26873 Cervical shortening, third trimester: Secondary | ICD-10-CM

## 2019-02-07 DIAGNOSIS — Z3A29 29 weeks gestation of pregnancy: Secondary | ICD-10-CM | POA: Diagnosis not present

## 2019-02-07 DIAGNOSIS — Z87891 Personal history of nicotine dependence: Secondary | ICD-10-CM | POA: Insufficient documentation

## 2019-02-07 DIAGNOSIS — Z79899 Other long term (current) drug therapy: Secondary | ICD-10-CM | POA: Insufficient documentation

## 2019-02-07 DIAGNOSIS — B20 Human immunodeficiency virus [HIV] disease: Secondary | ICD-10-CM | POA: Diagnosis not present

## 2019-02-07 DIAGNOSIS — O4702 False labor before 37 completed weeks of gestation, second trimester: Secondary | ICD-10-CM

## 2019-02-07 DIAGNOSIS — O98713 Human immunodeficiency virus [HIV] disease complicating pregnancy, third trimester: Secondary | ICD-10-CM | POA: Diagnosis not present

## 2019-02-07 DIAGNOSIS — O4703 False labor before 37 completed weeks of gestation, third trimester: Secondary | ICD-10-CM | POA: Diagnosis present

## 2019-02-07 LAB — URINALYSIS, ROUTINE W REFLEX MICROSCOPIC
Bilirubin Urine: NEGATIVE
Glucose, UA: NEGATIVE mg/dL
Hgb urine dipstick: NEGATIVE
Ketones, ur: NEGATIVE mg/dL
Leukocytes,Ua: NEGATIVE
Nitrite: NEGATIVE
Protein, ur: NEGATIVE mg/dL
Specific Gravity, Urine: 1.01 (ref 1.005–1.030)
pH: 7 (ref 5.0–8.0)

## 2019-02-07 MED ORDER — TERBUTALINE SULFATE 1 MG/ML IJ SOLN
0.2500 mg | Freq: Once | INTRAMUSCULAR | Status: AC
  Administered 2019-02-07: 0.25 mg via SUBCUTANEOUS

## 2019-02-07 MED ORDER — TERBUTALINE SULFATE 1 MG/ML IJ SOLN
INTRAMUSCULAR | Status: AC
  Filled 2019-02-07: qty 1

## 2019-02-07 MED ORDER — LACTATED RINGERS IV BOLUS
1000.0000 mL | Freq: Once | INTRAVENOUS | Status: AC
  Administered 2019-02-07: 1000 mL via INTRAVENOUS

## 2019-02-07 MED ORDER — ACETAMINOPHEN 500 MG PO TABS
1000.0000 mg | ORAL_TABLET | Freq: Four times a day (QID) | ORAL | Status: DC | PRN
  Administered 2019-02-07: 1000 mg via ORAL
  Filled 2019-02-07: qty 2

## 2019-02-07 NOTE — OB Triage Note (Addendum)
Pt is a 33yo G11P1 at [redacted]w[redacted]d that presents from home with c/o ctx. Pt states ctx have been ongoing for a week and a half. Pt states ctx pain is 8/10 and has not taken any medication to relieve pain.  Pt is a [facility] patient. Last intercourse 01/29/19. Pt denies VB, LOF and states positive FM. Pt was seen here on 01/30/19 and received first BMZ injection and sent home. Pt didn't show up the following morning for her BMZ and when called stated she "started contracting again and was scared to come in." Pt then was informed to seek care and went to [facility] where she received her second BMZ and was sent home. Monitors applied and initial FHT 140.

## 2019-02-07 NOTE — Progress Notes (Signed)
Amber Key is a 33 y.o. female. She is at [redacted]w[redacted]d gestation. Patient's last menstrual period was 07/14/2018 (exact date). Estimated Date of Delivery: 04/25/19  Prenatal care site: [facility]  Chief complaint: contractions Location: lower abdomen Onset/timing: about a week and a half ago, worsened since 0300 today Duration: intermittent Quality: cramping Severity: moderate to severe Aggravating or alleviating conditions: none Associated signs/symptoms: none Context: Amber Key was first seen in L&D triage here 8 days ago for contractions. She was seen the next day at [facility] for contractions. She reports that the contractions have been ongoing since that time, but most of the time are mild in intensity and she can sleep through them. She states she was woken up from sleep early this morning and unable to sleep because the cramping was more intense. She rates her contractions this morning as 8/10 pain. She has not taken any medication to relieve pain. Last intercourse 01/29/2019. She reports adequate hydration with at least 60 ounces of water daily.   S: Resting comfortably.   She reports:  -active fetal movement -no leakage of fluid  -no vaginal bleeding  Maternal Medical History:   Past Medical History:  Diagnosis Date  . Chlamydia   . Enlarged lymph nodes unk  . HIV (human immunodeficiency virus infection) ([place])    diagnosed years ago at [facility]  . Lymphoma ([place])   . Memory loss   . Migraines   . Pseudotumor cerebri unk  . Vision loss     Past Surgical History:  Procedure Laterality Date  . COLONOSCOPY    . DILATION AND CURETTAGE OF UTERUS    . LUMBAR PUNCTURE    . NO PAST SURGERIES    . UPPER GI ENDOSCOPY    . wisdom teeth      Allergies  Allergen Reactions  . Peanut-Containing Drug Products Hives    Prior to Admission medications   Medication Sig Start Date End Date Taking? Authorizing Provider  acetaminophen (TYLENOL) 500 MG tablet Take 1,000 mg by mouth every 6 (six) hours as  needed.   Yes [provider]  acetaZOLAMIDE (DIAMOX) 500 MG capsule Take 500 mg by mouth 2 (two) times daily.   Yes [provider]  bictegravir-emtricitabine-tenofovir AF (BIKTARVY) 50-200-25 MG TABS tablet Take by mouth daily.   Yes [provider]  diphenhydrAMINE (BENADRYL) 25 mg capsule Take 25 mg by mouth every 6 (six) hours as needed.   Yes [provider]  Prenatal Vit-Fe Fumarate-FA (PRENATAL MULTIVITAMIN) TABS tablet Take 1 tablet by mouth daily at 12 noon.   Yes [provider]  promethazine (PHENERGAN) 25 MG tablet Take 25 mg by mouth every 6 (six) hours as needed for nausea or vomiting.   Yes [provider]  SUMAtriptan (IMITREX) 50 MG tablet Take 50 mg by mouth every 2 (two) hours as needed for migraine. May repeat in 2 hours if headache persists or recurs.   Yes [provider]     Social History: She  reports that she has quit smoking. Her smoking use included cigarettes. She has never used smokeless tobacco. She reports previous alcohol use. She reports previous drug use. Drug: Marijuana.  Family History: family history includes Breast cancer in her maternal grandmother and mother; Diabetes in her maternal aunt and mother; Hypertension in her maternal aunt and mother; Ovarian cancer in her maternal aunt, maternal grandmother, and mother.  Review of Systems: A full review of systems was performed and negative except as noted in the HPI.  O:  BP 126/80 (BP Location: Right Arm)   Pulse (!) 109   Temp 98.2 F (36.8 C) (Oral)   Resp 16   Ht 4\' 11"  (1.499 m)   Wt 81.2 kg   LMP 07/14/2018 (Exact Date)   BMI 36.15 kg/m  Results for orders placed or performed during the hospital encounter of 02/07/19 (from the past 48 hour(s))  Urinalysis, Routine w reflex microscopic   Collection Time: 02/07/19  8:39 AM  Result Value Ref Range   Color, Urine STRAW (A) YELLOW   APPearance CLEAR (A) CLEAR   Specific Gravity,  Urine 1.010 1.005 - 1.030   pH 7.0 5.0 - 8.0   Glucose, UA NEGATIVE NEGATIVE mg/dL   Hgb urine dipstick NEGATIVE NEGATIVE   Bilirubin Urine NEGATIVE NEGATIVE   Ketones, ur NEGATIVE NEGATIVE mg/dL   Protein, ur NEGATIVE NEGATIVE mg/dL   Nitrite NEGATIVE NEGATIVE   Leukocytes,Ua NEGATIVE NEGATIVE     Constitutional: NAD, AAOx3, appears comfortable, talking through contractions HE/ENT: extraocular movements grossly intact, moist mucous membranes CV: RRR PULM: normal respiratory effort, CTABL  Back: symmetric, no CVAT    Abd: gravid, non-tender, non-distended, soft      Ext: Non-tender, Nonedmeatous   Psych: mood appropriate, speech normal Pelvic: cervix closed/long/posterior, fetal presenting part not engaged in pelvis  NST:  Baseline: 145bpm Variability: moderate Accelerations: 10x10 present x >2 Decelerations: absent Time: 44mins Toco: contractions q5-6 minutes on arrival, occasional with uterine irritability after terbutaline    A/P: 33 y.o. [redacted]w[redacted]d here for antenatal surveillance during pregnancy.  Principle diagnosis: preterm uterine contractions without laobr  Preterm contractions  S/p 1 dose of terbutaline and hydration with 1L LR IV   Plan to obtain transvaginal cervical length  Labor  Not present  Cervix closed at this time  Fetal Wellbeing  Reactive NST, reassuring for GA   Discussed with Dr. Leafy Ro, who is in agreement with plan to check cervical length, then likely discharge home if contractions are still rare.   Lisette Grinder 02/07/2019 9:00 AM  ----- Lisette Grinder, CNM Certified Nurse Midwife [facility], Department of [facility]

## 2019-02-07 NOTE — Discharge Summary (Addendum)
Amber Key is a 33 y.o. female. She is at [redacted]w[redacted]d gestation. Patient's last menstrual period was 07/14/2018 (exact date). Estimated Date of Delivery: 04/25/19  Prenatal care site: [facility]  Chief complaint: contractions Location: lower abdomen Onset/timing: about a week and a half ago, worsened since 0300 today Duration: intermittent Quality: cramping Severity: moderate to severe Aggravating or alleviating conditions: none Associated signs/symptoms: none Context: Amber Key was first seen in L&D triage here 8 days ago for contractions. She was seen the next day at [facility] for contractions. She reports that the contractions have been ongoing since that time, but most of the time are mild in intensity and she can sleep through them. She states she was woken up from sleep early this morning and unable to sleep because the cramping was more intense. She rates her contractions this morning as 8/10 pain. She has not taken any medication to relieve pain. Last intercourse 01/29/2019. She reports adequate hydration with at least 60 ounces of water daily.   S: Resting comfortably.   She reports:  -active fetal movement -no leakage of fluid  -no vaginal bleeding  Maternal Medical History:   Past Medical History:  Diagnosis Date  . Chlamydia   . Enlarged lymph nodes unk  . HIV (human immunodeficiency virus infection) ([place])    diagnosed years ago at [facility]  . Lymphoma ([place])   . Memory loss   . Migraines   . Pseudotumor cerebri unk  . Vision loss     Past Surgical History:  Procedure Laterality Date  . COLONOSCOPY    . DILATION AND CURETTAGE OF UTERUS    . LUMBAR PUNCTURE    . NO PAST SURGERIES    . UPPER GI ENDOSCOPY    . wisdom teeth      Allergies  Allergen Reactions  . Peanut-Containing Drug Products Hives    Prior to Admission medications   Medication Sig Start Date End Date Taking? Authorizing Provider  acetaminophen (TYLENOL) 500 MG tablet Take 1,000 mg by mouth every 6 (six) hours as  needed.   Yes [provider]  acetaZOLAMIDE (DIAMOX) 500 MG capsule Take 500 mg by mouth 2 (two) times daily.   Yes [provider]  bictegravir-emtricitabine-tenofovir AF (BIKTARVY) 50-200-25 MG TABS tablet Take by mouth daily.   Yes [provider]  diphenhydrAMINE (BENADRYL) 25 mg capsule Take 25 mg by mouth every 6 (six) hours as needed.   Yes [provider]  Prenatal Vit-Fe Fumarate-FA (PRENATAL MULTIVITAMIN) TABS tablet Take 1 tablet by mouth daily at 12 noon.   Yes [provider]  promethazine (PHENERGAN) 25 MG tablet Take 25 mg by mouth every 6 (six) hours as needed for nausea or vomiting.   Yes [provider]  SUMAtriptan (IMITREX) 50 MG tablet Take 50 mg by mouth every 2 (two) hours as needed for migraine. May repeat in 2 hours if headache persists or recurs.   Yes [provider]     Social History: She  reports that she has quit smoking. Her smoking use included cigarettes. She has never used smokeless tobacco. She reports previous alcohol use. She reports previous drug use. Drug: Marijuana.  Family History: family history includes Breast cancer in her maternal grandmother and mother; Diabetes in her maternal aunt and mother; Hypertension in her maternal aunt and mother; Ovarian cancer in her maternal aunt, maternal grandmother, and mother.  Review of Systems: A full review of systems was performed and negative except as noted in the HPI.  O:  BP (!) 89/62 (BP Location: Left Arm)   Pulse (!) 103   Temp 98.1 F (36.7 C) (Oral)   Resp 18   Ht 4\' 11"  (1.499 m)   Wt 81.2 kg   LMP 07/14/2018 (Exact Date)   BMI 36.15 kg/m  Results for orders placed or performed during the hospital encounter of 02/07/19 (from the past 48 hour(s))  Urinalysis, Routine w reflex microscopic   Collection Time: 02/07/19  8:39 AM  Result Value Ref Range   Color, Urine STRAW (A) YELLOW   APPearance CLEAR (A) CLEAR   Specific  Gravity, Urine 1.010 1.005 - 1.030   pH 7.0 5.0 - 8.0   Glucose, UA NEGATIVE NEGATIVE mg/dL   Hgb urine dipstick NEGATIVE NEGATIVE   Bilirubin Urine NEGATIVE NEGATIVE   Ketones, ur NEGATIVE NEGATIVE mg/dL   Protein, ur NEGATIVE NEGATIVE mg/dL   Nitrite NEGATIVE NEGATIVE   Leukocytes,Ua NEGATIVE NEGATIVE     Ultrasound:  -SIUP -BPD=[redacted]w[redacted]d, CWD [redacted]w[redacted]d by established dating -FHT 828MKL -cephalic -placenta posterior, no previa -AFI 15.1cm, WNL -There is funneling of the proximal cervis. The amniotic membranes extend into the funneled portion of the cervix. More distally, the cervix is primarily closed over a 3.9 cm distance. There is a small amount of fluid extending through the closed portion of the cervix. No increase in funneling with application of pressure.   Constitutional: NAD, AAOx3, appears comfortable, talking through contractions HE/ENT: extraocular movements grossly intact, moist mucous membranes CV: RRR PULM: normal respiratory effort, CTABL  Back: symmetric, no CVAT    Abd: gravid, non-tender, non-distended, soft      Ext: Non-tender, Nonedmeatous   Psych: mood appropriate, speech normal Pelvic: cervix closed/long/posterior, fetal presenting part not engaged in pelvis  NST:  Baseline: 145bpm Variability: moderate Accelerations: 10x10 present x >2 Decelerations: absent Time: 76mins Toco: contractions q5-6 minutes on arrival, rare with uterine irritability since terbutaline administration    A/P: 33 y.o. [redacted]w[redacted]d here for antenatal surveillance during pregnancy.  Principle diagnosis: preterm uterine contractions without cervical dilation  Preterm contractions  Contractions stopped s/p 1 dose of terbutaline and hydration with 1L LR IV   Labor  Not present  Cervix closed at this time, cervical length unchanged from last ultrasound on 01/28/2019. I personally reviewed the images from ultrasound today and ultrasound performed on 01/28/2019. I also discussed today's  ultrasound with radiologist Dr. Dorise Bullion III.   Fetal Wellbeing  Reactive NST, reassuring for GA  D/c home stable, precautions reviewed, follow-up as scheduled.    Lisette Grinder 02/07/2019 11:51 AM  ----- Lisette Grinder, CNM Certified Nurse Midwife [facility], Department of [facility]

## 2019-02-07 NOTE — Unmapped (Signed)
-----   Message from Janeece Riggers sent at 02/06/2019  1:13 PM EDT -----  Regarding: FW: RN visit for patient on May 8th    ----- Message -----  From: Colin Benton, MD  Sent: 02/05/2019   5:48 PM EDT  To: , #  Subject: RN visit for patient on May 8th                  Hello,     Can we schedule an RN visit for patient for 1 hr gtt?     Thank you,     Renard Hamper Talati

## 2019-02-07 NOTE — Unmapped (Signed)
2nd attempt: needs 3-4 weeks rob/mfm & u/s  & 1 hr glucose

## 2019-02-07 NOTE — Unmapped (Signed)
This was a telehealth service where a resident was involved. I was immediately available via telephone and I discussed patient 's clinical presentation and relevant parts of the history and I reviewed and agreed with resident's note above.     Milinda Hirschfeld, MD  Neurology

## 2019-02-08 NOTE — Unmapped (Signed)
Called patient in response to mychart message where she requested call from provider. Pt reports that she has been having contractions for 24 hrs and had to go to [facility] this AM. Was evaluated, received terbutaline and was discharged to home. She also notes continuation of sx although has been able to rest on and off.     Labor precautions were specifically reviewed. I advised the patient to come to L&D if she continues to feel contractions, bleeding, loss of fluid, decreased fetal movement. I advised her to call on call line in place of my chart for these acute concerns, including middle of night. I advised her to go to nearest hospital if she feels delivery is imminent.     The patient questioned the possibility of delivery at 32 weeks. I discussed that I do not recommend elective delivery in the early preterm period given significant fetal risks of prematurity and higher risk of maternal complications (cesarean delivery, postpartum depression). The patient expressed understanding.     Jacklynn Ganong MD

## 2019-02-12 ENCOUNTER — Ambulatory Visit
Admit: 2019-02-12 | Discharge: 2019-02-12 | Disposition: A | Discharge status: Home / Self Care | Payer: PRIVATE HEALTH INSURANCE | Attending: Family

## 2019-02-12 LAB — COMPREHENSIVE METABOLIC PANEL
ALBUMIN: 3.7 g/dL (ref 3.5–5.0)
ALKALINE PHOSPHATASE: 107 U/L (ref 38–126)
ALT (SGPT): 9 U/L (ref <35)
ANION GAP: 9 mmol/L (ref 7–15)
AST (SGOT): 17 U/L (ref 14–38)
BILIRUBIN TOTAL: 0.2 mg/dL (ref 0.0–1.2)
BLOOD UREA NITROGEN: 5 mg/dL — ABNORMAL LOW (ref 7–21)
BUN / CREAT RATIO: 11
CALCIUM: 9.8 mg/dL (ref 8.5–10.2)
CO2: 22 mmol/L (ref 22.0–30.0)
CREATININE: 0.44 mg/dL — ABNORMAL LOW (ref 0.60–1.00)
EGFR CKD-EPI AA FEMALE: >90 mL/min/{1.73_m2} (ref >=60)
EGFR CKD-EPI NON-AA FEMALE: >90 mL/min/{1.73_m2} (ref >=60)
GLUCOSE RANDOM: 104 mg/dL (ref 70–179)
POTASSIUM: 4.2 mmol/L (ref 3.5–5.0)
SODIUM: 137 mmol/L (ref 135–145)

## 2019-02-12 LAB — CBC W/ AUTO DIFF
BASOPHILS ABSOLUTE COUNT: 0.1 10*9/L (ref 0.0–0.1)
EOSINOPHILS ABSOLUTE COUNT: 0.2 10*9/L (ref 0.0–0.4)
EOSINOPHILS RELATIVE PERCENT: 1.2 %
HEMATOCRIT: 34.9 % — ABNORMAL LOW (ref 36.0–46.0)
HEMOGLOBIN: 11.5 g/dL — ABNORMAL LOW (ref 13.5–16.0)
LARGE UNSTAINED CELLS: 1 % (ref 0–4)
LYMPHOCYTES ABSOLUTE COUNT: 2.7 10*9/L (ref 1.5–5.0)
LYMPHOCYTES RELATIVE PERCENT: 21 %
MEAN CORPUSCULAR HEMOGLOBIN CONC: 32.9 g/dL (ref 31.0–37.0)
MEAN CORPUSCULAR HEMOGLOBIN: 29.1 pg (ref 26.0–34.0)
MEAN CORPUSCULAR VOLUME: 88.6 fL (ref 80.0–100.0)
MEAN PLATELET VOLUME: 8.5 fL (ref 7.0–10.0)
MONOCYTES ABSOLUTE COUNT: 0.6 10*9/L (ref 0.2–0.8)
NEUTROPHILS ABSOLUTE COUNT: 9.3 10*9/L — ABNORMAL HIGH (ref 2.0–7.5)
NEUTROPHILS RELATIVE PERCENT: 71.7 %
PLATELET COUNT: 245 10*9/L (ref 150–440)
RED BLOOD CELL COUNT: 3.94 10*12/L — ABNORMAL LOW (ref 4.00–5.20)
RED CELL DISTRIBUTION WIDTH: 14.5 % (ref 12.0–15.0)
WBC ADJUSTED: 12.9 10*9/L — ABNORMAL HIGH (ref 4.5–11.0)

## 2019-02-12 LAB — URINALYSIS WITH CULTURE REFLEX
BILIRUBIN UA: NEGATIVE
BLOOD UA: NEGATIVE
GLUCOSE UA: NEGATIVE
KETONES UA: NEGATIVE
LEUKOCYTE ESTERASE UA: NEGATIVE
NITRITE UA: NEGATIVE
PROTEIN UA: NEGATIVE
RBC UA: 0 /HPF (ref <4)
SPECIFIC GRAVITY UA: 1.01 (ref 1.005–1.040)
WBC UA: 1 /HPF (ref 0–5)

## 2019-02-12 LAB — SODIUM: Sodium:SCnc:Pt:Ser/Plas:Qn:: 137

## 2019-02-12 LAB — LIPASE: Triacylglycerol lipase:CCnc:Pt:Ser/Plas:Qn:: 52

## 2019-02-12 LAB — NEUTROPHILS RELATIVE PERCENT: Lab: 71.7

## 2019-02-12 LAB — PROTEIN UA: Protein:MCnc:Pt:Urine:Qn:Test strip: NEGATIVE

## 2019-02-12 NOTE — Unmapped (Signed)
Pt stating stating she has vomited 6 times today and she is having blood. Pt stating the vomit is red, it's blood. Pt stating last emesis was mostly spitting up blood. Pt has [employer] with her and apple juice. Nurse asked pt not to eat anything further. Pt is [redacted] weeks pregnant. G11P1 Pt stating she did have some throat pain and is complaining she is drained. Pt took phenergan PTA.

## 2019-02-12 NOTE — Unmapped (Signed)
[facility] Emergency Department Provider Note      ED Clinical Impression     Final diagnoses:   Mallory-Weiss tear (Primary)   [redacted] weeks gestation of pregnancy   Nausea/vomiting in pregnancy       Initial Impression, ED Course, Assessment and Plan     Morgan Hebert is a 33 y.o. female patient, [id number] at [redacted]w[redacted]d (due date 04/25/2019), with history of HIV (undetectable viral load as of 09/26/18) and pseudotumor cerebri presenting today with vomiting blood.  Patient states she has had difficulty with nausea/vomiting throughout her pregnancy.  She woke this morning and had vomiting.  States the first 2 episodes the vomit was yellow in color.  States she is had 4 subsequent episodes with bright red blood mixed with yellow bile.  States she contacted the GYN clinic and was advised to come to the ER for evaluation.  Patient does report abdominal pain, that she describes as contractions, that has been ongoing since last night.  States the contractions last anywhere from 30 to 45 seconds, and occur every 5 to 10 minutes.  She did take promethazine around 11 AM this morning, and has not had any subsequent vomiting.  She denies any fever, chills, or URI symptoms.    On exam she does not appear acutely ill or toxic.  She is afebrile with a heart rate of 122.  Blood pressure is normal.  Abdomen is soft, distended consistent with gravid uterus.  Fetal heart tones are measured at 144 strong, regular, located in the right lower quadrant of the abdomen.  He has no significant lower extremity edema.    I have ordered labs as well as UA.  Plan to discuss case with GYN attending.      3:30 PM  OB attending paged.    4:28 PM  OB attending paged regarding pt.      5:06 PM  Labs reviewed and WBC is 12.9.  H&H is 11.5 and 34.9.  Up from 11.1 and 33.7 on 02/05/2019.  Platelet count is normal.  CMP is unremarkable.  Lipase is normal.  UA is negative for nitrates, leukocytes, and ketones.  UA positive for 12 squamous cells consistent with contamination.  No return call from [facility] attending after 2 pages.  PLC contacted.      5:21 PM  Certainly reassured by the patient's lab and physical exam.  She has been on the telephone the entire time she has been here.  She came carrying a [employer] bag, and has been requesting to eat.  She has had absolutely no vomiting while in the ED, and reports she is had no vomiting since 11 AM.  She did request something for nausea, and was given a dose of Zofran.  I suspect her bloody emesis was likely from a Mallory-Weiss tear. I did discuss the case with OB attending via [facility].  OB was open to accepting the patient in transfer for NST and monitoring of preterm labor if pt felt like she was having consistent, painful contraction.  When I discussed this with the patient, she tells me she is not concerned about her contractions.  States she has had off-and-on contractions for several weeks.  The patient the contractions are no different than what she is experienced previously.  She tells me she has been able to sleep through them.  Patient states she was mainly concerned about the vomiting blood.  At this point the pt does not believe she needs any further monitoring  in regards to her contractions.  Again she has good fetal heart tones, and reports good fetal movement.  Discussed with the patient would like to p.o. challenge her, and if she tolerates it well, will discharge home to follow-up with OB.    6:07 PM  Pt tol po challenge well.  Again pt does not wish to be transferred to main hospital for labor monitoring.  She has been given strict return precautions and did express understanding.      Labs     Labs Reviewed   COMPREHENSIVE METABOLIC PANEL - Abnormal; Notable for the following components:       Result Value    BUN 5 (*)     Creatinine 0.44 (*)     All other components within normal limits   URINALYSIS WITH CULTURE REFLEX - Abnormal; Notable for the following components:    Squam Epithel, UA 12 (*) Bacteria, UA Moderate (*)     All other components within normal limits   CBC W/ AUTO DIFF - Abnormal; Notable for the following components:    WBC 12.9 (*)     RBC 3.94 (*)     HGB 11.5 (*)     HCT 34.9 (*)     Absolute Neutrophils 9.3 (*)     All other components within normal limits    Narrative:     Please use the Absolute Differential for reference ranges.    LIPASE - Normal   URINE CULTURE   CBC W/ DIFFERENTIAL    Narrative:     The following orders were created for panel order CBC w/ Differential.                  Procedure                               Abnormality         Status                                     ---------                               -----------         ------                                     CBC w/ Differential[[id number]]         Abnormal            Final result                                                 Please view results for these tests on the individual orders.   TYPE AND SCREEN       History     Chief Complaint  Emesis During Pregnancy      HPI   Patient was seen by me at 3:10 PM.    Patient is a 33 y.o. female [id number] at [redacted]w[redacted]d (due date: 04/25/2019) with a PMH of HIV (undetectable as of 09/26/2018) and pseudotumor cerebri presenting to the  ED for vomiting while pregnant. Patient reports she has struggled with nausea/vomiting throughout her pregnancy.  Patient reports she woke this morning with abrupt onset of vomiting around 7 AM.  States vomiting has been associated with abdominal pain she describes as contractions.  Patient states vomit was initially yellow in color.  States she had 2 episodes of yellow vomiting followed by 4 episodes of vomiting with yellow bile as well as bright red blood.  She is unable to quantify the blood.  She reports she has not had any vomiting since 11 AM after taking Phenergan.  Again patient states she has been having what she describes as contractions, lasting 30 to 45 seconds every 5 to 10 minutes.  She has been seen at [facility] as well as [facility] for preterm labor, and has received 2 betamethasone injections.  Patient denies any associated fever.  No chest pain or difficulty breathing.  She has no headache or vision changes.  No urinary symptoms.  No cough, congestion, or URI symptoms.  She denies being around anyone who has been sick.  Reports her last bowel movement was yesterday and was normal.    Previous chart, nursing notes, and vital signs reviewed.      Pertinent labs & imaging results that were available during my care of the patient were reviewed by me and considered in my medical decision making (see chart for details).    Past Medical History:   Diagnosis Date   ??? Abnormal mammogram    ??? Constipation    ??? Diarrhea    ??? Environmental allergies    ??? HIV (human immunodeficiency virus infection) (CMS-HCC)    ??? HIV (human immunodeficiency virus infection) (CMS-HCC)    ??? IUD (intrauterine device) in place    ??? Migraine    ??? Obesity    ??? Pseudotumor cerebri    ??? STD (sexually transmitted disease)        Past Surgical History:   Procedure Laterality Date   ??? DILATION AND CURETTAGE OF UTERUS     ??? LUMBAR PUNCTURE     ??? PR COLONOSCOPY W/BIOPSY SINGLE/MULTIPLE  07/03/2014    Procedure: COLONOSCOPY, FLEXIBLE, PROXIMAL TO SPLENIC FLEXURE; WITH BIOPSY, SINGLE OR MULTIPLE;  Surgeon: Billie Ruddy, MD;  Location: GI PROCEDURES [facility];  Service: Gastroenterology   ??? PR DILATION/CURETTAGE,DIAGNOSTIC N/A 06/14/2018    Procedure: DILATION AND CURETTAGE, DIAGNOSTIC AND/OR THERAPEUTIC (NON OBSTETRICAL);  Surgeon: Nelle Don, MD;  Location: [facility];  Service: [facility] Primary Gynecology   ??? PR UPPER GI ENDOSCOPY,BIOPSY N/A 07/03/2014    Procedure: UGI ENDOSCOPY; WITH BIOPSY, SINGLE OR MULTIPLE;  Surgeon: Billie Ruddy, MD;  Location: [facility] PROC[facility];  Service: Gastroenterology   ??? WISDOM TOOTH EXTRACTION           Current Facility-Administered Medications:   ???  dextrose 50 gram/ 296 mL oral solution 50 g, 50 g, Oral, Once, Asha Boris Lown, MD    Current Outpatient Medications:   ???  acetaminophen (TYLENOL) 500 MG tablet, Take 1,000 mg by mouth., Disp: , Rfl:   ???  acetaZOLAMIDE (DIAMOX) 250 MG tablet, Take 2 tablets (500 mg total) by mouth Two (2) times a day., Disp: 120 tablet, Rfl: 3  ???  bictegrav-emtricit-tenofov ala (BIKTARVY) 50-200-25 mg tablet, Take 1 tablet by mouth daily., Disp: 30 tablet, Rfl: 11  ???  diphenhydrAMINE (BENADRYL) 25 mg capsule, Take 25 mg by mouth., Disp: , Rfl:   ???  hydrocortisone 1 % ointment,  Apply topically Two (2) times a day., Disp: 30 g, Rfl: 11  ???  ketoconazole (NIZORAL) 2 % cream, Apply 1 application topically daily. (Patient not taking: Reported on 11/19/2018), Disp: 15 g, Rfl: 11  ???  prenatal vit no.124-iron-folic (PRENATAL VITAMIN) 27 mg iron- 800 mcg Tab, Take 1 tablet by mouth., Disp: , Rfl:   ???  promethazine (PHENERGAN) 25 MG tablet, Take 1 tablet (25 mg total) by mouth every eight (8) hours as needed for nausea (Nausea or headache)., Disp: 90 tablet, Rfl: 2  ???  SUMAtriptan (IMITREX) 50 MG tablet, Take 1 tablet (50 mg total) by mouth every two (2) hours as needed for migraine (Maximum 2 doses/day & 4 doses/week)., Disp: 16 tablet, Rfl: 2    Allergies  Peanut    Family History   Problem Relation Age of Onset   ??? Cervical cancer Mother    ??? Diabetes Mother    ??? Asthma Mother    ??? Hypertension Mother    ??? Diabetes Maternal Aunt    ??? Hypertension Maternal Aunt    ??? Breast cancer Maternal Grandmother    ??? Cancer Maternal Grandmother    ??? No Known Problems Daughter    ??? Glaucoma Neg Hx        Social History  Social History     Tobacco Use   ??? Smoking status: Former Smoker     Types: Cigarettes   ??? Smokeless tobacco: Never Used   Substance Use Topics   ??? Alcohol use: Not Currently     Alcohol/week: 0.0 standard drinks     Comment: rare   ??? Drug use: Not Currently     Types: Marijuana       Review of Systems    Constitutional: Negative for fever or chills.   Eyes: Negative for visual changes.  ENT: Negative for rhinorrhea/nasal congestion.   Cardiovascular: Negative for chest pain.  Respiratory: Negative for shortness of breath or cough.  Gastrointestinal: Positive for hematemesis and vomiting. Negative for constipation or diarrhea.  Genitourinary: Negative for dysuria, urgency, frequency, hesitancy, hematuria. Negative for vaginal discharge or bleeding.  Musculoskeletal: Negative for back pain. Negative for LE edema.   Skin: Negative for rash.   Neurological: Negative for headaches, weakness, numbness/tingling.  Psych: Negative for SI, HI, A/V Hallucinations. Negative for agitation.       Physical Exam     VITAL SIGNS:    Vitals:    02/12/19 1508   BP: 141/75   Pulse: 122   Resp: 18   Temp: 37.3 ??C (99.2 ??F)   TempSrc: Oral   SpO2: 99%   Weight: 81.2 kg (179 lb)   Height: 149.9 cm (4' 11)         Constitutional: Alert and oriented. Well appearing and in no distress.  Eyes: Conjunctivae are normal.   ENT       Head: Normocephalic and atraumatic.       Ear: EACs without exudate or erythema. TMs without erythema or effusion.       Nose: No congestion. No epistaxis.       Mouth/Throat: Mucous membranes are moist without lesions/ulcerations. Posterior        oropharynx is patent.        Neck: Supple.  Full ROM without pain.  Hematological/Lymphatic/Immunilogical: No cervical lymphadenopathy.  Cardiovascular: Tachycardia, regular rhythm.  No gallops, murmurs, or clicks.  Respiratory: Normal respiratory effort. Breath sounds are normal.  Gastrointestinal: Soft, distended consistent with gravid uterus.  There is  no focal tenderness.  Musculoskeletal: Normal range of motion in all extremities.  No significant lower extremity edema.  Neurologic: Normal speech and language.  Patient with equal and intact strength in the upper and lower extremities.  There is no focal weakness or deficits.    Skin: Skin is warm, dry and intact. No rash noted.  Psychiatric: Mood and affect are normal. Speech and behavior are normal. Documentation assistance was provided by Woody Seller, Scribe, on Feb 12, 2019 at 3:10 PM for Zackery Barefoot, NP.      Documentation assistance was provided by the scribe in my presence.  The documentation recorded by the scribe has been reviewed by me and accurately reflects the services I personally performed.         Quentin Angst, FNP  02/18/19 1447

## 2019-02-12 NOTE — Unmapped (Signed)
Pt called nurse line with c/o vomiting blood this morning. Patient states she woke up this morning with vomiting, x4 episodes so far. Patient states she is vomiting both bile and blood. Patient denies diarrhea but does have some stomach discomfort. Patient endorses normal fetal movement, denies vaginal bleeding and fluid leaking and states no change in contractions. This nurse advised patient to seek care at emergency department or local urgent care. Patient verbalized understanding and agreement with plan.

## 2019-02-14 NOTE — Unmapped (Signed)
Last Visit Date: 01/23/2019  Next Visit Date: Visit date not found    Lab Results   Component Value Date    Hep B Surface Ag Nonreactive 09/26/2018    Hepatitis B Surface Ag Negative 02/13/2014    Hep B S Ab Reactive 02/13/2014    Hepatitis C Ab Nonreactive 10/22/2018    Hepatitis C Ab Negative 02/13/2014    HIV Antigen/Antibody Combo SEE BELOW (AA) 02/09/2014        No results found for this or any previous visit.    No results found for this or any previous visit.    No results found for this or any previous visit.    No results found for this or any previous visit.

## 2019-02-14 NOTE — Unmapped (Signed)
[facility] Refill Coordination Note    Specialty Medication(s) to be Shipped:   Infectious Disease: Biktarvy    Other medication(s) to be shipped: N/A     Morgan Hebert, DOB: 1986/03/23  Phone: [phone] (home) [phone] (work)      All above HIPAA information was verified with patient.     Completed refill call assessment today to schedule patient's medication shipment from the [facility] [phone]).       Specialty medication(s) and dose(s) confirmed: Regimen is correct and unchanged.   Changes to medications: Jasminne reports no changes at this time.  Changes to insurance: No  Questions for the pharmacist: No    Confirmed patient received Welcome Packet with first shipment. The patient will receive a drug information handout for each medication shipped and additional FDA Medication Guides as required.       DISEASE/MEDICATION-SPECIFIC INFORMATION        N/A    SPECIALTY MEDICATION ADHERENCE     Medication Adherence    Patient reported X missed doses in the last month:  0  Specialty Medication:  BIKTARVY                      SHIPPING     Shipping address confirmed in Epic.     Delivery Scheduled: Yes, Expected medication delivery date: 5/13.     Medication will be delivered via Next Day Courier to the home address in Epic WAM.    [name]   [facility] Specialty Technician

## 2019-02-15 MED ORDER — SUMATRIPTAN 50 MG TABLET
ORAL_TABLET | ORAL | 2 refills | 0.00000 days | Status: CP | PRN
Start: 2019-02-15 — End: 2019-06-11

## 2019-02-15 MED ORDER — PROMETHAZINE 25 MG TABLET
ORAL_TABLET | Freq: Three times a day (TID) | ORAL | 2 refills | 0 days | Status: CP | PRN
Start: 2019-02-15 — End: 2019-03-31

## 2019-02-15 NOTE — Unmapped (Signed)
Last Visit Date: 08/22/2018  Next Visit Date: Visit date not found    Lab Results   Component Value Date    Hep B Surface Ag Nonreactive 09/26/2018    Hepatitis B Surface Ag Negative 02/13/2014    Hep B S Ab Reactive 02/13/2014    Hepatitis C Ab Nonreactive 10/22/2018    Hepatitis C Ab Negative 02/13/2014    HIV Antigen/Antibody Combo SEE BELOW (AA) 02/09/2014        No results found for this or any previous visit.    No results found for this or any previous visit.    No results found for this or any previous visit.    No results found for this or any previous visit.

## 2019-02-19 MED FILL — BIKTARVY 50 MG-200 MG-25 MG TABLET: ORAL | 30 days supply | Qty: 30 | Fill #3

## 2019-02-19 MED FILL — BIKTARVY 50 MG-200 MG-25 MG TABLET: 30 days supply | Qty: 30 | Fill #3 | Status: AC

## 2019-02-21 ENCOUNTER — Other Ambulatory Visit: Payer: Self-pay

## 2019-02-21 ENCOUNTER — Encounter: Admit: 2019-02-21 | Discharge: 2019-02-21 | Payer: BLUE CROSS/BLUE SHIELD

## 2019-02-21 DIAGNOSIS — O09893 Supervision of other high risk pregnancies, third trimester: Secondary | ICD-10-CM

## 2019-02-21 MED ORDER — METRONIDAZOLE 500 MG TABLET
ORAL_TABLET | Freq: Two times a day (BID) | ORAL | 0 refills | 0.00000 days | Status: CP
Start: 2019-02-21 — End: 2019-03-31

## 2019-02-21 MED ORDER — ONDANSETRON HCL 4 MG TABLET
ORAL_TABLET | ORAL | 2 refills | 0.00000 days | Status: CP
Start: 2019-02-21 — End: 2019-03-31

## 2019-02-21 NOTE — Unmapped (Addendum)
Patient is [id number] at [redacted]w[redacted]d with really bad contractions and she thinks her mucus plug came out. Advised her to come to L&D triage.    Ollen Gross, MD

## 2019-02-21 NOTE — Unmapped (Signed)
Pt called reporting she has been vomiting today and her phenergan is not working. She is asking for something else for nausea.  She has not started flagyl yet. She reports good fetal movement, no contractions, no loss of fluid or bleeding. Dr. Daylene Posey.

## 2019-02-21 NOTE — Unmapped (Signed)
Pt called back with instructions from Dr. Kathrynn Speed for nausea and vomiting, take zofran odt 4 mg every 4 hours with 30 tablets and 2 refills, rx called into [facility] in [place] on [address] phone [phone].    Also advised pt per Dr. Kathrynn Speed to come to L&D if nausea and vomiting continue for the next few hours, pt agreed.

## 2019-02-22 NOTE — Unmapped (Signed)
covid 19 done

## 2019-02-22 NOTE — Unmapped (Signed)
Pharmacy called for Rx clarification. Advised that Rx is PRN so quantity is correct.

## 2019-02-25 ENCOUNTER — Inpatient Hospital Stay: Admission: RE | Admit: 2019-02-25 | Payer: 59 | Source: Ambulatory Visit

## 2019-02-25 ENCOUNTER — Ambulatory Visit
Admit: 2019-02-25 | Discharge: 2019-02-25 | Payer: BLUE CROSS/BLUE SHIELD | Attending: Maternal & Fetal Medicine | Primary: Maternal & Fetal Medicine

## 2019-02-25 ENCOUNTER — Ambulatory Visit: Admit: 2019-02-25 | Discharge: 2019-02-25 | Payer: BLUE CROSS/BLUE SHIELD

## 2019-02-25 DIAGNOSIS — B20 Human immunodeficiency virus [HIV] disease: Principal | ICD-10-CM

## 2019-02-25 DIAGNOSIS — O4703 False labor before 37 completed weeks of gestation, third trimester: Secondary | ICD-10-CM

## 2019-02-25 DIAGNOSIS — O0990 Supervision of high risk pregnancy, unspecified, unspecified trimester: Secondary | ICD-10-CM

## 2019-02-25 DIAGNOSIS — O98713 Human immunodeficiency virus [HIV] disease complicating pregnancy, third trimester: Secondary | ICD-10-CM

## 2019-02-25 DIAGNOSIS — G932 Benign intracranial hypertension: Secondary | ICD-10-CM

## 2019-02-25 LAB — CBC W/ AUTO DIFF
BASOPHILS ABSOLUTE COUNT: 0 10*9/L (ref 0.0–0.1)
BASOPHILS RELATIVE PERCENT: 0.4 %
EOSINOPHILS ABSOLUTE COUNT: 0.2 10*9/L (ref 0.0–0.4)
EOSINOPHILS RELATIVE PERCENT: 1.6 %
HEMATOCRIT: 34.3 % — ABNORMAL LOW (ref 36.0–46.0)
LARGE UNSTAINED CELLS: 2 % (ref 0–4)
LYMPHOCYTES ABSOLUTE COUNT: 2.8 10*9/L (ref 1.5–5.0)
LYMPHOCYTES RELATIVE PERCENT: 29.7 %
MEAN CORPUSCULAR HEMOGLOBIN CONC: 32.7 g/dL (ref 31.0–37.0)
MEAN CORPUSCULAR HEMOGLOBIN: 29.7 pg (ref 26.0–34.0)
MEAN CORPUSCULAR VOLUME: 90.8 fL (ref 80.0–100.0)
MEAN PLATELET VOLUME: 11.9 fL — ABNORMAL HIGH (ref 7.0–10.0)
MONOCYTES RELATIVE PERCENT: 6.2 %
NEUTROPHILS ABSOLUTE COUNT: 5.7 10*9/L (ref 2.0–7.5)
NEUTROPHILS RELATIVE PERCENT: 60.5 %
PLATELET COUNT: 218 10*9/L (ref 150–440)
RED BLOOD CELL COUNT: 3.78 10*12/L — ABNORMAL LOW (ref 4.00–5.20)
RED CELL DISTRIBUTION WIDTH: 14.4 % (ref 12.0–15.0)
WBC ADJUSTED: 9.4 10*9/L (ref 4.5–11.0)

## 2019-02-25 LAB — BASIC METABOLIC PANEL
ANION GAP: 6 mmol/L — ABNORMAL LOW (ref 7–15)
BLOOD UREA NITROGEN: 7 mg/dL (ref 7–21)
BUN / CREAT RATIO: 13
CALCIUM: 9.7 mg/dL (ref 8.5–10.2)
CHLORIDE: 105 mmol/L (ref 98–107)
CO2: 23 mmol/L (ref 22.0–30.0)
CREATININE: 0.56 mg/dL — ABNORMAL LOW (ref 0.60–1.00)
EGFR CKD-EPI AA FEMALE: >90 mL/min/{1.73_m2} (ref >=60)
EGFR CKD-EPI NON-AA FEMALE: >90 mL/min/{1.73_m2} (ref >=60)
GLUCOSE RANDOM: 78 mg/dL (ref 70–179)
POTASSIUM: 4.2 mmol/L (ref 3.5–5.0)
SODIUM: 134 mmol/L — ABNORMAL LOW (ref 135–145)

## 2019-02-25 LAB — HEMATOCRIT: Lab: 34.3 — ABNORMAL LOW

## 2019-02-25 LAB — ANION GAP: Anion gap 3:SCnc:Pt:Ser/Plas:Qn:: 6 — ABNORMAL LOW

## 2019-02-25 LAB — BILIRUBIN TOTAL: Bilirubin:MCnc:Pt:Ser/Plas:Qn:: <0.1

## 2019-02-25 LAB — AST (SGOT): Aspartate aminotransferase:CCnc:Pt:Ser/Plas:Qn:: 16

## 2019-02-25 LAB — ALT (SGPT): Alanine aminotransferase:CCnc:Pt:Ser/Plas:Qn:: 7

## 2019-02-25 NOTE — Unmapped (Addendum)
The patient has been followed closely by [facility] infectious disease.  They have ordered blood work previously which will be drawn today.  The patient had a normal growth scan today with estimated fetal weight at the 19th percentile.  We will plan to repeat growth in 4 to 5 weeks (scheduled)    Addendum: Her HIV genotyping was erroneously not drawn today, and so it was reordered to be drawn when she returns in a couple days for her 1 hour GTT

## 2019-02-25 NOTE — Unmapped (Signed)
Baseline daily headaches are unchanged in intensity or duration.  Plan to continue Diamox 500 mg 3 times daily

## 2019-02-25 NOTE — Unmapped (Signed)
Received BMZ 4/22 ([place])-4/23 [facility]) for concern for PTL. SVE cl/th/hi.  Today she denies any regular contractions, leaking, bleeding, or abnormal discharge.

## 2019-02-25 NOTE — Unmapped (Signed)
The patient is without complaints today other than her baseline headache from pseudotumor cerebri (see below).  Initial plan was for patient to repeat her 1 hour GTT today, since her first 1 was likely a false positive secondary to steroids.  The patient stated she cannot stay for her 1 hour GTT today but that she will come in this week and have it performed as a nurse visit, which was scheduled.  She received Tdap today.  Plan to discuss contraception at next visit.

## 2019-02-25 NOTE — Unmapped (Signed)
[facility]                                                                         Return Prenatal Note    Assessment/Plan:     33 y.o. [id number] at [redacted]w[redacted]d presents for a follow-up OB visit for high risk pregnancy.     Supervision of high risk pregnancy, unspecified, unspecified trimester  The patient is without complaints today other than her baseline headache from pseudotumor cerebri (see below).  Initial plan was for patient to repeat her 1 hour GTT today, since her first 1 was likely a false positive secondary to steroids.  The patient stated she cannot stay for her 1 hour GTT today but that she will come in this week and have it performed as a nurse visit, which was scheduled.  She received Tdap today.  Plan to discuss contraception at next visit.    HIV  The patient has been followed closely by [facility] infectious disease.  They have ordered blood work previously which will be drawn today.  The patient had a normal growth scan today with estimated fetal weight at the 19th percentile.  We will plan to repeat growth in 4 to 5 weeks (scheduled)    Addendum: Her HIV genotyping was erroneously not drawn today, and so it was reordered to be drawn when she returns in a couple days for her 1 hour GTT    Pseudotumor cerebri  Baseline daily headaches are unchanged in intensity or duration.  Plan to continue Diamox 500 mg 3 times daily    Threatened premature labor in third trimester  Received BMZ 4/22 ([place])-4/23 [facility]) for concern for PTL. SVE cl/th/hi.  Today she denies any regular contractions, leaking, bleeding, or abnormal discharge.      Return in about 16 days (around 03/13/2019).        Subjective:      33 y.o. [id number] at [redacted]w[redacted]d presents for follow-up OB visit for high risk pregnancy. Patient denies contractions, leakage of fluid, or vaginal bleeding.     Objective:     Flowsheet Vitals:  Heart Rate: 111  BP: 105/68  Urine Glucose: Negative  Urine Protein: Negative  Movement: Present  Loss of Fluid: No  Bleeding: No  Contractions: Irregular  Total weight gain: 2.359 kg (5 lb 3.2 oz)    General Appearance:  No acute distress, well appearing, and well nourished  Pulmonary:   Normal work of breathing  Cardiovascular:  Normal heart rate      Edema: none.  Gastrointestinal:  Abdomen soft, gravid, no rebound or guarding  Genitourinary:   SVE Deferred     Musculoskeletal:  Normal gait, grossly normal range of motion  Neurologic:   Alert and oriented to person, place, and time  Psychiatric:   Mood and affect within normal limits  Integumentary :  Skin is warm, dry, no rash noted      I spent 25 minutes in face-to-face communication with Ms. Bryngelson, greater than 50% of which was in counseling and education as above.       Florencia Reasons, MD  _____________________________________________________________________

## 2019-02-25 NOTE — Unmapped (Signed)
Duration of Intervention: 15 minutes    SW texted pt as needs f/u visit with provider scheduled. Offered time. Pt stated this Wednesday by phone is okay. This was previously okay with provider. Pt was scheduled. SW asked how pt is doing and pt stated well. Pt excited that baby is four pounds. Pt expressed no other immediate concerns for SW intervention at this time.      Bradly Bienenstock LCSWA, CHES

## 2019-02-26 LAB — HIV RNA LOG(10): Lab: 0

## 2019-02-26 LAB — HIV RNA, QUANTITATIVE, PCR

## 2019-02-26 NOTE — Unmapped (Signed)
Called pt to schedule ultrasound Pt aware of covid 19 policies  02/26/19    Travel Screening Questions Completed.    Travel Screening Questions/Answers:  1). Have you traveled within the last 14 days?: No  2). Do you have any of the following symptoms that are new or worsening: cough, shortness of breath, loss of taste/smell, sore throat, fever or feeling feverish, repeated shaking chills, muscle pain, vomiting, or diarrhea?: No  3). Have you had close contact with a person with confirmed COVID-19 in the last 21 days before symptoms began?: No  4). Have you tested positive in the last 21 days for COVID-19?: No      Negative Travel Screen: Patient answered NO to questions 2, 3, and 4. Offer Telephone visit, Virtual Visit or In Person Visit according to the current scheduling protocol for your clinic.       The call was handled in the following manner: Scheduled for In Person Visit

## 2019-02-27 NOTE — Unmapped (Deleted)
Assessment/Plan:      Morgan Hebert, a 33 y.o. female seen today for routine HIV followup.    Plan:  HIV  Fills ART via private insurance. Due for HMAP renewal.  ?? Continue current therapy. E-prescribed at recent visit, no refills needed.   ?? Checking HIV RNA & safety labs (brief return)    ?? Encouraged continued excellent ARV adherence.  Lab Results   Component Value Date    ACD4 1,118 10/22/2018    CD4 54 10/22/2018    HIVCP <40 (H) 02/25/2019    HIVRS Detected (A) 02/25/2019   ?? Discussed decline in CD4 count over past year from 1300-->900 with associated decrease in % and what this means in terms of reflecting immunologic damage from being off ART. Now back up to 1100 with robust %. Discussed that we sometimes see some CD4 suppression (total #) in pregnancy but that this is not of clinical significance as long as she remains on ART and durably suppressed.   ?? I don't see an indication for CD4 recheck in pregnancy - only reason would be to see if she needed OI prophylaxis which is not likely given high numbers.  ?? Would just follow viral load.    Pregnancy  ?? [id number], [redacted]w[redacted]d today, EDD 04/25/19  ?? Will see her every 29m or so during pregnancy  ?? Reinforced need for ART adherence.     Pseudotumor cerebri  ?? Working with neurologists, symptoms are stable  ?? For ophtho followup.    Skin lesion  ?? To try antifungal cream, rxed today.    Sexual health & secondary prevention  Sex with female partner (husband). Monogamous with single partner. She does disclose status. Never uses condoms.    Lab Results   Component Value Date    RPR Nonreactive 02/05/2019    CTNAA Negative 10/11/2018    CTNAA Negative 09/26/2018    CTNAA Negative 03/01/2017    GCNAA Negative 10/11/2018    GCNAA Negative 09/26/2018    GCNAA Negative 03/01/2017    SPECTYPE Swab 10/11/2018    SPECTYPE Urine 09/26/2018    SPECTYPE Urine 03/01/2017    SPECSOURCE Endocervix 10/11/2018    SPECSOURCE Urine 09/26/2018    SPECSOURCE Urine 03/01/2017     ?? GC/CT NAATs -- performed 09/2018   ?? RPR -- NR 09/2018 - repeat 1Y      Health maintenance  Lab Results   Component Value Date    CREATININE 0.56 (L) 02/25/2019    QFTTBGOLD NEGATIVE 02/13/2014    HEPCAB Nonreactive 10/22/2018    CHOL 140 03/28/2018    HDL 42 03/28/2018    LDL 78 03/28/2018    NONHDL 98 03/28/2018    TRIG 99 03/28/2018    A1C 5.2 10/22/2018    PAP Negative for intraephithelial lesion or malignancy 03/29/2017    FINALDX  06/14/2018     A: Products of conception, removal  -Immature and hydropic chorionic villi, inflamed gestational endometrium and decidua with necrosis, most  consistent with hydropic abortus (see comment)  - No diagnostic gestational trophoblastic disease identified (correlate with molecular studies)    This electronic signature is attestation that the pathologist personally reviewed the submitted material(s) and the final diagnosis reflects that evaluation.       Communicable diseases  # TB - no longer needed; negative IGRA 2015 and low/no risk  # HCV - negative 2018; rescreen w/Ab q1-2y    Cancer screening  # Anorectal - not yet done  #  Colorectal - SCREEN AGE 35+  # Liver - not yet done  # Lung - not applicable    # Breast - SCREEN AGE 46+ -- Q1-2Y  # Cervical - neg cyto June 2018 through [facility]. Due for repeat June 2020.    Cardiovascular disease  # The ASCVD Risk score Denman George DC Jorge Ny al., 2013) failed to calculate.    Immunization History   Administered Date(s) Administered   ??? Influenza Vaccine Quad (IIV4 PF) 85mo+ injectable 06/30/2015, 06/15/2016, 07/04/2018   ??? TdaP 02/05/2019     ?? Screening ordered today: none  ?? Immunizations ordered today: none    I personally spent 30 minutes face-to-face with the patient and greater than 50% of that time was spent in counseling or coordinating care with the patient regarding meaning of lab results, indications for CD4 testing, adherence to ART, skin lesion.     Counseled as documented above regarding medication adherence.    Disposition  Return to clinic 3-4 months or sooner if needed.    [name], MD, MPH   [facility]   [address] 1st floor   [place], [place]. [address]   Phone: [phone]   Fax: [phone]      Subjective:      Chief Complaint   HIV followup    HPI  Return patient visit for Morgan Hebert, a 33 y.o. woman with now well-controlled HIV.  Pregnancy going well, 15 weeks tomorrow. EDD 04/25/19. Feels well, just tired.  Lots of stress with her job but sticking with it.    Past Medical History:   Diagnosis Date   ??? Abnormal mammogram    ??? Constipation    ??? Diarrhea    ??? Environmental allergies    ??? HIV (human immunodeficiency virus infection) (CMS-HCC)    ??? HIV (human immunodeficiency virus infection) (CMS-HCC)    ??? IUD (intrauterine device) in place    ??? Migraine    ??? Obesity    ??? Pseudotumor cerebri    ??? STD (sexually transmitted disease)      Medications and Allergies   Reviewed and updated today. See bottom of this visit's encounter summary for details.  Current Outpatient Medications on File Prior to Visit   Medication Sig   ??? acetaminophen (TYLENOL) 500 MG tablet Take 1,000 mg by mouth.   ??? acetaZOLAMIDE (DIAMOX) 250 MG tablet Take 2 tablets (500 mg total) by mouth Two (2) times a day.   ??? bictegrav-emtricit-tenofov ala (BIKTARVY) 50-200-25 mg tablet Take 1 tablet by mouth daily.   ??? diphenhydrAMINE (BENADRYL) 25 mg capsule Take 25 mg by mouth.   ??? hydrocortisone 1 % ointment Apply topically Two (2) times a day.   ??? ketoconazole (NIZORAL) 2 % cream Apply 1 application topically daily.   ??? metroNIDAZOLE (FLAGYL) 500 MG tablet Take 1 tablet (500 mg total) by mouth two (2) times a day for 7 days.   ??? ondansetron (ZOFRAN) 4 MG tablet Take 1 tablet (4 mg total) by mouth every 4 (four) hours for 15 days.   ??? prenatal vit no.124-iron-folic (PRENATAL VITAMIN) 27 mg iron- 800 mcg Tab Take 1 tablet by mouth.   ??? promethazine (PHENERGAN) 25 MG tablet Take 1 tablet (25 mg total) by mouth every eight (8) hours as needed for nausea (Nausea or headache).   ??? SUMAtriptan (IMITREX) 50 MG tablet Take 1 tablet (50 mg total) by mouth every two (2) hours as needed for migraine (Maximum 2 doses/day &  4 doses/week).     No current facility-administered medications on file prior to visit.      Allergies   Allergen Reactions   ??? 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     Social History  General ??? lives in [place] with her husband and her 1 daughter (born 2008). There is financial tension.   ?? Doreene Adas and his girlfriend are there sometimes as well.   ?? Mom and sister are both positive (sister is perinatally infected, patient with presumed sexual transmission from female partner).  ?? 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  Sexual History ??? sex with men (husband only)  Substance Use ??? marijuana (smokes nightly)  Social History     Tobacco Use   ??? Smoking status: Former Smoker     Types: Cigarettes   ??? Smokeless tobacco: Never Used   Substance Use Topics   ??? Alcohol use: Not Currently     Alcohol/week: 0.0 standard drinks     Comment: rare     Review of Systems  As per HPI. Remainder of 10 systems reviewed, negative.        Objective:      LMP 07/14/2018   Wt Readings from Last 3 Encounters:   02/25/19 81.3 kg (179 lb 3.2 oz)   02/12/19 81.2 kg (179 lb)   02/05/19 81.5 kg (179 lb 9.6 oz)     Const looks well and attentive, alert, appropriate   Eyes sclerae anicteric, noninjected OU   ENT dentition good   Lymph no cervical or supraclavicular LAD   CV RRR. No murmurs. No rub or gallop. S1/S2.   Resp CTAB ant/post, normal work of breathing   GI Soft, gravid, no organomegaly. NTND. NABS.   GU deferred   Rectal deferred   Skin left back - 2 cm flaking macular lesion (pictured) without satellite lesions   MSK full exam deferred   Neuro CN II-XII grossly intact, MAEE, non focal   Psych Appropriate affect. Eye contact good. Linear thoughts. Fluent speech.     Laboratory Data  Reviewed in Epic today, using Synopsis and Chart Review filters.    Lab Results   Component Value Date    CREATININE 0.56 (L) 02/25/2019    QFTTBGOLD NEGATIVE 02/13/2014    HEPCAB Nonreactive 10/22/2018    CHOL 140 03/28/2018    HDL 42 03/28/2018    LDL 78 03/28/2018    NONHDL 98 03/28/2018    TRIG 99 03/28/2018    A1C 5.2 10/22/2018    PAP Negative for intraephithelial lesion or malignancy 03/29/2017    FINALDX  06/14/2018     A: Products of conception, removal  -Immature and hydropic chorionic villi, inflamed gestational endometrium and decidua with necrosis, most  consistent with hydropic abortus (see comment)  - No diagnostic gestational trophoblastic disease identified (correlate with molecular studies)    This electronic signature is attestation that the pathologist personally reviewed the submitted material(s) and the final diagnosis reflects that evaluation.              _____________________________________________________________________

## 2019-02-28 ENCOUNTER — Ambulatory Visit: Payer: 59

## 2019-03-01 NOTE — Unmapped (Signed)
Left a message with pre visit info.

## 2019-03-05 ENCOUNTER — Encounter: Admit: 2019-03-05 | Discharge: 2019-03-06 | Payer: BLUE CROSS/BLUE SHIELD

## 2019-03-05 DIAGNOSIS — O98713 Human immunodeficiency virus [HIV] disease complicating pregnancy, third trimester: Secondary | ICD-10-CM

## 2019-03-05 DIAGNOSIS — Z315 Encounter for genetic counseling: Secondary | ICD-10-CM

## 2019-03-05 DIAGNOSIS — O0990 Supervision of high risk pregnancy, unspecified, unspecified trimester: Principal | ICD-10-CM

## 2019-03-05 LAB — O'SULLIVAN GLUCOSE 1 HOUR: Lab: 229 — ABNORMAL HIGH

## 2019-03-05 NOTE — Unmapped (Signed)
Called pt to do pre-visit telephone rooming for 03/06/19 Phone visit. Left VM requesting a call back.

## 2019-03-05 NOTE — Unmapped (Signed)
Labs as ordered

## 2019-03-05 NOTE — Unmapped (Signed)
Medical Case Management Social Work Note     Duration of Intervention: 5 minutes    SW texted pt to remind of phone appt tomorrow. Pt stated she will be ready. Pt expressed no other immediate concerns for SW intervention at this time.       Bradly Bienenstock LCSWA, CHES

## 2019-03-06 ENCOUNTER — Encounter: Admit: 2019-03-06 | Discharge: 2019-03-07 | Payer: BLUE CROSS/BLUE SHIELD

## 2019-03-06 MED ORDER — BLOOD-GLUCOSE METER KIT WRAPPER
0 refills | 0 days | Status: CP
Start: 2019-03-06 — End: 2019-03-31

## 2019-03-06 MED ORDER — BLOOD SUGAR DIAGNOSTIC STRIPS
ORAL_STRIP | 8 refills | 0 days | Status: CP
Start: 2019-03-06 — End: 2019-03-31

## 2019-03-06 MED ORDER — LANCETS 28 GAUGE
8 refills | 0 days | Status: CP
Start: 2019-03-06 — End: 2019-03-31

## 2019-03-06 NOTE — Unmapped (Signed)
left a message with pre visit info.

## 2019-03-06 NOTE — Unmapped (Signed)
Assessment/Plan:      Morgan Hebert, a 33 y.o. female seen today for routine HIV followup.    Plan:  HIV  Fills ART via private insurance. Due for HMAP renewal.  ?? Continue current therapy. E-prescribed at recent visit, no refills needed.   ?? Checking HIV RNA & safety labs (brief return)    ?? Encouraged continued excellent ARV adherence. We discussed that VL is <40 but detected, reinforced daily adherence.   Lab Results   Component Value Date    ACD4 1,118 10/22/2018    CD4 54 10/22/2018    HIVCP <40 (H) 02/25/2019    HIVRS Detected (A) 02/25/2019   ?? I don't see an indication for CD4 recheck in pregnancy - only reason would be to see if she needed OI prophylaxis which is not likely given high numbers.    Constipation  ?? Discussed recommendations from OB. Trying miralax and other products.  ?? Reinforced drinking plenty of water    Pregnancy  ?? EDD 04/25/19, next appt 03/07/19  ?? VL UTD, no need to recheck CD4.   ?? Reinforced need for ART adherence.     Pseudotumor cerebri  ?? Working with neurologists, symptoms are stable  ?? For ophtho followup.    Sexual health & secondary prevention  Sex with female partner (husband). Monogamous with single partner. She does disclose status. Never uses condoms.    Lab Results   Component Value Date    RPR Nonreactive 02/05/2019    CTNAA Negative 10/11/2018    CTNAA Negative 09/26/2018    CTNAA Negative 03/01/2017    GCNAA Negative 10/11/2018    GCNAA Negative 09/26/2018    GCNAA Negative 03/01/2017    SPECTYPE Swab 10/11/2018    SPECTYPE Urine 09/26/2018    SPECTYPE Urine 03/01/2017    SPECSOURCE Endocervix 10/11/2018    SPECSOURCE Urine 09/26/2018    SPECSOURCE Urine 03/01/2017     ?? GC/CT NAATs -- performed 09/2018   ?? RPR -- NR 09/2018 - repeat 1Y      Health maintenance  Lab Results   Component Value Date    CREATININE 0.56 (L) 02/25/2019    QFTTBGOLD NEGATIVE 02/13/2014    HEPCAB Nonreactive 10/22/2018    CHOL 140 03/28/2018    HDL 42 03/28/2018    LDL 78 03/28/2018    NONHDL 98 03/28/2018    TRIG 99 03/28/2018    A1C 5.2 10/22/2018    PAP Negative for intraephithelial lesion or malignancy 03/29/2017    FINALDX  06/14/2018     A: Products of conception, removal  -Immature and hydropic chorionic villi, inflamed gestational endometrium and decidua with necrosis, most  consistent with hydropic abortus (see comment)  - No diagnostic gestational trophoblastic disease identified (correlate with molecular studies)    This electronic signature is attestation that the pathologist personally reviewed the submitted material(s) and the final diagnosis reflects that evaluation.       Communicable diseases  # TB - no longer needed; negative IGRA 2015 and low/no risk  # HCV - negative 2018; rescreen w/Ab q1-2y    Cancer screening  # Anorectal - not yet done  # Colorectal - SCREEN AGE 49+  # Liver - not yet done  # Lung - not applicable    # Breast - SCREEN AGE 5+ -- Q1-2Y  # Cervical - neg cyto June 2018 through [facility]. Due for repeat June 2020.    Cardiovascular disease  # The ASCVD Risk score Denman George DC  Montez Hageman, et al., 2013) failed to calculate.    Immunization History   Administered Date(s) Administered   ??? Influenza Vaccine Quad (IIV4 PF) 76mo+ injectable 06/30/2015, 06/15/2016, 07/04/2018   ??? TdaP 02/05/2019     ?? Screening ordered today: none  ?? Immunizations ordered today: none    I spent 20 minutes on the phone with the patient. I spent an additional 10 minutes on pre- and post-visit activities.     The patient was physically located in [place] or a state in which I am permitted to provide care. The patient and/or parent/gauardian understood that s/he may incur co-pays and cost sharing, and agreed to the telemedicine visit. The visit was completed via phone and/or video, which was appropriate and reasonable under the circumstances given the patient's presentation at the time.    The patient and/or parent/guardian has been advised of the potential risks and limitations of this mode of treatment (including, but not limited to, the absence of in-person examination) and has agreed to be treated using telemedicine. The patient's/patient's family's questions regarding telemedicine have been answered.     If the phone/video visit was completed in an ambulatory setting, the patient and/or parent/guardian has also been advised to contact their provider???s office for worsening conditions, and seek emergency medical treatment and/or call 911 if the patient deems either necessary.    Counseled as documented above regarding medication adherence.    Disposition  Return to clinic 3-4 months or sooner if needed.    [name], MD, MPH   [facility]   [address] 1st floor   [place], [place]. [address]   Phone: [phone]   Fax: [phone]      Subjective:      Chief Complaint   HIV followup    HPI  Return patient visit for Morgan Hebert, a 33 y.o. woman with now well-controlled HIV.  Pregnancy going well apart from contractions (several ED visits and evals) and constipation. EDD 04/25/19. Feels well, just tired. Excited about the baby, has equipment and carseat all set.     Past Medical History:   Diagnosis Date   ??? Abnormal mammogram    ??? Constipation    ??? Diarrhea    ??? Environmental allergies    ??? HIV (human immunodeficiency virus infection) (CMS-HCC)    ??? HIV (human immunodeficiency virus infection) (CMS-HCC)    ??? IUD (intrauterine device) in place    ??? Migraine    ??? Obesity    ??? Pseudotumor cerebri    ??? STD (sexually transmitted disease)      Medications and Allergies   Reviewed and updated today. See bottom of this visit's encounter summary for details.  Current Outpatient Medications on File Prior to Visit   Medication Sig   ??? acetaminophen (TYLENOL) 500 MG tablet Take 1,000 mg by mouth.   ??? acetaZOLAMIDE (DIAMOX) 250 MG tablet Take 2 tablets (500 mg total) by mouth Two (2) times a day.   ??? bictegrav-emtricit-tenofov ala (BIKTARVY) 50-200-25 mg tablet Take 1 tablet by mouth daily. ??? diphenhydrAMINE (BENADRYL) 25 mg capsule Take 25 mg by mouth.   ??? hydrocortisone 1 % ointment Apply topically Two (2) times a day.   ??? ketoconazole (NIZORAL) 2 % cream Apply 1 application topically daily.   ??? [EXPIRED] metroNIDAZOLE (FLAGYL) 500 MG tablet Take 1 tablet (500 mg total) by mouth two (2) times a day for 7 days.   ??? ondansetron (ZOFRAN) 4 MG tablet Take 1 tablet (  4 mg total) by mouth every 4 (four) hours for 15 days.   ??? prenatal vit no.124-iron-folic (PRENATAL VITAMIN) 27 mg iron- 800 mcg Tab Take 1 tablet by mouth.   ??? promethazine (PHENERGAN) 25 MG tablet Take 1 tablet (25 mg total) by mouth every eight (8) hours as needed for nausea (Nausea or headache).   ??? SUMAtriptan (IMITREX) 50 MG tablet Take 1 tablet (50 mg total) by mouth every two (2) hours as needed for migraine (Maximum 2 doses/day & 4 doses/week).     No current facility-administered medications on file prior to visit.      Allergies   Allergen Reactions   ??? 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     Social History  General ??? lives in [place] with her husband and her 1 daughter (born 2008). There is financial tension.   ?? Doreene Adas and his girlfriend are there sometimes as well.   ?? Mom and sister are both positive (sister is perinatally infected, patient with presumed sexual transmission from female partner).  ?? 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  Sexual History ??? sex with men (husband only)  Substance Use ??? marijuana (smokes nightly)  Social History     Tobacco Use   ??? Smoking status: Former Smoker     Types: Cigarettes   ??? Smokeless tobacco: Never Used   Substance Use Topics   ??? Alcohol use: Not Currently     Alcohol/week: 0.0 standard drinks     Comment: rare     Review of Systems  As per HPI. Remainder of 10 systems reviewed, negative.      Objective:      LMP 07/14/2018   Wt Readings from Last 3 Encounters:   02/25/19 81.3 kg (179 lb 3.2 oz)   02/12/19 81.2 kg (179 lb)   02/05/19 81.5 kg (179 lb 9.6 oz)     Phone visit    Laboratory Data  Reviewed in Epic today, using Synopsis and Chart Review filters.    Lab Results   Component Value Date    CREATININE 0.56 (L) 02/25/2019    QFTTBGOLD NEGATIVE 02/13/2014    HEPCAB Nonreactive 10/22/2018    CHOL 140 03/28/2018    HDL 42 03/28/2018    LDL 78 03/28/2018    NONHDL 98 03/28/2018    TRIG 99 03/28/2018    A1C 5.2 10/22/2018    PAP Negative for intraephithelial lesion or malignancy 03/29/2017    FINALDX  06/14/2018     A: Products of conception, removal  -Immature and hydropic chorionic villi, inflamed gestational endometrium and decidua with necrosis, most  consistent with hydropic abortus (see comment)  - No diagnostic gestational trophoblastic disease identified (correlate with molecular studies)    This electronic signature is attestation that the pathologist personally reviewed the submitted material(s) and the final diagnosis reflects that evaluation.              _____________________________________________________________________

## 2019-03-06 NOTE — Unmapped (Signed)
Attempted to call patient regarding elevated GTT 1hr of 229.  No answer; generic VM left.  Patient had a GTT 1hr of 250 on 4/28.  Patient will need to check blood sugar and appt with nutrition.

## 2019-03-06 NOTE — Unmapped (Signed)
Medical Case Management Social Work Note     Duration of Intervention: 15 minutes    SW texted pt per provider as expressed interest in therapy during phone visit with provider due to temper and issues with mother. SW found behavioral health option but pt did not like the practice as religious based. SW will find other options for pt.     Bradly Bienenstock LCSWA, CHES

## 2019-03-06 NOTE — Unmapped (Signed)
Patient returned call.  Informed patient of elevated 1 hr and the need to check her blood sugar.  Prescription sent to pharmacy for meter and supplies.  Appt tomorrow for meter teaching scheduled.  Patient verbalized understanding.

## 2019-03-06 NOTE — Unmapped (Signed)
It was great to see you today.    Contacting us   During working hours  [phone]  After hours or weekends [phone] and ask for the ID doctor on call  Fax number   [phone]    MEDICATIONS  For refills, please contact your pharmacy and ask them to electronically send or fax the request to the clinic.     Please bring all medications in original bottles to every appointment.    HMAP (formerly ADAP) or [employer] Eligibility (required even if you do not receive medication through [facility])  Please remember to renew your Juanell Fairly eligibility during renewal periods which occur twice a year: January-March and July-September.     The following are needed for each renewal:   - [facility] Identification (if you don't have one, then a bill with your name and address in [place])   - proof of income (award letter, W-2, or last three check stubs)   If you are unable to come in for renewal, let us know if we can mail, fax or e-mail paperwork to you.   HMAP Contact: [phone]      Urgent Care Clinic  Monday, Tuesday, and Thursday from 8:30 - 12 noon  Please call ahead to speak with the nursing staff if you think you need to be seen urgently!    Lab info:  Your most recent CD4 T-cell counts and viral loads are below. Here are a few things to keep in mind when looking at your numbers:  ?? For most people, we're checking CD4 counts fairly infrequently (once a year or less)  ?? It's normal for your CD4 count to be different from visit to visit.   ?? We consider your viral load to be undetectable if it says <40 or if it says Not detected.  ?? Our goal is to get your virus to be undetectable and keep it undetectable. You can help by taking your medications at about the same time, every single day. If you're having trouble with taking your medications, it's important to let us know.    YOUR RECENT LAB RESULTS:  CD4 and VIRAL LOAD  Lab Results   Component Value Date    ACD4 1,118 10/22/2018    HIVRS Detected (A) 02/25/2019

## 2019-03-07 ENCOUNTER — Encounter
Admit: 2019-03-07 | Discharge: 2019-03-08 | Payer: BLUE CROSS/BLUE SHIELD | Attending: Maternal & Fetal Medicine | Primary: Maternal & Fetal Medicine

## 2019-03-07 ENCOUNTER — Encounter: Admit: 2019-03-07 | Discharge: 2019-03-08 | Payer: BLUE CROSS/BLUE SHIELD

## 2019-03-07 DIAGNOSIS — O24419 Gestational diabetes mellitus in pregnancy, unspecified control: Principal | ICD-10-CM

## 2019-03-07 DIAGNOSIS — G932 Benign intracranial hypertension: Secondary | ICD-10-CM

## 2019-03-07 DIAGNOSIS — B20 Human immunodeficiency virus [HIV] disease: Secondary | ICD-10-CM

## 2019-03-07 DIAGNOSIS — O0990 Supervision of high risk pregnancy, unspecified, unspecified trimester: Secondary | ICD-10-CM

## 2019-03-07 DIAGNOSIS — O4703 False labor before 37 completed weeks of gestation, third trimester: Secondary | ICD-10-CM

## 2019-03-07 NOTE — Unmapped (Addendum)
I had a long discussion with the patient about mode of delivery in the setting of HIV.  Her most recent viral load on May 18 was undetectable.  We discussed that in women with plasma HIV RNA ?1000 copies/mL on antiretroviral therapy (ART), the overall incidence of transmission of HIV is low regardless of the mode of delivery (cesarean delivery versus standard vaginal delivery) or duration of membrane rupture, and a further decrease in transmission risk with cesarean delivery in such women is unclear. Thus, most experts do not typically schedule cesarean deliveries for these women, unless indicated for obstetric reasons.  All questions were asked and the patient agreed she would like to try to have vaginal delivery given her recent viral load was undetectable.  We will plan to repeat her viral load in approximately 36 weeks

## 2019-03-07 NOTE — Unmapped (Signed)
[facility]                                                                         Return Prenatal Note    Assessment/Plan:     33 y.o. [id number] at [redacted]w[redacted]d presents for a follow-up OB visit for high risk pregnancy.     GDM- diet controlled  Recent 1 hour GTT = 229.  Glucometer in mail.    Meter teaching today.  Scheduled video visit for next week to review her blood sugars.    HIV  I had a long discussion with the patient about mode of delivery in the setting of HIV.  Her most recent viral load on May 18 was undetectable.  We discussed that in women with plasma HIV RNA ?1000 copies/mL on antiretroviral therapy (ART), the overall incidence of transmission of HIV is low regardless of the mode of delivery (cesarean delivery versus standard vaginal delivery) or duration of membrane rupture, and a further decrease in transmission risk with cesarean delivery in such women is unclear. Thus, most experts do not typically schedule cesarean deliveries for these women, unless indicated for obstetric reasons.  All questions were asked and the patient agreed she would like to try to have vaginal delivery given her recent viral load was undetectable.  We will plan to repeat her viral load in approximately 36 weeks      Supervision of high risk pregnancy, unspecified, unspecified trimester  The patient is without complaints today.  She denies contractions, leaking, or bleeding.    Pseudotumor cerebri  I discussed with patient that mode of delivery should be per routine obstetric care; intrapartum anesthesia can be per routine obstetric care.  She would like to try to have a vaginal delivery if possible and would like access to an epidural.  Given her pseudotumor cerebri, we have ordered an OB anesthesia virtual consult.    Threatened premature labor in third trimester  Denies signs or symptoms of preterm labor      Return in about 1 week (around 03/14/2019).        Subjective:      33 y.o. [id number] at [redacted]w[redacted]d presents for follow-up OB visit for high risk pregnancy. Patient denies contractions, leakage of fluid, or vaginal bleeding.     Objective:     Flowsheet Vitals:  Temp: 35.8 ??C (96.5 ??F)  Heart Rate: 112  BP: 127/82  Urine Glucose: Negative  Urine Protein: Negative  Movement: Present  Loss of Fluid: No  Bleeding: No  Contractions: Irregular  Fundal Height (cm): 33 cm  Fetal Heart Rate: 135  Total weight gain: 1.86 kg (4 lb 1.6 oz)    General Appearance:  No acute distress, well appearing, and well nourished  Pulmonary:   Normal work of breathing  Cardiovascular:  Normal heart rate      Edema: Trace.  Gastrointestinal:  Abdomen soft, gravid, no rebound or guarding  Genitourinary:   SVE Deferred      Musculoskeletal:  Normal gait, grossly normal range of motion  Neurologic:   Alert and oriented to person, place, and time  Psychiatric:   Mood and affect within normal limits  Integumentary :  Skin is warm, dry, no rash  noted      I spent 25 minutes in face-to-face communication with Ms. Northrup, greater than 50% of which was in counseling and education as above.       Florencia Reasons, MD  _____________________________________________________________________

## 2019-03-07 NOTE — Unmapped (Signed)
I discussed with patient that mode of delivery should be per routine obstetric care; intrapartum anesthesia can be per routine obstetric care.  She would like to try to have a vaginal delivery if possible and would like access to an epidural.  Given her pseudotumor cerebri, we have ordered an OB anesthesia virtual consult.

## 2019-03-07 NOTE — Unmapped (Addendum)
Recent 1 hour GTT = 229.  Glucometer in mail.    Meter teaching today.  Scheduled video visit for next week to review her blood sugars.

## 2019-03-07 NOTE — Unmapped (Signed)
Denies signs or symptoms of preterm labor

## 2019-03-07 NOTE — Unmapped (Signed)
Patient instructed on home glucometer use today in clinic. Patient given [place] glucose logs with monitoring schedule and ranges. Instructed patient to call with any questions or concerns. Patient performed successful teach back demonstration of glucometer use and care, answered patient's questions.

## 2019-03-07 NOTE — Unmapped (Signed)
The patient is without complaints today.  She denies contractions, leaking, or bleeding.

## 2019-03-08 NOTE — Unmapped (Signed)
Medical Case Management Social Work Note     Duration of Intervention: 5 minutes    SW texted pt to see if she was able to research list of therapists given yesterday. No response yet.      Bradly Bienenstock LCSWA, CHES

## 2019-03-15 NOTE — Unmapped (Signed)
Patient is a 33 y.o. 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 She reports that she was with her godmother on Friday (May 29), who tested positive for COVID yesterday (June 10). She reports her godmother was asymptomatic and got tested due to a COVID exposure herself. The patient denies fever, chills, aches, cough, shortness of breath, loss of taste/smell. Discussed that I will send message to [facility] to arrange testing. Patient advised to call back if any symptoms develop. Patient voiced understanding.    Rip Harbour, MD  OB/GYN  PGY1

## 2019-03-15 NOTE — Unmapped (Signed)
Patient was called to schedule [facility] Appointment for 03/15/19    Did patient confirm appointment? No    If No, what action was taken?     Voicemail left / Recruitment consultant

## 2019-03-18 ENCOUNTER — Encounter: Admit: 2019-03-18 | Discharge: 2019-03-19 | Payer: BLUE CROSS/BLUE SHIELD | Attending: Family | Primary: Family

## 2019-03-18 ENCOUNTER — Encounter
Admit: 2019-03-18 | Discharge: 2019-03-19 | Payer: BLUE CROSS/BLUE SHIELD | Attending: Obstetrics & Gynecology | Primary: Obstetrics & Gynecology

## 2019-03-18 ENCOUNTER — Telehealth: Admit: 2019-03-18 | Discharge: 2019-03-19 | Payer: BLUE CROSS/BLUE SHIELD

## 2019-03-18 DIAGNOSIS — B20 Human immunodeficiency virus [HIV] disease: Secondary | ICD-10-CM

## 2019-03-18 DIAGNOSIS — G932 Benign intracranial hypertension: Principal | ICD-10-CM

## 2019-03-18 DIAGNOSIS — Z283 Underimmunization status: Secondary | ICD-10-CM

## 2019-03-18 DIAGNOSIS — O24419 Gestational diabetes mellitus in pregnancy, unspecified control: Principal | ICD-10-CM

## 2019-03-18 DIAGNOSIS — O0990 Supervision of high risk pregnancy, unspecified, unspecified trimester: Secondary | ICD-10-CM

## 2019-03-18 DIAGNOSIS — O9989 Other specified diseases and conditions complicating pregnancy, childbirth and the puerperium: Secondary | ICD-10-CM

## 2019-03-18 DIAGNOSIS — Z20828 Contact with and (suspected) exposure to other viral communicable diseases: Principal | ICD-10-CM

## 2019-03-18 NOTE — Unmapped (Signed)
Provider: Jacklynn Ganong, MD  Division: Maternal-Fetal Medicine    Virtual Video Encounter  Return Prenatal Note      I spent 25 minutes on the phone with the patient. I spent an additional 10 minutes on pre- and post-visit activities.     The patient was physically located in [place] or a state in which I am permitted to provide care. The patient and/or parent/gauardian understood that s/he may incur co-pays and cost sharing, and agreed to the telemedicine visit. The visit was completed via phone and/or video, which was appropriate and reasonable under the circumstances given the patient's presentation at the time.    The patient and/or parent/guardian has been advised of the potential risks and limitations of this mode of treatment (including, but not limited to, the absence of in-person examination) and has agreed to be treated using telemedicine. The patient's/patient's family's questions regarding telemedicine have been answered.     If the phone/video visit was completed in an ambulatory setting, the patient and/or parent/guardian has also been advised to contact their provider???s office for worsening conditions, and seek emergency medical treatment and/or call 911 if the patient deems either necessary.         Assessment/Plan:     Plan   33 y.o. [id number] at [redacted]w[redacted]d presents for a follow-up OB visit for high risk pregnancy.     GDM- diet controlled  Pt reports that her blood sugar log is in her car, does not have it at this time; however does recall some recent sugars which she was able to report today. The patient did not get meter until Monday 6/1 (1 week of checking sugars). Reports that they have overall been within range. She did not read instructions correctly and only checked fasting sugar today and has not been checking otherwise.     Fasting: 75   Breakfast: 118 (today), highest reported is 125   Lunch: no lunch   Dinner: 120, 108    Method of taking blood sugars and importance of documentation was again reviewed at length today. We reviewed the importance of blood sugar control for best maternal and neonatal outcomes. We reviewed risks of uncontrolled diabetes as well.     Plan:   - continue blood sugar checks fasting and 1 hr PP   - plan to check in re: blood sugars in 1 week via mychart, in person visit on 6/23 (2 weeks)     Pseudotumor cerebri  Pt reports symptoms are well controlled.   Pt has anesthesia consult today at 11 AM. Message sent via mychart today to remind patient of this appt as she was not aware of it during video call today.         HIV  Last VL was negative. Repeat viral load in person at 36 weeks.   Has ID follow up scheduled for 6/24.   Discussed mode of delivery - NSVD. We will plan to schedule IOL at next in person visit.       Rubella non-immune status, antepartum  Plan for PP MMR     Supervision of high risk pregnancy, unspecified, unspecified trimester  Up to date with prenatal care.   GBS at next visit  Growth [place] at 36 weeks   FKC reviewed   Plan for delivery 38-39 weeks given HIV + status    Recent COVID exposure:   Patient called on call pager with news of recent pager (see note from 6/5) but missed [facility] appt. Exposure on  5/29 with patient that later tested pos on 6/10. I have sent msg to [facility] to guide need for testing. The patient was recommended quarantine for at least 14 days from time of exposure. We reviewed covid symptoms and reasons to call.     Follow up: No follow-ups on file.   Follow-up requests from provider: cancel visit on 6/15. In person visit with Kristell Wooding on 6/23 with [place] if COVID testing neg.   Patient will call the clinic or use MyChart should anything change or any new issues arise.      Subjective     33 y.o. [id number] at [redacted]w[redacted]d presents for this Virtual Video follow-up OB visit for high risk pregnancy. Patient reports normal fetal movement and denies , leakage of fluid, or vaginal bleeding; moderate ctx present.        Objective:   (Physical Examination (to extent possible); Laboratory and Test Data (as available))    The following portions of the patient's history were reviewed and updated as appropriate: allergies, current medications, past obstetric history, past family history, past medical history, past social history, past surgical history and problem list    General Appearance No acute distress, well appearing, and well nourished.   Pulmonary Normal work of breathing.       Gastronintestinal Gravid in appearance        Muskuloskeletal Normal gait, grossly normal range of motion   Neurologic Alert and oriented to person, place, and time   Psychiatric   Mood and affect within normal limits       Medical Decision Making:     25 minutes was spent on this medically necessary service. Pt visit by Virtual Video in the setting of State of Emergency due to COVID-19 Pandemic.     Jacklynn Ganong, MD

## 2019-03-18 NOTE — Unmapped (Signed)
-  Plan for PP MMR

## 2019-03-18 NOTE — Unmapped (Signed)
-----   Message from [facility] [place], [place] sent at 03/18/2019  9:19 AM EDT -----  Regarding: RE: patient for referral  Adding the [facility] schedulers who are the ones who schedule the appointments.    ----- Message -----  From: Colin Benton, MD  Sent: 03/18/2019   9:02 AM EDT  To: , #  Subject: patient for referral                             Hello,     This patient was exposed and is high risk given pregnancy at 34 weeks, HIV positive.   The patient was with godmother on 5/29, who was asymptomatic at that time. Her godmother subsequently tested positive on 6/10 and informed patient, fever to 103. The patient is asymptomatic at this time.     She was called by [facility] on 6/5 and missed call. Can we please call again to schedule testing? The patient is anticipating a call with guidance.     Thank you,   Keenan Bachelor MD

## 2019-03-18 NOTE — Unmapped (Addendum)
Pt reports symptoms are well controlled.   Pt has anesthesia consult today at 11 AM. Message sent via mychart today to remind patient of this appt as she was not aware of it during video call today.

## 2019-03-18 NOTE — Unmapped (Signed)
Thank you for choosing [facility].   You will be getting a call from the [facility] to schedule testing.   You will be getting a call from our schedulers for the following:   - anesthesia consult   - appt on 6/23 with Dr. Kathrynn Speed   - Ultrasound on 6/23     Please let us know if you have questions or concerns.

## 2019-03-18 NOTE — Unmapped (Signed)
Up to date with prenatal care.   GBS at next visit  Growth [place] at 36 weeks   FKC reviewed   Plan for delivery 38-39 weeks given HIV + status

## 2019-03-18 NOTE — Unmapped (Signed)
Last VL was negative. Repeat viral load in person at 36 weeks.   Has ID follow up scheduled for 6/24.   Discussed mode of delivery - NSVD. We will plan to schedule IOL at next in person visit.

## 2019-03-18 NOTE — Unmapped (Addendum)
Pt reports that her blood sugar log is in her car, does not have it at this time; however does recall some recent sugars which she was able to report today. The patient did not get meter until Monday 6/1 (1 week of checking sugars). Reports that they have overall been within range. She did not read instructions correctly and only checked fasting sugar today and has not been checking otherwise.     Fasting: 75   Breakfast: 118 (today), highest reported is 125   Lunch: no lunch   Dinner: 120, 108    Method of taking blood sugars and importance of documentation was again reviewed at length today. We reviewed the importance of blood sugar control for best maternal and neonatal outcomes. We reviewed risks of uncontrolled diabetes as well.     Plan:   - continue blood sugar checks fasting and 1 hr PP   - plan to check in re: blood sugars in 1 week via mychart, in person visit on 6/23 (2 weeks)

## 2019-03-18 NOTE — Unmapped (Signed)
Patient was called to schedule COVID testing for 03/18/19    Did patient confirm appointment? Yes    Morgan Hebert

## 2019-03-18 NOTE — Unmapped (Signed)
Assessment     Morgan Hebert is a 33 y.o. female presenting to [facility] for COVID testing.     Research consent form signed to be contacted about future related research?  No research services were conducted with this visit: Yes    Plan     If no testing performed, pt counseled on routine care for respiratory illness.  If testing performed, COVID sent.  Patient directed to Home given findings during today's visit.    Subjective     Morgan Hebert is a 33 y.o. female who [facility] with complaints of the following:    Exposure History: In the last 14 days?     Have you traveled outside of [place]? No               Have you been in close contact with someone confirmed by a test to have COVID? (Close contact is within 6 feet for at least 10 minutes) Yes, Who? Not Stated       Have you worked in a health care facility? No     Do you live in a facility like a nursing home, group home, or assisted living?    No         Are you scheduled to receive chemotherapy within the next 7 days? No               Have you ever been tested before for COVID-19 with a swab of your nose?     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                     Symptoms (as stated by patient on intake form):  Do you currently have any of the following:  Subjective fever (felt feverish) No   Chills (especially repeated shaking chills) No   Severe fatigue (felt very tired) No   Muscle aches No   Runny nose No   Sore throat No   Loss of taste or smell No   Cough (new onset or worsening of chronic cough) No   Shortness of breath No   Nausea or vomiting No   Headache No   Abdominal Pain No   Diarrhea (3 or more loose stools in last 24 hours) No     History/Medical Conditions (as stated by patient on intake form):    Do you have any of the following:   Asthma or emphysema or COPD No   Cystic Fibrosis No Diabetes No   High Blood Pressure  No   Cardiovascular Disease No   Chronic Kidney Disease No   Chronic Liver Disease No   Chronic blood disorder like Sickle Cell Disease  No   Weak immune system due to disease or medication No   Neurologic condition that limits movement  No   Developmental delay - Moderate to Severe  No   Recent (within past 2 weeks) or current Pregnancy Yes, 3rd Trimester   Morbid Obesity (>100 pounds over ideal weight) Yes   Current Smoker No   Former Smoker Yes       Objective     Given above, testing performed: Yes    Testing Performed:  Test Specimen Type Sent to   COVID-19  NP Swab [facility]       Scribe's Attestation: Riann Oman L. Lorin Picket,  FNP-c obtained and performed the history, physical exam and medical decision making elements that were entered into the chart.  Signed by Selinda Eon, PT serving as Scribe, on 03/18/2019 3:03 PM      The documentation recorded by the scribe accurately reflects the service I personally performed and the decisions made by me. Aida Puffer, FNP  March 18, 2019 3:39 PM

## 2019-03-20 NOTE — Unmapped (Signed)
[facility] Refill Coordination Note    Specialty Medication(s) to be Shipped:   Infectious Disease: Biktarvy    Other medication(s) to be shipped: N/A     Morgan Hebert, DOB: [birth year]  Phone: [phone] (home) [phone] (work)      All above HIPAA information was verified with patient.     Completed refill call assessment today to schedule patient's medication shipment from the [facility] [phone]).       Specialty medication(s) and dose(s) confirmed: Regimen is correct and unchanged.   Changes to medications: [name] reports no changes at this time.  Changes to insurance: No  Questions for the pharmacist: No    Confirmed patient received Welcome Packet with first shipment. The patient will receive a drug information handout for each medication shipped and additional FDA Medication Guides as required.       DISEASE/MEDICATION-SPECIFIC INFORMATION        N/A    SPECIALTY MEDICATION ADHERENCE     Medication Adherence    Patient reported X missed doses in the last month:  0  Specialty Medication:  BIKTARVY                PATIENT WAS IN CAR, AND NOT WITH MEDICATIONS- UNSURE OF ACTUAL QUANTITY ON HAND.        SHIPPING     Shipping address confirmed in Epic.     Delivery Scheduled: Yes, Expected medication delivery date: 6/12.     Medication will be delivered via Next Day Courier to the home address in Epic WAM.    [name]   [facility] Specialty Technician

## 2019-03-21 MED FILL — BIKTARVY 50 MG-200 MG-25 MG TABLET: 30 days supply | Qty: 30 | Fill #4 | Status: AC

## 2019-03-21 MED FILL — BIKTARVY 50 MG-200 MG-25 MG TABLET: ORAL | 30 days supply | Qty: 30 | Fill #4

## 2019-03-28 ENCOUNTER — Encounter
Admit: 2019-03-28 | Discharge: 2019-03-31 | Disposition: A | Discharge status: Home / Self Care | Payer: BLUE CROSS/BLUE SHIELD

## 2019-03-28 ENCOUNTER — Ambulatory Visit
Admit: 2019-03-28 | Discharge: 2019-03-31 | Disposition: A | Discharge status: Home / Self Care | Payer: BLUE CROSS/BLUE SHIELD

## 2019-03-28 LAB — HEMATOCRIT: Lab: 36.7

## 2019-03-28 LAB — CBC
HEMATOCRIT: 36.7 % (ref 36.0–46.0)
HEMOGLOBIN: 12.1 g/dL (ref 12.0–16.0)
MEAN CORPUSCULAR HEMOGLOBIN CONC: 32.9 g/dL (ref 31.0–37.0)
MEAN CORPUSCULAR HEMOGLOBIN: 29.5 pg (ref 26.0–34.0)
MEAN PLATELET VOLUME: 11.7 fL — ABNORMAL HIGH (ref 7.0–10.0)
RED BLOOD CELL COUNT: 4.09 10*12/L (ref 4.00–5.20)
RED CELL DISTRIBUTION WIDTH: 16.2 % — ABNORMAL HIGH (ref 12.0–15.0)

## 2019-03-28 NOTE — Unmapped (Signed)
03/28/19    Travel Screening Questions Completed.    Travel Screening Questions/Answers:  1). Have you traveled out of [place] within the last 14 days?: No  2). Do you have any of the following symptoms that are new or worsening: cough, shortness of breath, loss of taste/smell, sore throat, fever or feeling feverish, repeated shaking chills, muscle pain, vomiting, or diarrhea?: No  3). Have you had close contact with a person with confirmed COVID-19 in the last 21 days before symptoms began?: No  4). Have you tested positive in the last 21 days for COVID-19?: No      Negative Travel Screen: Patient answered NO to questions 2, 3, and 4. Offer Telephone visit, Virtual Visit or In Person Visit according to the current scheduling protocol for your clinic.       The call was handled in the following manner: Scheduled for Telephone Visit

## 2019-03-29 DIAGNOSIS — O368130 Decreased fetal movements, third trimester, not applicable or unspecified: Principal | ICD-10-CM

## 2019-03-29 LAB — PROTEIN/CREAT RATIO, URINE: Protein/Creatinine:MRto:Pt:Urine:Qn:: 0

## 2019-03-29 LAB — CBC
HEMATOCRIT: 33.3 % — ABNORMAL LOW (ref 36.0–46.0)
HEMOGLOBIN: 10.8 g/dL — ABNORMAL LOW (ref 12.0–16.0)
MEAN CORPUSCULAR HEMOGLOBIN CONC: 32.4 g/dL (ref 31.0–37.0)
MEAN CORPUSCULAR HEMOGLOBIN: 29.5 pg (ref 26.0–34.0)
MEAN CORPUSCULAR VOLUME: 91.1 fL (ref 80.0–100.0)
MEAN PLATELET VOLUME: 11.2 fL — ABNORMAL HIGH (ref 7.0–10.0)
PLATELET COUNT: 167 10*9/L (ref 150–440)
RED BLOOD CELL COUNT: 3.66 10*12/L — ABNORMAL LOW (ref 4.00–5.20)
RED CELL DISTRIBUTION WIDTH: 15.8 % — ABNORMAL HIGH (ref 12.0–15.0)

## 2019-03-29 LAB — CREATININE
Creatinine:MCnc:Pt:Ser/Plas:Qn:: 0.69
EGFR CKD-EPI AA FEMALE: >90 mL/min/{1.73_m2} (ref >=60)
EGFR CKD-EPI NON-AA FEMALE: >90 mL/min/{1.73_m2} (ref >=60)

## 2019-03-29 LAB — SYPHILIS RPR SCREEN: Reagin Ab:PrThr:Pt:Ser:Ord:RPR: NONREACTIVE

## 2019-03-29 LAB — PROTEIN / CREATININE RATIO, URINE
CREATININE, URINE: 58.3 mg/dL
PROTEIN URINE: <4 mg/dL

## 2019-03-29 LAB — ALT (SGPT): Alanine aminotransferase:CCnc:Pt:Ser/Plas:Qn:: 8

## 2019-03-29 LAB — AST (SGOT): Aspartate aminotransferase:CCnc:Pt:Ser/Plas:Qn:: 15

## 2019-03-29 LAB — LACTATE DEHYDROGENASE: Lactate dehydrogenase:CCnc:Pt:Ser/Plas:Qn:: 279 — ABNORMAL LOW

## 2019-03-29 LAB — MEAN CORPUSCULAR HEMOGLOBIN CONC: Lab: 32.4

## 2019-03-29 NOTE — Unmapped (Signed)
Took over on patient from triage and moved to room 12. Discussed POC with patient and spouse, MD present. Call bell and safety discussed. Plan to start place foley bulb and start pitocin. Will continue to monitor.

## 2019-03-29 NOTE — Unmapped (Signed)
Labor & Delivery History and Physical    ASSESSMENT AND PLAN     Morgan Hebert is a 33 y.o. [id number] at [redacted]w[redacted]d with EDD: 04/25/2019, by -/9 presenting with decreased fetal movement. NST non-reactive with baseline of 120, minimal to moderate variability, no accels and no decels. BPP by Dr. Pernell Dupre 4/8 (-2 movement and tone). I completed a BSUS for 5 minutes following Dr. Jarvis Morgan BPP and visualized no evidence of fetal movement or tone. Given the patient's GA and abnormal testing, decision made to proceed with  delivery. Plan for CST and possible IOL. I discussed this plan with the patient in detail. We discussed the increased risk of c/s for fetal intolerance of labor, and I consented her for an emergency c/s as below.     Decreased Fetal Movement with BPP 4/10  - Non-reactive NST with minimal to moderate variability, no accels, no decels   - BPP 4/10; -2 movement (only one movement visualized), -2 tone, -2 NST  - Plan for CST and possible IOL     HIV   - Maintained on Biktarvy   - Followed by [facility]   - Last VL on 5/18 <40; previously undetectable   - While VL likely remains <50, plan for Zidovudine gtt as unable to repeat VL prior to delivery. I discussed the risks and benefits of zidovudine with the patient and she agrees with this plan   - No FSE, will avoid early amniotomy and ensure that pediatrics is aware     Pseudotumor cerebri   - Has daily migraines with photosensitivity and nausea   - Current regimen: Diamox 500mg  qd, zofran and tylenol prn    - Followed by [facility] neurology with LV 4/15  - Grade I b/l papilledema   - S/p OB anesthesia consult on 6/8: Plan for early CSE and possible drainage of CSF prior to administering spinal/epidural dose    GDMA1  - FS per protocol  - Insulin as needed     FWB  - cephalic presentation by BSUS  - EFW: 5.5# by leopolds   - Continuous fetal monitoring  - FHT currently Cat I/II  - GBS unknown; will give PCN as preterm   - S/p BMZ 4/22-4/23 in the setting of r/o PTL at [place] Postpartum Hemorrhage Risk:   - PPH Risk Score: 0 (03/28/19 2230)  - Low risk (0) - standard practices.    Postpartum:  - Vaccines: Tdap [x]   - Contraception: plans undecided  - Prenatal Care Provider: [facility]     Migdalia Dk, M.D., M.Phil   Clinical Instructor & Fellow  Department of Obstetrics and Gynecology  Division of Maternal-Fetal Medicine         HPI     Chief Complaint: Decreased fetal movement     Morgan Hebert is a 33 y.o. [id number] F at [redacted]w[redacted]d who presents for decreased fetal movement. The patient has not felt any fetal movement since 12:30pm. On ROS, the patient endorses intermittent painful contractions and nausea. Denies leakage of fluid and vaginal bleeding. ROS otherwise negative.     Pregnancy Complications  Patient Active Problem List   Diagnosis   ??? Pseudotumor cerebri   ??? HIV   ??? Lymphadenopathy   ??? Supervision of high risk pregnancy, unspecified, unspecified trimester   ??? Encounter for procreative genetic counseling   ??? Rubella non-immune status, antepartum   ??? Threatened premature labor in third trimester   ??? GDM- diet controlled  Review of Systems  A twelve point review of systems was negative except as stated in HPI.     HISTORY     Medications  Medications Prior to Admission   Medication Sig Dispense Refill Last Dose   ??? acetaminophen (TYLENOL) 500 MG tablet Take 1,000 mg by mouth.   03/27/2019 at 2300   ??? acetaZOLAMIDE (DIAMOX) 500 mg capsule Take 500 mg by mouth.   03/27/2019 at 2300   ??? bictegrav-emtricit-tenofov ala (BIKTARVY) 50-200-25 mg tablet Take 1 tablet by mouth daily. 30 tablet 11 03/27/2019 at 2300   ??? diphenhydrAMINE (BENADRYL) 25 mg capsule Take 25 mg by mouth.   03/27/2019 at 2300   ??? prenatal vit no.124-iron-folic (PRENATAL VITAMIN) 27 mg iron- 800 mcg Tab Take 1 tablet by mouth.   03/27/2019 at 2300   ??? promethazine (PHENERGAN) 25 MG tablet Take 1 tablet (25 mg total) by mouth every eight (8) hours as needed for nausea (Nausea or headache). 90 tablet 2 03/27/2019 at 2300   ??? blood sugar diagnostic (FREESTYLE LITE STRIPS) Strp Check blood sugar 4 times per day 100 strip 8    ??? blood-glucose meter kit Use as instructed 1 each 0    ??? hydrocortisone 1 % ointment Apply topically Two (2) times a day. (Patient not taking: Reported on 03/07/2019) 30 g 11 Not Taking at Unknown time   ??? ketoconazole (NIZORAL) 2 % cream Apply 1 application topically daily. (Patient not taking: Reported on 03/07/2019) 15 g 11 Not Taking at Unknown time   ??? lancets (FREESTYLE) 28 gauge Misc Check blood sugar 4 times per day 100 each 8    ??? [EXPIRED] metroNIDAZOLE (FLAGYL) 500 MG tablet Take 1 tablet (500 mg total) by mouth two (2) times a day for 7 days. 14 tablet 0 Taking   ??? [EXPIRED] ondansetron (ZOFRAN) 4 MG tablet Take 1 tablet (4 mg total) by mouth every 4 (four) hours for 15 days. 30 tablet 2 03/07/2019 at Unknown time   ??? SUMAtriptan (IMITREX) 50 MG tablet Take 1 tablet (50 mg total) by mouth every two (2) hours as needed for migraine (Maximum 2 doses/day & 4 doses/week). (Patient not taking: Reported on 03/07/2019) 16 tablet 2 Not Taking at Unknown time       Allergies  is allergic to peanut.     OB History  OB History   Gravida Para Term Preterm AB Living   11 1 1  0 8 1   SAB TAB Ectopic Molar Multiple Live Births   7 0 1 0 0 1      # Outcome Date GA Lbr Len/2nd Weight Sex Delivery Anes PTL Lv   11 Current            10 SAB 06/2018           9 SAB 2013           8 Ectopic 2013           7 SAB 2012           6 SAB 2012           5 SAB 2011           4 SAB 2010           3 SAB 2010           2 Term 06/28/06   2693 g (5 lb 15 oz) F Vag-Spont  N LIV   1 [place]  Past Medical History  Past Medical History:   Diagnosis Date   ??? Abnormal mammogram    ??? Constipation    ??? Diarrhea    ??? Environmental allergies    ??? GDM- diet controlled 03/07/2019   ??? HIV (human immunodeficiency virus infection) (CMS-HCC)    ??? HIV (human immunodeficiency virus infection) (CMS-HCC)    ??? IUD (intrauterine device) in place    ??? Migraine    ??? Obesity    ??? Pseudotumor cerebri    ??? STD (sexually transmitted disease)        Past Surgical History  Past Surgical History:   Procedure Laterality Date   ??? DILATION AND CURETTAGE OF UTERUS     ??? LUMBAR PUNCTURE     ??? PR COLONOSCOPY W/BIOPSY SINGLE/MULTIPLE  07/03/2014    Procedure: COLONOSCOPY, FLEXIBLE, PROXIMAL TO SPLENIC FLEXURE; WITH BIOPSY, SINGLE OR MULTIPLE;  Surgeon: Billie Ruddy, MD;  Location: [facility] PROC[facility]URES [facility];  Service: Gastroenterology   ??? PR DILATION/CURETTAGE,DIAGNOSTIC N/A 06/14/2018    Procedure: DILATION AND CURETTAGE, DIAGNOSTIC AND/OR THERAPEUTIC (NON OBSTETRICAL);  Surgeon: Nelle Don, MD;  Location: [facility];  Service: [facility] Primary Gynecology   ??? PR UPPER GI ENDOSCOPY,BIOPSY N/A 07/03/2014    Procedure: UGI ENDOSCOPY; WITH BIOPSY, SINGLE OR MULTIPLE;  Surgeon: Billie Ruddy, MD;  Location: [facility] PROC[facility];  Service: Gastroenterology   ??? WISDOM TOOTH EXTRACTION         Social History   reports that she has quit smoking. Her smoking use included cigarettes. She has never used smokeless tobacco. She reports previous alcohol use. She reports previous drug use. Drug: Marijuana.     Family History  family history includes Asthma in her mother; Breast cancer in her maternal grandmother; Cancer in her maternal grandmother; Cervical cancer in her mother; Diabetes in her maternal aunt and mother; Hypertension in her maternal aunt and mother; No Known Problems in her daughter.     PHYSICAL EXAM     Vitals:    03/28/19 2022   BP: 116/84   Pulse: 110   Resp: 22   Temp: 36.6 ??C   TempSrc: Oral   SpO2: 100%   Weight: 81.1 kg (178 lb 12.8 oz)   Height: 149.9 cm (4' 11)       Constitutional: No acute distress.   Cardiovascular: Normal rate.    Pulmonary/Chest: Normal work of breathing.   Abdominal: Soft, gravid, nontender to palpation.  Skin: Skin is warm and dry. No rash noted.   Genitourinary: Normal external female genitalia. SSE: NEFG, normal appearing vaginal mucosa without lesions, no pooling, no vaginal bleeding, cervix visually C/L/P  SVE: Dilation: Closed  Effacement (%): Thick  Station: High  OB Examiner: Pernell Dupre, [place] Resident    BSUS: vertex, BPP 4/8 (-2 tone, -2 movement), MVP 6cm   PRENATAL & ENCOUNTER LABS     Results in Past 360 Days  Result Component Current Result   Blood Type AB POS (02/12/2019)   Antibody Screen NEG (02/12/2019)   Rubella IgG Scr Negative (05/30/2018)   Varicella IgG Positive (05/30/2018)   RPR Nonreactive (02/05/2019)   Gonorrhoeae NAA Negative (10/11/2018)   Chlamydia trachomatis, NAA Negative (10/11/2018)   O'Sullivan Glucose 1 hr 229 (H) (03/05/2019)       Results for orders placed or performed during the hospital encounter of 03/28/19   POCT Glucose   Result Value Ref Range    Glucose, POC 89 70 - 179 mg/dL

## 2019-03-29 NOTE — Unmapped (Signed)
Order canceled

## 2019-03-29 NOTE — Unmapped (Signed)
Cesarean Section Operative Note    Date: March 29, 2019    Procedure:   1. Primary cesarean section via low transverse uterine incision    Pre-operative Diagnosis:   1. [redacted]w[redacted]d intrauterine pregnancy  2. NRFHT remote from delivery    3. GDMA1, HIV+ (VL <40 on 5/18), Pseudotumor cerebri     Post-Operative Diagnosis: same; delivered     Attending Surgeon: Asher Muir, MD    Resident Surgeon: Clide Cliff, PGY4    Assistants: None    Anesthesia: Epidural;Spinal     Operative Findings:  1. female infant in vertex presentation, APGARs and BW pending per newborn nursery   2. Normal bilateral fallopian tubes and ovaries   3. Normal Uterus and uterine cavity    Estimated Blood Loss:  175cc    Urine Output: 100 mL of clear yellow urine     IV Fluids: 1000 mL crystalloid    Drains: Foley to gravity           Specimens:   1. Placenta To pathology  2. Cord gases Collected and sent           Complications:  None           Disposition: PACU - hemodynamically stable.           Indications: CHI WOODHAM is a 33 y.o. [id number] at [redacted]w[redacted]d with an EDD of 04/25/2019, by Ultrasound who was admitted for abnormal fetal testing (BPP 4/10) after presenting with decreased fetal movement. The risks, benefits, complications, and expected outcomes of cesarean section were discussed with the patient on admission. All consents were signed and witnessed. The patient agreed to accept blood products if necessary. Decision made to proceed with CST and possible IOL. Pitocin was initiated. EFM with minimal to moderate variability, rare 10x10 accels, no decels. A foley balloon was placed, and pitocin was uptitrated. At approximately 4:45am minimal variability noted with recurrent decels. Minimal improvement following resuscitative measures, and so a cesarean section was called for NRFHT remote from delivery.      Procedure   The patient was taken to the operating room where epidural anesthesia was found to be adequate. The patient was placed in the supine position with a left lateral tilt.  A Foley catheter had been previously placed. The patient was prepped and draped in the routine sterile fashion. A low transverse skin incision was made with a scalpel and carried through to the underlying fascia.The fascia was incised in the midline sharply with the scalpel and the fascial incision was extended laterally with Mayo scissors. The superior aspect of the fascial incision was grasped with two Kocher clamps, elevated, and the underlying muscle was dissected off both bluntly and sharply using Mayo scissors. This process was repeated on the inferior aspect.      The rectus muscles were divided in the midline and the peritoneum was entered using blunt dissection. The peritoneal incision was extended superiorly and inferiorly. Next, the operative hand was used to determine the position of the uterus, which was noted to be levorotated. A bladder flap was not created The bladder blade was then placed.  Next, a low transverse uterine incision was made and carried down sharply until the amniotic membranes were ruptured. Clear fluid was noted.  The incision was then extended bluntly.  The fetal head was grasped, flexed and delivered through the incision. A viable female infant was delivered with findings as noted above. Delayed cord clamping was performed for 30 seconds then  the cord was doubly clamped and cut.. The infant was handed off to the awaiting nurse. At this time, a specimen of cord blood was obtained and oxytocin was added to the IV fluid. The placenta was then delivered with external uterine massage .     The uterus was exteriorized and draped with a moist lap. The endometrial cavity was wiped clean with a dry lap to ensure that no placental fragments remained. The incision was inspected and noted to be free of extensions.     The incision was closed with a running locking 0-Vicryl suture. Next, a second imbricating layer was placed to obtain hemostasis. The uterus was returned to the abdomen. The incision was confirmed to be hemostatic.    The paracolic gutters were then cleansed with a moist lap. The peritoneum was not reapproximated. The subfascial space was confirmed to be hemostatic. The fascia was reapproximated using 0-Vicryl in a single non-locking layer. The subcutaneous tissues were made hemostatic with electrocautery and 3-0 Vicryl. The skin was closed with 4-0 monocryl suture.      The patient tolerated the procedure well.  All counts were correct x3, and the patient was transferred to the recovery room in good condition. Prophylactic cefazolin was administered prior to skin incision.    Pregnancy risk: The risk of the pregnancy was low.    Attending Attestation: Dr. Val Riles was present for key portions of this procedure.

## 2019-03-30 LAB — CBC
HEMATOCRIT: 31.5 % — ABNORMAL LOW (ref 36.0–46.0)
HEMOGLOBIN: 10.2 g/dL — ABNORMAL LOW (ref 12.0–16.0)
MEAN CORPUSCULAR HEMOGLOBIN CONC: 32.4 g/dL (ref 31.0–37.0)
MEAN CORPUSCULAR HEMOGLOBIN: 29.5 pg (ref 26.0–34.0)
MEAN PLATELET VOLUME: 11.2 fL — ABNORMAL HIGH (ref 7.0–10.0)
PLATELET COUNT: 155 10*9/L (ref 150–440)
RED BLOOD CELL COUNT: 3.47 10*12/L — ABNORMAL LOW (ref 4.00–5.20)
WBC ADJUSTED: 15.2 10*9/L — ABNORMAL HIGH (ref 4.5–11.0)

## 2019-03-30 LAB — RED CELL DISTRIBUTION WIDTH: Lab: 16.1 — ABNORMAL HIGH

## 2019-03-30 NOTE — Unmapped (Signed)
Pt taught to report signs and symptoms of PPH such as large clots, hypotension, dizziness or vaginal bleeding. Pt encouraged to ambulate as tolerated and ask for assistance if needed.  Pt given pain medications to assist with incisional discomfort and pain.        Problem: Adult Inpatient Plan of Care  Goal: Plan of Care Review  Outcome: Progressing  Flowsheets (Taken 03/30/2019 0528)  Plan of Care Reviewed With: patient     Problem: Adult Inpatient Plan of Care  Goal: Optimal Comfort and Wellbeing  Outcome: Progressing  Intervention: Monitor Pain and Promote Comfort  Flowsheets (Taken 03/30/2019 0528)  Pain Management Interventions:   around-the-clock dosing utilized   care clustered   diversional activity provided   pain management plan reviewed with patient/caregiver   quiet environment facilitated  Intervention: Provide Person-Centered Care  Flowsheets (Taken 03/30/2019 0528)  Trust Relationship/Rapport:   care explained   choices provided   questions answered   questions encouraged   emotional support provided   empathic listening provided     Problem: Adjustment to Role Transition (Postpartum Cesarean Delivery)  Goal: Successful Maternal Role Transition  Outcome: Progressing  Intervention: Support Maternal Role Transition  Flowsheets (Taken 03/30/2019 0528)  Supportive Measures:   verbalization of feelings encouraged   positive reinforcement provided   self-care encouraged   decision-making supported   active listening utilized     Problem: Pain (Postpartum Cesarean Delivery)  Goal: Acceptable Pain Control  Outcome: Progressing  Intervention: Prevent or Manage Pain  Flowsheets (Taken 03/30/2019 0528)  Pain Management Interventions:   around-the-clock dosing utilized   care clustered   diversional activity provided   pain management plan reviewed with patient/caregiver   quiet environment facilitated     Problem: Postoperative Urinary Retention (Postpartum Cesarean Delivery)  Goal: Effective Urinary Elimination Outcome: Progressing  Intervention: Monitor and Manage Urinary Retention  Flowsheets (Taken 03/30/2019 0528)  Urinary Elimination Promotion:   frequent voiding encouraged   voiding relaxation promoted   toileting offered

## 2019-03-30 NOTE — Unmapped (Signed)
Post-Cesarean Section Progress Note    ASSESSMENT AND PLAN     Morgan Hebert is a 33 y.o. [id number] POD#1 s/p C-Section, Low Transverse  for nonreactive fetal heart tones. Procedure was uncomplicated and patient is now progressing towards postoperative goals.    Postoperative Care:   - Preop H/H 10.8/33.3 > EBL 175 mL > POD#1 10.2/31.5  - UOP adequate; Foley out POD#1, voiding spontaneously  - Pain well-controlled on PO pain meds  - Advised to ambulate and on pain management and incision care    HIV   - Maintained on Biktarvy   - Followed by [facility] ID   - Last VL on 5/18 <40; previously undetectable     Pseudotumor cerebri   - Has daily migraines with photosensitivity and nausea   - Current regimen: Diamox 500mg  qd, zofran and tylenol prn ??  - Followed by [facility] neurology with LV 4/15  - Grade I b/l papilledema   - Currently asymptomatic    A1GDM  - FBG PPD#1 69    PNC:  - Feeding: formula  - Contraception: undecided, will f/u  - AB POS / Rubella non-immune (MMR ordered) / Varicella immune / TDaP UTD / Flu [ ]    - Patient will follow up at MFM for postpartum care in 4 weeks.    PPX: OOB, SCDs    Plan discussed with Attending Dr. Ethelene Browns, who is in agreement.     SUBJECTIVE     The patient does not have complaints  Pain is well controlled with current medications.  Baby is feeding via formula. Foley has been removed and patient is urinating without difficulty.  She is ambulating without difficulty.    Mood is reported as good.    OBJECTIVE     Temp:  [36.5 ??C (97.7 ??F)-36.6 ??C (97.9 ??F)] 36.5 ??C (97.7 ??F)  Heart Rate:  [81-113] 83  Resp:  [16-20] 20  BP: (94-132)/(68-98) 117/87  MAP (mmHg):  [77-109] 90  SpO2:  [95 %-100 %] 97 %    I/O this shift:  In: -   Out: 400 [Urine:400]    General: NAD  CV: RRR, Normal rate   Pulm: CTAB, Normal work of breathing   Abd: Soft, appropriately tender, fundus firm and non-tender  Incision: clean, dry, intact with dressing in place  Fundus Fundal Tone: Firm   Lochia Amount: Light   Perineum Appearance: Intact       LABS     Lab Results   Component Value Date    WBC 15.2 (H) 03/30/2019    HGB 10.2 (L) 03/30/2019    HCT 31.5 (L) 03/30/2019    PLT 155 03/30/2019       Immunizations:   Immunization History   Administered Date(s) Administered   ??? Influenza Vaccine Quad (IIV4 PF) 92mo+ injectable 06/30/2015, 06/15/2016, 07/04/2018   ??? TdaP 02/05/2019     Scribe's Attestation: Ashok Croon, MD obtained and performed the history, physical exam and medical decision making elements that were entered into the chart. Documentation assistance was provided by me personally, a scribe. Signed by Lonia Chimera, Scribe, on March 30, 2019 at 5:18 AM.         ----------------------------------------------------------------------------------------------------------------------  March 30, 2019 10:40 AM. Documentation assistance provided by the Scribe. I was present during the time the encounter was recorded. The information recorded by the Scribe was done at my direction and has been reviewed and validated by me.  ----------------------------------------------------------------------------------------------------------------------

## 2019-03-30 NOTE — Unmapped (Addendum)
Morgan Hebert is a 33 y.o. [id number] admitted on 03/28/2019 for induction of labor secondary to nonreactive fetal heart tones. Due to non-reassuring fetal heart tones remote from delivery, the decision was made to proceed with cesarean delivery. The risks and benefits of surgery were discussed and the patient consented for a cesarean section delivery. She delivered on 03/29/19 at [redacted]w[redacted]d and the cesarean section operation was uncomplicated. Her Hct decreased from 33.3 to 21.5 on POD#1 with an EBL of 175. Her pregnancy and postpartum course were complicated by the following:    HIV  Patient managed on Biktarvy and followed by [facility]. Last viral load on 5/18 was <40 and had previously been undetectable. She was scheduled to follow-up with ID on 04/03/19.     A1GDM    Patient's fasting blood glucose on postpartum day 1 was 69.     Pseudotumor cerebri  Patient with grade I bilateral papilledema and was followed by [facility] Neurology. She had daily migraines with photosensitivity and nausea. Patient was managed on scheduled Diamox and Zofran and Tylenol as needed.     During the postpartum period she met all of her postpartum goals including pain control by oral medications, limited vaginal bleeding, tolerating a regular diet, ambulating well, voiding spontaneously, and passing flatus. She was discharged home on POD#2 in stable condition. She was counseled on routine postpartum care and to follow-up with her OB in 4 weeks.

## 2019-03-31 MED ORDER — POLYETHYLENE GLYCOL 3350 17 GRAM ORAL POWDER PACKET
PACK | Freq: Every day | ORAL | 0 refills | 0.00000 days | Status: CP
Start: 2019-03-31 — End: 2019-06-04

## 2019-03-31 MED ORDER — ACETAMINOPHEN 325 MG TABLET
ORAL_TABLET | Freq: Four times a day (QID) | ORAL | 0 refills | 0.00000 days | Status: CP
Start: 2019-03-31 — End: ?

## 2019-03-31 MED ORDER — SENNOSIDES 8.6 MG-DOCUSATE SODIUM 50 MG TABLET
ORAL_TABLET | Freq: Every day | ORAL | 0 refills | 0.00000 days | Status: CP
Start: 2019-03-31 — End: 2019-04-30

## 2019-03-31 MED ORDER — IBUPROFEN 600 MG TABLET
ORAL_TABLET | Freq: Four times a day (QID) | ORAL | 0 refills | 0 days | Status: SS
Start: 2019-03-31 — End: 2019-06-04

## 2019-03-31 MED ORDER — OXYCODONE 5 MG TABLET
ORAL_TABLET | Freq: Four times a day (QID) | ORAL | 0 refills | 0.00000 days | Status: CP | PRN
Start: 2019-03-31 — End: 2019-04-07
  Filled 2019-04-02: qty 120, 15d supply, fill #0

## 2019-03-31 MED ORDER — LIDOCAINE 5 % TOPICAL PATCH
MEDICATED_PATCH | TRANSDERMAL | 0 refills | 0 days | Status: CP
Start: 2019-03-31 — End: 2019-03-31

## 2019-03-31 NOTE — Unmapped (Signed)
Obstetrics Discharge Summary    Admit Date: 03/28/2019    Discharge Date and Time: 03/31/2019    Discharge To: Home    Discharge Attending Physician: Hilario Quarry, MD     Delivery Type: C-Section, Low Transverse     Gestational Age at Delivery:   Information for the patient's newborn:  Morgan, Hebert [[id number]]   Gestational Age: [redacted]w[redacted]d      Delivery Clinician: Nancy Fetter WEST     Delivery Anesthesia: Epidural;Spinal     Labor Complications: Fetal Intolerance     Lacerations:       Other Procedures: None     Postpartum Complications: None    Hospital Problems Addressed During this Hospitalization:  Active Problems:    Pseudotumor cerebri    HIV    Supervision of high risk pregnancy, unspecified, unspecified trimester    GDM- diet controlled    Birth Control Planned at Discharge: BTL at postpartum appointment (preop appointment for [facility] Planning requested)     Infant Feeding Method at Discharge: L&D Feeding Options: formula    Hospital Course:   Morgan Hebert is a 33 y.o. [id number] admitted on 03/28/2019 for induction of labor secondary to nonreactive fetal heart tones. Due to non-reassuring fetal heart tones remote from delivery, the decision was made to proceed with cesarean delivery. The risks and benefits of surgery were discussed and the patient consented for a cesarean section delivery. She delivered on 03/29/19 at [redacted]w[redacted]d and the cesarean section operation was uncomplicated. Her Hct decreased from 33.3 to 21.5 on POD#1 with an EBL of 175. Her pregnancy and postpartum course were complicated by the following:    HIV  Patient managed on Biktarvy and followed by [facility] Infectious Disease. Last viral load on 5/18 was <40 and had previously been undetectable. She was scheduled to follow-up with ID on 04/03/19.     A1GDM    Patient's fasting blood glucose on postpartum day 1 was 69.     Pseudotumor cerebri  Patient with grade I bilateral papilledema and was followed by [facility]. She had daily migraines with photosensitivity and nausea. Patient was managed on scheduled Diamox and Zofran and Tylenol as needed.     During the postpartum period she met all of her postpartum goals including pain control by oral medications, limited vaginal bleeding, tolerating a regular diet, ambulating well, voiding spontaneously, and passing flatus. She was discharged home on POD#2 in stable condition. She was counseled on routine postpartum care and to follow-up with her OB in 4 weeks.      Day of Discharge Services:  Patient was seen and examined by the primary team and deemed stable for discharge.   BP 143/85  - Pulse 85  - Temp 36.6 ??C (Oral)  - Resp 18  - Ht 149.9 cm (4' 11)  - Wt 80.7 kg (178 lb)  - LMP 07/14/2018  - SpO2 97%  - Breastfeeding Yes  - BMI 35.95 kg/m??   General: No acute distress  Respiratory: Normal work of breathing  Abdomen: soft, non-distended, fundus firm below the umbilicus, nontender to palpation  Incision: clean/dry/intact  GU: minimal vaginal bleeding  Extremities: no calf pain bilaterally, no edema bilaterally    Condition at Discharge: good    Pertinent Labs:  HELLP   No results found for requested labs within last 2 days.   [  CBC   Results in Past 2 Days  Result Component Current Result   WBC 15.2 (H) (  03/30/2019)   RBC 3.47 (L) (03/30/2019)   HCT 31.5 (L) (03/30/2019)   HGB 10.2 (L) (03/30/2019)   Platelet 155 (03/30/2019)   MCV 91.0 (03/30/2019)   MCH 29.5 (03/30/2019)   MCHC 32.4 (03/30/2019)   RDW 16.1 (H) (03/30/2019)       Lab Results   Component Value Date    WBC 15.2 (H) 03/30/2019    HGB 10.2 (L) 03/30/2019    HCT 31.5 (L) 03/30/2019    PLT 155 03/30/2019       Invalid input(s): POCTBG    Pending Test Results:   Pending Labs     Order Current Status    Group B Strep Screen In process    Surgical pathology exam In process          Discharge Medications:      Your Medication List      STOP taking these medications    blood sugar diagnostic Strp  Commonly known as:  FREESTYLE LITE STRIPS blood-glucose meter kit     diphenhydrAMINE 25 mg capsule  Commonly known as:  BENADRYL     hydrocortisone 1 % ointment     ketoconazole 2 % cream  Commonly known as:  NIZORAL     lancets 28 gauge Misc  Commonly known as:  freestyle     metroNIDAZOLE 500 MG tablet  Commonly known as:  FLAGYL     ondansetron 4 MG tablet  Commonly known as:  ZOFRAN     promethazine 25 MG tablet  Commonly known as:  PHENERGAN        START taking these medications    ibuprofen 600 MG tablet  Commonly known as:  MOTRIN  Take 1 tablet (600 mg total) by mouth Every six (6) hours.     oxyCODONE 5 MG immediate release tablet  Commonly known as:  ROXICODONE  Take 1 tablet (5 mg total) by mouth every six (6) hours as needed for pain for up to 5 days.     polyethylene glycol 17 gram packet  Commonly known as:  MIRALAX  Mix 1 packet (17 g) with 4 to 8 ounces of liquid and take by mouth daily.     senna-docusate 8.6-50 mg  Commonly known as:  sennosides-docusate sodium  Take 1 tablet by mouth daily.        CHANGE how you take these medications    acetaminophen 325 MG tablet  Commonly known as:  TylenoL  Take 2 tablets (650 mg total) by mouth Every six (6) hours.  What changed:    ?? medication strength  ?? how much to take  ?? when to take this        CONTINUE taking these medications    acetaZOLAMIDE 500 mg capsule  Commonly known as:  DIAMOX  Take 500 mg by mouth.     BIKTARVY 50-200-25 mg tablet  Generic drug:  bictegrav-emtricit-tenofov ala  Take 1 tablet by mouth daily.     PRENATAL VITAMIN 27 mg iron- 800 mcg Tab  Generic drug:  prenatal vit no.124-iron-folic  Take 1 tablet by mouth.     SUMAtriptan 50 MG tablet  Commonly known as:  IMITREX  Take 1 tablet (50 mg total) by mouth every two (2) hours as needed for migraine (Maximum 2 doses/day & 4 doses/week).             Immunizations:  Immunization History   Administered Date(s) Administered   ??? Influenza Vaccine Quad (IIV4 PF) 94mo+ injectable 06/30/2015,  06/15/2016, 07/04/2018   ??? MMR 03/31/2019   ??? PNEUMOCOCCAL POLYSACCHARIDE 23 03/31/2019   ??? TdaP 02/05/2019       Discharge Instructions:  Activity Instructions     Activity as tolerated          Diet Instructions     Discharge diet (specify)      Discharge Nutrition Therapy:  General        Other Instructions     Additional follow-up (specify)      Postpartum visit in four weeks.         Call MD for:  difficulty breathing, headache or visual disturbances      Call MD for:  persistent dizziness or light-headedness      Call MD for:  persistent nausea or vomiting      Call MD for:  severe uncontrolled pain      Call MD for:  temperature >38.5 Celsius      Call MD for:  vaginal bleeding saturating more than 1 pad per hour.      Discharge instructions      You may take tylenol 650mg  every 6 hours and alternate with ibuprofen 600mg  every 6 hours for pain. You may take oxycodone 5mg  every 4 hours as needed for more severe pain. You may pick up lidocaine patches over the counter. You can use these around your incision and replace them every 12 hours    Please keep your follow up appointment with your infectious disease doctor.     We will arrange for follow up in 4-6 weeks for a postpartum appointment. If you do not receive a call in 1 week, please call the clinic number (see below). We will also arrange for a preoperative appointment in the family planning clinic for a postpartum tubal ligation. You may call the same clinic number if you do not hear from them in 1 week.     Please call the [facility] Ob[facility]yn clinic at [phone] during business hours. After hours, holidays and weekends call the operator and ask for the OB resident on-call at [phone] for any of the following:    Vaginal bleeding that is soaking through one or more maxi pads in an hour  Fever of 100.73F or higher  Overwhelming feelings of sadness or anxiety that are not relieved with rest  Foul-smelling discharge from vagina  Very bad headache that is not relieved with motrin or tylenol Abdominal pain that is not relieved with rest, mortrin and percocet  Signs of a breast infection: red streaks or redness on the breast, fever and/or muscle aches.  Pain with urination  Difficulty with bowel movements  Drainage of blood or yellow discharge (pus) at incision site (if cesarean section)    Please be aware of any of the following signs or symptoms of preeclampsia or eclampsia (pregnancy induced hypertension), and see an emergency care physician if you start to experience the following:   severe blood pressures >150 / >100  severe headache  severe upper abdominal pain   new swelling of your hands and feet   decreased urine output   seizures    What Can I Expect in the First Few Months After the Baby Comes?    New mothers and their families face many challenges in the first few months:  Your body and your hormones have to get back to normal.  You and the baby may be learning to breastfeed.  Babies only sleep a few hours at a time. The whole  new family will have a hard time getting enough sleep.  You and your family need to learn how to parent this new family member.  If you have a partner, you have to figure out how to stay together as a couple.  You may have to figure out how to keep from getting pregnant again right away.  You may need to return to work and find day care.    How Long Will It Take for My Body to Get Back to Normal?    Some changes occur quickly. Others will not happen so quickly.  Your uterus, cervix and vagina will all shrink to their normal, nonpregnant size in about 2 weeks. Your vagina may be tender and dry for a few months-especially if you are breastfeeding. If you had stitches or hemorrhoids, your ''bottom'' will be sore for 2 weeks or more. For some women who have problems urinating, it can take several months for you to be able to hold your urine when you cough or sneeze or suddenly pick up something heavy.    Your breast milk will ''come in'' 2 to 3 days after the birth of your baby. It will take 6 to 8 weeks for youand the baby to get the hang of breastfeeding and find a pattern. During these first weeks, you can have engorged breasts at times and often leak milk.  Your stomach and intestines all have to fall back into place. You may have a lot of gas for a few weeks.    You may be constipated-especially if you are breastfeeding.  Your stretched stomach muscles can recover in a few weeks, but for some women it takes longer (6 months to 1 year) to recover.    Losing the weight you gained during pregnancy will probably take 6 months to a year.     The skin at the incision site may be numb for up to 6 months after your surgery.     What Can I Expect When My Hormones Change?    About 75% of all women will get the ''blues.'' This usually starts about 3 days after the birth of your baby. Having an unplanned cesarean or a baby that is sick are risk factors for postpartum depression.  You may cry easily and feel very, very tired. A few women become very depressed.   Call the office right away if you cannot care for yourself or your baby, if you feel very nervous or worried, if you cannot stop crying, or if you are having thoughts of hurting yourself or your baby.    Taking Care of Yourself    Ask for help. Let other people do the cooking and cleaning and run the house. Focus on yourself and your baby.    Do not do any heavy lifting. Do not go up and down stairs more than a few times a day.    Sleep whenever you can. Try not to be tempted to ''get some things done'' when the baby sleeps. This is your time to sleep, too.    Drink lots of water. You will need at least 6 big glasses of water every day to avoid constipation and make enough breast milk. Every time you sit down to breastfeed, have a big glass of water with you to drink while you are nursing.    Eat lots of vegetables and fruit. You will need lots of vitamins and fiber to help your body get back to normal. This will also  help you avoid constipation.    Go outside and walk. Babies can go outside even if it is very cold. Fresh air and sunshine will do you bothgood.    Keep your nipples clean and dry. Rinse your nipples after each feeding and let them air dry.      Take sitz baths. Put about 6 inches of warm water in your bathtub and sit in there for 15 minutes 2 or 3 times a day. This will help your ''bottom'' heal more quickly. It will also give you 15 minutes of private time!    Nothing should be placed in the vagina for 6 weeks: no intercourse, tampons or douching.    Talk to other mothers. Join a new parents group. Call [facility] and go to their meetings if you are breastfeeding. Other support group options:    With your partner    Keep talking. Share the experience.  Spend time alone. Even a 30-minute walk can be a date.    You can become pregnant even before your first period. Be sure to use birth control if you have intercourse.             Follow Up instructions and Outpatient Referrals     Ambulatory referral to Gynecology      Call MD for:  difficulty breathing, headache or visual disturbances      Call MD for:  persistent dizziness or light-headedness      Call MD for:  persistent nausea or vomiting      Call MD for:  severe uncontrolled pain      Call MD for:  temperature >38.5 Celsius      Call MD for:  vaginal bleeding saturating more than 1 pad per hour.      Discharge instructions          Appointments which have been scheduled for you    Apr 03, 2019  3:00 PM EDT  (Arrive by 2:30 PM)  PHONE with Billy Coast, MD  [facility] INFECTIOUS DISEASES [facility]) [address]  [place] [address]  [phone]   Please DO NOT come to the clinic for this visit. We will call you to discuss your plan of care.           Scribe's Attestation: Ronnette Hila, MD obtained and performed the history, physical exam and medical decision making elements that were entered into the chart. Documentation assistance was provided by me personally, a scribe. Signed by Lorenda Cahill, Scribe, on March 31, 2019 at 11:30 AM.         ----------------------------------------------------------------------------------------------------------------------  March 31, 2019 5:05 PM. Documentation assistance provided by the Scribe. I was present during the time the encounter was recorded. The information recorded by the Scribe was done at my direction and has been reviewed and validated by me.  ----------------------------------------------------------------------------------------------------------------------

## 2019-03-31 NOTE — Unmapped (Signed)
Hello,     This patient is being discharged today, POD2 from cesarean section. Please schedule her for a postpartum visit with [facility] MFM in 4-6 weeks, as well as a preop appointment with Family Planning clinic for postpartum BTL in 2-4 weeks.     Thank you!   Stephone Gum

## 2019-03-31 NOTE — Unmapped (Addendum)
Plan of care reviewed with patient. Assessment and VS WNL. Patient voiding. Minimal bleeding. Pain 8/10, refused ice packs. Given oxycodone, lidocaine patch, tylenol, and ibuprofen. Scheduled pain medications given/appropriate pain management. Visiting infant in [facility], [facility], [facility] often. Denies current needs. Safety maintained.      Problem: Adult Inpatient Plan of Care  Goal: Plan of Care Review  Outcome: Progressing  Goal: Optimal Comfort and Wellbeing  Outcome: Progressing     Problem: Adjustment to Role Transition (Postpartum Cesarean Delivery)  Goal: Successful Maternal Role Transition  Outcome: Progressing     Problem: Bleeding (Postpartum Cesarean Delivery)  Goal: Hemostasis  Outcome: Progressing

## 2019-03-31 NOTE — Unmapped (Signed)
Plan of care reviewed with patient. Patient states understanding of plan of care.  Vital signs stable. Postpartum checks within defined limits.  Fundus is firm and midline. Bleeding is rubra and light. Patient reports no clots or sudden large gushes of blood.  Patient encouraged to ambulate. Patient able to ambulate the hall with with no difficulties. Gait is steady with no dizziness.  Pain has been managed through medication (see MAR) and ice packs. Will continue to monitor and meet needs of patient and family

## 2019-03-31 NOTE — Unmapped (Signed)
Vital signs stable & afebrile. Abdominal incision with pressure dressing clean, dry & intact. Fundus firm with scant lochia. Pain manageable with scheduled & prn medication. Patient visiting newborn in NICU via wheelchair with RN & ambulated back to the floor pushing wheelchair. Adjusting to maternal role transition. Plan of care reviewed with patient. Hydration, rest & sleep encouraged.

## 2019-03-31 NOTE — Unmapped (Signed)
Post-Cesarean Section Progress Note    ASSESSMENT AND PLAN     Morgan Hebert is a 33 y.o. [id number] POD#2 s/p C-Section, Low Transverse  for nonreactive fetal heart tones. Procedure was uncomplicated and patient is now progressing towards postoperative goals.    Postoperative Care:   - Preop H/H 10.8/33.3 > EBL 175 mL > POD#1 10.2/31.5  - UOP adequate; Foley out POD#1, voiding spontaneously  - Pain well-controlled on PO pain meds   - Advised to ambulate and on pain management and incision care    HIV   - Maintained on Biktarvy   - Followed by [facility] ID > f/u appointment on 6/24  - Last VL on 5/18 <40; previously undetectable     Pseudotumor cerebri   - Has daily migraines with photosensitivity and nausea   - Current regimen: Diamox 500mg  BID, zofran and tylenol prn ??  - Followed by [facility] neurology with LV 4/15  - Grade I b/l papilledema   - Currently asymptomatic    A1GDM  - FBG PPD#1 69    PNC:  - Feeding: formula  - Contraception: undecided, will f/u  - AB POS / Rubella non-immune (MMR ordered) / Varicella immune / TDaP UTD / Flu [ ]    - Patient will follow up at MFM for postpartum care in 4 weeks.    PPX: OOB, SCDs    Plan discussed with Attending Dr. Ethelene Browns, who is in agreement.     SUBJECTIVE     The patient pain is moderately- well controlled with current medications.  Baby is feeding via formula. Foley has been removed and patient is urinating without difficulty.  She is ambulating without difficulty.  Tolerating PO without nausea/vomiting. Would like to discuss contraception later today. Ok with possible discharge today.     OBJECTIVE     Temp:  [36.9 ??C (98.4 ??F)-37 ??C (98.6 ??F)] 36.9 ??C (98.4 ??F)  Heart Rate:  [87] 87  Resp:  [18-20] 18  BP: (120-122)/(82-84) 122/84    No intake/output data recorded.    General: NAD  CV: RRR, Normal rate   Pulm: CTAB, Normal work of breathing   Abd: Soft, appropriately tender, fundus firm and non-tender  Incision: clean, dry, intact with dressing in place, some serosanguinous drainage   Fundus Fundal Tone: Firm   Lochia Amount: Scant   Extremities: no edema bilaterally     LABS     Lab Results   Component Value Date    WBC 15.2 (H) 03/30/2019    HGB 10.2 (L) 03/30/2019    HCT 31.5 (L) 03/30/2019    PLT 155 03/30/2019       Immunizations:   Immunization History   Administered Date(s) Administered   ??? Influenza Vaccine Quad (IIV4 PF) 1mo+ injectable 06/30/2015, 06/15/2016, 07/04/2018   ??? TdaP 02/05/2019     Scribe's Attestation: Ronnette Hila, MD obtained and performed the history, physical exam and medical decision making elements that were entered into the chart. Documentation assistance was provided by me personally, a scribe. Signed by Lorenda Cahill, Scribe, on March 31, 2019 at 6:16 AM.         ----------------------------------------------------------------------------------------------------------------------  March 31, 2019 7:12 AM. Documentation assistance provided by the Scribe. I was present during the time the encounter was recorded. The information recorded by the Scribe was done at my direction and has been reviewed and validated by me.  ----------------------------------------------------------------------------------------------------------------------

## 2019-04-02 MED FILL — OXYCODONE 5 MG TABLET: 5 days supply | Qty: 20 | Fill #0 | Status: AC

## 2019-04-02 MED FILL — ACETAMINOPHEN 325 MG TABLET: 15 days supply | Qty: 120 | Fill #0 | Status: AC

## 2019-04-02 MED FILL — IBUPROFEN 600 MG TABLET: 15 days supply | Qty: 60 | Fill #0 | Status: AC

## 2019-04-02 MED FILL — OXYCODONE 5 MG TABLET: ORAL | 5 days supply | Qty: 20 | Fill #0

## 2019-04-02 MED FILL — IBUPROFEN 600 MG TABLET: ORAL | 15 days supply | Qty: 60 | Fill #0

## 2019-04-02 NOTE — Unmapped (Signed)
Called pt to do pre-visit telephone rooming for Phone visit. Pt expressed no concerns at this time. PT completed rooming process.

## 2019-04-03 ENCOUNTER — Encounter: Admit: 2019-04-03 | Discharge: 2019-04-04 | Payer: BLUE CROSS/BLUE SHIELD

## 2019-04-03 NOTE — Unmapped (Signed)
It was great to talk to you today. Congratulations on the baby!    It is important that you contact the OB clinic nurse line just to make sure that nothing more worrisome is going on with your swelling.    Contacting us   During working hours  [phone]  After hours or weekends [phone] and ask for the ID doctor on call  Fax number   [phone]    MEDICATIONS  For refills, please contact your pharmacy and ask them to electronically send or fax the request to the clinic.     Please bring all medications in original bottles to every appointment.    [facility] (formerly [facility]) or [employer] Eligibility (required even if you do not receive medication through [facility])  Please remember to renew your [employer]ell Fairly eligibility during renewal periods which occur twice a year: January-March and July-September.     The following are needed for each renewal:   - [facility] Identification (if you don't have one, then a bill with your name and address in [place])   - proof of income (award letter, W-2, or last three check stubs)   If you are unable to come in for renewal, let us know if we can mail, fax or e-mail paperwork to you.   [facility] Contact: [phone]      Urgent Care Clinic  Monday, Tuesday, and Thursday from 8:30 - 12 noon  Please call ahead to speak with the nursing staff if you think you need to be seen urgently!    Lab info:  Your most recent CD4 T-cell counts and viral loads are below. Here are a few things to keep in mind when looking at your numbers:  ?? For most people, we're checking CD4 counts fairly infrequently (once a year or less)  ?? It's normal for your CD4 count to be different from visit to visit.   ?? We consider your viral load to be undetectable if it says <40 or if it says Not detected.  ?? Our goal is to get your virus to be undetectable and keep it undetectable. You can help by taking your medications at about the same time, every single day. If you're having trouble with taking your medications, it's important to let us know.    YOUR RECENT LAB RESULTS:  CD4 and VIRAL LOAD  Lab Results   Component Value Date    ACD4 1,118 10/22/2018    HIVRS Detected (A) 02/25/2019

## 2019-04-03 NOTE — Unmapped (Signed)
Assessment/Plan:      Morgan Hebert, a 33 y.o. female called today for routine HIV followup.    Plan:  S/p Caesarean section 03/29/19  ?? Discussed checking in with [facility] nurse triage re her LE swelling. I am a bit concerned that it is not getting better.  ?? Offered support around birth of daughter, reinforced clinic resources  ?? Discussed followup needs for daughter with Dr. Alfredo Hebert.  ?? Considering BTL, reluctant to pursue hormonal contraception of any type.    HIV  Fills ART via private insurance. RW renewal is current for this period.  ?? Continue current therapy. E-prescribed at recent visit, no refills needed.   ?? No labs today    ?? Encouraged continued excellent ARV adherence.   Lab Results   Component Value Date    ACD4 1,118 10/22/2018    CD4 54 10/22/2018    HIVCP <40 (H) 02/25/2019    HIVRS Detected (A) 02/25/2019   ??     Pseudotumor cerebri  ?? Working with neurologists, symptoms are stable  ?? For ophtho followup.    Sexual health & secondary prevention  Sex with female partner (husband). Monogamous with single partner. She does disclose status. Never uses condoms.    Lab Results   Component Value Date    RPR Nonreactive 03/28/2019    CTNAA Negative 03/28/2019    CTNAA Negative 10/11/2018    CTNAA Negative 09/26/2018    GCNAA Negative 03/28/2019    GCNAA Negative 10/11/2018    GCNAA Negative 09/26/2018    SPECTYPE Swab 03/28/2019    SPECTYPE Swab 10/11/2018    SPECTYPE Urine 09/26/2018    SPECSOURCE Endocervix 03/28/2019    SPECSOURCE Endocervix 10/11/2018    SPECSOURCE Urine 09/26/2018     ?? GC/CT NAATs -- negative from cervical swab 03/2019   ?? RPR -- NR 03/2019 - repeat 1Y      Health maintenance  Lab Results   Component Value Date    CREATININE 0.69 03/29/2019    QFTTBGOLD NEGATIVE 02/13/2014    HEPCAB Nonreactive 10/22/2018    CHOL 140 03/28/2018    HDL 42 03/28/2018    LDL 78 03/28/2018    NONHDL 98 03/28/2018    TRIG 99 03/28/2018    A1C 5.2 10/22/2018    PAP Negative for intraephithelial lesion or malignancy 03/29/2017    FINALDX  06/14/2018     A: Products of conception, removal  -Immature and hydropic chorionic villi, inflamed gestational endometrium and decidua with necrosis, most  consistent with hydropic abortus (see comment)  - No diagnostic gestational trophoblastic disease identified (correlate with molecular studies)    This electronic signature is attestation that the pathologist personally reviewed the submitted material(s) and the final diagnosis reflects that evaluation.       Communicable diseases  # TB - no longer needed; negative IGRA 2015 and low/no risk  # HCV - negative 2018; rescreen w/Ab q1-2y    Cancer screening  # Anorectal - not yet done  # Colorectal - SCREEN AGE 59+  # Liver - not yet done  # Lung - not applicable    # Breast - SCREEN AGE 72+ -- Q1-2Y  # Cervical - neg cyto June 2018 through [facility]. Due for repeat June 2020.    Cardiovascular disease  # The ASCVD Risk score Denman George DC Jorge Ny al., 2013) failed to calculate.    Immunization History   Administered Date(s) Administered   ??? Influenza Vaccine Quad (IIV4 PF) 48mo+  injectable 06/30/2015, 06/15/2016, 07/04/2018   ??? MMR 03/31/2019   ??? PNEUMOCOCCAL POLYSACCHARIDE 23 03/31/2019   ??? TdaP 02/05/2019     ?? Screening ordered today: none  ?? Immunizations ordered today: none    I spent 20 minutes on the phone with the patient. I spent an additional 10 minutes on pre- and post-visit activities.     The patient was physically located in [place] or a state in which I am permitted to provide care. The patient and/or parent/gauardian understood that s/he may incur co-pays and cost sharing, and agreed to the telemedicine visit. The visit was completed via phone and/or video, which was appropriate and reasonable under the circumstances given the patient's presentation at the time.    The patient and/or parent/guardian has been advised of the potential risks and limitations of this mode of treatment (including, but not limited to, the absence of in-person examination) and has agreed to be treated using telemedicine. The patient's/patient's family's questions regarding telemedicine have been answered.     If the phone/video visit was completed in an ambulatory setting, the patient and/or parent/guardian has also been advised to contact their provider???s office for worsening conditions, and seek emergency medical treatment and/or call 911 if the patient deems either necessary.    Counseled as documented above regarding medication adherence.    Disposition  Return to clinic 3-4 months or sooner if needed.    Morgan Bristol, MD, MPH   [facility]   [address] 1st floor   [place], [place]. [address]   Phone: [phone]   Fax: [phone]      Subjective:      Chief Complaint   HIV followup    HPI  Return patient visit for Morgan Hebert, a 33 y.o. woman with now well-controlled HIV.  Delivered daughter Morgan Hebert on 6/19 via urgent c-section. The baby is still in the NICU - this is hard as she lives 45 minutes away.   Overall doing well with self-care, still in a lot of pain from incision. Eating well, able to have daily BMs.   Notes that her feet and lower legs are very swollen - more so than when she was pregnant. Not really going away since hospitalization.     Past Medical History:   Diagnosis Date   ??? Abnormal mammogram    ??? Constipation    ??? Diarrhea    ??? Environmental allergies    ??? GDM- diet controlled 03/07/2019   ??? HIV (human immunodeficiency virus infection) (CMS-HCC)    ??? HIV (human immunodeficiency virus infection) (CMS-HCC)    ??? IUD (intrauterine device) in place    ??? Migraine    ??? Obesity    ??? Pseudotumor cerebri    ??? STD (sexually transmitted disease)      Medications and Allergies   Reviewed and updated today. See bottom of this visit's encounter summary for details.  Current Outpatient Medications on File Prior to Visit   Medication Sig   ??? acetaminophen (TYLENOL) 325 MG tablet Take 2 tablets (650 mg total) by mouth Every six (6) hours.   ??? acetaZOLAMIDE (DIAMOX) 500 mg capsule Take 500 mg by mouth.   ??? bictegrav-emtricit-tenofov ala (BIKTARVY) 50-200-25 mg tablet Take 1 tablet by mouth daily.   ??? ibuprofen (MOTRIN) 600 MG tablet Take 1 tablet (600 mg total) by mouth Every six (6) hours.   ??? oxyCODONE (ROXICODONE) 5 MG immediate release tablet Take 1 tablet (5 mg total) by mouth  every six (6) hours as needed for pain for up to 5 days.   ??? prenatal vit no.124-iron-folic (PRENATAL VITAMIN) 27 mg iron- 800 mcg Tab Take 1 tablet by mouth.   ??? SUMAtriptan (IMITREX) 50 MG tablet Take 1 tablet (50 mg total) by mouth every two (2) hours as needed for migraine (Maximum 2 doses/day & 4 doses/week).   ??? polyethylene glycol (MIRALAX) 17 gram packet Mix 1 packet (17 g) with 4 to 8 ounces of liquid and take by mouth daily. (Patient not taking: Reported on 04/02/2019)   ??? senna-docusate (SENNOSIDES-DOCUSATE SODIUM) 8.6-50 mg Take 1 tablet by mouth daily. (Patient not taking: Reported on 04/02/2019)     No current facility-administered medications on file prior to visit.      Allergies   Allergen Reactions   ??? 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     Social History  General ??? lives in [place] with her husband and her 1 daughter (born 2008). There is financial tension.   ?? Doreene Adas and his girlfriend are there sometimes as well.   ?? Mom and sister are both positive (sister is perinatally infected, patient with presumed sexual transmission from female partner).  ?? 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  Sexual History ??? sex with men (husband only)  Substance Use ??? marijuana (smokes nightly)  Social History     Tobacco Use   ??? Smoking status: Former Smoker     Types: Cigarettes   ??? Smokeless tobacco: Never Used   Substance Use Topics   ??? Alcohol use: Not Currently     Alcohol/week: 0.0 standard drinks     Comment: rare     Review of Systems  As per HPI. Remainder of 10 systems reviewed, negative.      Objective:      LMP 07/14/2018   Wt Readings from Last 3 Encounters:   03/29/19 80.7 kg (178 lb)   03/07/19 80.8 kg (178 lb 1.6 oz)   02/25/19 81.3 kg (179 lb 3.2 oz)     Phone visit    Laboratory Data  Reviewed in Epic today, using Synopsis and Chart Review filters.    Lab Results   Component Value Date    CREATININE 0.69 03/29/2019    QFTTBGOLD NEGATIVE 02/13/2014    HEPCAB Nonreactive 10/22/2018    CHOL 140 03/28/2018    HDL 42 03/28/2018    LDL 78 03/28/2018    NONHDL 98 03/28/2018    TRIG 99 03/28/2018    A1C 5.2 10/22/2018    PAP Negative for intraephithelial lesion or malignancy 03/29/2017    FINALDX  06/14/2018     A: Products of conception, removal  -Immature and hydropic chorionic villi, inflamed gestational endometrium and decidua with necrosis, most  consistent with hydropic abortus (see comment)  - No diagnostic gestational trophoblastic disease identified (correlate with molecular studies)    This electronic signature is attestation that the pathologist personally reviewed the submitted material(s) and the final diagnosis reflects that evaluation.              _____________________________________________________________________

## 2019-04-03 NOTE — Unmapped (Signed)
Pt called reporting swelling in feet/ankles. States the ID Dr. She had an apt w/ earlier this afternoon was concerned and instructed her to call OB. Pt denies redness/heat. States the swelling is bilateral. Denies s/s of pre-eclampsia. BP on home cuff 114/74. Educated pt regarding PP swelling. DVT and pre-eclampsia precautions reviewed. Pt advised to elevate feet when sitting, hydrate, and walk. Pt verbalized understanding.

## 2019-04-09 NOTE — Unmapped (Signed)
Received inbasket from Dr. Durwin Glaze that pt should go to L&D. Called pt and updated. Pt verbalized agreement w/ plan and states she will go this afternoon.

## 2019-04-09 NOTE — Unmapped (Signed)
Pt called reporting pus draining from incision. Pt states she is unable to look at her incision for herself and has her husband look at night. Last night he noted pus w/ sour smell. Pt denies fever, redness and heat. Pt states she has been taking tylenol and ibuprofen for pain relief w/ occasional oxycodone for break through pain. (Most recently last night). Pt is PP from C/s on 6/19. Offered pt apt for wound check this afternoon. Pt accepted. Scheduler and Provider notified.

## 2019-04-11 NOTE — Unmapped (Signed)
Opened and closed encounter in error    Corliss Skains. [place], [place].D.  Specialty Pharmacist  [facility]  [phone] option 4

## 2019-04-11 NOTE — Unmapped (Signed)
[facility] Clinical Assessment & Refill Coordination Note    Morgan Hebert, DOB: 11/02/1985  Phone: [phone] (home) [phone] (work)    All above HIPAA information was verified with patient.     Specialty Medication(s):   Infectious Disease: Biktarvy     Current Outpatient Medications   Medication Sig Dispense Refill   ??? acetaminophen (TYLENOL) 325 MG tablet Take 2 tablets (650 mg total) by mouth Every six (6) hours. 120 tablet 0   ??? acetaZOLAMIDE (DIAMOX) 500 mg capsule Take 500 mg by mouth.     ??? bictegrav-emtricit-tenofov ala (BIKTARVY) 50-200-25 mg tablet Take 1 tablet by mouth daily. 30 tablet 11   ??? ibuprofen (MOTRIN) 600 MG tablet Take 1 tablet (600 mg total) by mouth Every six (6) hours. 60 tablet 0   ??? polyethylene glycol (MIRALAX) 17 gram packet Mix 1 packet (17 g) with 4 to 8 ounces of liquid and take by mouth daily. (Patient not taking: Reported on 04/02/2019) 30 packet 0   ??? prenatal vit no.124-iron-folic (PRENATAL VITAMIN) 27 mg iron- 800 mcg Tab Take 1 tablet by mouth.     ??? senna-docusate (SENNOSIDES-DOCUSATE SODIUM) 8.6-50 mg Take 1 tablet by mouth daily. (Patient not taking: Reported on 04/02/2019) 100 tablet 0   ??? SUMAtriptan (IMITREX) 50 MG tablet Take 1 tablet (50 mg total) by mouth every two (2) hours as needed for migraine (Maximum 2 doses/day & 4 doses/week). 16 tablet 2     No current facility-administered medications for this visit.         Changes to medications: Nickayla reports no changes at this time.    Allergies   Allergen Reactions   ??? 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       Changes to allergies: No    SPECIALTY MEDICATION ADHERENCE     Biktarvy   : 12 days of medicine on hand       Medication Adherence    Patient reported X missed doses in the last month:  2  Specialty Medication:  Biktarvy  Patient is on additional specialty medications:  No  Informant:  patient Specialty medication(s) dose(s) confirmed: Regimen is correct and unchanged.     Are there any concerns with adherence? No. She missed 2 doses during her recent stay in the hospital after having a C-Section.  Since the hospital does not have Biktarvy on formulary, they tried to give her Tivicay and Descovy and the patient reported a misunderstanding about what the hospital was trying to give her as a replacement for Biktarvy.  She stated that it was documented that she refused the Tivicay and Descovy but she states that she did not refuse.    Adherence counseling provided? Not needed    CLINICAL MANAGEMENT AND INTERVENTION      Clinical Benefit Assessment:    Do you feel the medicine is effective or helping your condition? Yes    HIV ASSOCIATED LABS:     Lab Results   Component Value Date/Time    HIVRS Detected (A) 02/25/2019 10:12 AM    HIVRS Not Detected 09/26/2018 11:07 AM    HIVRS Detected (A) 07/04/2018 02:06 PM    HIVRS 42,400 10/23/2017    HIVRS not detected 03/29/2017    HIVRS detected 10/25/2016    HIVCP <40 (H) 02/25/2019 10:12 AM    HIVCP <40 (H) 07/04/2018 02:06 PM    HIVCP detected 10/23/2017  HIVCP <40 (H) 05/03/2017 02:29 PM    HIVCP <20 03/29/2017    HIVCP <20 10/25/2016    RCD4 54.4 05/01/2015    RCD4 53.7 10/24/2014    ACD4 1,118 10/22/2018 09:45 AM    ACD4 1,696 (A) 05/01/2015    ACD4 967 10/24/2014    ACD4 854 06/23/2014 12:26 PM       Clinical Benefit counseling provided? Labs from 02/25/2019 show evidence of clinical benefit    Adverse Effects Assessment:    Are you experiencing any side effects? No    Are you experiencing difficulty administering your medicine? No    Quality of Life Assessment:    How many days over the past month did your HIV  keep you from your normal activities? For example, brushing your teeth or getting up in the morning. 0    Have you discussed this with your provider? Not needed    Therapy Appropriateness:    Is therapy appropriate? Yes, therapy is appropriate and should be continued    DISEASE/MEDICATION-SPECIFIC INFORMATION      N/A    PATIENT SPECIFIC NEEDS     ? Does the patient have any physical, cognitive, or cultural barriers? No    ? Is the patient high risk? No     ? Does the patient require a Care Management Plan? No     ? Does the patient require physician intervention or other additional services (i.e. nutrition, smoking cessation, social work)? No      SHIPPING     Specialty Medication(s) to be Shipped:   Infectious Disease: Biktarvy    Other medication(s) to be shipped: n/a     Changes to insurance: No    Delivery Scheduled: Yes, Expected medication delivery date: 04/16/2019.     Medication will be delivered via Next Day Courier to the confirmed home address in [facility].    The patient will receive a drug information handout for each medication shipped and additional FDA Medication Guides as required.  Verified that patient has previously received a Conservation officer, historic buildings.    All of the patient's questions and concerns have been addressed.    Roderic Palau   [facility] Specialty Pharmacist

## 2019-04-12 MED FILL — BIKTARVY 50 MG-200 MG-25 MG TABLET: 30 days supply | Qty: 30 | Fill #5 | Status: AC

## 2019-04-12 MED FILL — BIKTARVY 50 MG-200 MG-25 MG TABLET: ORAL | 30 days supply | Qty: 30 | Fill #5

## 2019-04-15 MED ORDER — ONDANSETRON HCL 4 MG TABLET
ORAL_TABLET | 2 refills | 0 days | Status: CP
Start: 2019-04-15 — End: ?

## 2019-04-18 ENCOUNTER — Encounter
Admit: 2019-04-18 | Discharge: 2019-04-19 | Payer: BLUE CROSS/BLUE SHIELD | Attending: Obstetrics & Gynecology | Primary: Obstetrics & Gynecology

## 2019-04-18 DIAGNOSIS — Z3009 Encounter for other general counseling and advice on contraception: Secondary | ICD-10-CM

## 2019-04-18 DIAGNOSIS — Z302 Encounter for sterilization: Principal | ICD-10-CM

## 2019-04-18 NOTE — Unmapped (Signed)
ASSESSMENT/PLAN:  Morgan Hebert is a 33 y.o. [id number] was seen today for  >   Sterilization Consult -  We extensively discussed the alternatives to surgical sterilization, including vasectomy and long-acting reversible contraception. We discussed the surgical risks, which include but are not limited to infection, bleeding, damage to nearby tissue/organs, need for further surgery to repair any damage, or inability to complete the procedure. She is aware that the risk of pregnancy is low, but that the risk of ectopic is close to 1/3 should she undergo bilateral tubal ligation and conceive. She understands that sterilization should not effect her menses or sexual function.The patient is certain regarding her decision, and does not desire any future children.     >Opportunistic approach for sterilization:  We discussed bilateral salpingectomy for an opportunity to reduce the risk of ovarian cancer.  The patient desires to proceed with bilateral salpingectomy for her sterilization approach.     >   PreOp Surgical Counseling - Risks of the procedure discussed with patient including infection, bleeding, damage to surrounding organs, uterine perforation, and risk of failure of approx 1/250 with increased risk of ectopic pregnancy, and the risk of failure.  The patient will accept blood transfusion and does not have a DNR in place. Consent form signed and witnessed and scanned.  She knows to expect a phone call from our surgical scheduler.    > Pre-operative planning:   Pre-op labs: None indicated (UPT on day of surgery)  Interim contraception: condoms  Presumptive surgery date: August 18, 25  Surgery location: [facility]  Operative consent: signed on April 18, 2019  Medicaid consent: signed on today  Anesthesia evaluation: not needed  BMI: Body mass index is Body mass index is 34.34 kg/m??.  OR Risk Stratification for COVID:  21  COVID-19 Testing reviewed: Yes, she knows she will need to be tested at the [facility]    > Post-operative care   -Emphasized the importance of no heavy lifting/pushing/pulling greater than 10 lbs, and nothing in vagina for 2 weeks after surgery   - All questions were answered.     SUBJECTIVE:  CC: Sterilization consult  HPI: Morgan Hebert is a 33 y.o. female [phone] Patient's last menstrual period was 07/14/2018.Marland Kitchen   She was referred by MFM because she wasn't able to have her tubal at the time of her cesarean delivery on March 29, 2019  Today she reports she was supposed to have it done on L&D but didn't get approval through [facility] for delivery.  Prior abdominal operations: cesarean delivery March 29, 2019, no other operations, had an ectopic and managed by expectant management  Patient's cell phone: [phone]  Partner/other cell phone:  Joretta Bachelor [phone]    Past Medical History:   Diagnosis Date   ??? Abnormal mammogram    ??? Constipation    ??? Diarrhea    ??? Environmental allergies    ??? GDM- diet controlled 03/07/2019   ??? HIV (human immunodeficiency virus infection) (CMS-HCC)    ??? HIV (human immunodeficiency virus infection) (CMS-HCC)    ??? IUD (intrauterine device) in place    ??? Migraine    ??? Obesity    ??? Pseudotumor cerebri    ??? STD (sexually transmitted disease)      Past Surgical History:   Procedure Laterality Date   ??? DILATION AND CURETTAGE OF UTERUS     ??? LUMBAR PUNCTURE     ??? PR CESAREAN DELIVERY ONLY N/A 03/29/2019    Procedure:  CESAREAN DELIVERY ONLY;  Surgeon: Asher Muir, MD;  Location: L&D C-SECTION OR SUITES [facility];  Service: Maternal-Fetal Medicine   ??? PR COLONOSCOPY W/BIOPSY SINGLE/MULTIPLE  07/03/2014    Procedure: COLONOSCOPY, FLEXIBLE, PROXIMAL TO SPLENIC FLEXURE; WITH BIOPSY, SINGLE OR MULTIPLE;  Surgeon: Billie Ruddy, MD;  [facility];  Service: Gastroenterology   ??? PR DILATION/CURETTAGE,DIAGNOSTIC N/A 06/14/2018    Procedure: DILATION AND CURETTAGE, DIAGNOSTIC AND/OR THERAPEUTIC (NON OBSTETRICAL);  Surgeon: Nelle Don, MD;  Location: [facility];  Service: [facility] Primary Gynecology   ??? PR UPPER GI ENDOSCOPY,BIOPSY N/A 07/03/2014    Procedure: UGI ENDOSCOPY; WITH BIOPSY, SINGLE OR MULTIPLE;  Surgeon: Billie Ruddy, MD;  Location: [facility];  Service: Gastroenterology   ??? WISDOM TOOTH EXTRACTION       OB History   Gravida Para Term Preterm AB Living   11 2 1 1 9 2    SAB TAB Ectopic Molar Multiple Live Births   8   1   0 2      # Outcome Date GA Lbr Len/2nd Weight Sex Delivery Anes PTL Lv   11 Preterm 03/29/19 [redacted]w[redacted]d  1970 g (4 lb 5.5 oz) F CS-LTranv EPI, Spinal Y LIV      Complications: Fetal Intolerance   10 SAB 06/2018           9 SAB 2013           8 Ectopic 2013           7 SAB 2012           6 SAB 2012           5 SAB 2011           4 SAB 2010           3 SAB 2010           2 Term 06/28/06   2693 g (5 lb 15 oz) F Vag-Spont  N LIV   1 SAB               Obstetric Comments   OB-History reviewed by Lucilla Lame RN on 04/02/2019.       Current Outpatient Medications:   ???  acetaminophen (TYLENOL) 325 MG tablet, Take 2 tablets (650 mg total) by mouth Every six (6) hours., Disp: 120 tablet, Rfl: 0  ???  acetaZOLAMIDE (DIAMOX) 500 mg capsule, Take 500 mg by mouth., Disp: , Rfl:   ???  bictegrav-emtricit-tenofov ala (BIKTARVY) 50-200-25 mg tablet, Take 1 tablet by mouth daily., Disp: 30 tablet, Rfl: 11  ???  ibuprofen (MOTRIN) 600 MG tablet, Take 1 tablet (600 mg total) by mouth Every six (6) hours., Disp: 60 tablet, Rfl: 0  ???  ondansetron (ZOFRAN) 4 MG tablet, TAKE ONE TABLET BY MOUTH EVERY 4 HOURS AS NEEDED FOR NAUSEA AND VOMITING, Disp: 30 tablet, Rfl: 2  ???  polyethylene glycol (MIRALAX) 17 gram packet, Mix 1 packet (17 g) with 4 to 8 ounces of liquid and take by mouth daily., Disp: 30 packet, Rfl: 0  ???  prenatal vit no.124-iron-folic (PRENATAL VITAMIN) 27 mg iron- 800 mcg Tab, Take 1 tablet by mouth., Disp: , Rfl:   ???  senna-docusate (SENNOSIDES-DOCUSATE SODIUM) 8.6-50 mg, Take 1 tablet by mouth daily., Disp: 100 tablet, Rfl: 0  ???  SUMAtriptan (IMITREX) 50 MG tablet, Take 1 tablet (50 mg total) by mouth every two (2) hours as needed for migraine (Maximum 2  doses/day & 4 doses/week)., Disp: 16 tablet, Rfl: 2  Allergies   Allergen Reactions   ??? 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       Social History     Socioeconomic History   ??? Marital status: Married     Spouse name: None   ??? Number of children: None   ??? Years of education: None   ??? Highest education level: None   Occupational History   ??? None   Social Needs   ??? Financial resource strain: None   ??? Food insecurity     Worry: None     Inability: None   ??? Transportation needs     Medical: None     Non-medical: None   Tobacco Use   ??? Smoking status: Former Smoker     Types: Cigarettes   ??? Smokeless tobacco: Never Used   Substance and Sexual Activity   ??? Alcohol use: Not Currently     Alcohol/week: 0.0 standard drinks     Comment: rare   ??? Drug use: Not Currently     Types: Marijuana   ??? Sexual activity: Yes     Partners: Male   Lifestyle   ??? Physical activity     Days per week: None     Minutes per session: None   ??? Stress: None   Relationships   ??? Social Wellsite geologist on phone: None     Gets together: None     Attends religious service: None     Active member of club or organization: None     Attends meetings of clubs or organizations: None     Relationship status: None   Other Topics Concern   ??? None   Social History Narrative    Patient lives in [place], [place] with her fiance and daughter.  She is not employed.     Family History   Problem Relation Age of Onset   ??? Cervical cancer Mother    ??? Diabetes Mother    ??? Asthma Mother    ??? Hypertension Mother    ??? Diabetes Maternal Aunt    ??? Hypertension Maternal Aunt    ??? Breast cancer Maternal Grandmother    ??? Cancer Maternal Grandmother    ??? No Known Problems Daughter    ??? Glaucoma Neg Hx        REVIEW OF SYSTEMS : A comprehensive review of 10 systems was negative except for pertinent positives noted in HPI.  Objective  BP 111/86  - Pulse 87  - Temp 37.1 ??C (98.7 ??F)  - Wt 77.1 kg (170 lb)  - LMP 07/14/2018  - BMI 34.34 kg/m??  Body mass index is 34.34 kg/m??.  GENERAL:healthy  ABDOMEN:abdomen is soft without tenderness, masses, organomegaly or guarding

## 2019-04-18 NOTE — Unmapped (Addendum)
Dear Morgan Hebert,  Thank you for coming see me today at the [facility].    Below you will see some statements about things we discussed in clinic and also what your next steps are.  If we did any testing today those results will be available on MyChart as they come in and as they are reviewed.  If you have not yet signed up for MyChart please do because that is the best way for communication  Please primarily contact our clinic with questions or for refills through MyChart.  Generally Mychart messages and requests will be answered within two business days.      Best,  Huntley Dec, MD, MPHTM  Professor - ObGyn  Director - Division of [facility] of Medicine and [facility] System  Appointments/Clinic Line: [phone]    PLAN:  Please call your insurance to confirm you can have prophylactic bilateral salpingectomy at the time of your laparoscopic sterilization procedure.    >INSTRUCTIONS FOR YOUR UPCOMING STERILIZATION SURGERY    Your upcoming Surgery will be at the [facility].  The instructions are below.    The anticipated date for your procedure is: August 18 or 25th    However, that will need to be confirmed with our OR scheduler, August Luz,  who will call you in the next few weeks.   Please be on the look out for phone numbers that start with [phone],   She will be aware of your upcoming procedure in the next couple of days, so please anticipate a phone call from her.    Once set, your surgical date will not change unless you have direct communication with the OR scheduler or with your doctor through MyChart or by telephone.    The day before your operation  1. Please expect a phone call from the [facility] Operating Room the day before your procedure.  In this phone call they will tell you the exact time to arrive at the operating room on the day of your procedure.  2. Do not eat or drink anything starting at Midnight the night before your operation.  If you take medications you may take these with small sips of water  3. Bring someone to accompany you to the hospital, who can also stay throughout the procedure.  They will need to be present to drive you home.  You will not be able to check-in for your same-day operation unless you have a designated driver with you.    On the day of your procedure:  1. Arrive at the [facility] campus ([address], [place], [place]  [address]) pre-op area at the time given to you by Operating Room personnel the day beofre your procedure.  2. You can generally expect your arrival time will be between 6am and 10am, and you will be ready to leave the hospital about 5 hours later.  Your anesthesia will begeneral endotracheal anesthesia.   After your procedure:  1. You will have small bandages and stiches at the site of the incisions  2. You should not make any important decisions or sign any important papers for 24 hours following your surgery  3. You will be given a prescription for pain medicine   4. No lifting anything heavier than 10 pounds for two weeks   5. Please call us at [phone] if you have any of the following:  ?? Fever greater than 100.4   ?? Cramping or  pain that isn't relieved with the pain medicine prescription we gave you  ?? If you have any drainage from your wound      DIRECTIONS TO [facility]  [address]  [place], [place] [address]    Directions from the [facility])  ?? Take [address] [place] to [address] for [facility]  ?? Turn LEFT onto [address])  ?? Go to the 4th light and turn LEFT onto [address]  ?? The building and parking lot will be on your RIGHT    Directions from the [place] [employer])  ?? Take [address] [place] toward [facility]  ?? Merge opnto [address] toward [employer]  ?? Turn LEFT onto [address] [place] and follow the directions from the [place] below    Directions form the [address] [facility])  ?? Take [address] [place] toward [facility] to Exit [address] for [facility]  ?? Bear RIGHT off the exit onto [address]  ?? At the first light turn RIGHT onto [address]  ?? The building and parking lot will be on your RIGHT    Directions from the [facility])  ?? Take [address] toward [place]  ?? At the I-40/[address] split stay to the RIGHT onto [address] towards [facility]  ?? Take Exit 261 for [facility]  ?? Turn LEFT off the exit onto [address] ([address]  ?? At the first light turn RIGHT onto [facility]

## 2019-04-23 NOTE — Unmapped (Signed)
Patient is scheduled for 05/25/19 @ 9:00 at the [facility] PRE TEST ACC [place] location. Date, time, address and our phone number were given to patient.      Was appointment confirmed? Yes by patient.    From: Romie Jumper  Sent: 04/23/2019   2:47 PM EDT  To: Covid Rdc Schedulers  Subject: COVID Prescreening                               .Patient is scheduled for surgery:  Date: 05/28/2019  Time: :  Location: [facility] [address] [place], [place]   DR STUART    Please arrange pre-operative COVID testing.    Thanks,  Romie Jumper

## 2019-04-29 ENCOUNTER — Encounter: Admit: 2019-04-29 | Discharge: 2019-04-30 | Payer: BLUE CROSS/BLUE SHIELD | Attending: "Obstetric | Primary: "Obstetric

## 2019-04-29 NOTE — Unmapped (Signed)
Provider: Loni Beckwith, [facility]  Division: Maternal-Fetal Medicine    Virtual Video Encounter  Postpartum Note    I spent 15 minutes on the real-time audio and video with the patient. I spent an additional 10 minutes on pre- and post-visit activities.     The patient was physically located in [place] or a state in which I am permitted to provide care. The patient and/or parent/guardian understood that s/he may incur co-pays and cost sharing, and agreed to the telemedicine visit. The visit was reasonable and appropriate under the circumstances given the patient's presentation at the time.    The patient and/or parent/guardian has been advised of the potential risks and limitations of this mode of treatment (including, but not limited to, the absence of in-person examination) and has agreed to be treated using telemedicine. The patient's/patient's family's questions regarding telemedicine have been answered.     If the visit was completed in an ambulatory setting, the patient and/or parent/guardian has also been advised to contact their provider???s office for worsening conditions, and seek emergency medical treatment and/or call 911 if the patient deems either necessary.                Primary Care Provider:   Jacklynn Ganong, MD      Delivery Date:    03/29/2019      Mode of Delivery:    Low-transverse cesarean     Pregnancy Complications:    GDM, pseudotumor, HIV     Delivery Complications:    NR NST for decreased fetal movement     Infant:   Sex: Living  female infant  Birthweight: 69.49  ounces.   Status: home     Contraception Desire:            BTL-had consultation with family planning, BTL procedure is scheduled in August in [place]     Contraindications to contraceptive choice (CDC [facility] for contraceptive use):    no  Infant Feeding Method:            bottle     Review of Systems:    Depression symptoms - denies  Postpartum Edinburgh scale: Edinburgh Total Score: (not recorded) Continent of urine, feces and flatus  No further vaginal bleeding  Not sexually active to date     Physical Exam:                   (Physical Examination (to extent possible); Laboratory and Test Data (as available))    General  No acute distress heard  Pulmonary  No shortness of breath or coughing heard  Neurologic  Alert and oriented to person, place and time  Psychiatric   Mood and affect within normal limits  Incision:   Well-healing and intact per pt                                                          Assessment:    Morgan Hebert is a 33 y.o. yo [id number] s/p Emergent or primary low transverse cesarean section for NR NST on 03/29/19 seen for a scheduled routine postpartum visit.     Plan:     1) Reproductive Health:   - Plan for future pregnancy:  none  - Counseled re recommended interconceptional interval and steps for prevention of  recurrent pregnancy complications.    2) Contraception: The risks and benefits of all contraception options were discussed, taking into account the patient's reproductive life goals.    - After a detailed discussion regarding various methods of contraception, she has elected to proceed with bilateral tubal ligation.    3) Postpartum mental health:   - Reviewed importance of mental self-care.   - Discussed changes in family dynamics and engaging friends and family in assisting with care responsibilities.  - Low concern for postpartum depression based on self-report, clinical exam, and Edinburgh Depression Scale score.   - We reviewed signs of postpartum depression today.     4) Infant feeding: continue formula feeding.     5) Health Care Maintenance:   - Continue efforts with healthy eating, exercise,  smoking cessation and pregnancy weight loss.  - Vaccinations: up to date  - Reviewed PP exercise guidelines.  - Patient is due for her next cervical dysplasia screening in 2022   - Discussed transition of care to PCP: Jacklynn Ganong, MD. She was counseled to follow-up for a yearly well-women check-up with either PCP or GYN provider.    5) Medical comorbidities: HIV  - Plans ongoing care with [facility] infectious Disease clinic       This note was cc'd to the patient's PCP, with the patient's consent.     Dr. Bronson Curb was available for the care of this patient.    ______________________________  Orie Rout. Loleta Chance, MSN, [facility]  Nurse Practitioner  Division of Maternal-Fetal Medicine  Department of Obstetrics & Gynecology  [facility]

## 2019-05-02 NOTE — Unmapped (Signed)
Patient called and said she is having more issues with migraines post-delivery and is also having pain in her spine. She can be reached at [phone].

## 2019-05-03 NOTE — Unmapped (Signed)
Patient called again and reported having sharp pain in her side when she eats or drinks, and the food reaches the middle of her chest.  I advised that if she felt that she needed medical attention urgently, she should go to ED.

## 2019-05-06 MED ORDER — ACETAZOLAMIDE ER 500 MG CAPSULE,EXTENDED RELEASE
ORAL_CAPSULE | Freq: Two times a day (BID) | ORAL | 1 refills | 30.00000 days | Status: CP
Start: 2019-05-06 — End: 2019-05-15

## 2019-05-07 MED ORDER — PROMETHAZINE 25 MG TABLET
ORAL_TABLET | Freq: Four times a day (QID) | ORAL | 5 refills | 8.00000 days | Status: SS | PRN
Start: 2019-05-07 — End: 2019-06-04

## 2019-05-07 NOTE — Unmapped (Signed)
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 Agreeable to urgent perimetry (which she has agreed to arrange locally), increasing Diamox to 1g BID, and follow-up with neurology in thr next week. Instructed to attend ED for more urgent evaluation if her vision continues to worsen. Will plan to see her in clinic in early August.     Mindi Curling, MBBS  PGY-3 Neurology  [facility] Department of Neurology

## 2019-05-07 NOTE — Unmapped (Signed)
Last Visit Date: Visit date not found  Next Visit Date: 05/15/2019    Lab Results   Component Value Date    Hep B Surface Ag Nonreactive 09/26/2018    Hepatitis B Surface Ag Negative 02/13/2014    Hep B S Ab Reactive 02/13/2014    Hepatitis C Ab Nonreactive 10/22/2018    Hepatitis C Ab Negative 02/13/2014    HIV Antigen/Antibody Combo SEE BELOW (AA) 02/09/2014        No results found for this or any previous visit.    No results found for this or any previous visit.    No results found for this or any previous visit.    No results found for this or any previous visit.

## 2019-05-09 NOTE — Unmapped (Signed)
[facility] Refill Coordination Note    Specialty Medication(s) to be Shipped:   Infectious Disease: Biktarvy    Other medication(s) to be shipped:   -0     Morgan Hebert  DOB: 31-Jul-1986  Phone: [phone] (home) [phone] (work)  Shipping Address: [phone] [place] [address]    All above HIPAA information was verified with patient.     Completed refill call assessment today to schedule patient's medication shipment from the [facility] [phone]).       Specialty medication(s) and dose(s) confirmed: Regimen is correct and unchanged.   Changes to medications: Bridgette reports no changes reported at this time.  Changes to insurance: No  Questions for the pharmacist: No    Confirmed patient received Welcome Packet with first shipment. The patient will receive a drug information handout for each medication shipped and additional FDA Medication Guides as required.       DISEASE/MEDICATION-SPECIFIC INFORMATION        N/A        SPECIALTY MEDICATION ADHERENCE     Medication Adherence    Patient reported X missed doses in the last month: 0  Specialty Medication: BIKTARVY (7)   Patient is on additional specialty medications: No  Informant: patient  Confirmed plan for next specialty medication refill: delivery by pharmacy  Refills needed for supportive medications: not needed          Refill Coordination    Has the Patients' Contact Information Changed: No  Is the Shipping Address Different: No                   BIKTARVY  : 7 days of medicine on hand         ADDITIONAL NOTES         N/A        SHIPPING     Shipping Information    Delivery Scheduled: Yes  Delivery Date: 05/14/19         Shipping address confirmed in Epic.     Delivery Scheduled: Yes, Expected medication delivery date: 8/4.     Medication will be delivered via Next Day Courier to the home address in Epic WAM.    [facility]   [facility] Shared [facility] Specialty Technician

## 2019-05-13 MED FILL — BIKTARVY 50 MG-200 MG-25 MG TABLET: 30 days supply | Qty: 30 | Fill #6 | Status: AC

## 2019-05-13 MED FILL — BIKTARVY 50 MG-200 MG-25 MG TABLET: ORAL | 30 days supply | Qty: 30 | Fill #6

## 2019-05-15 ENCOUNTER — Encounter: Admit: 2019-05-15 | Discharge: 2019-05-16 | Payer: BLUE CROSS/BLUE SHIELD

## 2019-05-15 DIAGNOSIS — G932 Benign intracranial hypertension: Principal | ICD-10-CM

## 2019-05-15 DIAGNOSIS — B20 Human immunodeficiency virus [HIV] disease: Secondary | ICD-10-CM

## 2019-05-15 MED ORDER — ACETAZOLAMIDE ER 500 MG CAPSULE,EXTENDED RELEASE
ORAL_CAPSULE | Freq: Two times a day (BID) | ORAL | 5 refills | 30 days | Status: CP
Start: 2019-05-15 — End: 2019-11-11

## 2019-05-15 NOTE — Unmapped (Addendum)
Neurology Follow-up Visit Note     [address]  [facility] NEUROLOGY [facility]IC [address] [place]  [address]  [place] [address]  [phone]    Date: 05/15/2019   Patient Name: Morgan Hebert   MRN: [id number]   PCP: Morgan Hebert  Referring Provider: Colin Benton, MD       Assessment and Plan          Morgan Hebert is a 33 y.o. female with a past medical history of pseudotumor cerebri, multifactorial headaches, chronic GI complaints (possibly of functional nature), and HIV (on Biktarvy, VL 240 in June 2019) presenting for follow-up of her multifactorial headaches and pseudotumor cerebri.     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 Though her headache has been more or less stable with her current medications, given her previous Grade I bilateral papilledema, complaints of worsened visual acuity and peripheral vision changes, will arrange LP for opening pressure (+/- large-volume tap), neuro-ophthalmology evaluation, and give advice to attend ED with any worsening vision for more urgent ophthalmology evaluation and LP. Though we have a relatively low suspicion that her recent complaints of mid-thoracic spine pain are related to her IIH, we will pursue imaging as below given her HIV +ve status and recent delivery.    - Increase Diamox back to 1500mg  BID  - Can consider restarting Zonisamide if minimal improvement after 2 weeks of Diamox 1500mg  BID.  - Can use Tylenol 1g q8h PRN, promethazine 25mg  q8h PRN, and sumatriptan 50mg  q2h PRN as abortives. Aim to limit use of Tylenol to 2-3x per week, to limit medication overuse headache.  - Advised to try to limit stressors, maintain hydration & regular sleep schedule, limit screen time, and limit prolonged neck flexion   - Referred to [facility] Neuro-Ophthalmology for perimetry and retinal evaluation  - Referred to Optometry  - Referred for LP Clinic for opening pressure +/- large-volume tap; will liaise with gynecology about the possibility of performing a large volume LP while when she goes for gynecological surgery on 8/18  - With any worsening of her vision, advised to attend ED for ophthalmology evaluation and LP for opening pressure (+/- large volume tap)  - MRI T & L-spine w/ & w/o contrast, als head CT to evaluate for increased ICP.   [facility] follow-up in 3 months.     THORACIC SPINE PAIN, worsening  - we will do MRI T/L spine due to history of HIV      Reviewed the differential diagnosis, plan of care in detail, as well as other elements as detailed above.  A written summary was provided to the patient, including a current medication and allergy list, problem list and plan of care. In addition, my contact information was provided to the patient, in writing.  All the patient's questions and concerns were addressed to their stated satisfaction .      Return Visit Discussed: No follow-ups on file.     I spent 20 minutes on the phone with the patient. This patient was discussed with Morgan Hebert who agrees with the above assessment and plan.       The patient was physically located in [place] or a state in  which I am permitted to provide care. The patient and/or parent/gauardian understood that s/he may incur co-pays and cost sharing, and agreed to the telemedicine visit. The visit was completed via phone and/or video, which was appropriate and reasonable under the circumstances given the patient's presentation at the time.    The patient and/or parent/guardian has been advised of the potential risks and limitations of this mode of treatment (including, but not limited to, the absence of in-person examination) and has agreed to be treated using telemedicine. The patient's/patient's family's questions regarding telemedicine have been answered.     If the phone/video visit was completed in an ambulatory setting, the patient and/or parent/guardian has also been advised to contact their provider???s office for worsening conditions, and seek emergency medical treatment and/or call 911 if the patient deems either necessary.    Morgan Hebert, MBBS  PGY-3 Neurology  [facility] Department of Neurology    *Patient note was created using Dragon Dictation.  Any errors in syntax or even information may not have been identified and edited on initial review prior to signing this note.         HPI         HPI: Morgan Hebert is a 33 y.o. female with a past medical history of pseudotumor cerebri, multifactorial headaches, chronic GI complaints (possibly of functional nature), and HIV (on Biktarvy, VL 240 in June 2019) presenting for follow-up of her multifactorial headaches and pseudotumor cerebri. Patient was last seen by [facility] Neurology on 01/23/2019.        Please see progress note dated 01/23/2019 for her detailed history.    Interval History 05/15/2019: Since her telephone encounter with neurology in late July 2020, patient has been taking 1000 mg of Diamox twice daily, with only mild improvement in her headache.  She notes that since the delivery of her youngest child in June 2020 she has felt slightly with left peripheral vision and inferior vision, occasionally bumping into door frames on her left and tripping over doorsteps.  She also feels her visual acuity has worsened, and finds herself wearing her glasses more of the time.  Denies double vision.  Her headaches stably bad, but her most salient complaint of late is midline thoracic spine pain, that began approximately 2 weeks ago; she cannot associate any particular trauma or injury with the onset of this pain, but feels it is always present, tender to palpation in the midline from the middle of her thoracic spine up to the base of her neck, and worst on swallowing food or drink.  She feels that when the food gets to the level of the bottom of his sternum, she feels an intense shooting pain that rises up in the midline of her back from the same level of her spine (approximately T6) to the base of her neck.  This new spinal pain makes it difficult to get comfortable in any position, and she feels her oral intake has reduced as result of the particular pain she feels on eating and drinking.  She feels her vision has been relatively stable over the past 2 weeks.  She has not been evaluated by ophthalmology or optometry since the delivery of her child.  She is also unclear as to whether there was any large volume tap performed at the time of her epidural.    Allergies   Allergen Reactions   ??? 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  Current Outpatient Medications   Medication Sig Dispense Refill   ??? acetaminophen (TYLENOL) 325 MG tablet Take 2 tablets (650 mg total) by mouth Every six (6) hours. 120 tablet 0   ??? acetaZOLAMIDE (DIAMOX) 500 mg capsule Take 3 capsules (1,500 mg total) by mouth Two (2) times a day. 180 capsule 5   ??? bictegrav-emtricit-tenofov ala (BIKTARVY) 50-200-25 mg tablet Take 1 tablet by mouth daily. 30 tablet 11   ??? ibuprofen (MOTRIN) 600 MG tablet Take 1 tablet (600 mg total) by mouth Every six (6) hours. 60 tablet 0   ??? ondansetron (ZOFRAN) 4 MG tablet TAKE ONE TABLET BY MOUTH EVERY 4 HOURS AS NEEDED FOR NAUSEA AND VOMITING 30 tablet 2   ??? prenatal vit no.124-iron-folic (PRENATAL VITAMIN) 27 mg iron- 800 mcg Tab Take 1 tablet by mouth.     ??? promethazine (PHENERGAN) 25 MG tablet Take 1 tablet (25 mg total) by mouth every six (6) hours as needed for nausea (migraine headache). 30 tablet 5   ??? SUMAtriptan (IMITREX) 50 MG tablet Take 1 tablet (50 mg total) by mouth every two (2) hours as needed for migraine (Maximum 2 doses/day & 4 doses/week). 16 tablet 2     No current facility-administered medications for this visit.        Past Medical History:   Diagnosis Date   ??? Abnormal mammogram    ??? Constipation    ??? Diarrhea    ??? Environmental allergies    ??? GDM- diet controlled 03/07/2019   ??? HIV (human immunodeficiency virus infection) (CMS-HCC)    ??? HIV (human immunodeficiency virus infection) (CMS-HCC)    ??? IUD (intrauterine device) in place    ??? Migraine    ??? Obesity    ??? Pseudotumor cerebri    ??? STD (sexually transmitted disease)        Past Surgical History:   Procedure Laterality Date   ??? DILATION AND CURETTAGE OF UTERUS     ??? LUMBAR PUNCTURE     ??? PR CESAREAN DELIVERY ONLY N/A 03/29/2019    Procedure: CESAREAN DELIVERY ONLY;  Surgeon: Asher Muir, MD;  Location: L&D C-SECTION OR SUITES [facility];  Service: Maternal-Fetal Medicine   ??? PR COLONOSCOPY W/BIOPSY SINGLE/MULTIPLE  07/03/2014    Procedure: COLONOSCOPY, FLEXIBLE, PROXIMAL TO SPLENIC FLEXURE; WITH BIOPSY, SINGLE OR MULTIPLE;  Surgeon: Billie Ruddy, MD;  Location: GI PROCEDURES [facility];  Service: Gastroenterology   ??? PR DILATION/CURETTAGE,DIAGNOSTIC N/A 06/14/2018    Procedure: DILATION AND CURETTAGE, DIAGNOSTIC AND/OR THERAPEUTIC (NON OBSTETRICAL);  Surgeon: Nelle Don, MD;  Location: [facility] OR [facility];  Service: [facility] Primary Gynecology   ??? PR UPPER GI ENDOSCOPY,BIOPSY N/A 07/03/2014    Procedure: UGI ENDOSCOPY; WITH BIOPSY, SINGLE OR MULTIPLE;  Surgeon: Billie Ruddy, MD;  Location: [facility] PROCEDURES [facility];  Service: Gastroenterology   ??? WISDOM TOOTH EXTRACTION         Social History     Socioeconomic History   ??? Marital status: Married     Spouse name: Not on file   ??? Number of children: Not on file   ??? Years of education: Not on file   ??? Highest education level: Not on file   Occupational History   ??? Not on file   Social Needs   ??? Financial resource strain: Not on file   ??? Food insecurity     Worry: Not on file     Inability: Not on file   ??? Transportation needs  Medical: Not on file     Non-medical: Not on file   Tobacco Use   ??? Smoking status: Former Smoker     Types: Cigarettes   ??? Smokeless tobacco: Never Used   Substance and Sexual Activity   ??? Alcohol use: Not Currently     Alcohol/week: 0.0 standard drinks     Comment: rare   ??? Drug use: Not Currently     Types: Marijuana   ??? Sexual activity: Yes     Partners: Male   Lifestyle   ??? Physical activity     Days per week: Not on file     Minutes per session: Not on file   ??? Stress: Not on file   Relationships   ??? Social Wellsite geologist on phone: Not on file     Gets together: Not on file     Attends religious service: Not on file     Active member of club or organization: Not on file     Attends meetings of clubs or organizations: Not on file     Relationship status: Not on file   Other Topics Concern   ??? Not on file   Social History Narrative    Patient lives in [place], [place] with her fiance and daughter.  She is not employed.       Family History   Problem Relation Age of Onset   ??? Cervical cancer Mother    ??? Diabetes Mother    ??? Asthma Mother    ??? Hypertension Mother    ??? Diabetes Maternal Aunt    ??? Hypertension Maternal Aunt    ??? Breast cancer Maternal Grandmother    ??? Cancer Maternal Grandmother    ??? No Known Problems Daughter    ??? Glaucoma Neg Hx         Review of Systems     A 10-system review of systems was conducted and was negative except as documented above in the HPI.       Objective        Vital signs: Ht 149.9 cm (4' 11)  - Wt 76.7 kg (169 lb)  - LMP 07/14/2018  - BMI 34.13 kg/m??      Physical Exam:  General Appearance:Well appearing. Overweight. Uncomfortable, periodically shifting position due to back pain.   HEENT: Head is atraumatic and normocephalic. Sclera anicteric without injection. Oropharyngeal membranes are moist with no erythema or exudate.  Neck: Supple. Grossly normal range of motion.  Lungs: Normal work of breathing.  Heart: Deferred due to remote consultation.  Abdomen: Deferred due to remote consultation.  Extremities: Deferred due to remote consultation.    Neurological Examination:     Mental Status: Alert, conversant, able to follow conversation and interview. Spontaneous speech was fluent without word finding pauses, dysarthria, or paraphasic errors. Comprehension was intact. Memory for recent and remote events was intact.    Cranial Nerves: Pursuit eye movements were uninterrupted with full range and without more than end-gaze nystagmus. Patient endorses worsened visual acuity since delivery of her child in June 2020, requiring continuous use of her glasses; her R eye has better acuity. Face symmetric at rest. Normal facial movement bilaterally, including forehead, eye closure and grimace/smile. Hearing intact to conversation. Tongue protrudes midline and tongue movements are normal.     Motor Exam: Normal bulk.  No tremors, myoclonus, or other adventitious movement.  Pronator drift is absent.    Reflexes: Deferred due to remote consultation.  Sensory: Deferred due to remote consultation.    Cerebellar/Coordination/Gait: Deferred due to remote consultation.            Diagnostic Studies     All Labs Last 24hrs:   No results found for this or any previous visit (from the past 24 hour(s)).  Risk Stratification:   Cholesterol (mg/dL)   Date Value   16/07/9603 140     Triglycerides (mg/dL)   Date Value   54/06/8118 99     HDL (mg/dL)   Date Value   14/78/2956 42     LDL Calculated (mg/dL)   Date Value   21/30/8657 78     TSH (uIU/mL)   Date Value   07/04/2018 2.190     Hemoglobin A1C (%)   Date Value   10/22/2018 5.2

## 2019-05-16 NOTE — Unmapped (Addendum)
You were seen today by [facility] Neurology for follow-up of your IIH. Your plan is listed below:  - Increase Diamox back to 1500mg  BID (can do this gradually, 1250mg  BID for 1 week, then up to 1500mg  BID)  - Can use Tylenol 1g q8h PRN, promethazine 25mg  q8h PRN, and sumatriptan 50mg  q2h PRN as abortives. Aim to limit use of Tylenol to 2-3x per week, to limit medication overuse headache.  - Advised to try to limit stressors, maintain hydration & regular sleep schedule, limit screen time, and limit prolonged neck flexion   - Referred to [facility] Neuro-Ophthalmology for perimetry and retinal evaluation  - Referred to Optometry  - Referred for LP Clinic; will liaise with gynecology about the possibility of performing a large volume LP while when she goes for gynecological surgery on 8/18  - With any worsening of her vision, advised to attend ED for ophthalmology evaluation and LP for opening pressure (+/- large volume tap)  - MRI T & L-spine w/ & w/o contrast  - [facility] Clinic follow-up in 3 months.     Regards,   [facility] Neurology

## 2019-05-19 NOTE — Unmapped (Signed)
Addended by: Andreas Newport, Donald Pore L on: 05/19/2019 12:41 PM     Modules accepted: Level of Service

## 2019-05-20 NOTE — Unmapped (Signed)
In discussion with Dr. Andreas Newport, ordered expedited outpatient CT Head to assess for obstructive hydrocephalus, and informed patient thereof.     Mindi Curling, MBBS  PGY-3 Neurology  [facility] Department of Neurology

## 2019-05-27 ENCOUNTER — Encounter: Admit: 2019-05-27 | Discharge: 2019-05-28 | Payer: BLUE CROSS/BLUE SHIELD

## 2019-05-27 DIAGNOSIS — G932 Benign intracranial hypertension: Principal | ICD-10-CM

## 2019-05-27 DIAGNOSIS — Z302 Encounter for sterilization: Principal | ICD-10-CM

## 2019-05-27 DIAGNOSIS — B20 Human immunodeficiency virus [HIV] disease: Secondary | ICD-10-CM

## 2019-05-28 DIAGNOSIS — Z302 Encounter for sterilization: Principal | ICD-10-CM

## 2019-06-03 ENCOUNTER — Encounter: Admit: 2019-06-03 | Discharge: 2019-06-04 | Payer: BLUE CROSS/BLUE SHIELD | Attending: Family | Primary: Family

## 2019-06-03 DIAGNOSIS — Z1159 Encounter for screening for other viral diseases: Principal | ICD-10-CM

## 2019-06-03 NOTE — Unmapped (Signed)
COVID Pre-Procedure Intake Form     Assessment     Morgan Hebert is a 33 y.o. female presenting to [facility] for COVID testing.     Plan     If no testing performed, pt counseled on routine care for respiratory illness.  If testing performed, COVID sent.  Patient directed to Home given findings during today's visit.    Subjective     Morgan Hebert is a 33 y.o. female who presents to the [facility] with complaints of the following:    Exposure History: In the last 21 days?     Have you traveled outside of [place]? No               Have you been in close contact with someone confirmed by a test to have COVID? (Close contact is within 6 feet for at least 10 minutes) No       Have you worked in a health care facility? No     Lived or worked facility like a nursing home, group home, or assisted living?    No         Are you scheduled to have surgery or a procedure in the next 3 days? Yes               Are you scheduled to receive cancer chemotherapy within the next 7 days?    No     Have you ever been tested before for COVID-19 with a swab of your nose? Yes: When: Not answered, Where: Not answered   Are you a healthcare worker being tested so to return to work No     Right now,  do you have any of the following that developed over the past 7 days (as stated by patient on intake form):    Subjective fever (felt feverish) No   Chills (especially repeated shaking chills) No   Severe fatigue (felt very tired) No   Muscle aches No   Runny nose No   Sore throat No   Loss of taste or smell No   Cough (new onset or worsening of chronic cough) No   Shortness of breath No   Nausea or vomiting No   Headache No   Abdominal Pain No   Diarrhea (3 or more loose stools in last 24 hours) No       Scribe's Attestation: Paulita Fujita, FNP obtained and performed the history, physical exam and medical decision making elements that were entered into the chart.  Signed by Mal Amabile, LCSW serving as Scribe, on 06/03/2019 12:57 PM      The documentation recorded by the scribe accurately reflects the service I personally performed and the decisions made by me. Aida Puffer, FNP  June 04, 2019 10:48 AM

## 2019-06-03 NOTE — Unmapped (Signed)
Telephone Note  June 03, 2019 10:49 AM      Spoke with patient about covid testing.     Pt to get tested this afternoon at 11:45. Confirmed with August Luz and with patient.        Chandra Batch, MD, MPH  Complex Family Planning Fellow, [place]  OBGYN  Date: 06/03/2019  Time: 10:49 AM

## 2019-06-04 ENCOUNTER — Encounter: Admit: 2019-06-04 | Discharge: 2019-06-04 | Payer: BLUE CROSS/BLUE SHIELD

## 2019-06-04 ENCOUNTER — Encounter
Admit: 2019-06-04 | Discharge: 2019-06-04 | Payer: BLUE CROSS/BLUE SHIELD | Attending: Anesthesiology | Primary: Anesthesiology

## 2019-06-04 MED ORDER — IBUPROFEN 600 MG TABLET
ORAL_TABLET | Freq: Four times a day (QID) | ORAL | 0 refills | 15.00000 days | Status: CP
Start: 2019-06-04 — End: ?

## 2019-06-04 MED ORDER — DOCUSATE SODIUM 100 MG CAPSULE
ORAL_CAPSULE | Freq: Three times a day (TID) | ORAL | 0 refills | 30 days | Status: CP
Start: 2019-06-04 — End: 2019-07-04

## 2019-06-04 MED ORDER — OXYCODONE-ACETAMINOPHEN 5 MG-325 MG TABLET
ORAL_TABLET | ORAL | 0 refills | 1 days | Status: CP | PRN
Start: 2019-06-04 — End: 2019-06-09

## 2019-06-04 MED ORDER — PROMETHAZINE 25 MG TABLET
ORAL_TABLET | Freq: Four times a day (QID) | ORAL | 5 refills | 8 days | Status: CP | PRN
Start: 2019-06-04 — End: ?

## 2019-06-04 NOTE — Unmapped (Signed)
DAY OF SURGERY UPDATE    H&P reviewed. The patient was examined and there are no changes to the H&P.      SITE MARKING ATTESTATION    Site Marked: Yes      CONSENT FOR OPERATION OR PROCEDURE: PROVIDER CERTIFICATION    I hereby certify that the nature, purpose, benefits, usual and most frequent risks of, and alternatives to, the operation or procedure have been explained to the patient (or person authorized to sign for the patient) either by a physician or by the provider who is to perform the operation or procedure, that the patient has had an opportunity to ask questions, and that those questions have been answered. The patient or the patient's representative has been advised that selected tasks may be performed by assistants to the primary health care provider(s). I believe that the patient (or person authorized to sign for the patient) understands what has been explained, and has consented to the operation or procedure.       Chandra Batch, MD, MPH  Complex Family Planning Fellow, [place]  OBGYN  Date: 06/04/2019  Time: 1:06 PM          From Preop H&P on 04/18/2019    ASSESSMENT/PLAN:  Morgan Hebert is a 33 y.o. [id number] was seen today for  >   Sterilization Consult -  We extensively discussed the alternatives to surgical sterilization, including vasectomy and long-acting reversible contraception. We discussed the surgical risks, which include but are not limited to infection, bleeding, damage to nearby tissue/organs, need for further surgery to repair any damage, or inability to complete the procedure. She is aware that the risk of pregnancy is low, but that the risk of ectopic is close to 1/3 should she undergo bilateral tubal ligation and conceive. She understands that sterilization should not effect her menses or sexual function.The patient is certain regarding her decision, and does not desire any future children.   ??  >Opportunistic approach for sterilization:  We discussed bilateral salpingectomy for an opportunity to reduce the risk of ovarian cancer.  The patient desires to proceed with bilateral salpingectomy for her sterilization approach.   ??  >   PreOp Surgical Counseling - Risks of the procedure discussed with patient including infection, bleeding, damage to surrounding organs, uterine perforation, and risk of failure of approx 1/250 with increased risk of ectopic pregnancy, and the risk of failure.  The patient will accept blood transfusion and does not have a DNR in place. Consent form signed and witnessed and scanned.  She knows to expect a phone call from our surgical scheduler.  ??  > Pre-operative planning:   Pre-op labs: None indicated (UPT on day of surgery)  Interim contraception: condoms  Presumptive surgery date: August 18, 25  Surgery location:??[place]  Operative consent: signed on April 18, 2019  Medicaid consent:??signed on today  Anesthesia evaluation:??not needed  BMI: Body mass index is Body mass index is 34.34 kg/m??.  OR Risk Stratification for COVID:  21  COVID-19 Testing reviewed: Yes, she knows she will need to be tested at the [facility]  ??  > Post-operative care   -Emphasized the importance of no heavy lifting/pushing/pulling greater than 10 lbs, and nothing in vagina for 2 weeks after surgery   - All questions were answered.   ??  SUBJECTIVE:  CC: Sterilization consult  HPI: Morgan Hebert is a 33 y.o. female [phone] Patient's last menstrual period was 07/14/2018.Marland Kitchen   She was referred by MFM because she  wasn't able to have her tubal at the time of her cesarean delivery on March 29, 2019  Today she reports she was supposed to have it done on L&D but didn't get approval through [facility] for delivery.  Prior abdominal operations: cesarean delivery March 29, 2019, no other operations, had an ectopic and managed by expectant management  Patient's cell phone: [phone]  Partner/other cell phone:  Joretta Bachelor [phone]  ??  Past Medical History        Past Medical History:   Diagnosis Date   ??? Abnormal mammogram ??   ??? Constipation ??   ??? Diarrhea ??   ??? Environmental allergies ??   ??? GDM- diet controlled 03/07/2019   ??? HIV (human immunodeficiency virus infection) (CMS-HCC) ??   ??? HIV (human immunodeficiency virus infection) (CMS-HCC) ??   ??? IUD (intrauterine device) in place ??   ??? Migraine ??   ??? Obesity ??   ??? Pseudotumor cerebri ??   ??? STD (sexually transmitted disease) ??        Past Surgical History         Past Surgical History:   Procedure Laterality Date   ??? DILATION AND CURETTAGE OF UTERUS ?? ??   ??? LUMBAR PUNCTURE ?? ??   ??? PR CESAREAN DELIVERY ONLY N/A 03/29/2019   ?? Procedure: CESAREAN DELIVERY ONLY;  Surgeon: Asher Muir, MD;  Location: L&D C-SECTION OR SUITES [facility];  Service: Maternal-Fetal Medicine   ??? PR COLONOSCOPY W/BIOPSY SINGLE/MULTIPLE ?? 07/03/2014   ?? Procedure: COLONOSCOPY, FLEXIBLE, PROXIMAL TO SPLENIC FLEXURE; WITH BIOPSY, SINGLE OR MULTIPLE;  Surgeon: Billie Ruddy, MD;  Location: [facility] PROCEDURES [facility];  Service: Gastroenterology   ??? PR DILATION/CURETTAGE,DIAGNOSTIC N/A 06/14/2018   ?? Procedure: DILATION AND CURETTAGE, DIAGNOSTIC AND/OR THERAPEUTIC (NON OBSTETRICAL);  Surgeon: Nelle Don, MD;  Location: [facility] OR [facility];  Service: [facility] Primary Gynecology   ??? PR UPPER GI ENDOSCOPY,BIOPSY N/A 07/03/2014   ?? Procedure: UGI ENDOSCOPY; WITH BIOPSY, SINGLE OR MULTIPLE;  Surgeon: Billie Ruddy, MD;  [facility]: [facility];  Service: Gastroenterology   ??? WISDOM TOOTH EXTRACTION ?? ??                          OB History   Gravida Para Term Preterm AB Living   11 2 1 1 9 2    SAB TAB Ectopic Molar Multiple Live Births    8   1   0 2   ??   # Outcome Date GA Lbr Len/2nd Weight Sex Delivery Anes PTL Lv   11 Preterm 03/29/19 [redacted]w[redacted]d ?? 1970 g (4 lb 5.5 oz) F CS-LTranv EPI, Spinal Y LIV      Complications: Fetal Intolerance   10 SAB 06/2018 ?? ?? ?? ?? ?? ?? ?? ??   9 SAB 2013 ?? ?? ?? ?? ?? ?? ?? ??   8 Ectopic 2013 ?? ?? ?? ?? ?? ?? ?? ??   7 SAB 2012 ?? ?? ?? ?? ?? ?? ?? ??   6 SAB 2012 ?? ?? ?? ?? ?? ?? ?? ??   5 SAB 2011 ?? ?? ?? ?? ?? ?? ?? ??   4 SAB 2010 ?? ?? ?? ?? ?? ?? ?? ??   3 SAB 2010 ?? ?? ?? ?? ?? ?? ?? ??   2 Term 06/28/06 ?? ?? 2693 g (5 lb 15 oz) F Vag-Spont ?? N LIV  1 SAB ?? ?? ?? ?? ?? ?? ?? ?? ??   ??   Obstetric Comments   OB-History reviewed by Lucilla Lame RN on 04/02/2019.   ??  ??  Current Outpatient Medications:   ???  acetaminophen (TYLENOL) 325 MG tablet, Take 2 tablets (650 mg total) by mouth Every six (6) hours., Disp: 120 tablet, Rfl: 0  ???  acetaZOLAMIDE (DIAMOX) 500 mg capsule, Take 500 mg by mouth., Disp: , Rfl:   ???  bictegrav-emtricit-tenofov ala (BIKTARVY) 50-200-25 mg tablet, Take 1 tablet by mouth daily., Disp: 30 tablet, Rfl: 11  ???  ibuprofen (MOTRIN) 600 MG tablet, Take 1 tablet (600 mg total) by mouth Every six (6) hours., Disp: 60 tablet, Rfl: 0  ???  ondansetron (ZOFRAN) 4 MG tablet, TAKE ONE TABLET BY MOUTH EVERY 4 HOURS AS NEEDED FOR NAUSEA AND VOMITING, Disp: 30 tablet, Rfl: 2  ???  polyethylene glycol (MIRALAX) 17 gram packet, Mix 1 packet (17 g) with 4 to 8 ounces of liquid and take by mouth daily., Disp: 30 packet, Rfl: 0  ???  prenatal vit no.124-iron-folic (PRENATAL VITAMIN) 27 mg iron- 800 mcg Tab, Take 1 tablet by mouth., Disp: , Rfl:   ???  senna-docusate (SENNOSIDES-DOCUSATE SODIUM) 8.6-50 mg, Take 1 tablet by mouth daily., Disp: 100 tablet, Rfl: 0  ???  SUMAtriptan (IMITREX) 50 MG tablet, Take 1 tablet (50 mg total) by mouth every two (2) hours as needed for migraine (Maximum 2 doses/day & 4 doses/week)., Disp: 16 tablet, Rfl: 2        Allergies   Allergen Reactions   ??? Peanut Other (See Comments)   ?? ?? Patient allergic to walnuts, cashews, pistachios and peanuts in excess.   ??  ??  Social History   Social History   ??        Socioeconomic History   ??? Marital status: Married   ?? ?? Spouse name: None   ??? Number of children: None   ??? Years of education: None   ??? Highest education level: None   Occupational History   ??? None   Social Needs   ??? Financial resource strain: None   ??? Food insecurity Worry: None   ?? ?? Inability: None   ??? Transportation needs   ?? ?? Medical: None   ?? ?? Non-medical: None   Tobacco Use   ??? Smoking status: Former Smoker   ?? ?? Types: Cigarettes   ??? Smokeless tobacco: Never Used   Substance and Sexual Activity   ??? Alcohol use: Not Currently   ?? ?? Alcohol/week: 0.0 standard drinks   ?? ?? Comment: rare   ??? Drug use: Not Currently   ?? ?? Types: Marijuana   ??? Sexual activity: Yes   ?? ?? Partners: Male   Lifestyle   ??? Physical activity   ?? ?? Days per week: None   ?? ?? Minutes per session: None   ??? Stress: None   Relationships   ??? Social connections   ?? ?? Talks on phone: None   ?? ?? Gets together: None   ?? ?? Attends religious service: None   ?? ?? Active member of club or organization: None   ?? ?? Attends meetings of clubs or organizations: None   ?? ?? Relationship status: None   Other Topics Concern   ??? None   Social History Narrative   ?? Patient lives in [place], [place] with her fiance and daughter.  She is not employed.  Family History         Family History   Problem Relation Age of Onset   ??? Cervical cancer Mother ??   ??? Diabetes Mother ??   ??? Asthma Mother ??   ??? Hypertension Mother ??   ??? Diabetes Maternal Aunt ??   ??? Hypertension Maternal Aunt ??   ??? Breast cancer Maternal Grandmother ??   ??? Cancer Maternal Grandmother ??   ??? No Known Problems Daughter ??   ??? Glaucoma Neg Hx ??        ??  REVIEW OF SYSTEMS : A comprehensive review of 10 systems was negative except for pertinent positives noted in HPI.  Objective  BP 111/86  - Pulse 87  - Temp 37.1 ??C (98.7 ??F)  - Wt 77.1 kg (170 lb)  - LMP 07/14/2018  - BMI 34.34 kg/m??  Body mass index is 34.34 kg/m??.  GENERAL:healthy  ABDOMEN:abdomen is soft without tenderness, masses, organomegaly or guarding

## 2019-06-04 NOTE — Unmapped (Signed)
OPERATIVE NOTE FOR LAPAROSCOPIC BILATERAL SALPINGECTOMY    Medical Record Number: [id number]  Patient: Morgan Hebert  Date of Surgery: 06/04/19     Surgeon: Beverely Pace  Assistant(s): Dr. Elyn Peers, Family Planning Clinical Fellow; Dr. Lorna Few, Resident physician    Procedure:  Laparoscopic bilateral salpingectomy    Preoperative Diagnosis:Desires sterilization    Postoperative Diagnosis: Desires sterilization    Findings:  1. Normal upper abdominal survey  2. Normal uterus  3. Normal fallopian tubes, carried out to the fimbriated ends and ovaries bilaterally. Small paratubal cysts on both fallopian tubes    Anesthesia: General endotracheal; local: .25% marcaine    IVF: 600 mL crystalloid  UOP: 200 mL  EBL: 5 mL    Details: The risks, benefits, and alternatives of the procedure (including, but not limited to bleeding, infection, damage to bowel, bladder, ureters, nerves, blood vessls and other organs, regret, and failure with possibility of future ectopic pregnancy) were discussed with the patient and informed consent was obtained. Because salpingectomy was being performed, the patient was specifically instructed that if she were to experience regret, the only option for subsequent fertility would be through in vitro fertilization. The patient understood.     Procedure:  The patient was taken to the operating room where general endotracheal anesthesia was obtained. The legs were placed in low lithotomy position in [place] stirrups, taking care not to hyperflex or hyperextend the hips or the knees. Her arms were tucked in the usual fashion in a military position.      She was prepped and a foley catheter was placed in the urinary bladder. A ZUMI uterine manipulator was secured in the uterus. She was draped in sterile fashion.     The skin at the umbilicus was infiltrated with 0.25% bupivacaine. A small incision was made. The abdomen was entered via direct visualization with a 5-mm trochar and the abdomen was insufflated. The abdomen and pelvis were surveyed and she was placed in Trendelenberg position. Two additional 5 mm ports were placed in the left and right lower quadrants after infiltration with 0.25% bupivicaine.     The fallopian tubes were identified and followed out to the fimbriated ends. The right fimbria were tented using an atraumatic grasper. The Ligasure was used to remove the entire fallopian tube along the mesosalpinx. The tube was removed from the abdomen. The left tube was then removed in a similar fashion.      Excellent hemostasis was noted. The abdomen was evacuated of carbon dioxide with the assistance of five manual breaths from the anesthesiologist and the three trocars were removed.     The skin incisions were closed with 4-0 Monocryl. The uterine manipulator and foley were removed. All sponges, instrumments, and sharps were counted and found to be correct at the end of the procedure. The patient was taken out of lithotomy position, extubated in the operating room, and transferred to the recovery room in stable condition.    Dr. Beverely Pace was present and scrubbed for th entire duration of the case.     Complications: none     Condition: stable to PACU    Specimens: bilateral fallopian tubes       Chandra Batch, MD, MPH  Complex Family Planning Fellow, [place]  OBGYN  Date: 06/04/2019  Time: 2:14 PM

## 2019-06-05 MED ORDER — KETOCONAZOLE 2 % TOPICAL CREAM
0 refills | 0 days | Status: CP
Start: 2019-06-05 — End: ?

## 2019-06-11 NOTE — Unmapped (Signed)
Last Visit Date: 05/15/2019  Next Visit Date: Recall Placed     Lab Results   Component Value Date    Hep B Surface Ag Nonreactive 09/26/2018    Hepatitis B Surface Ag Negative 02/13/2014    Hep B S Ab Reactive 02/13/2014    Hepatitis C Ab Nonreactive 10/22/2018    Hepatitis C Ab Negative 02/13/2014    HIV Antigen/Antibody Combo SEE BELOW (AA) 02/09/2014        No results found for this or any previous visit.    No results found for this or any previous visit.    No results found for this or any previous visit.    No results found for this or any previous visit.

## 2019-06-12 MED ORDER — SUMATRIPTAN 50 MG TABLET
ORAL_TABLET | ORAL | 2 refills | 2.00000 days | Status: CP | PRN
Start: 2019-06-12 — End: ?

## 2019-06-13 NOTE — Unmapped (Signed)
[facility] Pharm[facility] Refill Coordination Note    Specialty Medication(s) to be Shipped:   Infectious Disease: Biktarvy    Other medication(s) to be shipped: n/a     Morgan Hebert, DOB: [birth year]  Phone: [phone] (home) [phone] (work)      All above HIPAA information was verified with patient.     Completed refill call assessment today to schedule patient's medication shipment from the [facility] [phone]).       Specialty medication(s) and dose(s) confirmed: Regimen is correct and unchanged.   Changes to medications: Tracia reports no changes at this time.  Changes to insurance: No  Questions for the pharmacist: No    Confirmed patient received Welcome Packet with first shipment. The patient will receive a drug information handout for each medication shipped and additional FDA Medication Guides as required.       DISEASE/MEDICATION-SPECIFIC INFORMATION        N/A    SPECIALTY MEDICATION ADHERENCE     Medication Adherence    Patient reported X missed doses in the last month: 0  Specialty Medication: biktarvy          Patient not at home; unable to confirm quantity on hand.      SHIPPING     Shipping address confirmed in Epic.     Delivery Scheduled: Yes, Expected medication delivery date: 9/9.     Medication will be delivered via Next Day Courier to the home address in Epic WAM.    [name]   [facility] Specialty Technician

## 2019-06-18 ENCOUNTER — Encounter: Admit: 2019-06-18 | Discharge: 2019-06-19 | Payer: BLUE CROSS/BLUE SHIELD

## 2019-06-18 DIAGNOSIS — G932 Benign intracranial hypertension: Secondary | ICD-10-CM

## 2019-06-18 MED ORDER — ACETAZOLAMIDE ER 500 MG CAPSULE,EXTENDED RELEASE
ORAL_CAPSULE | Freq: Two times a day (BID) | ORAL | 5 refills | 30.00000 days | Status: CP
Start: 2019-06-18 — End: 2019-12-15

## 2019-06-18 MED ORDER — ACETAZOLAMIDE ER 500 MG CAPSULE,EXTENDED RELEASE: 1500 mg | capsule | Freq: Two times a day (BID) | 5 refills | 30 days | Status: AC

## 2019-06-18 MED FILL — BIKTARVY 50 MG-200 MG-25 MG TABLET: 30 days supply | Qty: 30 | Fill #7 | Status: AC

## 2019-06-18 MED FILL — BIKTARVY 50 MG-200 MG-25 MG TABLET: ORAL | 30 days supply | Qty: 30 | Fill #7

## 2019-06-18 NOTE — Unmapped (Signed)
Patient leaves a VM stating she needs a back to work letter from provider. Message sent to A.Baldemar Friday MD

## 2019-06-18 NOTE — Unmapped (Signed)
Morgan Hebert is a 33 y.o. 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    CC: blurry vision and loss of periferal vision.    Timeline:   12/2013 - Dx IIH. Headache, nausea, vomiting, migraines  - on/off since 2015. Diamox on/off - highest dose - 1500 BID, there was slight imrpovement.  05/2016: Laset seen by Dr. Cheryll Cockayne who noted no optic disc edema. OCT 12/01/15 showed Thinning of NFL temporally both eyes,  Left  > right, HVF: OD - WNL; OS peripheral depression of VF, new since the last exam in 01/2014. Plan was for follow-up in 3 months but patient was lost to f/u.  03/29/19: Gave birth to child. Patient noticed slight peripheral vision loss and blurred vision OU.    LP: 11/13/2013 - openning pressure: 42, closing 20 cm H2O  01/13/14, 01/08/2016, 01/01/18 - tried LP, but was not successful  There was one more LP during delivery - 03/29/19     Past Medical History: none    06/18/19: Height 4.11, Gain 30 lb during pregnancy, 175 lb    Previous images:  Decreased OCT RNLF and GCC OS>OD     Today's Exam:  Images (June 18, 2019):        MRI brain 11/13/2013, Dr. Katrinka Blazing:   IMPRESSION:Unremarkable MRI of the brain.    Assessment and plan:   - Blurry vision and loss of periferal vision in a patient with longstanding IIH that was on and off Diamox. She delivered a baby on 03/29/19 and she was a on Diamox 500 mg TID. However, no improvement with headaches and her periferal vision is getting worse. Onset of IIH was on 12/2013 and she had 2 LP done, first one was in 2015 and OP was 42 cm H2O and CP was 20 cm H20.     - Last MRI done in 2015, so I am planning to repeat MRI brain and orbits w and w/o contrast  - Refer to bariatric team for loosing weight   - Follow up in 1 month  - I have spent more than 50% of the provider's face-to-face visit time with a patient is spent in counseling or coordination of care.  - I was present and involved in all parts of the service and the documentation is mine.

## 2019-06-19 DIAGNOSIS — R112 Nausea with vomiting, unspecified: Secondary | ICD-10-CM

## 2019-06-19 MED ORDER — ONDANSETRON HCL 4 MG TABLET
ORAL_TABLET | 0 refills | 0 days | Status: CP
Start: 2019-06-19 — End: 2019-07-11

## 2019-06-21 NOTE — Unmapped (Signed)
Updated patient that a return to work note was faxed out to the number she provided and attention to August Dunlop with a return to work date of after 06/23/2019.

## 2019-07-04 DIAGNOSIS — B20 Human immunodeficiency virus [HIV] disease: Secondary | ICD-10-CM

## 2019-07-04 DIAGNOSIS — R21 Rash and other nonspecific skin eruption: Secondary | ICD-10-CM

## 2019-07-04 MED ORDER — KETOCONAZOLE 2 % TOPICAL CREAM
2 refills | 0 days | Status: CP
Start: 2019-07-04 — End: ?

## 2019-07-10 NOTE — Unmapped (Signed)
[facility] Refill Coordination Note    Specialty Medication(s) to be Shipped:   Infectious Disease: Biktarvy    Other medication(s) to be shipped:       Morgan Hebert, DOB: 11-May-1986  Phone: [phone] (home) [phone] (work)      All above HIPAA information was verified with patient.     Completed refill call assessment today to schedule patient's medication shipment from the [facility] [phone]).       Specialty medication(s) and dose(s) confirmed: Regimen is correct and unchanged.   Changes to medications: Briannah reports no changes at this time.  Changes to insurance: No  Questions for the pharmacist: No    Confirmed patient received Welcome Packet with first shipment. The patient will receive a drug information handout for each medication shipped and additional FDA Medication Guides as required.       DISEASE/MEDICATION-SPECIFIC INFORMATION        N/A    SPECIALTY MEDICATION ADHERENCE     Medication Adherence    Patient reported X missed doses in the last month: 0  Specialty Medication: Biktarvy 50-200-25mg   Patient is on additional specialty medications: No                Biktarvy 50-200-25 mg: 12 days of medicine on hand         SHIPPING     Shipping address confirmed in Epic.     Delivery Scheduled: Yes, Expected medication delivery date: 10/06.     Medication will be delivered via Next Day Courier to the home address in Epic WAM.    Antonietta Barcelona   [facility] Specialty Technician

## 2019-07-10 NOTE — Unmapped (Deleted)
Assessment/Plan:      Morgan Hebert, a 33 y.o. female seen today for routine HIV followup.    Plan:  HIV  Fills ART via private insurance. Due for HMAP renewal.  ?? Continue current therapy. E-prescribed at recent visit, no refills needed.   ?? Checking HIV RNA & safety labs (brief return)    ?? Encouraged continued excellent ARV adherence. We discussed that VL is <40 but detected, reinforced daily adherence.   Lab Results   Component Value Date    ACD4 1,118 10/22/2018    CD4 54 10/22/2018    HIVCP <40 (H) 02/25/2019    HIVRS Detected (A) 02/25/2019   ?? I don't see an indication for CD4 recheck in pregnancy - only reason would be to see if she needed OI prophylaxis which is not likely given high numbers.    Constipation  ?? Discussed recommendations from OB. Trying miralax and other products.  ?? Reinforced drinking plenty of water    Pregnancy  ?? EDD 04/25/19, next appt 03/07/19  ?? VL UTD, no need to recheck CD4.   ?? Reinforced need for ART adherence.     Pseudotumor cerebri  ?? Working with neurologists, symptoms are stable  ?? For ophtho followup.    Sexual health & secondary prevention  Sex with female partner (husband). Monogamous with single partner. She does disclose status. Never uses condoms.    Lab Results   Component Value Date    RPR Nonreactive 03/28/2019    CTNAA Negative 03/28/2019    CTNAA Negative 10/11/2018    CTNAA Negative 09/26/2018    GCNAA Negative 03/28/2019    GCNAA Negative 10/11/2018    GCNAA Negative 09/26/2018    SPECTYPE Swab 03/28/2019    SPECTYPE Swab 10/11/2018    SPECTYPE Urine 09/26/2018    SPECSOURCE Endocervix 03/28/2019    SPECSOURCE Endocervix 10/11/2018    SPECSOURCE Urine 09/26/2018     ?? GC/CT NAATs -- performed 09/2018   ?? RPR -- NR 09/2018 - repeat 1Y      Health maintenance  Lab Results   Component Value Date    CREATININE 0.69 03/29/2019    QFTTBGOLD NEGATIVE 02/13/2014    HEPCAB Nonreactive 10/22/2018    CHOL 140 03/28/2018    HDL 42 03/28/2018    LDL 78 03/28/2018    NONHDL 98 03/28/2018    TRIG 99 03/28/2018    A1C 5.2 10/22/2018    PAP Negative for intraephithelial lesion or malignancy 03/29/2017    FINALDX  03/29/2019     A: Placenta, delivery    - Preterm (36 weeks 1 days EGA) singleton third trimester placenta, weight 320 g (weight 10th percentile for EGA)   - No acute chorioamnionitis identified   - Three vessel umbilical cord with narrow diameter (0.9 cm) and a furcate insertion  - Placental plate: accelerated maturation of chorionic villi,  increased intervillous and subchorionic fibrin, focal fetal thrombotic vasculopathy, small placental infarction (1.2 cm)        This electronic signature is attestation that the pathologist personally reviewed the submitted material(s) and the final diagnosis reflects that evaluation.       Communicable diseases  # TB - no longer needed; negative IGRA 2015 and low/no risk  # HCV - negative 2018; rescreen w/Ab q1-2y    Cancer screening  # Anorectal - not yet done  # Colorectal - SCREEN AGE 59+  # Liver - not yet done  # Lung - not applicable    #  Breast - SCREEN AGE 22+ -- Q1-2Y  # Cervical - neg cyto June 2018 through [facility]. Due for repeat June 2020.    Cardiovascular disease  # The ASCVD Risk score Denman George DC Jorge Ny al., 2013) failed to calculate.    Immunization History   Administered Date(s) Administered   ??? Influenza Vaccine Quad (IIV4 PF) 33mo+ injectable 06/30/2015, 06/15/2016, 07/04/2018   ??? MMR 03/31/2019   ??? PNEUMOCOCCAL POLYSACCHARIDE 23 03/31/2019   ??? TdaP 02/05/2019     ?? Screening ordered today: none  ?? Immunizations ordered today: influenza    Disposition  Return to clinic 3-4 months or sooner if needed.    [name], MD, MPH   [facility]   [address] , [address]  [place], [place]. [address]  Phone: [phone]   Fax: [phone]      Subjective:      Chief Complaint   HIV followup    HPI  Return patient visit for Morgan Hebert, a 33 y.o. woman with now well-controlled HIV.  Had her daughter Morgan Hebert via cesarean section for NR NST on 03/29/19 (EDD was 04/25/19). Morgan Hebert was in the NICU for a bit but is doing ok.    Past Medical History:   Diagnosis Date   ??? Abnormal mammogram    ??? Constipation    ??? Diarrhea    ??? Environmental allergies    ??? GDM- diet controlled 03/07/2019   ??? HIV (human immunodeficiency virus infection) (CMS-HCC)    ??? HIV (human immunodeficiency virus infection) (CMS-HCC)    ??? IUD (intrauterine device) in place    ??? Migraine    ??? Obesity    ??? Pseudotumor cerebri    ??? STD (sexually transmitted disease)      Medications and Allergies   Reviewed and updated today. See bottom of this visit's encounter summary for details.  Current Outpatient Medications on File Prior to Visit   Medication Sig   ??? acetaminophen (TYLENOL) 325 MG tablet Take 2 tablets (650 mg total) by mouth Every six (6) hours.   ??? acetaZOLAMIDE (DIAMOX) 500 mg capsule Take 1 capsule (500 mg total) by mouth Two (2) times a day.   ??? acetaZOLAMIDE (DIAMOX) 500 mg capsule Take 3 capsules (1,500 mg total) by mouth Two (2) times a day.   ??? bictegrav-emtricit-tenofov ala (BIKTARVY) 50-200-25 mg tablet Take 1 tablet by mouth daily. (Patient taking differently: Take 1 tablet by mouth nightly. )   ??? [EXPIRED] docusate sodium (COLACE) 100 MG capsule Take 1 capsule (100 mg total) by mouth every 8 (eight) hours.   ??? ibuprofen (MOTRIN) 600 MG tablet Take 1 tablet (600 mg total) by mouth Every six (6) hours.   ??? ketoconazole (NIZORAL) 2 % cream APPLY AS DIRECTED TOPICALLY DAILY   ??? ondansetron (ZOFRAN) 4 MG tablet TAKE ONE TABLET BY MOUTH EVERY 4 HOURS AS NEEDED FOR NAUSEA AND VOMITING   ??? [EXPIRED] oxyCODONE-acetaminophen (PERCOCET) 5-325 mg per tablet Take 2 tablets by mouth every four (4) hours as needed for pain for up to 5 days. 1 to 2 tablets as needed   ??? prenatal vit no.124-iron-folic (PRENATAL VITAMIN) 27 mg iron- 800 mcg Tab Take 1 tablet by mouth.   ??? promethazine (PHENERGAN) 25 MG tablet Take 1 tablet (25 mg total) by mouth every six (6) hours as needed for nausea (migraine headache).   ??? SUMAtriptan (IMITREX) 50 MG tablet Take 1 tablet (50 mg total) by mouth every two (2) hours as  needed for migraine (Maximum 2 doses/day & 4 doses/week).     No current facility-administered medications on file prior to visit.      Allergies   Allergen Reactions   ??? 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     Social History  General ??? lives in [place] with her husband and her 1 daughter (born 2008). There is financial tension.   ?? Doreene Adas and his girlfriend are there sometimes as well.   ?? Mom and sister are both positive (sister is perinatally infected, patient with presumed sexual transmission from female partner).  ?? 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  Sexual History ??? sex with men (husband only)  Substance Use ??? marijuana (smokes nightly)  Social History     Tobacco Use   ??? Smoking status: Former Smoker     Types: Cigarettes   ??? Smokeless tobacco: Never Used   Substance Use Topics   ??? Alcohol use: Not Currently     Alcohol/week: 0.0 standard drinks     Comment: rare     Review of Systems  As per HPI. Remainder of 10 systems reviewed, negative.      Objective:      There were no vitals taken for this visit.  Wt Readings from Last 3 Encounters:   05/15/19 76.7 kg (169 lb)   04/18/19 77.1 kg (170 lb)   03/29/19 80.7 kg (178 lb)     Phone visit    Laboratory Data  Reviewed in Epic today, using Synopsis and Chart Review filters.    Lab Results   Component Value Date    CREATININE 0.69 03/29/2019    QFTTBGOLD NEGATIVE 02/13/2014    HEPCAB Nonreactive 10/22/2018    CHOL 140 03/28/2018    HDL 42 03/28/2018    LDL 78 03/28/2018    NONHDL 98 03/28/2018    TRIG 99 03/28/2018    A1C 5.2 10/22/2018    PAP Negative for intraephithelial lesion or malignancy 03/29/2017    FINALDX  03/29/2019     A: Placenta, delivery    - Preterm (36 weeks 1 days EGA) singleton third trimester placenta, weight 320 g (weight 10th percentile for EGA)   - No acute chorioamnionitis identified   - Three vessel umbilical cord with narrow diameter (0.9 cm) and a furcate insertion  - Placental plate: accelerated maturation of chorionic villi,  increased intervillous and subchorionic fibrin, focal fetal thrombotic vasculopathy, small placental infarction (1.2 cm)        This electronic signature is attestation that the pathologist personally reviewed the submitted material(s) and the final diagnosis reflects that evaluation.              _____________________________________________________________________

## 2019-07-11 DIAGNOSIS — R112 Nausea with vomiting, unspecified: Secondary | ICD-10-CM

## 2019-07-11 MED ORDER — ONDANSETRON HCL 4 MG TABLET
ORAL_TABLET | 0 refills | 0 days | Status: CP
Start: 2019-07-11 — End: ?

## 2019-07-15 MED FILL — BIKTARVY 50 MG-200 MG-25 MG TABLET: ORAL | 30 days supply | Qty: 30 | Fill #8

## 2019-07-15 MED FILL — BIKTARVY 50 MG-200 MG-25 MG TABLET: 30 days supply | Qty: 30 | Fill #8 | Status: AC

## 2019-07-18 ENCOUNTER — Encounter (HOSPITAL_COMMUNITY): Payer: Self-pay

## 2019-08-09 NOTE — Unmapped (Signed)
[facility] Refill Coordination Note    Specialty Medication(s) to be Shipped:   Infectious Disease: Biktarvy    Other medication(s) to be shipped: none     Morgan Hebert, DOB: 04/05/1986  Phone: [phone] (home) [phone] (work)      All above HIPAA information was verified with patient.     Completed refill call assessment today to schedule patient's medication shipment from the [facility] [id number]).       Specialty medication(s) and dose(s) confirmed: Regimen is correct and unchanged.   Changes to medications: Morgan Hebert reports no changes at this time.  Changes to insurance: No  Questions for the pharmacist: No    Confirmed patient received Welcome Packet with first shipment. The patient will receive a drug information handout for each medication shipped and additional FDA Medication Guides as required.       DISEASE/MEDICATION-SPECIFIC INFORMATION        N/A    SPECIALTY MEDICATION ADHERENCE     Medication Adherence    Patient reported X missed doses in the last month: 0  Specialty Medication: Biktarvy  Patient is on additional specialty medications: No                Biktarvy 50-200-25 mg: 7 days of medicine on hand         SHIPPING     Shipping address confirmed in Epic.     Delivery Scheduled: Yes, Expected medication delivery date: 08/14/19.     Medication will be delivered via Next Day Courier to the home address in Epic WAM.    Unk Lightning   [facility] Specialty Technician

## 2019-08-13 MED FILL — BIKTARVY 50 MG-200 MG-25 MG TABLET: ORAL | 30 days supply | Qty: 30 | Fill #9

## 2019-08-13 MED FILL — BIKTARVY 50 MG-200 MG-25 MG TABLET: 30 days supply | Qty: 30 | Fill #9 | Status: AC

## 2019-08-14 ENCOUNTER — Encounter: Admit: 2019-08-14 | Discharge: 2019-08-15 | Payer: BLUE CROSS/BLUE SHIELD

## 2019-08-14 NOTE — Unmapped (Signed)
Assessment/Plan:      [name], a 33 y.o. female called today for routine HIV followup.    Plan:  HIV  Fills ART via private insurance. RW renewal needed for this period.  ?? Continue current therapy. E-prescribed at recent visit, no refills needed. She uses [facility].  ?? No labs today    ?? Encouraged continued excellent ARV adherence.   Lab Results   Component Value Date    ACD4 1,118 10/22/2018    CD4 54 10/22/2018    HIVCP <40 (H) 02/25/2019    HIVRS Detected (A) 02/25/2019   She is interested in establishing primary care with Dr. Marguerita Beards now that she had had her daughter. I have placed referral today.    Cough  ?? Recommended SARS-CoV-2 testing ASAP, reviewed use of mask around everyone at home. Number provided to patient.     Hair loss  ?? Likely telogenic effluvium postpartum. Counseled re this process and avoiding additional exacerbating factors (eg braiding,pulling back).    Mental Health  ?? Provided support surrounding separation with her husband. She is interested in finding a therapist. I have asked our SW team for help with this. Overall doing well and feels safe.     Pseudotumor cerebri  ?? Working with neurologists, symptoms are stable.  ?? For ophtho followup.    Sexual health & secondary prevention  Sex with female partner (husband). Monogamous with single partner. She does disclose status. Never uses condoms.    Lab Results   Component Value Date    RPR Nonreactive 03/28/2019    CTNAA Negative 03/28/2019    CTNAA Negative 10/11/2018    CTNAA Negative 09/26/2018    GCNAA Negative 03/28/2019    GCNAA Negative 10/11/2018    GCNAA Negative 09/26/2018    SPECTYPE Swab 03/28/2019    SPECTYPE Swab 10/11/2018    SPECTYPE Urine 09/26/2018    SPECSOURCE Endocervix 03/28/2019    SPECSOURCE Endocervix 10/11/2018    SPECSOURCE Urine 09/26/2018     ?? GC/CT NAATs -- negative from cervical swab 03/2019   ?? RPR -- NR 03/2019 - repeat 1Y      Health maintenance  Lab Results   Component Value Date    CREATININE 0.69 03/29/2019    QFTTBGOLD NEGATIVE 02/13/2014    HEPCAB Nonreactive 10/22/2018    CHOL 140 03/28/2018    HDL 42 03/28/2018    LDL 78 03/28/2018    NONHDL 98 03/28/2018    TRIG 99 03/28/2018    A1C 5.2 10/22/2018    PAP Negative for intraephithelial lesion or malignancy 03/29/2017    FINALDX  03/29/2019     A: Placenta, delivery    - Preterm (36 weeks 1 days EGA) singleton third trimester placenta, weight 320 g (weight 10th percentile for EGA)   - No acute chorioamnionitis identified   - Three vessel umbilical cord with narrow diameter (0.9 cm) and a furcate insertion  - Placental plate: accelerated maturation of chorionic villi,  increased intervillous and subchorionic fibrin, focal fetal thrombotic vasculopathy, small placental infarction (1.2 cm)        This electronic signature is attestation that the pathologist personally reviewed the submitted material(s) and the final diagnosis reflects that evaluation.       Communicable diseases  # TB - no longer needed; negative IGRA 2015 and low/no risk  # HCV - negative 2018; rescreen w/Ab q1-2y    Cancer screening  # Anorectal - not yet done  # Colorectal -  SCREEN AGE 63+  # Liver - not yet done  # Lung - not applicable    # Breast - SCREEN AGE 34+ -- Q1-2Y  # Cervical - neg cyto June 2018 through [facility]. Overdue for repeat June 2020.    Cardiovascular disease  # The ASCVD Risk score Denman George DC Jorge Ny al., 2013) failed to calculate.    Immunization History   Administered Date(s) Administered   ??? Influenza Vaccine Quad (IIV4 PF) 40mo+ injectable 06/30/2015, 06/15/2016, 07/04/2018   ??? MMR 03/31/2019   ??? PNEUMOCOCCAL POLYSACCHARIDE 23 03/31/2019   ??? TdaP 02/05/2019     ?? Screening ordered today: none  ?? Immunizations ordered today: none - will message pt to ask about current flu shot.    I spent 20 minutes on the phone with the patient. I spent an additional 10 minutes on pre- and post-visit activities.     The patient was physically located in [place] or a state in which I am permitted to provide care. The patient and/or parent/gauardian understood that s/he may incur co-pays and cost sharing, and agreed to the telemedicine visit. The visit was completed via phone and/or video, which was appropriate and reasonable under the circumstances given the patient's presentation at the time.    The patient and/or parent/guardian has been advised of the potential risks and limitations of this mode of treatment (including, but not limited to, the absence of in-person examination) and has agreed to be treated using telemedicine. The patient's/patient's family's questions regarding telemedicine have been answered.     If the phone/video visit was completed in an ambulatory setting, the patient and/or parent/guardian has also been advised to contact their provider???s office for worsening conditions, and seek emergency medical treatment and/or call 911 if the patient deems either necessary.    Counseled as documented above regarding medication adherence.    Disposition  Return to clinic 3-4 months or sooner if needed.    [name], MD, MPH   [facility]   [address], 1st floor   [place], [place]. [address]   Phone: [phone]   Fax: [phone]      Subjective:      Chief Complaint   HIV followup    HPI  Return patient visit for LISAANN ATHA, a 33 y.o. woman with now well-controlled HIV.  Delivered daughter [name] on 03/29/19 via urgent c-section. The baby is doing well and is in daycare. Older daughter is at home in school.   Danicka is back at work. Able to wear a mask at work and when she sees others.  Concerned about hair loss on sides of her head.  She has had a persistent cough for 2 weeks, no fevers or dyspnea, no change in taste or smell.  Several life stressors: Her aunt is dying of cancer and is in hospice in [place]. Kandie had had a falling out with her and is glad she got to see her but it was upsetting. She is also separating from her husband. Things are amicable but it is still difficult.     Past Medical History:   Diagnosis Date   ??? Abnormal mammogram    ??? Constipation    ??? Diarrhea    ??? Environmental allergies    ??? GDM- diet controlled 03/07/2019   ??? HIV (human immunodeficiency virus infection) (CMS-HCC)    ??? HIV (human immunodeficiency virus infection) (CMS-HCC)    ??? IUD (intrauterine device) in place    ???  Migraine    ??? Obesity    ??? Pseudotumor cerebri    ??? STD (sexually transmitted disease)      Medications and Allergies   Reviewed and updated today. See bottom of this visit's encounter summary for details.  Current Outpatient Medications on File Prior to Visit   Medication Sig   ??? acetaminophen (TYLENOL) 325 MG tablet Take 2 tablets (650 mg total) by mouth Every six (6) hours.   ??? [EXPIRED] acetaZOLAMIDE (DIAMOX) 500 mg capsule Take 1 capsule (500 mg total) by mouth Two (2) times a day.   ??? acetaZOLAMIDE (DIAMOX) 500 mg capsule Take 3 capsules (1,500 mg total) by mouth Two (2) times a day.   ??? bictegrav-emtricit-tenofov ala (BIKTARVY) 50-200-25 mg tablet Take 1 tablet by mouth daily. (Patient taking differently: Take 1 tablet by mouth nightly. )   ??? ibuprofen (MOTRIN) 600 MG tablet Take 1 tablet (600 mg total) by mouth Every six (6) hours.   ??? ketoconazole (NIZORAL) 2 % cream APPLY AS DIRECTED TOPICALLY DAILY   ??? ondansetron (ZOFRAN) 4 MG tablet TAKE ONE TABLET BY MOUTH EVERY 4 HOURS AS NEEDED FOR NAUSEA AND VOMITING   ??? prenatal vit no.124-iron-folic (PRENATAL VITAMIN) 27 mg iron- 800 mcg Tab Take 1 tablet by mouth.   ??? promethazine (PHENERGAN) 25 MG tablet Take 1 tablet (25 mg total) by mouth every six (6) hours as needed for nausea (migraine headache).   ??? SUMAtriptan (IMITREX) 50 MG tablet Take 1 tablet (50 mg total) by mouth every two (2) hours as needed for migraine (Maximum 2 doses/day & 4 doses/week).     No current facility-administered medications on file prior to visit.      Allergies   Allergen Reactions   ??? Peanut Other (See Comments) Patient allergic to walnuts, cashews, pistachios and peanuts in excess.     Social History  General ??? lives in [place] with her husband and her 2 daughters (born 2008, 2020). There is financial tension, they are separating.   ?? Doreene Adas and his girlfriend are there sometimes as well.   ?? Mom and sister are both positive (sister is perinatally infected, patient with presumed sexual transmission from female partner).  ?? 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 Loves vampire shows like True Blood.   Sexual History ??? sex with men   Substance Use ??? marijuana (smokes nightly)  Social History     Tobacco Use   ??? Smoking status: Former Smoker     Types: Cigarettes   ??? Smokeless tobacco: Never Used   Substance Use Topics   ??? Alcohol use: Not Currently     Alcohol/week: 0.0 standard drinks     Comment: rare     Review of Systems  As per HPI. Remainder of 10 systems reviewed, negative.      Objective:      There were no vitals taken for this visit.  Wt Readings from Last 3 Encounters:   05/15/19 76.7 kg (169 lb)   04/18/19 77.1 kg (170 lb)   03/29/19 80.7 kg (178 lb)     Video visit  Appears well, happy and talkative  Has thinning, short hair on sides of head, no alopecia  No other rashes    Laboratory Data  Reviewed in Epic today, using Synopsis and Chart Review filters.    Lab Results   Component Value Date    CREATININE 0.69 03/29/2019    QFTTBGOLD NEGATIVE 02/13/2014  HEPCAB Nonreactive 10/22/2018    CHOL 140 03/28/2018    HDL 42 03/28/2018    LDL 78 03/28/2018    NONHDL 98 03/28/2018    TRIG 99 03/28/2018    A1C 5.2 10/22/2018    PAP Negative for intraephithelial lesion or malignancy 03/29/2017    FINALDX  03/29/2019     A: Placenta, delivery    - Preterm (36 weeks 1 days EGA) singleton third trimester placenta, weight 320 g (weight 10th percentile for EGA)   - No acute chorioamnionitis identified   - Three vessel umbilical cord with narrow diameter (0.9 cm) and a furcate insertion  - Placental plate: accelerated maturation of chorionic villi,  increased intervillous and subchorionic fibrin, focal fetal thrombotic vasculopathy, small placental infarction (1.2 cm)        This electronic signature is attestation that the pathologist personally reviewed the submitted material(s) and the final diagnosis reflects that evaluation.              _____________________________________________________________________

## 2019-08-14 NOTE — Unmapped (Signed)
It was great to see you today.    I am worried about your cough and think you should get a COVID test. Please call [place] [facility] at [phone].    COVID-19 information:  ? Please continue to distance yourself physically from others when possible. Wear a mask (cloth or disposable surgical mask is fine) when outside the house.   ? [facility] can provide care advice or information about coronavirus, 24 hours a day, 7 days a week. Call the [facility] at [phone]. [email or web address].com/  ? As discussed, I would recommend wearing eye protection in addition to your mask (which protects others). Eye protection is important if someone else cannot wear a mask.  Regular glasses do not provide enough protection - you need something that covers the sides as well, like this (or any eye protection for yard work, carpentry, Catering manager) like any of these  A face shield would also be appropriate if you prefer!           Please note:  New Clinic Location at [facility] 1: [address] , [address] [place], [place] -  [address]    Contacting us   During working hours  [phone]  After hours or weekends [phone] and ask for the ID doctor on call  Fax number   [phone]    MEDICATIONS  For refills, please contact your pharmacy and ask them to electronically send or fax the request to the clinic.     Please bring all medications in original bottles to every appointment.    [facility] (formerly [facility]) or [employer] Eligibility (required even if you do not receive medication through [facility])  Please remember to renew your Juanell Fairly eligibility during renewal periods which occur twice a year: January-March and July-September.     The following are needed for each renewal:   - [facility] Identification (if you don't have one, then a bill with your name and address in [place])   - proof of income (award letter, W-2, or last three check stubs) If you are unable to come in for renewal, let us know if we can mail, fax or e-mail paperwork to you.   [facility]MAP Contact: [phone]      Urgent Care Clinic  Monday, Tuesday, and Thursday from 8:30 - 12 noon  Please call ahead to speak with the nursing staff if you think you need to be seen urgently!    Lab info:  Your most recent CD4 T-cell counts and viral loads are below. Here are a few things to keep in mind when looking at your numbers:  ?? For most people, we're checking CD4 counts fairly infrequently (once a year or less)  ?? It's normal for your CD4 count to be different from visit to visit.   ?? We consider your viral load to be undetectable if it says <40 or if it says Not detected.  ?? Our goal is to get your virus to be undetectable and keep it undetectable. You can help by taking your medications at about the same time, every single day. If you're having trouble with taking your medications, it's important to let us know.    YOUR RECENT LAB RESULTS:  CD4 and VIRAL LOAD  Lab Results   Component Value Date    ACD4 1,118 10/22/2018    HIVRS Detected (A) 02/25/2019       [name], MD, MPH   [facility]  [address] , [address], [place]. [address]  Phone: [phone]   Fax: [phone]

## 2019-08-20 DIAGNOSIS — R112 Nausea with vomiting, unspecified: Principal | ICD-10-CM

## 2019-08-21 DIAGNOSIS — R112 Nausea with vomiting, unspecified: Principal | ICD-10-CM

## 2019-08-27 NOTE — Unmapped (Signed)
Social Work Referral Services Note Mental Health     Duration of Intervention: 5 minutes    SW received message from provider that pt is struggling with mental health due to divorce and family stress and may be interested in counseling. SW called pt but no answer and voicemail was not set up. SW will send MyChart message.     Lilli Few, CHES  [facility] Clinic Social Work   [phone]

## 2019-09-03 NOTE — Unmapped (Signed)
[facility] Refill Coordination Note    Specialty Medication(s) to be Shipped:   Infectious Disease: Biktarvy    Other medication(s) to be shipped: n/a     Morgan Hebert, DOB: [birth year]  Phone: [phone] (home) [phone] (work)      All above HIPAA information was verified with patient.     Completed refill call assessment today to schedule patient's medication shipment from the [facility] [phone]).       Specialty medication(s) and dose(s) confirmed: Regimen is correct and unchanged.   Changes to medications: Ayven reports no changes at this time.  Changes to insurance: No  Questions for the pharmacist: No    Confirmed patient received Welcome Packet with first shipment. The patient will receive a drug information handout for each medication shipped and additional FDA Medication Guides as required.       DISEASE/MEDICATION-SPECIFIC INFORMATION        N/A    SPECIALTY MEDICATION ADHERENCE     Medication Adherence    Patient reported X missed doses in the last month: 0  Specialty Medication: Biktarvy  Patient is on additional specialty medications: No                Biktarvy 50-200-25 mg: 10 days of medicine on hand       SHIPPING     Shipping address confirmed in Epic.     Delivery Scheduled: Yes, Expected medication delivery date: 09/11/19.     Medication will be delivered via Next Day Courier to the prescription address in Epic WAM.    Alson Mcpheeters  Anders Grant   [facility] Specialty Pharmacist

## 2019-09-10 MED FILL — BIKTARVY 50 MG-200 MG-25 MG TABLET: 30 days supply | Qty: 30 | Fill #10 | Status: AC

## 2019-09-10 MED FILL — BIKTARVY 50 MG-200 MG-25 MG TABLET: ORAL | 30 days supply | Qty: 30 | Fill #10

## 2019-09-13 NOTE — Unmapped (Deleted)
Internal Medicine Initial Visit        Assessment/Plan:       There are no diagnoses linked to this encounter.     Cardiovascular  # The ASCVD Risk score Denman George DC Jorge Ny al., 2013) failed to calculate.    Health Maintenance  Health Maintenance   Topic Date Due   ??? Influenza Vaccine (1) 06/11/2019   ??? Pap Smear (21-65)  05/08/2020   ??? DTaP/Tdap/Td Vaccines (2 - Td) 02/04/2029   ??? Hepatitis C Screen  Completed          No follow-ups on file.  Next Visit:   ???     Chief Complaint:      Morgan Hebert is a 33 y.o. female who presents to Establish Care for ***      Subjective:     HPI  Pt has history of GDM, psueodotumor cerebri, obesity on background of HIV.   HIV: On Biktarvy  Patient Active Problem List   Diagnosis   ??? Pseudotumor cerebri   ??? HIV   ??? Lymphadenopathy   ??? Supervision of high risk pregnancy, unspecified, unspecified trimester   ??? Encounter for procreative genetic counseling   ??? Rubella non-immune status, antepartum   ??? Threatened premature labor in third trimester   ??? GDM- diet controlled         Current Outpatient Medications:   ???  acetaminophen (TYLENOL) 325 MG tablet, Take 2 tablets (650 mg total) by mouth Every six (6) hours., Disp: 120 tablet, Rfl: 0  ???  acetaZOLAMIDE (DIAMOX) 500 mg capsule, Take 3 capsules (1,500 mg total) by mouth Two (2) times a day., Disp: 180 capsule, Rfl: 5  ???  bictegrav-emtricit-tenofov ala (BIKTARVY) 50-200-25 mg tablet, Take 1 tablet by mouth daily. (Patient taking differently: Take 1 tablet by mouth nightly. ), Disp: 30 tablet, Rfl: 11  ???  ibuprofen (MOTRIN) 600 MG tablet, Take 1 tablet (600 mg total) by mouth Every six (6) hours., Disp: 60 tablet, Rfl: 0  ???  ketoconazole (NIZORAL) 2 % cream, APPLY AS DIRECTED TOPICALLY DAILY, Disp: 15 g, Rfl: 2  ???  ondansetron (ZOFRAN) 4 MG tablet, TAKE ONE TABLET BY MOUTH EVERY 4 HOURS AS NEEDED FOR NAUSEA AND VOMITING, Disp: 30 tablet, Rfl: 0  ???  prenatal vit no.124-iron-folic (PRENATAL VITAMIN) 27 mg iron- 800 mcg Tab, Take 1 tablet by mouth., Disp: , Rfl:   ???  promethazine (PHENERGAN) 25 MG tablet, Take 1 tablet (25 mg total) by mouth every six (6) hours as needed for nausea (migraine headache)., Disp: 30 tablet, Rfl: 5  ???  SUMAtriptan (IMITREX) 50 MG tablet, Take 1 tablet (50 mg total) by mouth every two (2) hours as needed for migraine (Maximum 2 doses/day & 4 doses/week)., Disp: 16 tablet, Rfl: 2     Sex with {Blank single:19197::men and women,women,men}. {Blank single:19197::Not in relationship,Monogamous with single partner,Open relationship,Single, new casual partner(s), n=***}. She {does/does not:200015} disclose status. {Blank single:19197::Never,Sometimes,Always} uses condoms.  Morgan Hebert  reports that she has quit smoking. Her smoking use included cigarettes. She has never used smokeless tobacco. She reports previous alcohol use. She reports previous drug use. Drug: Marijuana.     Exercise:   Nutrition:     The past medical history, surgical history, family history, social history, medications and allergies were reviewed in Epic.     Review of Systems    The balance of 10 systems was reviewed and unremarkable except as stated above.  Objective:   PHYSICAL EXAMINATION:  There were no vitals taken for this visit.  General: Well appearing, no acute distress  HEENT: Masked.  No cervical LAD, No thyromegaly or nodules.  Chest: no chest wall deformities  Pulm: clear to auscultation  CV: RRR, no murmurs, gallops or rubs  Abd: S, NT, ND, No HSM.  Ext: no edema  Skin: No rashes  Neuro: Normal gait; strength normal, grossly  Psych: nl mood and affect  MSK:***    ***    Records Review  Wt Readings from Last 3 Encounters:   05/15/19 76.7 kg (169 lb)   04/18/19 77.1 kg (170 lb)   03/29/19 80.7 kg (178 lb)     Last   Lab Results   Component Value Date    ACD4 1,118 10/22/2018      Lab Results   Component Value Date    HIVRS Detected (A) 02/25/2019     Lab Results   Component Value Date    CREATININE 0.69 03/29/2019 CHOL 140 03/28/2018    HDL 42 03/28/2018    LDL 78 03/28/2018    NONHDL 98 03/28/2018    TRIG 99 03/28/2018    A1C 5.2 10/22/2018    PAP Negative for intraephithelial lesion or malignancy 03/29/2017      Health Maintenance Due   Topic Date Due   ??? Influenza Vaccine (1) 06/11/2019      The ASCVD Risk score Denman George DC Montez Hageman, et al., 2013) failed to calculate.   Medication adherence and barriers to the treatment plan have been addressed. Opportunities to optimize healthy behaviors have been discussed. Patient / caregiver voiced understanding.

## 2019-09-19 NOTE — Unmapped (Signed)
Duration of Intervention: 15 minutes  Date:  09/19/2019    Reason: SW called [name] today regarding mental health referral.     Assessment: [name] indicated at last provider visit that she was experiencing some emotional difficulty and needs assistance with referral to local mental health services.  Pt's primary insurance is [employer] and she has supplementary Medicaid.    Intervention:SW assessed pt's mental health needs.  SW researched and then provided patient with information about the following [facility] Mental Health Providers via MyChart (per pt request)       [facility]  [address]  [place], [place] [address]  Call Superior  [phone]    Dorenza Frederick  [address]  [place], [place] [address]  [phone]    Ronda Fairly  [address]  [place], [place] [address]  [phone]    Durenda Hurt  [address]  [address] [place], [place] [address]  [phone]    Plan/Next Steps: Patient will contact SW if she would like assistance contacting therapists listed above to schedule initial consult      Arlyn Dunning, MSW, LCSW

## 2019-09-26 NOTE — Unmapped (Signed)
[facility] Clinical Assessment & Refill Coordination Note    Morgan Hebert, DOB: 1986-09-21  Phone: [phone] (home) [phone] (work)    All above HIPAA information was verified with patient.     Was a Nurse, learning disability used for this call? No    Specialty Medication(s):   Infectious Disease: Biktarvy     Current Outpatient Medications   Medication Sig Dispense Refill   ??? acetaminophen (TYLENOL) 325 MG tablet Take 2 tablets (650 mg total) by mouth Every six (6) hours. 120 tablet 0   ??? acetaZOLAMIDE (DIAMOX) 500 mg capsule Take 3 capsules (1,500 mg total) by mouth Two (2) times a day. 180 capsule 5   ??? bictegrav-emtricit-tenofov ala (BIKTARVY) 50-200-25 mg tablet Take 1 tablet by mouth daily. (Patient taking differently: Take 1 tablet by mouth nightly. ) 30 tablet 11   ??? ibuprofen (MOTRIN) 600 MG tablet Take 1 tablet (600 mg total) by mouth Every six (6) hours. 60 tablet 0   ??? ketoconazole (NIZORAL) 2 % cream APPLY AS DIRECTED TOPICALLY DAILY 15 g 2   ??? ondansetron (ZOFRAN) 4 MG tablet TAKE ONE TABLET BY MOUTH EVERY 4 HOURS AS NEEDED FOR NAUSEA AND VOMITING 30 tablet 0   ??? prenatal vit no.124-iron-folic (PRENATAL VITAMIN) 27 mg iron- 800 mcg Tab Take 1 tablet by mouth.     ??? promethazine (PHENERGAN) 25 MG tablet Take 1 tablet (25 mg total) by mouth every six (6) hours as needed for nausea (migraine headache). 30 tablet 5   ??? SUMAtriptan (IMITREX) 50 MG tablet Take 1 tablet (50 mg total) by mouth every two (2) hours as needed for migraine (Maximum 2 doses/day & 4 doses/week). 16 tablet 2     No current facility-administered medications for this visit.         Changes to medications: Nikolina reports no changes at this time.    Allergies   Allergen Reactions   ??? 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       Changes to allergies: No    SPECIALTY MEDICATION ADHERENCE Biktarvy 50-200-25 mg: unable to count days of medicine on hand but she thinks enough until after xmas         Specialty medication(s) dose(s) confirmed: Regimen is correct and unchanged.     Are there any concerns with adherence? No    Adherence counseling provided? Not needed    CLINICAL MANAGEMENT AND INTERVENTION      Clinical Benefit Assessment:    Do you feel the medicine is effective or helping your condition? Patient declined to answer    Clinical Benefit counseling provided? n/a    Adverse Effects Assessment:    Are you experiencing any side effects? No    Are you experiencing difficulty administering your medicine? No    Quality of Life Assessment:    How many days over the past month did your HIV  keep you from your normal activities? For example, brushing your teeth or getting up in the morning. 0    Have you discussed this with your provider? Not needed    Therapy Appropriateness:    Is therapy appropriate? Yes, therapy is appropriate and should be continued    DISEASE/MEDICATION-SPECIFIC INFORMATION      N/A    PATIENT SPECIFIC NEEDS     ? Does the patient have any physical, cognitive, or cultural barriers? No    ? Is the patient  high risk? Yes, pregnant/nursing patient. Contraindications have been assessed.     ? Does the patient require a Care Management Plan? No     ? Does the patient require physician intervention or other additional services (i.e. nutrition, smoking cessation, social work)? No      SHIPPING     Specialty Medication(s) to be Shipped:   Infectious Disease: Biktarvy    Other medication(s) to be shipped: n/a     Changes to insurance: No    Delivery Scheduled: Yes, Expected medication delivery date: 10/09/19.     Medication will be delivered via Next Day Courier to the confirmed prescription address in [facility]. The patient will receive a drug information handout for each medication shipped and additional FDA Medication Guides as required.  Verified that patient has previously received a Conservation officer, historic buildings.    All of the patient's questions and concerns have been addressed.    Lorrie Strauch Vangie Bicker   [facility] Specialty Pharmacist

## 2019-10-15 MED FILL — BIKTARVY 50 MG-200 MG-25 MG TABLET: 30 days supply | Qty: 30 | Fill #11 | Status: AC

## 2019-10-15 MED FILL — BIKTARVY 50 MG-200 MG-25 MG TABLET: ORAL | 30 days supply | Qty: 30 | Fill #11

## 2019-10-15 NOTE — Unmapped (Deleted)
Internal Medicine Initial Visit        Assessment/Plan:       There are no diagnoses linked to this encounter.     Cardiovascular  # The ASCVD Risk score Denman George DC Jorge Ny al., 2013) failed to calculate.    Health Maintenance  Health Maintenance   Topic Date Due   ??? Influenza Vaccine (1) 06/11/2019   ??? Pap Smear (21-65)  05/08/2020   ??? DTaP/Tdap/Td Vaccines (2 - Td) 02/04/2029   ??? Hepatitis C Screen  Completed          No follow-ups on file.  Next Visit:   ?     Chief Complaint:      Morgan Hebert is a 34 y.o. female who presents to Establish Care for Primary Care      Subjective:     HPI  Pt has obesity, pseudotumor cerebri on background of HIV.   HIV: Followed by Dr [name], on [place]. VL< 40 in 02/2019  Pap?   Flu Vaccine    Patient Active Problem List   Diagnosis   ??? Pseudotumor cerebri   ??? HIV   ??? Lymphadenopathy   ??? Supervision of high risk pregnancy, unspecified, unspecified trimester   ??? Encounter for procreative genetic counseling   ??? Rubella non-immune status, antepartum   ??? Threatened premature labor in third trimester   ??? GDM- diet controlled         Current Outpatient Medications:   ???  acetaminophen (TYLENOL) 325 MG tablet, Take 2 tablets (650 mg total) by mouth Every six (6) hours., Disp: 120 tablet, Rfl: 0  ???  acetaZOLAMIDE (DIAMOX) 500 mg capsule, Take 3 capsules (1,500 mg total) by mouth Two (2) times a day., Disp: 180 capsule, Rfl: 5  ???  bictegrav-emtricit-tenofov ala (BIKTARVY) 50-200-25 mg tablet, Take 1 tablet by mouth daily. (Patient taking differently: Take 1 tablet by mouth nightly. ), Disp: 30 tablet, Rfl: 11  ???  ibuprofen (MOTRIN) 600 MG tablet, Take 1 tablet (600 mg total) by mouth Every six (6) hours., Disp: 60 tablet, Rfl: 0  ???  ketoconazole (NIZORAL) 2 % cream, APPLY AS DIRECTED TOPICALLY DAILY, Disp: 15 g, Rfl: 2  ???  ondansetron (ZOFRAN) 4 MG tablet, TAKE ONE TABLET BY MOUTH EVERY 4 HOURS AS NEEDED FOR NAUSEA AND VOMITING, Disp: 30 tablet, Rfl: 0  ???  prenatal vit no.124-iron-folic (PRENATAL VITAMIN) 27 mg iron- 800 mcg Tab, Take 1 tablet by mouth., Disp: , Rfl:   ???  promethazine (PHENERGAN) 25 MG tablet, Take 1 tablet (25 mg total) by mouth every six (6) hours as needed for nausea (migraine headache)., Disp: 30 tablet, Rfl: 5  ???  SUMAtriptan (IMITREX) 50 MG tablet, Take 1 tablet (50 mg total) by mouth every two (2) hours as needed for migraine (Maximum 2 doses/day & 4 doses/week)., Disp: 16 tablet, Rfl: 2     Sex with {Blank single:19197::men and women,women,men}. {Blank single:19197::Not in relationship,Monogamous with single partner,Open relationship,Single, new casual partner(s), n=***}. She {does/does not:200015} disclose status. {Blank single:19197::Never,Sometimes,Always} uses condoms.  Morgan Hebert  reports that she has quit smoking. Her smoking use included cigarettes. She has never used smokeless tobacco. She reports previous alcohol use. She reports previous drug use. Drug: Marijuana.     Exercise:   Nutrition:     The past medical history, surgical history, family history, social history, medications and allergies were reviewed in Epic.     Review of Systems    The balance of 10  systems was reviewed and unremarkable except as stated above.        Objective:   PHYSICAL EXAMINATION:  There were no vitals taken for this visit.  General: Well appearing, no acute distress  HEENT: Masked.  No cervical LAD, No thyromegaly or nodules.  Chest: no chest wall deformities  Pulm: clear to auscultation  CV: RRR, no murmurs, gallops or rubs  Abd: S, NT, ND, No HSM.  Ext: no edema  Skin: No rashes  Neuro: Normal gait; strength normal, grossly  Psych: nl mood and affect  MSK:***    ***    Records Review  Wt Readings from Last 3 Encounters:   05/15/19 76.7 kg (169 lb)   04/18/19 77.1 kg (170 lb)   03/29/19 80.7 kg (178 lb)     Last   Lab Results   Component Value Date    ACD4 1,118 10/22/2018      Lab Results   Component Value Date    HIVRS Detected (A) 02/25/2019 Lab Results   Component Value Date    CREATININE 0.69 03/29/2019    CHOL 140 03/28/2018    HDL 42 03/28/2018    LDL 78 03/28/2018    NONHDL 98 03/28/2018    TRIG 99 03/28/2018    A1C 5.2 10/22/2018    PAP Negative for intraephithelial lesion or malignancy 03/29/2017      Health Maintenance Due   Topic Date Due   ??? Influenza Vaccine (1) 06/11/2019      The ASCVD Risk score Denman George DC Montez Hageman, et al., 2013) failed to calculate.   Medication adherence and barriers to the treatment plan have been addressed. Opportunities to optimize healthy behaviors have been discussed. Patient / caregiver voiced understanding.

## 2019-10-25 DIAGNOSIS — R21 Rash and other nonspecific skin eruption: Principal | ICD-10-CM

## 2019-10-25 DIAGNOSIS — B20 Human immunodeficiency virus [HIV] disease: Principal | ICD-10-CM

## 2019-10-28 DIAGNOSIS — G932 Benign intracranial hypertension: Principal | ICD-10-CM

## 2019-10-28 DIAGNOSIS — G4440 Drug-induced headache, not elsewhere classified, not intractable: Principal | ICD-10-CM

## 2019-10-28 MED ORDER — ZONISAMIDE 50 MG CAPSULE
ORAL_CAPSULE | 3 refills | 0 days | Status: CP
Start: 2019-10-28 — End: ?

## 2019-10-28 MED ORDER — PREDNISONE 10 MG TABLET
ORAL_TABLET | 0 refills | 0 days | Status: CP
Start: 2019-10-28 — End: ?

## 2019-10-29 NOTE — Unmapped (Signed)
Patient endorses worsening daily headaches that she characterizes as migraines. Also notes daily use of Imitrex, promethazine, and ibuprofen. Discussed reducing use of Imitrex to safe frequencies (no more than 2x/day and 4x/week) and component of medication overuse headache. Prescribed short bridging course of prednisone 60mg  (tapering by 10mg  every day) and restarted former Zonisamide at 50mg  every day, with plan to increase to 100mg  every day in 2 weeks if well-tolerated.    Mindi Curling, MBBS  PGY-3 Neurology  [facility] Department of Neurology

## 2019-11-01 DIAGNOSIS — B20 Human immunodeficiency virus [HIV] disease: Principal | ICD-10-CM

## 2019-11-02 NOTE — Unmapped (Signed)
[facility] Clinical Assessment & Refill Coordination Note    Morgan Hebert, DOB: 14-Mar-1986  Phone: [phone] (home) [phone] (work)    All above HIPAA information was verified with patient.     Was a Nurse, learning disability used for this call? No    Specialty Medication(s):   Infectious Disease: Biktarvy     Current Outpatient Medications   Medication Sig Dispense Refill   ??? acetaminophen (TYLENOL) 325 MG tablet Take 2 tablets (650 mg total) by mouth Every six (6) hours. 120 tablet 0   ??? acetaZOLAMIDE (DIAMOX) 500 mg capsule Take 3 capsules (1,500 mg total) by mouth Two (2) times a day. 180 capsule 5   ??? bictegrav-emtricit-tenofov ala (BIKTARVY) 50-200-25 mg tablet Take 1 tablet by mouth daily. (Patient taking differently: Take 1 tablet by mouth nightly. ) 30 tablet 11   ??? ibuprofen (MOTRIN) 600 MG tablet Take 1 tablet (600 mg total) by mouth Every six (6) hours. 60 tablet 0   ??? ketoconazole (NIZORAL) 2 % cream APPLY AS DIRECTED TOPICALLY DAILY 15 g 2   ??? ondansetron (ZOFRAN) 4 MG tablet TAKE ONE TABLET BY MOUTH EVERY 4 HOURS AS NEEDED FOR NAUSEA AND VOMITING 30 tablet 0   ??? predniSONE (DELTASONE) 10 MG tablet Take in AM: 60mg  (6 tablets) on day 1, 50mg  on day 2, 40mg  on day 3, 30mg  on day 4, 20mg  on day 5, 10mg  on day 6, then STOP. 21 tablet 0   ??? prenatal vit no.124-iron-folic (PRENATAL VITAMIN) 27 mg iron- 800 mcg Tab Take 1 tablet by mouth.     ??? promethazine (PHENERGAN) 25 MG tablet Take 1 tablet (25 mg total) by mouth every six (6) hours as needed for nausea (migraine headache). 30 tablet 5   ??? SUMAtriptan (IMITREX) 50 MG tablet Take 1 tablet (50 mg total) by mouth every two (2) hours as needed for migraine (Maximum 2 doses/day & 4 doses/week). 16 tablet 2   ??? zonisamide (ZONEGRAN) 50 MG capsule Start at 50mg  daily; if well-tolerated after 2 weeks, increase to 100mg  daily 90 capsule 3     No current facility-administered medications for this visit. Changes to medications: Adrinne reports starting the following medications: prednisone for severe migraine    Allergies   Allergen Reactions   ??? 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       Changes to allergies: No    SPECIALTY MEDICATION ADHERENCE     Biktarvy   : 11 days of medicine on hand       Medication Adherence    Patient reported X missed doses in the last month: 0  Specialty Medication: Biktarvy  Patient is on additional specialty medications: No  Any gaps in refill history greater than 2 weeks in the last 3 months: no  Demonstrates understanding of importance of adherence: yes  Informant: patient  Provider-estimated medication adherence level: good  Patient is at risk for Non-Adherence: No          Specialty medication(s) dose(s) confirmed: Regimen is correct and unchanged.     Are there any concerns with adherence? No    Adherence counseling provided? Not needed    CLINICAL MANAGEMENT AND INTERVENTION      Clinical Benefit Assessment:    Do you feel the medicine is effective or helping your condition? Yes    HIV ASSOCIATED LABS:     Lab Results   Component Value  Date/Time    HIVRS Detected (A) 02/25/2019 10:12 AM    HIVRS Not Detected 09/26/2018 11:07 AM    HIVRS Detected (A) 07/04/2018 02:06 PM    HIVRS 42,400 10/23/2017    HIVRS not detected 03/29/2017    HIVRS detected 10/25/2016    HIVCP <40 (H) 02/25/2019 10:12 AM    HIVCP <40 (H) 07/04/2018 02:06 PM    HIVCP detected 10/23/2017    HIVCP <40 (H) 05/03/2017 02:29 PM    HIVCP <20 03/29/2017    HIVCP <20 10/25/2016    RCD4 54.4 05/01/2015    RCD4 53.7 10/24/2014    ACD4 1,118 10/22/2018 09:45 AM    ACD4 1,696 (A) 05/01/2015    ACD4 967 10/24/2014    ACD4 854 06/23/2014 12:26 PM       Clinical Benefit counseling provided? Labs from 02/25/2019 show evidence of clinical benefit    Adverse Effects Assessment:    Are you experiencing any side effects? No Are you experiencing difficulty administering your medicine? No    Quality of Life Assessment:    How many days over the past month did your HIV  keep you from your normal activities? For example, brushing your teeth or getting up in the morning. 0    Have you discussed this with your provider? Not needed    Therapy Appropriateness:    Is therapy appropriate? Yes, therapy is appropriate and should be continued    DISEASE/MEDICATION-SPECIFIC INFORMATION      N/A    PATIENT SPECIFIC NEEDS     ? Does the patient have any physical, cognitive, or cultural barriers? No    ? Is the patient high risk? No     ? Does the patient require a Care Management Plan? No     ? Does the patient require physician intervention or other additional services (i.e. nutrition, smoking cessation, social work)? No      SHIPPING     Specialty Medication(s) to be Shipped:   Infectious Disease: Biktarvy    Other medication(s) to be shipped: n/a     Changes to insurance: No    Delivery Scheduled: Yes, Expected medication delivery date: 11/07/2019.  However, Rx request for refills was sent to the provider as there are none remaining.     Medication will be delivered via Same Day Courier to the confirmed prescription address in [facility].    The patient will receive a drug information handout for each medication shipped and additional FDA Medication Guides as required.  Verified that patient has previously received a Conservation officer, historic buildings.    All of the patient's questions and concerns have been addressed.    Roderic Palau   [facility] Specialty Pharmacist

## 2019-11-03 MED ORDER — BIKTARVY 50 MG-200 MG-25 MG TABLET
ORAL_TABLET | Freq: Every day | ORAL | 11 refills | 30.00000 days | Status: CP
Start: 2019-11-03 — End: 2020-11-02
  Filled 2019-11-07: qty 30, 30d supply, fill #0

## 2019-11-07 MED FILL — BIKTARVY 50 MG-200 MG-25 MG TABLET: 30 days supply | Qty: 30 | Fill #0 | Status: AC

## 2019-11-13 NOTE — Unmapped (Deleted)
Internal Medicine Initial Visit        Assessment/Plan:       There are no diagnoses linked to this encounter.     Cardiovascular  # The ASCVD Risk score Denman George DC Jorge Ny al., 2013) failed to calculate.    Health Maintenance  Health Maintenance   Topic Date Due   ??? Influenza Vaccine (1) 06/11/2019   ??? Pap Smear (21-65)  05/08/2020   ??? DTaP/Tdap/Td Vaccines (2 - Td) 02/04/2029   ??? Hepatitis C Screen  Completed          No follow-ups on file.  Next Visit:   ???     Chief Complaint:      Morgan Hebert is a 34 y.o. female who presents to Establish Care for ***      Subjective:     HPI  Pt has obesity, pseudotumor cerebri on background of HIV.   HIV: Followed by Dr [name], on [place]. VL< 40 in 02/2019  Pap?   Flu Vaccine    Patient Active Problem List   Diagnosis   ??? Pseudotumor cerebri   ??? HIV   ??? Lymphadenopathy   ??? Supervision of high risk pregnancy, unspecified, unspecified trimester   ??? Encounter for procreative genetic counseling   ??? Rubella non-immune status, antepartum   ??? Threatened premature labor in third trimester   ??? GDM- diet controlled         Current Outpatient Medications:   ???  acetaminophen (TYLENOL) 325 MG tablet, Take 2 tablets (650 mg total) by mouth Every six (6) hours., Disp: 120 tablet, Rfl: 0  ???  acetaZOLAMIDE (DIAMOX) 500 mg capsule, Take 3 capsules (1,500 mg total) by mouth Two (2) times a day., Disp: 180 capsule, Rfl: 5  ???  bictegrav-emtricit-tenofov ala (BIKTARVY) 50-200-25 mg tablet, Take 1 tablet by mouth daily., Disp: 30 tablet, Rfl: 11  ???  ibuprofen (MOTRIN) 600 MG tablet, Take 1 tablet (600 mg total) by mouth Every six (6) hours., Disp: 60 tablet, Rfl: 0  ???  ketoconazole (NIZORAL) 2 % cream, APPLY AS DIRECTED TOPICALLY DAILY, Disp: 15 g, Rfl: 2  ???  ondansetron (ZOFRAN) 4 MG tablet, TAKE ONE TABLET BY MOUTH EVERY 4 HOURS AS NEEDED FOR NAUSEA AND VOMITING, Disp: 30 tablet, Rfl: 0  ???  predniSONE (DELTASONE) 10 MG tablet, Take in AM: 60mg  (6 tablets) on day 1, 50mg  on day 2, 40mg  on day 3, 30mg  on day 4, 20mg  on day 5, 10mg  on day 6, then STOP., Disp: 21 tablet, Rfl: 0  ???  prenatal vit no.124-iron-folic (PRENATAL VITAMIN) 27 mg iron- 800 mcg Tab, Take 1 tablet by mouth., Disp: , Rfl:   ???  promethazine (PHENERGAN) 25 MG tablet, Take 1 tablet (25 mg total) by mouth every six (6) hours as needed for nausea (migraine headache)., Disp: 30 tablet, Rfl: 5  ???  SUMAtriptan (IMITREX) 50 MG tablet, Take 1 tablet (50 mg total) by mouth every two (2) hours as needed for migraine (Maximum 2 doses/day & 4 doses/week)., Disp: 16 tablet, Rfl: 2  ???  zonisamide (ZONEGRAN) 50 MG capsule, Start at 50mg  daily; if well-tolerated after 2 weeks, increase to 100mg  daily, Disp: 90 capsule, Rfl: 3     Sex with {Blank single:19197::men and women,women,men}. {Blank single:19197::Not in relationship,Monogamous with single partner,Open relationship,Single, new casual partner(s), n=***}. She {does/does not:200015} disclose status. {Blank single:19197::Never,Sometimes,Always} uses condoms.  Morgan Hebert  reports that she has quit smoking. Her smoking use included cigarettes. She has  never used smokeless tobacco. She reports previous alcohol use. She reports previous drug use. Drug: Marijuana.     Exercise:   Nutrition:     The past medical history, surgical history, family history, social history, medications and allergies were reviewed in Epic.     Review of Systems    The balance of 10 systems was reviewed and unremarkable except as stated above.        Objective:   PHYSICAL EXAMINATION:  There were no vitals taken for this visit.  General: Well appearing, no acute distress  HEENT: Masked.  No cervical LAD, No thyromegaly or nodules.  Chest: no chest wall deformities  Pulm: clear to auscultation  CV: RRR, no murmurs, gallops or rubs  Abd: S, NT, ND, No HSM.  Ext: no edema  Skin: No rashes  Neuro: Normal gait; strength normal, grossly  Psych: nl mood and affect  MSK:***    ***    Records Review  Wt Readings from Last 3 Encounters:   05/15/19 76.7 kg (169 lb)   04/18/19 77.1 kg (170 lb)   03/29/19 80.7 kg (178 lb)     Last   Lab Results   Component Value Date    ACD4 1,118 10/22/2018      Lab Results   Component Value Date    HIVRS Detected (A) 02/25/2019     Lab Results   Component Value Date    CREATININE 0.69 03/29/2019    CHOL 140 03/28/2018    HDL 42 03/28/2018    LDL 78 03/28/2018    NONHDL 98 03/28/2018    TRIG 99 03/28/2018    A1C 5.2 10/22/2018    PAP Negative for intraephithelial lesion or malignancy 03/29/2017      Health Maintenance Due   Topic Date Due   ??? Influenza Vaccine (1) 06/11/2019      The ASCVD Risk score Denman George DC Montez Hageman, et al., 2013) failed to calculate.   Medication adherence and barriers to the treatment plan have been addressed. Opportunities to optimize healthy behaviors have been discussed. Patient / caregiver voiced understanding.    I personally spent *** minutes face-to-face and non-face-to-face in the care of this patient, which includes all pre, intra, and post visit time on the date of service.

## 2019-12-02 NOTE — Unmapped (Signed)
Morgan Hebert is a lovely 34 y.o. female here for a CCS Study Visit (IRB [phone]) on 12/02/2019. HIV positive participant. Denies complaints today. Pt is engaged in care at [facility], takes [employer]  for ARVs.     Significant Medical History: pt had a baby 8 months ago at 36 weeks. Baby girl doing well. Had a BTL postpartum.      Negative COVID-19 Screen: Wellness screening and temperature check conducted at clinic door. Participant reported no COVID-19 symptoms, no contact to a known or suspected COVID-19 case in the last 14 days, and temperature was within normal limits. Participant was cleared to enter the facility.     Study visit included:    Pregnancy Test: negative    Physical Exam:  Blood Pressure, Height/Weight, In-Body Analysis  and BIA Body Measurements      Gyn Exam: Deferred, pt on day 2 of menstrual cycle, blood will obscure samples. Will need pap smear at next study visit.     GYN specimens collected: none    Labs Collected: CMP, CBC w/diff, CD4, HIV Viral Load, fasting glucose, HgA1C, RPR fasting lipids. Limited CPT tubes due to hard stick.    All labs results will be documented in Epic.

## 2019-12-03 LAB — T-LYMPHOCYTE HELPER/SUPPRESSOR
ABSOLUTE CD 3: 2145 /uL (ref 622–2402)
ABSOLUTE CD8 CNT: 689 /uL (ref 109–897)
BANDED NEUTROPHILS ABSOLUTE COUNT: 0 10*3/uL (ref 0.0–0.1)
BASOPHILS ABSOLUTE COUNT: 0.1 10*3/uL (ref 0.0–0.2)
BASOPHILS RELATIVE PERCENT: 1 %
CD4 % HELPER T CELL: 55.5 % (ref 30.8–58.5)
CD4 T CELL ABSOLUTE: 1443 /uL (ref 359–1519)
CD4:CD8 RATIO: 2.09 (ref 0.92–3.72)
CD8 % SUPPRESSOR T CELL: 26.5 % (ref 12.0–35.5)
EOSINOPHILS ABSOLUTE COUNT: 0.3 10*3/uL (ref 0.0–0.4)
EOSINOPHILS RELATIVE PERCENT: 5 %
HEMATOCRIT: 40.1 % (ref 34.0–46.6)
HEMOGLOBIN: 13.4 g/dL (ref 11.1–15.9)
IMMATURE GRANULOCYTES: 0 %
LYMPHOCYTES ABSOLUTE COUNT: 2.6 10*3/uL (ref 0.7–3.1)
LYMPHOCYTES RELATIVE PERCENT: 38 %
MEAN CORPUSCULAR HEMOGLOBIN CONC: 33.4 g/dL (ref 31.5–35.7)
MEAN CORPUSCULAR HEMOGLOBIN: 28.8 pg (ref 26.6–33.0)
MEAN CORPUSCULAR VOLUME: 86 fL (ref 79–97)
MONOCYTES ABSOLUTE COUNT: 0.5 10*3/uL (ref 0.1–0.9)
MONOCYTES RELATIVE PERCENT: 7 %
NEUTROPHILS ABSOLUTE COUNT: 3.5 10*3/uL (ref 1.4–7.0)
NEUTROPHILS RELATIVE PERCENT: 49 %
PLATELET COUNT: 314 10*3/uL (ref 150–450)
RED BLOOD CELL COUNT: 4.66 x10E6/uL (ref 3.77–5.28)
RED CELL DISTRIBUTION WIDTH: 14.6 % (ref 11.7–15.4)
WHITE BLOOD CELL COUNT: 7 10*3/uL (ref 3.4–10.8)

## 2019-12-03 LAB — ABSOLUTE CD8 CNT: Cells.CD3+CD8+:NCnc:Pt:Bld:Qn:: 689

## 2019-12-03 LAB — ALB+ALP+ALT+AST+BUN+CA+CREA...
ALBUMIN: 4.7 g/dL (ref 3.8–4.8)
ALKALINE PHOSPHATASE: 95 IU/L (ref 39–117)
ALT (SGPT): 13 IU/L (ref 0–32)
CALCIUM: 9.7 mg/dL (ref 8.7–10.2)
CREATININE: 0.98 mg/dL (ref 0.57–1.00)
GAMMA GLUTAMYL TRANSFERASE: 16 IU/L (ref 0–60)
GFR MDRD AF AMER: 88 mL/min/{1.73_m2}
GFR MDRD NON AF AMER: 76 mL/min/{1.73_m2}
GLUCOSE: 75 mg/dL (ref 65–99)
PHOSPHORUS, SERUM: 4 mg/dL (ref 3.0–4.3)

## 2019-12-03 LAB — LIPID PANELX (LABCORP ONLY)
CHOLESTEROL, TOTAL: 165 mg/dL (ref 100–199)
HDL CHOLESTEROL: 48 mg/dL
TRIGLYCERIDES: 109 mg/dL (ref 0–149)
VLDL CHOLESTEROL CAL: 20 mg/dL (ref 5–40)

## 2019-12-03 LAB — HDL CHOLESTEROL: Cholesterol.in HDL:MCnc:Pt:Ser/Plas:Qn:: 48

## 2019-12-03 LAB — HEMOGLOBIN A1C: Hemoglobin A1c/Hemoglobin.total:MFr:Pt:Bld:Qn:: 5.5

## 2019-12-03 LAB — GLUCOSE: Glucose:MCnc:Pt:Ser/Plas:Qn:: 75

## 2019-12-03 LAB — RPR: Reagin Ab:PrThr:Pt:Ser:Ord:RPR: NONREACTIVE

## 2019-12-04 LAB — HIV-1, VIRAL LOAD DETERMINED BY PCR (87536): HIV-1 RNA BY PCR: <20 {copies}/mL

## 2019-12-04 LAB — HIV-1 RNA BY PCR: HIV 1 RNA:NCnc:Pt:Ser/Plas:Qn:Probe.amp.tar: <20

## 2019-12-05 NOTE — Unmapped (Signed)
Study labs received and reviewed, all within range:  HIV VL <20 copies/mL; CD4/CMP/CBC/Lipids/HA1c all within range.   RPR non reactive.     Results available in Epic.

## 2019-12-05 NOTE — Unmapped (Signed)
[facility] Refill Coordination Note    Specialty Medication(s) to be Shipped:   Infectious Disease: Biktarvy    Other medication(s) to be shipped: n/a     Morgan Hebert, DOB: 10-Feb-1986  Phone: [phone] (home) [phone] (work)      All above HIPAA information was verified with patient.     Was a Nurse, learning disability used for this call? No    Completed refill call assessment today to schedule patient's medication shipment from the [facility] [phone]).       Specialty medication(s) and dose(s) confirmed: Regimen is correct and unchanged.   Changes to medications: [name] reports no changes at this time.  Changes to insurance: No  Questions for the pharmacist: No    Confirmed patient received Welcome Packet with first shipment. The patient will receive a drug information handout for each medication shipped and additional FDA Medication Guides as required.       DISEASE/MEDICATION-SPECIFIC INFORMATION        N/A    SPECIALTY MEDICATION ADHERENCE     Medication Adherence    Patient reported X missed doses in the last month: 0  Specialty Medication: biktarvy            Unable to confirm quantity on hand.       SHIPPING     Shipping address confirmed in Epic.     Delivery Scheduled: Yes, Expected medication delivery date: 3/2.     Medication will be delivered via Next Day Courier to the prescription address in Epic W[facility].    [name]   [facility] Specialty Technician

## 2019-12-10 MED FILL — BIKTARVY 50 MG-200 MG-25 MG TABLET: ORAL | 30 days supply | Qty: 30 | Fill #1

## 2019-12-10 MED FILL — BIKTARVY 50 MG-200 MG-25 MG TABLET: 30 days supply | Qty: 30 | Fill #1 | Status: AC

## 2019-12-24 NOTE — Unmapped (Signed)
Assessment/Plan:      Morgan Hebert, a 34 y.o. female called today for routine HIV followup.    Plan:  HIV  Fills ART via private insurance. RW renewal needed for this period.  ?? Continue current therapy. E-prescribed at recent visit, no refills needed. She uses [facility].  ?? No labs today    ?? Encouraged continued excellent ARV adherence.   Lab Results   Component Value Date    ACD4 1,118 10/22/2018    CD4 54 10/22/2018    HIVCP <40 (H) 02/25/2019    HIVRS Detected (A) 02/25/2019   She is interested in establishing primary care with Dr. Marguerita Beards now that she had had her daughter but was unable to make the appointment. Re-referred today.     Mental Health  ?? Provided support surrounding separation with her husband.   ?? Overall doing well and feels safe.     Lower extremity edema  ?? Not present on exam today  ?? Discussed using support hose, lowering salt in her diet  ?? I am reluctant to try a diuretic given therapy for her pseudotumor. She will monitor.    Pseudotumor cerebri  ?? Working with neurologists, symptoms are stable.  ?? For ophtho followup.    Sexual health & secondary prevention  Sex with men. Monogamous with single partner. She does disclose status. Never uses condoms.    Lab Results   Component Value Date    RPR Non Reactive 12/02/2019    CTNAA Negative 03/28/2019    CTNAA Negative 10/11/2018    CTNAA Negative 09/26/2018    GCNAA Negative 03/28/2019    GCNAA Negative 10/11/2018    GCNAA Negative 09/26/2018    SPECTYPE Swab 03/28/2019    SPECTYPE Swab 10/11/2018    SPECTYPE Urine 09/26/2018    SPECSOURCE Endocervix 03/28/2019    SPECSOURCE Endocervix 10/11/2018    SPECSOURCE Urine 09/26/2018     ?? GC/CT NAATs -- negative from cervical swab 03/2019   ?? RPR -- NR 03/2019 - repeat 1Y      Health maintenance  Lab Results   Component Value Date    CREATININE 0.98 12/02/2019    QFTTBGOLD NEGATIVE 02/13/2014    HEPCAB Nonreactive 10/22/2018    CHOL 165 12/02/2019    HDL 48 12/02/2019    LDL 97 12/02/2019 NONHDL 98 03/28/2018    TRIG 295 12/02/2019    A1C 5.5 12/02/2019    PAP Negative for intraephithelial lesion or malignancy 03/29/2017    FINALDX  03/29/2019     A: Placenta, delivery    - Preterm (36 weeks 1 days EGA) singleton third trimester placenta, weight 320 g (weight 10th percentile for EGA)   - No acute chorioamnionitis identified   - Three vessel umbilical cord with narrow diameter (0.9 cm) and a furcate insertion  - Placental plate: accelerated maturation of chorionic villi,  increased intervillous and subchorionic fibrin, focal fetal thrombotic vasculopathy, small placental infarction (1.2 cm)        This electronic signature is attestation that the pathologist personally reviewed the submitted material(s) and the final diagnosis reflects that evaluation.       Communicable diseases  # TB - no longer needed; negative IGRA 2015 and low/no risk  # HCV - negative 10/2018; rescreen w/Ab q1-2y    Cancer screening  # Anorectal - not yet done  # Colorectal - SCREEN AGE 3+  # Liver - not yet done  # Lung - not applicable    #  Breast - SCREEN AGE 34+ -- Q1-2Y  # Cervical - neg cyto June 2018 through [facility] Overdue for repeat June 2020.    Cardiovascular disease  # The ASCVD Risk score Denman George DC Jorge Ny al., 2013) failed to calculate.    Immunization History   Administered Date(s) Administered   ??? Influenza Vaccine Quad (IIV4 PF) 56mo+ injectable 06/30/2015, 06/15/2016, 07/04/2018   ??? MMR 03/31/2019   ??? PNEUMOCOCCAL POLYSACCHARIDE 23 03/31/2019   ??? TdaP 02/05/2019     ?? Screening ordered today: none  ?? Immunizations ordered today: none - declined current flu shot. We discussed importance of accessing COVID-19 vaccine when available to her.     I spent 25 minutes with the patient. I spent an additional 10 minutes on pre- and post-visit activities.   Counseled as documented above regarding medication adherence, weight management and healthy lifestyle choices and need for recommended screening tests.    Disposition Return to clinic 3-4 months or sooner if needed.    [name], MD, MPH    [facility]   Phone: [phone]   Fax: [phone]      Subjective:      Chief Complaint   HIV followup    HPI  Return patient visit for Morgan Hebert, a 34 y.o. woman with now well-controlled HIV. She is in process of divorce from her husband. Baby is doing well, as is her older daughter.   Notes some foot swelling at night. States lowering salt intake did not help. Attributes to weight gain with pregnancy. Not present right now.    Past Medical History:   Diagnosis Date   ??? Abnormal mammogram    ??? Constipation    ??? Diarrhea    ??? Environmental allergies    ??? GDM- diet controlled 03/07/2019   ??? HIV (human immunodeficiency virus infection) (CMS-HCC)    ??? HIV (human immunodeficiency virus infection) (CMS-HCC)    ??? IUD (intrauterine device) in place    ??? Migraine    ??? Obesity    ??? Pseudotumor cerebri    ??? STD (sexually transmitted disease)      Medications and Allergies   Reviewed and updated today. See bottom of this visit's encounter summary for details.  Current Outpatient Medications on File Prior to Visit   Medication Sig   ??? acetaminophen (TYLENOL) 325 MG tablet Take 2 tablets (650 mg total) by mouth Every six (6) hours.   ??? [EXPIRED] acetaZOLAMIDE (DIAMOX) 500 mg capsule Take 3 capsules (1,500 mg total) by mouth Two (2) times a day.   ??? bictegrav-emtricit-tenofov ala (BIKTARVY) 50-200-25 mg tablet Take 1 tablet by mouth daily.   ??? ibuprofen (MOTRIN) 600 MG tablet Take 1 tablet (600 mg total) by mouth Every six (6) hours.   ??? ketoconazole (NIZORAL) 2 % cream APPLY AS DIRECTED TOPICALLY DAILY   ??? ondansetron (ZOFRAN) 4 MG tablet TAKE ONE TABLET BY MOUTH EVERY 4 HOURS AS NEEDED FOR NAUSEA AND VOMITING   ??? predniSONE (DELTASONE) 10 MG tablet Take in AM: 60mg  (6 tablets) on day 1, 50mg  on day 2, 40mg  on day 3, 30mg  on day 4, 20mg  on day 5, 10mg  on day 6, then STOP.   ??? prenatal vit no.124-iron-folic (PRENATAL VITAMIN) 27 mg iron- 800 mcg Tab Take 1 tablet by mouth.   ??? promethazine (PHENERGAN) 25 MG tablet Take 1 tablet (25 mg total) by mouth every six (6) hours as needed for nausea (migraine headache).   ??? SUMAtriptan (IMITREX) 50 MG  tablet Take 1 tablet (50 mg total) by mouth every two (2) hours as needed for migraine (Maximum 2 doses/day & 4 doses/week).   ??? zonisamide (ZONEGRAN) 50 MG capsule Start at 50mg  daily; if well-tolerated after 2 weeks, increase to 100mg  daily     No current facility-administered medications on file prior to visit.      Allergies   Allergen Reactions   ??? 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     Social History  General ??? lives in [place] with her husband and her 2 daughters (born 2008, 2020). There is financial tension, they are separating.   ?? Doreene Adas and his girlfriend are there sometimes as well.   ?? Mom and sister are both positive (sister is perinatally infected, patient with presumed sexual transmission from female partner).  ?? 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 Loves vampire shows like True Blood.   Sexual History ??? sex with men   Substance Use ??? marijuana (smokes nightly)  Social History     Tobacco Use   ??? Smoking status: Former Smoker     Types: Cigarettes   ??? Smokeless tobacco: Never Used   Substance Use Topics   ??? Alcohol use: Not Currently     Alcohol/week: 0.0 standard drinks     Comment: rare     Review of Systems  As per HPI. Remainder of 10 systems reviewed, negative.      Objective:      BP 122/89  - Pulse 100  - Temp 36.2 ??C (97.2 ??F)  - Ht 149.9 cm (4' 11)  - Wt 82 kg (180 lb 11.2 oz)  - BMI 36.50 kg/m??   Wt Readings from Last 3 Encounters:   05/15/19 76.7 kg (169 lb)   04/18/19 77.1 kg (170 lb)   03/29/19 80.7 kg (178 lb)     Const looks well and attentive, alert, appropriate   Eyes sclerae anicteric, noninjected OU   ENT deferred due to need for masking   Lymph no cervical or supraclavicular LAD   CV RRR. No murmurs. No rub or gallop. S1/S2.   Lungs CTAB ant/post, normal work of breathing   GI Soft, no organomegaly. NTND. NABS.   GU deferred   Rectal deferred   Skin no petechiae, ecchymoses or obvious rashes on clothed exam   MSK no joint tenderness and normal ROM throughout. No LE edema, feet warm with brisk DP/PT pulses.   Neuro CN II-XII grossly intact, MAEE, non focal   Psych Appropriate affect. Eye contact good. Linear thoughts. Fluent speech.     Laboratory Data  Reviewed in Epic today, using Synopsis and Chart Review filters.    Lab Results   Component Value Date    CREATININE 0.98 12/02/2019    QFTTBGOLD NEGATIVE 02/13/2014    HEPCAB Nonreactive 10/22/2018    CHOL 165 12/02/2019    HDL 48 12/02/2019    LDL 97 12/02/2019    NONHDL 98 03/28/2018    TRIG 161 12/02/2019    A1C 5.5 12/02/2019    PAP Negative for intraephithelial lesion or malignancy 03/29/2017    FINALDX  03/29/2019     A: Placenta, delivery    - Preterm (36 weeks 1 days EGA) singleton third trimester placenta, weight 320 g (weight 10th percentile for EGA)   - No acute chorioamnionitis identified   - Three vessel umbilical  cord with narrow diameter (0.9 cm) and a furcate insertion  - Placental plate: accelerated maturation of chorionic villi,  increased intervillous and subchorionic fibrin, focal fetal thrombotic vasculopathy, small placental infarction (1.2 cm)        This electronic signature is attestation that the pathologist personally reviewed the submitted material(s) and the final diagnosis reflects that evaluation.              _____________________________________________________________________

## 2019-12-25 ENCOUNTER — Encounter: Admit: 2019-12-25 | Discharge: 2019-12-26 | Payer: BLUE CROSS/BLUE SHIELD

## 2019-12-25 DIAGNOSIS — B20 Human immunodeficiency virus [HIV] disease: Principal | ICD-10-CM

## 2019-12-25 MED ORDER — KETOCONAZOLE 2 % TOPICAL CREAM
0.00000 days
Start: 2019-12-25 — End: ?

## 2019-12-25 NOTE — Unmapped (Addendum)
It was great to see you today.    Make sure to schedule your internal medicine appointment. This clinic will be in the same building as our new clinic.  [facility] INTERNAL MEDICINE [place]: [phone]    COVID vaccine resources  Video by an amazing MD at [facility], Dr. De Nurse.  WirelessImprov.[email or web address]  ??  She was also part of a great TV special about the vaccine with Okey Regal:  ShippingScam.co.uk  ??  [employer] and a variety of perspectives from [facility]:  [email or web address].http://[email or web address]/  [email or web address]    Please note:  We will be moving to a new clinic location on December 27, 2019: [address], [place], [place] [address]. This is off of [address], near [address] of [address].  We will be on the 5th floor. Parking is free, but in a separate parking deck. Check-in is downstairs. Please allow a little extra time to get to our clinic.    Contacting us   During working hours  [phone]  After hours or weekends ([phone]) [phone] and ask for the ID doctor on call  Fax number   [phone]    MEDICATIONS  For refills, please contact your pharmacy and ask them to electronically send or fax the request to the clinic.     Please bring all medications in original bottles to every appointment.    HMAP (formerly ADAP) or [employer] Eligibility (required even if you do not receive medication through [facility])  Please remember to renew your Juanell Fairly eligibility during renewal periods which occur twice a year: January-March and July-September.     The following are needed for each renewal:   - [facility] Identification (if you don't have one, then a bill with your name and address in [place])   - proof of income (award letter, W-2, or last three check stubs)   If you are unable to come in for renewal, let us know if we can mail, fax or e-mail paperwork to you.   HMAP Contact: [phone]      Urgent Care Clinic Monday, Tuesday, and Thursday from 8:30 - 12 noon  Please call ahead to speak with the nursing staff if you think you need to be seen urgently!    Lab info:  Your most recent CD4 T-cell counts and viral loads are great. Here are a few things to keep in mind when looking at your numbers:  ?? For most people, we're checking CD4 counts fairly infrequently (once a year or less)  ?? It's normal for your CD4 count to be different from visit to visit.   ?? We consider your viral load to be undetectable if it says <40 or if it says Not detected.  ?? Our goal is to get your virus to be undetectable and keep it undetectable. You can help by taking your medications at about the same time, every single day. If you're having trouble with taking your medications, it's important to let us know.    YOUR RECENT LAB RESULTS:  CD4 and VIRAL LOAD  Lab Results   Component Value Date    ACD4 1,118 10/22/2018    HIVRS Detected (A) 02/25/2019     [name], MD, MPH   [facility]   Phone: [phone]   Fax: [phone]     Leg and Ankle Edema: Care Instructions  Your Care Instructions  Swelling in the legs, ankles, and feet is called edema. It is common after you sit or  stand for a while. Long plane flights or car rides often cause swelling in the legs and feet. You may also have swelling if you have to stand for long periods of time at your job. Problems with the veins in the legs (varicose veins) and changes in hormones can also cause swelling. Sometimes the swelling in the ankles and feet is caused by a more serious problem, such as heart failure, infection, blood clots, or liver or kidney disease.  Follow-up care is a key part of your treatment and safety. Be sure to make and go to all appointments, and call your doctor if you are having problems. It's also a good idea to know your test results and keep a list of the medicines you take.  How can you care for yourself at home?  ?? If your doctor gave you medicine, take it as prescribed. Call your doctor if you think you are having a problem with your medicine.  ?? Whenever you are resting, raise your legs up. Try to keep the swollen area higher than the level of your heart.  ?? Take breaks from standing or sitting in one position.  ? Walk around to increase the blood flow in your lower legs.  ? Move your feet and ankles often while you stand, or tighten and relax your leg muscles.  ?? Wear support stockings. Put them on in the morning, before swelling gets worse.  ?? Eat a balanced diet. Lose weight if you need to.  ?? Limit the amount of salt (sodium) in your diet. Salt holds fluid in the body and may increase swelling.  When should you call for help?   Call 911 anytime you think you may need emergency care. For example, call if:  ?? ?? You have symptoms of a blood clot in your lung (called a pulmonary embolism). These may include:  ? Sudden chest pain.  ? Trouble breathing.  ? Coughing up blood.   Call your doctor now or seek immediate medical care if:  ?? ?? You have signs of a blood clot, such as:  ? Pain in your calf, back of the knee, thigh, or groin.  ? Redness and swelling in your leg or groin.   ?? ?? You have symptoms of infection, such as:  ? Increased pain, swelling, warmth, or redness.  ? Red streaks or pus.  ? A fever.   Watch closely for changes in your health, and be sure to contact your doctor if:  ?? ?? Your swelling is getting worse.   ?? ?? You have new or worsening pain in your legs.   ?? ?? You do not get better as expected.   Where can you learn more?  Go to [facility] at https://myuncchart.org  Select Patient Education under [employer]. Enter [phone] in the search box to learn more about Leg and Ankle Edema: Care Instructions.  Current as of: December 05, 2018??????????????????????????????Content Version: 12.8  ?? 2006-2021 Healthwise, Incorporated.   Care instructions adapted under license by [facility]. If you have questions about a medical condition or this instruction, always ask your healthcare professional. Healthwise, Incorporated disclaims any warranty or liability for your use of this information.

## 2019-12-26 NOTE — Unmapped (Signed)
PROMIS Tablet Screening  Completed Date: 12/25/2019     SW reviewed self-administered screening.    Patient had a PHQ-9 score of 1  indicating None-Minimal depression.   Pt denies SI.   Pt scored 3 on AUDIT/AUDIT-C indicating At-risk drinking   Pt  endorses substance use in past 3 months. (Marijuana)  Pt denies concerns for IPV.    Pt seen for regular ID visit today.      Lilli Few, CHES  ID Clinic Social Work   Direct: [phone]  Main ID: [phone]

## 2020-01-03 NOTE — Unmapped (Signed)
[facility] Refill Coordination Note    Specialty Medication(s) to be Shipped:   Infectious Disease: Biktarvy    Other medication(s) to be shipped: n/a     Morgan Hebert, DOB: 26-Jan-1986  Phone: [phone] (home) [phone] (work)      All above HIPAA information was verified with patient.     Was a Nurse, learning disability used for this call? No    Completed refill call assessment today to schedule patient's medication shipment from the [facility] [phone]).       Specialty medication(s) and dose(s) confirmed: Regimen is correct and unchanged.   Changes to medications: Fara reports no changes at this time.  Changes to insurance: No  Questions for the pharmacist: No    Confirmed patient received Welcome Packet with first shipment. The patient will receive a drug information handout for each medication shipped and additional FDA Medication Guides as required.       DISEASE/MEDICATION-SPECIFIC INFORMATION        N/A    SPECIALTY MEDICATION ADHERENCE     Medication Adherence    Patient reported X missed doses in the last month: 0  Specialty Medication: biktarvy              Unable to confirm quantity on hand.      SHIPPING     Shipping address confirmed in Epic.     Delivery Scheduled: Yes, Expected medication delivery date: 4/1.     Medication will be delivered via Next Day Courier to the prescription address in Epic WAM.    [name]   [facility] Specialty Technician

## 2020-01-08 MED FILL — BIKTARVY 50 MG-200 MG-25 MG TABLET: 30 days supply | Qty: 30 | Fill #2 | Status: AC

## 2020-01-08 MED FILL — BIKTARVY 50 MG-200 MG-25 MG TABLET: ORAL | 30 days supply | Qty: 30 | Fill #2

## 2020-01-14 NOTE — Unmapped (Signed)
1st attempt- Tried pt at [phone].  Pt's vm is full.  Pt has a referral to establish care with Dr King./tsp

## 2020-01-24 MED ORDER — ACETAZOLAMIDE ER 500 MG CAPSULE,EXTENDED RELEASE
0.00000 days
Start: 2020-01-24 — End: ?

## 2020-02-05 NOTE — Unmapped (Signed)
[facility] Refill Coordination Note    Specialty Medication(s) to be Shipped:   Infectious Disease: Biktarvy    Other medication(s) to be shipped: n/a     Morgan Hebert, DOB: 05-28-1986  Phone: [phone] (home) [phone] (work)      All above HIPAA information was verified with patient.     Was a Nurse, learning disability used for this call? No    Completed refill call assessment today to schedule patient's medication shipment from the [facility] [phone]).       Specialty medication(s) and dose(s) confirmed: Regimen is correct and unchanged.   Changes to medications: Morgan Hebert reports no changes at this time.  Changes to insurance: No  Questions for the pharmacist: No    Confirmed patient received Welcome Packet with first shipment. The patient will receive a drug information handout for each medication shipped and additional FDA Medication Guides as required.       DISEASE/MEDICATION-SPECIFIC INFORMATION        N/A    SPECIALTY MEDICATION ADHERENCE     Medication Adherence    Patient reported X missed doses in the last month: 0  Specialty Medication: biktarvy                biktarvy  : 5 days of medicine on hand         SHIPPING     Shipping address confirmed in Epic.     Delivery Scheduled: Yes, Expected medication delivery date: 4/30.     Medication will be delivered via Next Day Courier to the prescription address in Epic WAM.    Morgan Hebert   [facility] Specialty Technician

## 2020-02-06 MED FILL — BIKTARVY 50 MG-200 MG-25 MG TABLET: ORAL | 30 days supply | Qty: 30 | Fill #3

## 2020-02-06 MED FILL — BIKTARVY 50 MG-200 MG-25 MG TABLET: 30 days supply | Qty: 30 | Fill #3 | Status: AC

## 2020-02-11 ENCOUNTER — Encounter: Admit: 2020-02-11 | Discharge: 2020-02-12 | Payer: BLUE CROSS/BLUE SHIELD

## 2020-02-11 NOTE — Unmapped (Signed)
Internal Medicine Initial Visit           Person Contacted: Patient  Contact Phone number: [phone] (home) [phone] (work)  Is there someone else in the room? No.     Assessment/Plan:   Morgan Hebert was seen today for establish care.    Diagnoses and all orders for this visit:    Pseudotumor cerebri  By outside records, seems to be on diamox and zonegran.   Had been managed by neurology. But I do not see a follow up appt coming up with them.   Plan: reassess at next visit and encourage her to follow up with neurology  HIV  Viral load suppressed; on Biktarvy, followed by [name]-     Ambulatory referral to Internal Medicine  -     Ambulatory referral to Internal Medicine  Ear Pain  Cannot examine, but does not seem to be otitis externa.   Likely related to allergies.   Recommended flonase and to come in if it worsens  Social situation  Single mom, 2 kids. Going to be phased out of her job in December.  Plan: give social supports as able.    Health Maintenance  Encouraged to get Covid vaccine  Next Visit:   ??? Discuss pseudotumor  Return in about 4 months (around 06/13/2020) for Annual physical.    Chief Complaint:      Morgan Hebert is a 34 y.o. female who presents to Establish Care  Subjective:     HPI    Pt has pseudotumor cerebri, obesity on background of HIV.   Concern today: right ear pain, for about a week. Has bad allergies.   No runny nose. No fever.   Pseudotumor Cerebri: Being seen by neurology but still with headaches.       Patient Active Problem List   Diagnosis   ??? Pseudotumor cerebri   ??? HIV   ??? Lymphadenopathy   ??? GDM- diet controlled         Current Outpatient Medications:   ???  acetaminophen (TYLENOL) 325 MG tablet, Take 2 tablets (650 mg total) by mouth Every six (6) hours., Disp: 120 tablet, Rfl: 0  ???  bictegrav-emtricit-tenofov ala (BIKTARVY) 50-200-25 mg tablet, Take 1 tablet by mouth daily., Disp: 30 tablet, Rfl: 11  ???  hydrocortisone 1 % ointment, , Disp: , Rfl:   ???  ibuprofen (MOTRIN) 600 MG tablet, Take 1 tablet (600 mg total) by mouth Every six (6) hours., Disp: 60 tablet, Rfl: 0  ???  ondansetron (ZOFRAN) 4 MG tablet, TAKE ONE TABLET BY MOUTH EVERY 4 HOURS AS NEEDED FOR NAUSEA AND VOMITING, Disp: 30 tablet, Rfl: 0  ???  zonisamide (ZONEGRAN) 50 MG capsule, Start at 50mg  daily; if well-tolerated after 2 weeks, increase to 100mg  daily, Disp: 90 capsule, Rfl: 3  ???  acetaZOLAMIDE (DIAMOX) 500 mg ER 12 hr capsule, , Disp: , Rfl:   ???  ketoconazole (NIZORAL) 2 % cream, , Disp: , Rfl:      Not currently in a relationship. Birth control:  s/p bilateral salpingectomy  Morgan Hebert  reports that she has quit smoking. Her smoking use included cigarettes. She has never used smokeless tobacco. She reports previous alcohol use. She reports previous drug use. Drug: Marijuana.     Exercise: walks, tries to get in her steps  Has 38 year old and 62 month old    The past medical history, surgical history, family history, social history, medications and allergies were reviewed in Epic.  Review of Systems    The balance of 10 systems was reviewed and unremarkable except as stated above.        Objective:   PHYSICAL EXAMINATION:  BP Readings from Last 3 Encounters:   12/25/19 122/89   06/04/19 149/99   04/18/19 111/86      Wt Readings from Last 3 Encounters:   12/25/19 82 kg (180 lb 11.2 oz)   05/15/19 76.7 kg (169 lb)   04/18/19 77.1 kg (170 lb)      General appearance -   Looks well  Animated  Records Review     Last   Lab Results   Component Value Date    ACD4 1,118 10/22/2018      Lab Results   Component Value Date    HIVRS Detected (A) 02/25/2019     Lab Results   Component Value Date    CREATININE 0.98 12/02/2019    CHOL 165 12/02/2019    HDL 48 12/02/2019    LDL 97 12/02/2019    NONHDL 98 03/28/2018    TRIG 161 12/02/2019    A1C 5.5 12/02/2019    PAP Negative for intraephithelial lesion or malignancy 03/29/2017      Health Maintenance Due   Topic Date Due   ??? COVID-19 Vaccine (1) Never done   ??? Pap Smear (21-65) 05/08/2020      Medication adherence and barriers to the treatment plan have been addressed. Opportunities to optimize healthy behaviors have been discussed. Patient / caregiver voiced understanding.        I spent 16 minutes on the real-time audio and video with the patient on the date of service. I spent an additional 10 minutes on pre- and post-visit activities on the date of service.     The patient was physically located in [place] or a state in which I am permitted to provide care. The patient and/or parent/guardian understood that s/he may incur co-pays and cost sharing, and agreed to the telemedicine visit. The visit was reasonable and appropriate under the circumstances given the patient's presentation at the time.    The patient and/or parent/guardian has been advised of the potential risks and limitations of this mode of treatment (including, but not limited to, the absence of in-person examination) and has agreed to be treated using telemedicine. The patient's/patient's family's questions regarding telemedicine have been answered.     If the visit was completed in an ambulatory setting, the patient and/or parent/guardian has also been advised to contact their provider???s office for worsening conditions, and seek emergency medical treatment and/or call 911 if the patient deems either necessary.

## 2020-02-11 NOTE — Unmapped (Signed)
Welcome to the [facility], a part of the [facility] of Medicine and [facility]. Thank you for choosing [place] for your primary care needs.        Provider Teams  Our clinic believes in physician-directed care teams that include a variety of caregivers.  You may receive care from physicians, nurse practioners, physician assistants, pharmacists, dieticians, counselors, and/or social workers.    Your doctor may also be completing post-graduate training at [facility] in order to become a board certified in internal medicine.  These resident physicians are organized into teams of several doctors including an attending or faculty physician.  If your doctor is not available on a particular day, another doctor will take care of you.    Office Hours  8 am-5 pm Monday through Friday    Getting Ready for your Visits:  ?? Please arrive 15 minutes prior to your appointment time.  ?? Please fill out any questionnaires that your provider has asked you to complete either on paper or My [facility] Chart.    Services:  ?? Same Day Care (8:30am-4:30pm daily-please call for an appointment)  ?? Laboratory (full service lab located on first floor and point of care labs)  ?? Enhanced Care Program (Diabetes, Hypertension, Heart Failure, Chronic Pain, Depression, Anticoagulation Monitoring)  ?? Artist (located adjacent to the pharmacy)  ?? Outpatient Pharmacy (co-located adjacent to the IM clinic)    Important Numbers  1. How do I schedule an appointment or get a message to my doctor?  Call [phone] or toll free [phone]. Press 1 for English or 2 for Spanish. Our fax number is [phone].    2. Same Day Clinic   I'm sick today and need an appointment during office hours. Who do I call?  ?? Call [phone] or toll free [phone], ask for an appointment in the Same Day Clinic  ?? Same Day Clinic is located on the 5th floor at [address], [place] [address]    3. How do I cancel or reschedule an appointment?  ?? Call [phone] or toll free [phone].   ?? You may also cancel your appointment by using [facility] (patient portal).    4. How do I request medication refills?  Request a refill via [facility] (patient portal), call clinic at [phone] or have your pharmacy fax the request to [phone].    [facility]: [phone] *Pharmacy can mail medications to your home. You must call to request the medication be mailed.[facility]: [phone]  [facility]: [phone]    5. After Hours, Weekend, or Holidays:  It's after 5:00pm during the week or it's the weekend. How do I get medical attention?    ?? Call the [facility] 24/7 Nursing Line [phone] to get nurse advice.  ?? Go to [facility] Urgent Care walk-in clinic at [address], [place], [place]; [phone]; 7 days a week from 9:00AM - 8:00PM.    6. Care Manager/Financial Counseling  I'm having trouble with my health because of cost, my mood, trouble getting to clinic, or where I live. How can I get help? Your Care Manager can help!  ??? Call Jennell Corner, LCSW(Bilingual) at [phone] or Carolyne Littles, LCSW-A at [phone] for assistance.  ??? They are available Monday-Friday 8:00AM -5:00PM  ??? [facility] Internal Medicine at [facility] Counselor: Ivor Costa [phone]  ??? [facility] Internal Medicine at [facility]  Financial Counselor(Bilingual): Mayra McCarty [phone]  ??? [facility] Assistance([place] PAP): [phone]    7. [place] [employer]  I'm feeling unsafe, have experienced physical abuse, threats, emotional abuse, sexual abuse or other violence. Who do I call for confidential advice and assistance?  ??? Call [phone] Monday through Fridays 9:00am-4:30pm. Call [phone] after hours.    8. Same Day Clinic   I'm sick today and need an appointment during office hours. Who do I call?  ?? Call [phone] or toll free [phone], ask for an appointment in the Same Day Clinic  ?? Same Day Clinic is located on the 5th floor at [address], [place] [address]      Clinic Website: [email or web address]      Our clinic is proud to be recognized as a patient-centered medical home.

## 2020-02-11 NOTE — Unmapped (Signed)
Previsit complete    Time spent on phone: 8 min    Reason for visit: Establish Care    Questions / Concerns that need to be addressed:    General Consent to Treat (GCT) for non-epic video visits only: Verbal consent    Vitals signs, if any:       Diabetes:  ??? Regularly checking blood sugars?: no  o If yes, when? Complete log  Date Before Breakfast After Breakfast Before Lunch After Lunch Before Dinner After Dinner Before Bed                                                                                                                                 Hypertension:  ??? Have blood pressure cuff at home?: yes- arm cuff  ??? Regularly checking blood pressure?: no  If yes, complete log  Date Time BP Pulse                                                                           Weight:     Date          Allergies reviewed: Yes    Medication reviewed: Yes    Refills needed, if any:     Travel Screening: completed    Falls Risk: completed    Hark (DV) Screening: completed    HCDM reviewed and updated in Epic:  Completed or Not completed (Reason complete)      HARK Screening  HARK Screening  Within the last year, have you been humiliated or emotionally abused in other ways by your partner or ex-partner?: No  Within the last year, have you been afraid of your partner or ex-partner?: No  Within the last year, have you been raped or forced to have any kind of sexual activity by your partner or ex-partner?: No  Within the last year, have you been kicked, hit, slapped, or otherwise physically hurt by your partner or ex-partner?: No    AUDIT  AUDIT - C Score (Part 1): 2    PHQ2  PHQ-2 Total Score : 0    PHQ9          P4 Suicidality Screener                GAD7       COPD Assessment

## 2020-02-19 ENCOUNTER — Encounter
Admit: 2020-02-19 | Discharge: 2020-02-20 | Disposition: A | Discharge status: Home / Self Care | Payer: BLUE CROSS/BLUE SHIELD | Attending: Emergency Medicine

## 2020-02-19 DIAGNOSIS — R112 Nausea with vomiting, unspecified: Principal | ICD-10-CM

## 2020-02-19 LAB — URINALYSIS WITH CULTURE REFLEX
BACTERIA: NONE SEEN /HPF
BILIRUBIN UA: NEGATIVE
BLOOD UA: NEGATIVE
GLUCOSE UA: NEGATIVE
LEUKOCYTE ESTERASE UA: NEGATIVE
PH UA: 6 (ref 5.0–9.0)
PROTEIN UA: NEGATIVE
RBC UA: 0 /HPF (ref <4)
SPECIFIC GRAVITY UA: 1.025 (ref 1.005–1.040)
SQUAMOUS EPITHELIAL: 5 /HPF (ref 0–5)
UROBILINOGEN UA: 0.2
WBC UA: 0 /HPF (ref 0–5)

## 2020-02-19 LAB — CBC W/ AUTO DIFF
BASOPHILS ABSOLUTE COUNT: 0 10*9/L (ref 0.0–0.1)
BASOPHILS RELATIVE PERCENT: 0.1 %
EOSINOPHILS ABSOLUTE COUNT: 0.2 10*9/L (ref 0.0–0.7)
EOSINOPHILS RELATIVE PERCENT: 1.1 %
HEMATOCRIT: 43.3 % (ref 35.0–44.0)
HEMOGLOBIN: 14.6 g/dL (ref 12.0–15.5)
LYMPHOCYTES ABSOLUTE COUNT: 1.1 10*9/L (ref 0.7–4.0)
LYMPHOCYTES RELATIVE PERCENT: 7.6 %
MEAN CORPUSCULAR HEMOGLOBIN: 29.3 pg (ref 26.0–34.0)
MEAN CORPUSCULAR VOLUME: 86.9 fL (ref 82.0–98.0)
MEAN PLATELET VOLUME: 8.9 fL (ref 7.0–10.0)
MONOCYTES ABSOLUTE COUNT: 0.7 10*9/L (ref 0.1–1.0)
MONOCYTES RELATIVE PERCENT: 5.2 %
NEUTROPHILS ABSOLUTE COUNT: 12.4 10*9/L — ABNORMAL HIGH (ref 1.7–7.7)
NEUTROPHILS RELATIVE PERCENT: 86 %
PLATELET COUNT: 307 10*9/L (ref 150–450)
RED BLOOD CELL COUNT: 4.99 10*12/L (ref 3.90–5.03)
WBC ADJUSTED: 14.5 10*9/L — ABNORMAL HIGH (ref 3.5–10.5)

## 2020-02-19 LAB — COMPREHENSIVE METABOLIC PANEL
ALKALINE PHOSPHATASE: 93 U/L (ref 46–116)
ALT (SGPT): 17 U/L (ref 10–49)
ANION GAP: 9 mmol/L (ref 3–11)
AST (SGOT): 22 U/L (ref <34)
BILIRUBIN TOTAL: 0.3 mg/dL (ref 0.3–1.2)
BLOOD UREA NITROGEN: 12 mg/dL (ref 9–23)
BUN / CREAT RATIO: 16
CALCIUM: 9.7 mg/dL (ref 8.7–10.4)
CHLORIDE: 110 mmol/L — ABNORMAL HIGH (ref 98–107)
CO2: 21.8 mmol/L (ref 20.0–31.0)
CREATININE: 0.74 mg/dL (ref 0.60–1.10)
EGFR CKD-EPI AA FEMALE: >90 mL/min/{1.73_m2}
EGFR CKD-EPI NON-AA FEMALE: >90 mL/min/{1.73_m2}
GLUCOSE RANDOM: 100 mg/dL (ref 70–179)
POTASSIUM: 4 mmol/L (ref 3.5–5.1)
PROTEIN TOTAL: 8 g/dL (ref 5.7–8.2)
SODIUM: 141 mmol/L (ref 135–145)

## 2020-02-19 LAB — RBC UA: Erythrocytes:Naric:Pt:Urine sed:Qn:Microscopy.light.HPF: 0

## 2020-02-19 LAB — POTASSIUM: Potassium:SCnc:Pt:Ser/Plas:Qn:: 4

## 2020-02-19 LAB — LIPASE: Triacylglycerol lipase:CCnc:Pt:Ser/Plas:Qn:: 28

## 2020-02-19 LAB — RED BLOOD CELL COUNT: Lab: 4.99

## 2020-02-19 MED ORDER — PROMETHAZINE 25 MG TABLET
ORAL_TABLET | Freq: Four times a day (QID) | ORAL | 0 refills | 8.00000 days | Status: CP | PRN
Start: 2020-02-19 — End: 2020-02-26

## 2020-02-19 NOTE — Unmapped (Signed)
Pt reports emesis and mid abdominal pain since noon.  Also reports migraine x 2 weeks.

## 2020-02-19 NOTE — Unmapped (Signed)
[facility]  Emergency Department Provider Note          ED Clinical Impression      Final diagnoses:   Non-intractable vomiting with nausea, unspecified vomiting type (Primary)           Impression, ED Course, Assessment and Plan      Impression: Patient is a 34 y.o. female with PMH of gestational diabetes mellitus, HIV (undetectable viral load as of 09/26/18), and pseudotumor cerebri presenting to the ED for epigastric abdominal pain and persistent non bloody emesis onset approximately 5 hours ago.    On exam, the patient appears well and is in NAD. Vital signs are within normal limits. Patient's exam is significant for tenderness to palpation to the RUQ and epigastric region. Abdomen soft and non distended. Lungs clear to auscultation bilaterally. Heart rate and rhythm regular. No gross focal neurological deficits appreciated.    Differential includes pancreatitis versus cholecystitis versus cholelithiasis versus gastroenteritis. Lower suspicion for appendicitis given no lower abdominal tenderness.    Plan for labs, lipase, and UA. Will obtain RUQ [place]. Will give morphine, zofran, phenergan, and IV fluids.    17:38 - CBC significant for elevated white count to 14.5.    19:15 -remainder of labs are reassuring. US abdomen did not reveal gallstones or biliary dilatation. Patient was able to tolerate p.o. and feels significantly improved. Suspect gastroenteritis. Will discharge home with prescription for Phenergan.    Discussed my evaluation of the patient's symptoms, my clinical impression, and my proposed outpatient treatment plan with patient. We have discussed anticipatory guidance, scheduled follow-up, and careful return precautions. Patient expresses understanding, is agreeable and comfortable with the discharge plan. Denies any questions or concerns at this time. NAD noted upon discharge.       Additional Medical Decision Making     I have reviewed the vital signs and the nursing notes. Labs and radiology results that were available during my care of the patient were independently reviewed by me and considered in my medical decision making.     I independently visualized the radiology images.   I reviewed the patient's prior medical records (ED encounter on 03/24/2018).       Portions of this record have been created using Scientist, clinical (histocompatibility and immunogenetics). Dictation errors have been sought, but may not have been identified and corrected.  ____________________________________________         History        Chief Complaint  Emesis      HPI   Morgan Hebert is a 34 y.o. female with PMH of gestational diabetes mellitus, HIV (undetectable viral load as of 09/26/18), and pseudotumor cerebri presenting to the ED for evaluation of emesis. The patient endorses onset of epigastric abdominal pain and persistent non bloody emesis approximately 5 hours ago. States that her abdominal pain radiates through to her back. The patient reports that she woke up this morning feeling fine. She took a Zofran to manage her nausea with mild relief. States that none of the other members of her house are displaying similar symptoms. Her last PO intake was last night. Patient occasionally consumes EtOH, but denies use within the past 24 hours. Reports an abdominal surgical history of emergency C-section and tubal ligation. Denies recent travels or known COVID-19 contacts. Further denies fever, chills, shortness of breath, chest pain, cough, diarrhea, numbness/tingling, vision changes, urinary changes, or headache.    Past Medical History:   Diagnosis Date   ??? Abnormal mammogram    ???  Constipation    ??? Diarrhea    ??? 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 Environmental allergies    ??? GDM- diet controlled 03/07/2019   ??? HIV (human immunodeficiency virus infection) (CMS-HCC)    ??? HIV (human immunodeficiency virus infection) (CMS-HCC)    ??? IUD (intrauterine device) in place    ??? Migraine    ??? Obesity    ??? Pseudotumor cerebri    ??? Rubella non-immune status, antepartum 01/14/2019    [ ]  PP MMR   ??? STD (sexually transmitted disease)    ??? Supervision of high risk pregnancy, unspecified, unspecified trimester 05/30/2018    Dating: 9/21 (Unsure of LMP as had a D&C for a missed AB in 06/2018 and discovered she was pregnant in 07/2018)                                    Prenatal Screening:  [x]  Prenatal labs reviewed [x]  SMA 2 copies/Hgb electropheresis wnl/CF [ ]   [x]  1 hr GTT= 250 but done 3 days after BMZ (4/22 and 4/23) --> Repeat 1 hr GTT  5-18= 229  [ ]  28 wk CBC/RPR [ ]  GBS   Fetal Screening:  [x]  aneuploidy scre   ??? Threatened premature labor in third trimester 01/31/2019    Received BMZ 4/22 ([place])-4/23 [facility]) for concern for PTL. SVE cl/th/hi.       Patient Active Problem List   Diagnosis   ??? Pseudotumor cerebri   ??? HIV   ??? Lymphadenopathy   ??? GDM- diet controlled       Past Surgical History:   Procedure Laterality Date   ??? DILATION AND CURETTAGE OF UTERUS     ??? LUMBAR PUNCTURE     ??? PR CESAREAN DELIVERY ONLY N/A 03/29/2019    Procedure: CESAREAN DELIVERY ONLY;  Surgeon: Asher Muir, MD;  Location: L&D C-SECTION OR SUITES [facility];  Service: Maternal-Fetal Medicine   ??? PR COLONOSCOPY W/BIOPSY SINGLE/MULTIPLE  07/03/2014    Procedure: COLONOSCOPY, FLEXIBLE, PROXIMAL TO SPLENIC FLEXURE; WITH BIOPSY, SINGLE OR MULTIPLE;  Surgeon: Billie Ruddy, MD;  Location: GI PROCEDURES [facility];  Service: Gastroenterology   ??? PR DILATION/CURETTAGE,DIAGNOSTIC N/A 06/14/2018    Procedure: DILATION AND CURETTAGE, DIAGNOSTIC AND/OR THERAPEUTIC (NON OBSTETRICAL);  Surgeon: Nelle Don, MD;  Location: [facility] OR [facility];  Service: [facility] Primary Gynecology   ??? PR LAP,RMV  ADNEXAL STRUCTURE Bilateral 06/04/2019    Procedure: R21  LAPAROSCOPY, SURGICAL; W/REMOVAL OF ADNEXAL STRUCTURES (PARTIAL OR TOTAL OOPHORECTOMY &/OR SALPINGECTOMY);  Surgeon: Joneen Caraway, MD;  Location: [facility] OR [facility];  Service: Family Planning   ??? PR UPPER GI ENDOSCOPY,BIOPSY N/A 07/03/2014    Procedure: UGI ENDOSCOPY; WITH BIOPSY, SINGLE OR MULTIPLE;  Surgeon: Billie Ruddy, MD;  Location: [facility] PROCEDURES [facility] [facility];  Service: Gastroenterology   ??? WISDOM TOOTH EXTRACTION         No current facility-administered medications for this encounter.    Current Outpatient Medications:   ???  zonisamide (ZONEGRAN) 50 MG capsule, Start at 50mg  daily; if  well-tolerated after 2 weeks, increase to 100mg  daily, Disp: 90 capsule, Rfl: 3  ???  acetaminophen (TYLENOL) 325 MG tablet, Take 2 tablets (650 mg total) by mouth Every six (6) hours., Disp: 120 tablet, Rfl: 0  ???  acetaZOLAMIDE (DIAMOX) 500 mg ER 12 hr capsule, , Disp: , Rfl:   ???  bictegrav-emtricit-tenofov ala (BIKTARVY) 50-200-25 mg tablet, Take 1 tablet by mouth daily., Disp: 30 tablet, Rfl: 11  ???  hydrocortisone 1 % ointment, , Disp: , Rfl:   ???  ibuprofen (MOTRIN) 600 MG tablet, Take 1 tablet (600 mg total) by mouth Every six (6) hours., Disp: 60 tablet, Rfl: 0  ???  ketoconazole (NIZORAL) 2 % cream, , Disp: , Rfl:   ???  ondansetron (ZOFRAN) 4 MG tablet, TAKE ONE TABLET BY MOUTH EVERY 4 HOURS AS NEEDED FOR NAUSEA AND VOMITING, Disp: 30 tablet, Rfl: 0    Allergies  Peanut    Family History   Problem Relation Age of Onset   ??? Cervical cancer Mother    ??? Diabetes Mother    ??? Asthma Mother    ??? Hypertension Mother    ??? Diabetes Maternal Aunt    ??? Hypertension Maternal Aunt    ??? Breast cancer Maternal Grandmother    ??? Cancer Maternal Grandmother    ??? No Known Problems Daughter    ??? Glaucoma Neg Hx        Social History Social History     Tobacco Use   ??? Smoking status: Former Smoker     Types: Cigarettes   ??? Smokeless tobacco: Never Used   Substance Use Topics   ??? Alcohol use: Not Currently     Alcohol/week: 0.0 standard drinks     Comment: rare   ??? Drug use: Not Currently     Types: Marijuana       Review of Systems     Constitutional: Negative for fever.  Eyes: Negative for visual changes.  ENT: Negative for sore throat.  Cardiovascular: Negative for chest pain.  Respiratory: Negative for shortness of breath.  Gastrointestinal: Positive for abdominal pain and vomiting. Negative for diarrhea.  Genitourinary: Negative for dysuria.  Musculoskeletal: Negative for back pain.  Skin: Negative for rash.  Neurological: Negative for headaches, focal weakness or numbness.       Physical Exam     This provider entered the patient's room: Yes:    ??? If this provider did not enter the room, a comprehensive physical exam was not able to be performed due to increased infection risk to themselves, other providers, staff and other patients), as well as to conserve personal protective equipment (PPE) utilization during the COVID-19 pandemic.    ??? If this provider did enter the patient room, the following was PPE worn: Surgical mask, eye protection and gloves    ED Triage Vitals [02/19/20 1631]   Enc Vitals Group      BP 112/81      Heart Rate 94      SpO2 Pulse       Resp 22      Temp 36.8 ??C (98.2 ??F)      Temp Source Oral      SpO2 100 %      Weight 78.9 kg (174 lb)      Height 1.499 m (4' 11)       Constitutional: Alert and oriented. Well appearing and in no distress.  Eyes: Conjunctivae are normal.  ENT  Head: Normocephalic and atraumatic.       Nose: No congestion.  Cardiovascular: Normal rate, regular rhythm. Normal and symmetric distal pulses are present in all extremities.  Respiratory: Normal respiratory effort. Breath sounds are normal.  Gastrointestinal: Tenderness to palpation to the RUQ and epigastric region. Soft and nondistended. There is no CVA tenderness.  Musculoskeletal: Normal range of motion in all extremities.       Right lower leg: No tenderness or edema.       Left lower leg: No tenderness or edema.  Neurologic: Normal speech and language. No gross focal neurologic deficits are appreciated.  Skin: Skin is warm, dry and intact. No rash noted.  Psychiatric: Mood and affect are normal. Speech and behavior are normal.     Radiology     US Abdomen Limited   Final Result   No gallstones or biliary dilatation.      Please see below for data measurements:      Liver: 14.3 cm      Gallbladder wall: 0.17 cm   Sonographic Murphy's Sign: patient medicated   Pericholecystic fluid visualized: no      Common hepatic duct: 0.18 cm   Proximal common bile duct: 0.22 cm   Distal common bile duct: 0.27 cm      Right kidney: 11.0 cm                       Documentation assistance was provided by Helen Hashimoto, Scribe, on Feb 19, 2020 at 4:59 PM for Shaune Leeks, MD.    Feb 19, 2020 8:10 PM. Documentation assistance provided by the scribe. I was present during the time the encounter was recorded. The information recorded by the scribe was done at my direction and has been reviewed and validated by me.          Sherryl Barters, MD  02/19/20 2014

## 2020-02-21 NOTE — Unmapped (Signed)
I received a voice message from pharmacy to confirm if patient is supposed to be taking both acetazolamide and zonisamide concurrently.  Pharmacy number is [phone].  Please advise, and I will call back.

## 2020-02-24 NOTE — Unmapped (Signed)
I spoke to pharmacist Selena Batten and confirmed that per Dr. Bing Matter, patient should be taking both of the aforementioned medications.

## 2020-02-28 MED FILL — BIKTARVY 50 MG-200 MG-25 MG TABLET: 30 days supply | Qty: 30 | Fill #4 | Status: AC

## 2020-02-28 MED FILL — BIKTARVY 50 MG-200 MG-25 MG TABLET: ORAL | 30 days supply | Qty: 30 | Fill #4

## 2020-02-28 NOTE — Unmapped (Signed)
[facility] Refill Coordination Note    Specialty Medication(s) to be Shipped:   Infectious Disease: Biktarvy    Other medication(s) to be shipped: n/a     Morgan Hebert, DOB: 1986/08/25  Phone: [phone] (home) [phone] (work)      All above HIPAA information was verified with patient.     Was a Nurse, learning disability used for this call? No    Completed refill call assessment today to schedule patient's medication shipment from the [facility] [phone]).       Specialty medication(s) and dose(s) confirmed: Regimen is correct and unchanged.   Changes to medications: Morgan Hebert reports no changes at this time.  Changes to insurance: No  Questions for the pharmacist: No    Confirmed patient received Welcome Packet with first shipment. The patient will receive a drug information handout for each medication shipped and additional FDA Medication Guides as required.       DISEASE/MEDICATION-SPECIFIC INFORMATION        N/A    SPECIALTY MEDICATION ADHERENCE     Medication Adherence    Patient reported X missed doses in the last month: 1  Specialty Medication: biktarvy- pt ran out because husband actually threw med out                biktarvy  : 0 days of medicine on hand         SHIPPING     Shipping address confirmed in Epic.     Delivery Scheduled: Yes, Expected medication delivery date: 5/21.     Medication will be delivered via Same Day Courier to the prescription address in Epic WAM.    [name]   [facility] Specialty Technician

## 2020-03-30 NOTE — Unmapped (Signed)
[facility] Clinical Assessment & Refill Coordination Note    AZRIEL DANCY, DOB: 04-06-1986  Phone: [phone] (home) [phone] (work)    All above HIPAA information was verified with patient.     Was a Nurse, learning disability used for this call? No     I told Ms. Pugh that her voicemail is full    Specialty Medication(s):   Infectious Disease: Biktarvy     Current Outpatient Medications   Medication Sig Dispense Refill   ??? acetaminophen (TYLENOL) 325 MG tablet Take 2 tablets (650 mg total) by mouth Every six (6) hours. 120 tablet 0   ??? acetaZOLAMIDE (DIAMOX) 500 mg ER 12 hr capsule      ??? bictegrav-emtricit-tenofov ala (BIKTARVY) 50-200-25 mg tablet Take 1 tablet by mouth daily. 30 tablet 11   ??? hydrocortisone 1 % ointment      ??? ibuprofen (MOTRIN) 600 MG tablet Take 1 tablet (600 mg total) by mouth Every six (6) hours. 60 tablet 0   ??? ketoconazole (NIZORAL) 2 % cream      ??? ondansetron (ZOFRAN) 4 MG tablet TAKE ONE TABLET BY MOUTH EVERY 4 HOURS AS NEEDED FOR NAUSEA AND VOMITING 30 tablet 0   ??? zonisamide (ZONEGRAN) 50 MG capsule Start at 50mg  daily; if well-tolerated after 2 weeks, increase to 100mg  daily 90 capsule 3     No current facility-administered medications for this visit.        Changes to medications: Vicenta reports starting the following medications: zonisamide. Now on 100mg  daily    Allergies   Allergen Reactions   ??? 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       Changes to allergies: No    SPECIALTY MEDICATION ADHERENCE     Biktarvy   : 8 days of medicine on hand       Medication Adherence    Patient reported X missed doses in the last month: 0  Specialty Medication: Biktarvy  Patient is on additional specialty medications: No  Any gaps in refill history greater than 2 weeks in the last 3 months: no  Demonstrates understanding of importance of adherence: yes  Informant: patient  Provider-estimated medication adherence level: good  Patient is at risk for Non-Adherence: No          Specialty medication(s) dose(s) confirmed: Regimen is correct and unchanged.     Are there any concerns with adherence? No    Adherence counseling provided? Not needed    CLINICAL MANAGEMENT AND INTERVENTION      Clinical Benefit Assessment:    Do you feel the medicine is effective or helping your condition? Yes     HIV ASSOCIATED LABS:     HIV-1, Viral Load Det by PCR  Order: [id number]  Status:  Edited Result - FINAL   Visible to patient:  Yes (MyChart)   Next appt:  05/27/2020 at 10:30 AM in Infectious Diseases Neldon Mc, MD)   0 Result Notes    Ref Range & Units 3 mo ago    HIV-1 RNA by PCR copies/mL <20    Comment: HIV-1 RNA detected   The reportable range for this assay is 20 to 10,000,000   copies HIV-1 RNA/mL.     log10 HIV-1 RNA log10copy/mL CANCELED    Comment: Unable to calculate result since non-numeric result obtained for   component test.     Result canceled by the ancillary.  Resulting Agency  01      Narrative  Performed by: Verdell Carmine  Performed at: ??[address]   [address], [place], [place] ??[id number]   Lab Director: Jolene Schimke MD, Phone: ??[phone]      Specimen Collected: 12/02/19 09:50 Last Resulted: 12/04/19 07:37                 Lab Results   Component Value Date/Time    HIVRS Detected (A) 02/25/2019 10:12 AM    HIVRS Not Detected 09/26/2018 11:07 AM    HIVRS Detected (A) 07/04/2018 02:06 PM    HIVRS 42,400 10/23/2017 12:00 AM    HIVRS not detected 03/29/2017 12:00 AM    HIVRS detected 10/25/2016 12:00 AM    HIVCP <40 (H) 02/25/2019 10:12 AM    HIVCP <40 (H) 07/04/2018 02:06 PM    HIVCP detected 10/23/2017 12:00 AM    HIVCP <40 (H) 05/03/2017 02:29 PM    HIVCP <20 03/29/2017 12:00 AM    HIVCP <20 10/25/2016 12:00 AM    RCD4 54.4 05/01/2015 12:00 AM    RCD4 53.7 10/24/2014 12:00 AM    ACD4 1,118 10/22/2018 09:45 AM    ACD4 1,696 (A) 05/01/2015 12:00 AM    ACD4 967 10/24/2014 12:00 AM    ACD4 854 06/23/2014 12:26 PM       Clinical Benefit counseling provided? Labs from 12/02/2019 show evidence of clinical benefit    Adverse Effects Assessment:    Are you experiencing any side effects? No    Are you experiencing difficulty administering your medicine? No    Quality of Life Assessment:    How many days over the past month did your HIV  keep you from your normal activities? For example, brushing your teeth or getting up in the morning. 0    Have you discussed this with your provider? Not needed    Therapy Appropriateness:    Is therapy appropriate? Yes, therapy is appropriate and should be continued    DISEASE/MEDICATION-SPECIFIC INFORMATION      N/A    PATIENT SPECIFIC NEEDS     - Does the patient have any physical, cognitive, or cultural barriers? No    - Is the patient high risk? No     - Does the patient require a Care Management Plan? No     - Does the patient require physician intervention or other additional services (i.e. nutrition, smoking cessation, social work)? No      SHIPPING     Specialty Medication(s) to be Shipped:   Infectious Disease: Biktarvy    Other medication(s) to be shipped: n/a     Changes to insurance: No    Delivery Scheduled: Yes, Expected medication delivery date: 04/01/20.     Medication will be delivered via Next Day Courier to the confirmed prescription address in [facility].    The patient will receive a drug information handout for each medication shipped and additional FDA Medication Guides as required.  Verified that patient has previously received a Conservation officer, historic buildings.    All of the patient's questions and concerns have been addressed.    Roderic Palau   [facility] Specialty Pharmacist

## 2020-03-31 MED FILL — BIKTARVY 50 MG-200 MG-25 MG TABLET: 30 days supply | Qty: 30 | Fill #5 | Status: AC

## 2020-03-31 MED FILL — BIKTARVY 50 MG-200 MG-25 MG TABLET: ORAL | 30 days supply | Qty: 30 | Fill #5

## 2020-04-09 DIAGNOSIS — G932 Benign intracranial hypertension: Principal | ICD-10-CM

## 2020-04-09 MED ORDER — ZONISAMIDE 50 MG CAPSULE
ORAL_CAPSULE | 3 refills | 0 days
Start: 2020-04-09 — End: ?

## 2020-04-09 NOTE — Unmapped (Signed)
Faxed refill request received from pharmacy: Morgan Hebert    Provider: Dr. Bing Matter    Last Visit Date: 05/15/2019    Next Visit Date: Visit date not found    No results found for: CBC, CMP     No results found for this or any previous visit.

## 2020-04-14 MED ORDER — ZONISAMIDE 50 MG CAPSULE
ORAL_CAPSULE | 3 refills | 0 days | Status: CP
Start: 2020-04-14 — End: ?

## 2020-04-23 NOTE — Unmapped (Signed)
[facility] Refill Coordination Note    Specialty Medication(s) to be Shipped:   Infectious Disease: Biktarvy    Other medication(s) to be shipped:       Morgan Hebert, DOB: 09/04/1986  Phone: [phone] (home) [phone] (work)      All above HIPAA information was verified with patient.     Was a Nurse, learning disability used for this call? No    Completed refill call assessment today to schedule patient's medication shipment from the [facility] [phone]).       Specialty medication(s) and dose(s) confirmed: Regimen is correct and unchanged.   Changes to medications: Ahnika reports no changes at this time.  Changes to insurance: No  Questions for the pharmacist: No    Confirmed patient received Welcome Packet with first shipment. The patient will receive a drug information handout for each medication shipped and additional FDA Medication Guides as required.       DISEASE/MEDICATION-SPECIFIC INFORMATION        N/A    SPECIALTY MEDICATION ADHERENCE     Medication Adherence    Patient reported X missed doses in the last month: 0  Specialty Medication: biktarvy 50-200-25mg   Patient is on additional specialty medications: No  Informant: patient  Reliability of informant: reliable  Patient is at risk for Non-Adherence: No                biktarvy 50-200-25 mg: 8 days of medicine on hand         SHIPPING     Shipping address confirmed in Epic.     Delivery Scheduled: Yes, Expected medication delivery date: 07/20.     Medication will be delivered via Next Day Courier to the prescription address in Epic WAM.    Antonietta Barcelona   [facility] Specialty Technician

## 2020-04-27 MED FILL — BIKTARVY 50 MG-200 MG-25 MG TABLET: ORAL | 30 days supply | Qty: 30 | Fill #6

## 2020-04-27 MED FILL — BIKTARVY 50 MG-200 MG-25 MG TABLET: 30 days supply | Qty: 30 | Fill #6 | Status: AC

## 2020-05-08 LAB — T-LYMPHOCYTE HELPER/SUPPRESSOR
% CD 3 POS. LYMPH.: 83.1 % (ref 57.5–86.2)
ABSOLUTE CD 3: 2493 /uL — ABNORMAL HIGH (ref 622–2402)
ABSOLUTE CD8 CNT: 873 /uL (ref 109–897)
BANDED NEUTROPHILS ABSOLUTE COUNT: 0 10*3/uL (ref 0.0–0.1)
BASOPHILS ABSOLUTE COUNT: 0.1 10*3/uL (ref 0.0–0.2)
BASOPHILS RELATIVE PERCENT: 1 %
CD4 % HELPER T CELL: 54 % (ref 30.8–58.5)
CD4 T CELL ABSOLUTE: 1620 /uL — ABNORMAL HIGH (ref 359–1519)
CD4:CD8 RATIO: 1.86 (ref 0.92–3.72)
CD8 % SUPPRESSOR T CELL: 29.1 % (ref 12.0–35.5)
HEMATOCRIT: 38.7 % (ref 34.0–46.6)
HEMOGLOBIN: 13.2 g/dL (ref 11.1–15.9)
IMMATURE GRANULOCYTES: 0 %
LYMPHOCYTES ABSOLUTE COUNT: 3 10*3/uL (ref 0.7–3.1)
LYMPHOCYTES RELATIVE PERCENT: 39 %
MEAN CORPUSCULAR HEMOGLOBIN CONC: 34.1 g/dL (ref 31.5–35.7)
MEAN CORPUSCULAR HEMOGLOBIN: 30.1 pg (ref 26.6–33.0)
MEAN CORPUSCULAR VOLUME: 88 fL (ref 79–97)
MONOCYTES ABSOLUTE COUNT: 0.6 10*3/uL (ref 0.1–0.9)
MONOCYTES RELATIVE PERCENT: 7 %
NEUTROPHILS ABSOLUTE COUNT: 3.6 10*3/uL (ref 1.4–7.0)
NEUTROPHILS RELATIVE PERCENT: 49 %
PLATELET COUNT: 314 10*3/uL (ref 150–450)
RED BLOOD CELL COUNT: 4.38 x10E6/uL (ref 3.77–5.28)
RED CELL DISTRIBUTION WIDTH: 14.4 % (ref 11.7–15.4)
WHITE BLOOD CELL COUNT: 7.5 10*3/uL (ref 3.4–10.8)

## 2020-05-08 LAB — LIPID PANELX (LABCORP ONLY)
HDL CHOLESTEROL: 38 mg/dL — ABNORMAL LOW
TRIGLYCERIDES: 120 mg/dL (ref 0–149)
VLDL CHOLESTEROL CAL: 22 mg/dL (ref 5–40)

## 2020-05-08 LAB — HEMOGLOBIN A1C: Hemoglobin A1c/Hemoglobin.total:MFr:Pt:Bld:Qn:: 5.4

## 2020-05-08 LAB — ALB+ALP+ALT+AST+BUN+CA+CREA...
ALBUMIN: 4.3 g/dL (ref 3.8–4.8)
ALKALINE PHOSPHATASE: 79 IU/L (ref 48–121)
ALT (SGPT): 9 IU/L (ref 0–32)
BILIRUBIN TOTAL: 0.3 mg/dL (ref 0.0–1.2)
BLOOD UREA NITROGEN: 10 mg/dL (ref 6–20)
CALCIUM: 8.8 mg/dL (ref 8.7–10.2)
CREATININE: 0.86 mg/dL (ref 0.57–1.00)
GAMMA GLUTAMYL TRANSFERASE: 12 IU/L (ref 0–60)
GFR MDRD AF AMER: 102 mL/min/{1.73_m2}
GFR MDRD NON AF AMER: 88 mL/min/{1.73_m2}
GLUCOSE: 84 mg/dL (ref 65–99)
PHOSPHORUS, SERUM: 3.6 mg/dL (ref 3.0–4.3)

## 2020-05-08 LAB — RPR: Reagin Ab:PrThr:Pt:Ser:Ord:RPR: NONREACTIVE

## 2020-05-08 LAB — EOSINOPHILS ABSOLUTE COUNT: Eosinophils:NCnc:Pt:Bld:Qn:Automated count: 0.3

## 2020-05-08 LAB — LDL CHOLESTEROL CALCULATED: Cholesterol.in LDL:MCnc:Pt:Ser/Plas:Qn:Calculated: 81

## 2020-05-08 LAB — ALBUMIN: Albumin:MCnc:Pt:Ser/Plas:Qn:: 4.3

## 2020-05-08 NOTE — Unmapped (Signed)
Morgan Hebert is a lovely 34 y.o. female here for a STAR study visit on 05/07/2020. HIV positive participant. Denies complaints today. Pt is engaged in care at [facility], takes [place] for ARVs.     Pt seen today for a routine study visit without a clinician present.      Negative COVID-19 Screen: Wellness screening and temperature check conducted at clinic door. Participant reported no COVID-19 symptoms, no contact to a known or suspected COVID-19 case in the last 14 days, and temperature was within normal limits. Participant was cleared to enter the facility.     Study visit included:    Pregnancy Test: negative    Physical Exam:  Blood Pressure and Height/Weight     Labs Collected: CMP, CBC w/diff, CD4, HIV Viral Load, fasting glucose, HgA1C, fasting lipids, RPR, STI screen.     All labs results will be documented in Epic.

## 2020-05-09 LAB — LOG10 HIV-1 RNA

## 2020-05-11 NOTE — Unmapped (Signed)
05/11/20 Lab [name] results received and reviewed.    STI screen negative  RPR non reactive  HIV RNA <20  CD4 1620  A1C 5.4  Creat 0.86  TC 141 TRIG 120  HDL 38 LDL 81

## 2020-05-22 NOTE — Unmapped (Signed)
[facility] Refill Coordination Note    Specialty Medication(s) to be Shipped:   Infectious Disease: Biktarvy    Other medication(s) to be shipped: No additional medications requested for fill at this time     Morgan Hebert, DOB: 03/12/1986  Phone: [phone] (home) [phone] (work)      All above HIPAA information was verified with patient.     Was a Nurse, learning disability used for this call? No    Completed refill call assessment today to schedule patient's medication shipment from the [facility] [phone]).       Specialty medication(s) and dose(s) confirmed: Regimen is correct and unchanged.   Changes to medications: Machel reports no changes at this time.  Changes to insurance: No  Questions for the pharmacist: No    Confirmed patient received Welcome Packet with first shipment. The patient will receive a drug information handout for each medication shipped and additional FDA Medication Guides as required.       DISEASE/MEDICATION-SPECIFIC INFORMATION        N/A    SPECIALTY MEDICATION ADHERENCE     Medication Adherence    Patient reported X missed doses in the last month: 0  Specialty Medication: biktarvy            Unable to confirm quantity on hand      SHIPPING     Shipping address confirmed in Epic.     Delivery Scheduled: Yes, Expected medication delivery date: 8/18.     Medication will be delivered via Next Day Courier to the prescription address in Epic WAM.    [name]   [facility] Specialty Technician

## 2020-05-26 MED FILL — BIKTARVY 50 MG-200 MG-25 MG TABLET: ORAL | 30 days supply | Qty: 30 | Fill #7

## 2020-05-26 MED FILL — BIKTARVY 50 MG-200 MG-25 MG TABLET: 30 days supply | Qty: 30 | Fill #7 | Status: AC

## 2020-05-27 ENCOUNTER — Ambulatory Visit: Admit: 2020-05-27 | Discharge: 2020-05-28 | Payer: BLUE CROSS/BLUE SHIELD

## 2020-05-27 DIAGNOSIS — L989 Disorder of the skin and subcutaneous tissue, unspecified: Principal | ICD-10-CM

## 2020-05-27 MED ORDER — FLUOCINONIDE 0.05 % TOPICAL OINTMENT
Freq: Two times a day (BID) | TOPICAL | 1 refills | 0 days | Status: CP
Start: 2020-05-27 — End: 2021-05-27
  Filled 2020-06-03: qty 30, 30d supply, fill #0

## 2020-05-27 NOTE — Unmapped (Signed)
Assessment/Plan:      [name], a 34 y.o. female called today for routine HIV followup.    Plan:  HIV  Fills ART via private insurance. RW renewal needed for this period.  ?? Continue current therapy. E-prescribed at recent visit, no refills needed. She uses [facility].  ?? No labs today  - just had labs via research.  ?? Encouraged continued excellent ARV adherence.   Lab Results   Component Value Date    ACD4 1,118 10/22/2018    CD4 54 10/22/2018    HIVCP <40 (H) 02/25/2019    HIVRS Detected (A) 02/25/2019   05/11/20 [facility] (STAR study)  HIV RNA <20  CD4 1620    Mental Health  ?? Provided support regarding loss of her niece and aunt, surrounding separation with her husband.   ?? Overall doing well and feels safe.     Lower extremity edema  ?? Not present on exam today but she states intermittent, exacerbated by standing.  ?? I am reluctant to try a diuretic given therapy for her pseudotumor. She will monitor.    Pseudotumor cerebri  ?? Working with neurologists, symptoms are stable.  ?? For ophtho followup.    Skin lesions  ?? I had initially suspected fungal superinfection or seborrhea when these were more diffuse. I am not sure if these are eczematous. They are fairly dry and macular. She will try another steroid and monitor progress.     Sexual health & secondary prevention  Sex with men. Monogamous with single partner. She does disclose status. Never uses condoms.    Lab Results   Component Value Date    RPR Non Reactive 05/07/2020    CTNAA Negative 05/07/2020    CTNAA Negative 05/07/2020    CTNAA Negative 03/28/2019    GCNAA Negative 05/07/2020    GCNAA Negative 05/07/2020    GCNAA Negative 03/28/2019    SPECTYPE Swab 03/28/2019    SPECTYPE Swab 10/11/2018    SPECTYPE Urine 09/26/2018    SPECSOURCE Endocervix 03/28/2019    SPECSOURCE Endocervix 10/11/2018    SPECSOURCE Urine 09/26/2018     ?? GC/CT NAATs -- Negative 04/2020   ?? RPR -- NR 04/2020 - repeat 1Y      Health maintenance  Lab Results   Component Value Date CREATININE 0.86 05/07/2020    QFTTBGOLD NEGATIVE 02/13/2014    HEPCAB Nonreactive 10/22/2018    CHOL 141 05/07/2020    HDL 38 (L) 05/07/2020    LDL 81 05/07/2020    NONHDL 98 03/28/2018    TRIG 161 05/07/2020    A1C 5.4 05/07/2020    PAP Negative for intraephithelial lesion or malignancy 03/29/2017    FINALDX  03/29/2019     A: Placenta, delivery    - Preterm (36 weeks 1 days EGA) singleton third trimester placenta, weight 320 g (weight 10th percentile for EGA)   - No acute chorioamnionitis identified   - Three vessel umbilical cord with narrow diameter (0.9 cm) and a furcate insertion  - Placental plate: accelerated maturation of chorionic villi,  increased intervillous and subchorionic fibrin, focal fetal thrombotic vasculopathy, small placental infarction (1.2 cm)        This electronic signature is attestation that the pathologist personally reviewed the submitted material(s) and the final diagnosis reflects that evaluation.       Communicable diseases  # TB - no longer needed; negative IGRA 2015 and low/no risk  # HCV - negative 10/2018; rescreen w/Ab q1-2y  Cancer screening  # Anorectal - not yet done  # Colorectal - SCREEN AGE 2+  # Liver - not yet done  # Lung - not applicable    # Breast - SCREEN AGE 59+ -- Q1-2Y  # Cervical - neg cyto 03/2017 - needs repeat but was late today so we did not have time.     Cardiovascular disease  # The ASCVD Risk score Denman George DC Jorge Ny al., 2013) failed to calculate.    Immunization History   Administered Date(s) Administered   ??? Influenza Vaccine Quad (IIV4 PF) 70mo+ injectable 06/30/2015, 06/15/2016, 07/04/2018   ??? MMR 03/31/2019   ??? PNEUMOCOCCAL POLYSACCHARIDE 23 03/31/2019   ??? TdaP 02/05/2019     ?? Screening ordered today: none  ?? Immunizations ordered today: none We discussed importance of accessing COVID-19 vaccine and discussed this recommendation extensively. Recommended flu shot when available to her.     I spent 25 minutes with the patient. I spent an additional 10 minutes on pre- and post-visit activities.   Counseled as documented above regarding mental health, COVID-19 vaccine, medication adherence, weight management and healthy lifestyle choices and need for recommended screening tests.    Disposition  Return to clinic 3-4 months or sooner if needed.    [name], MD, MPH   [facility]  [address]   [place], [place] [address]  Phone: [phone]   Fax: [phone]       Subjective:      Chief Complaint   HIV followup    HPI  Return patient visit for Morgan RATTI, a 33 y.o. woman with now well-controlled HIV. Terrible few months - her 12yo niece committed suicide and her aunt passed away from cancer. She is no longer divorcing her husband and things are going well. Baby is doing well, as is her older daughter although loss of her cousin has been incredibly difficult. Now also has custody of her 14yo niece (older sister of niece who died).    Itchy lesion on L shoulder, under L breast - have been there for over a month. She has had these off and on for a while and has tried topical hydrocortisone and ketoconazole cream at different times.    Trying to lose weight - pleased with progress.    Smoking a few cigarettes a day - motivated to quit.    Past Medical History:   Diagnosis Date   ??? Abnormal mammogram    ??? Constipation    ??? Diarrhea    ??? 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 Environmental allergies    ??? GDM- diet controlled 03/07/2019   ??? HIV (human immunodeficiency virus infection) (CMS-HCC)    ??? HIV (human immunodeficiency virus infection) (CMS-HCC)    ??? IUD (intrauterine device) in place    ??? Migraine    ??? Obesity    ??? Pseudotumor cerebri    ??? Rubella non-immune status, antepartum 01/14/2019    [ ]  PP MMR   ??? STD (sexually transmitted disease)    ??? Supervision of high risk pregnancy, unspecified, unspecified trimester 05/30/2018  Dating: 9/21 (Unsure of LMP as had a D&C for a missed AB in 06/2018 and discovered she was pregnant in 07/2018)                                    Prenatal Screening:  [x]  Prenatal labs reviewed [x]  SMA 2 copies/Hgb electropheresis wnl/CF [ ]   [x]  1 hr GTT= 250 but done 3 days after BMZ (4/22 and 4/23) --> Repeat 1 hr GTT  5-18= 229  [ ]  28 wk CBC/RPR [ ]  GBS   Fetal Screening:  [x]  aneuploidy scre   ??? Threatened premature labor in third trimester 01/31/2019    Received BMZ 4/22 ([place])-4/23 [facility]) for concern for PTL. SVE cl/th/hi.     Medications and Allergies   Reviewed and updated today. See bottom of this visit's encounter summary for details.  Current Outpatient Medications on File Prior to Visit   Medication Sig   ??? acetaminophen (TYLENOL) 325 MG tablet Take 2 tablets (650 mg total) by mouth Every six (6) hours.   ??? acetaZOLAMIDE (DIAMOX) 500 mg ER 12 hr capsule    ??? bictegrav-emtricit-tenofov ala (BIKTARVY) 50-200-25 mg tablet Take 1 tablet by mouth daily.   ??? hydrocortisone 1 % ointment    ??? ibuprofen (MOTRIN) 600 MG tablet Take 1 tablet (600 mg total) by mouth Every six (6) hours.   ??? ketoconazole (NIZORAL) 2 % cream    ??? ondansetron (ZOFRAN) 4 MG tablet TAKE ONE TABLET BY MOUTH EVERY 4 HOURS AS NEEDED FOR NAUSEA AND VOMITING   ??? zonisamide (ZONEGRAN) 50 MG capsule Start at 50mg  daily; if well-tolerated after 2 weeks, increase to 100mg  daily     No current facility-administered medications on file prior to visit.     Allergies   Allergen Reactions   ??? 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     Social History  General ??? lives in [place] with her husband and her 2 daughters (born 2008, 2020). Also has temporary custody of niece.  ?? Doreene Adas and his girlfriend are there sometimes as well.   ?? Mom and sister are both positive (sister is perinatally infected, patient with presumed sexual transmission from female partner).  ?? Works at home for [employer], dreams of becoming a Clinical research associate or a Therapist, music. Loves vampire shows like True Blood.   Sexual History ??? sex with men   Substance Use ??? marijuana (smokes nightly), smokes maybe 3 tobacco cigarettes a day  Social History     Tobacco Use   ??? Smoking status: Former Smoker     Types: Cigarettes   ??? Smokeless tobacco: Never Used   Substance Use Topics   ??? Alcohol use: Not Currently     Alcohol/week: 0.0 standard drinks     Comment: rare     Review of Systems  As per HPI. Remainder of 10 systems reviewed, negative.      Objective:      BP 113/86 (BP Site: L Arm, BP Position: Sitting)  - Pulse 101  - Temp 36.1 ??C (97 ??F) (Temporal)  - Wt 75.7 kg (166 lb 12.8 oz)  - BMI 33.69 kg/m??   Wt Readings from Last 3 Encounters:   02/19/20 78.9 kg (174 lb)   12/25/19 82 kg (180 lb 11.2 oz)   05/15/19 76.7 kg (169 lb)  Const looks well and attentive, alert, appropriate   Eyes sclerae anicteric, noninjected OU   ENT TMs with good light reflexes bilaterally, clear fluid, all landmarks visible, no lesions in canal   Lymph no cervical or supraclavicular LAD   CV RRR. No murmurs. No rub or gallop. S1/S2.   Lungs CTAB ant/post, normal work of breathing   GI Soft, no organomegaly. NTND. NABS.   GU deferred   Rectal deferred   Skin no petechiae, ecchymoses or obvious rashes on clothed exam   MSK no joint tenderness and normal ROM throughout. No LE edema, feet warm with brisk DP/PT pulses.   Neuro CN II-XII grossly intact, MAEE, non focal   Psych Appropriate affect. Eye contact good. Linear thoughts. Fluent speech.     Laboratory Data  Reviewed in Epic today, using Synopsis and Chart Review filters.  05/11/20 [name] (STAR study)  STI screen negative  RPR non reactive  HIV RNA <20  CD4 1620  A1C 5.4  Creat 0.86  TC 141 TRIG 120  HDL 38 LDL 81    Lab Results   Component Value Date    CREATININE 0.86 05/07/2020    QFTTBGOLD NEGATIVE 02/13/2014    HEPCAB Nonreactive 10/22/2018    CHOL 141 05/07/2020    HDL 38 (L) 05/07/2020    LDL 81 05/07/2020    NONHDL 98 03/28/2018    TRIG 161 05/07/2020    A1C 5.4 05/07/2020    PAP Negative for intraephithelial lesion or malignancy 03/29/2017    FINALDX  03/29/2019     A: Placenta, delivery    - Preterm (36 weeks 1 days EGA) singleton third trimester placenta, weight 320 g (weight 10th percentile for EGA)   - No acute chorioamnionitis identified   - Three vessel umbilical cord with narrow diameter (0.9 cm) and a furcate insertion  - Placental plate: accelerated maturation of chorionic villi,  increased intervillous and subchorionic fibrin, focal fetal thrombotic vasculopathy, small placental infarction (1.2 cm)        This electronic signature is attestation that the pathologist personally reviewed the submitted material(s) and the final diagnosis reflects that evaluation.              _____________________________________________________________________

## 2020-05-27 NOTE — Unmapped (Addendum)
It was great to see you today.    I strongly recommend that you, your husband, and the older girls get vaccinated ASAP to prevent serious illness and reduce risk for the baby.    COVID vaccine resources  Video by an amazing MD at [facility], Dr. De Nurse.  [email or web address]     She was also part of a great Okey Regal TV special about the vaccine:  ShippingScam.co.uk     [employer], common myths, and a variety of perspectives from [facility]:  [email or web address]  https://[email or web address]://[email or web address]/  https://vaccine.[email or web address]        Click Here to Visit [facility] Covid-19 Vaccine Hub  Get the latest facts on the COVID-19 vaccines.    Please continue to check [email or web address] for open appointment times. Also, [employer] of Health and [employer] has more options for where you can get vaccinated. To learn more, call [phone] or visit [email or web address].[email or web address].    Please visit  Health???s COVID-19 Vaccine Hub at www.[email or web address] to review the latest facts about the vaccines.    ?? The ID clinic phone number is [phone].  ?? The ID clinic fax number is [phone].    Please note that your laboratory and other results may be visible to you in real time, possibly before they reach your provider. Please allow 48 hours for clinical interpretation of these results. Importantly, even if a result is flagged as abnormal, it may not be one that impacts your health.    For urgent issues on nights and weekends you may reach the ID Physician on call through the [facility] Operator at [phone].     URGENT CARE  Please call ahead to speak with the nursing staff if you are in need of an urgent appointment.       MEDICATIONS  For refills please contact your pharmacy and ask them to electronically send or fax the request to the clinic. Please bring all medications in original bottles to every appointment.    HMAP (formerly ADAP) or [employer] Eligibility (required even if you do not receive medication through [facility])  Please remember to renew your Juanell Fairly eligibility during renewal periods which occur twice a year: January-March and July-September.     The following are needed for each renewal:   - [facility] Identification (if you don't have one, then a bill with your name and address in [place])   - proof of income (award letter, W-2, or last three check stubs)   If you are unable to come in for renewal, let us know if we can mail, fax or e-mail paperwork to you.   HMAP Contact: [phone].     Neldon Mc, MD  [facility] Infectious Diseases Clinic at [facility]  [address]   [place], [place] [address]  Phone: [phone]   Fax: [phone]     Lab info:  Your most recent CD4 T-cell counts and viral loads are below. Here are a few things to keep in mind when looking at your numbers:  ?? Our goal is to get your virus to be undetectable and keep it undetectable. If the virus is undetectable you are much more likely to stay healthy.  ?? We consider your viral load to be undetectable if it says <40 or if it says Not detected.  ?? For most people, we're checking CD4 counts every other visit (once or twice a year, or sometimes even less).  ?? It's normal for your CD4 count to be different from  visit to visit.   ?? You can help by taking your medications at about the same time, every single day. If you're having trouble with taking your medications, it's important to let us know.    Lab Results   Component Value Date    ACD4 1,118 10/22/2018    CD4 54 10/22/2018    HIVCP <40 (H) 02/25/2019    HIVRS Detected (A) 02/25/2019

## 2020-05-30 DIAGNOSIS — L989 Disorder of the skin and subcutaneous tissue, unspecified: Principal | ICD-10-CM

## 2020-06-03 MED FILL — FLUOCINONIDE 0.05 % TOPICAL OINTMENT: 30 days supply | Qty: 30 | Fill #0 | Status: AC

## 2020-06-04 MED ORDER — ACETAZOLAMIDE ER 500 MG CAPSULE,EXTENDED RELEASE
ORAL_CAPSULE | 5 refills | 0 days
Start: 2020-06-04 — End: ?

## 2020-06-11 NOTE — Unmapped (Signed)
Patient complete RW application. Patient is eligible for RW B&C grant services and Caps on Charges. Eligibility expires 07/09/20.    RW Eligibility Form informing patient about RW services and Caps on charges was sent to patient.    If HMAP app: N/A    Larey Dresser, [place] [facility] Benefits Admin  Duration of intervention: 10 minutes

## 2020-06-12 ENCOUNTER — Encounter: Admit: 2020-06-12 | Discharge: 2020-06-13 | Payer: BLUE CROSS/BLUE SHIELD

## 2020-06-12 DIAGNOSIS — Z006 Encounter for examination for normal comparison and control in clinical research program: Principal | ICD-10-CM

## 2020-06-12 LAB — APTT: Coagulation surface induced:Time:Pt:PPP:Qn:Coag: 34.1

## 2020-06-12 LAB — PROTIME-INR: PROTIME: 12.8 s (ref 10.5–13.5)

## 2020-06-12 LAB — PROTIME: Coagulation tissue factor induced:Time:Pt:PPP:Qn:Coag: 12.8

## 2020-06-23 MED ORDER — ACETAZOLAMIDE ER 500 MG CAPSULE,EXTENDED RELEASE
ORAL_CAPSULE | 5 refills | 0 days
Start: 2020-06-23 — End: ?

## 2020-06-23 NOTE — Unmapped (Signed)
Pt. Needs an appointment.

## 2020-06-24 ENCOUNTER — Encounter: Admit: 2020-06-24 | Discharge: 2020-06-25 | Payer: BLUE CROSS/BLUE SHIELD

## 2020-06-24 DIAGNOSIS — G932 Benign intracranial hypertension: Principal | ICD-10-CM

## 2020-06-24 NOTE — Unmapped (Signed)
Morgan Hebert is a 34 y.o. 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    CC: blurry vision and loss of periferal vision.    Timeline:   12/2013 - Dx IIH. Headache, nausea, vomiting, migraines  - on/off since 2015. Diamox on/off - highest dose - 1500 BID, there was slight imrpovement.  05/2016: Laset seen by Dr. Cheryll Cockayne who noted no optic disc edema. OCT 12/01/15 showed Thinning of NFL temporally both eyes,  Left  > right, HVF: OD - WNL; OS peripheral depression of VF, new since the last exam in 01/2014. Plan was for follow-up in 3 months but patient was lost to f/u.  03/29/19: Gave birth to child. Patient noticed slight peripheral vision loss and blurred vision OU.    LP: 11/13/2013 - openning pressure: 42, closing 20 cm H2O  01/13/14, 01/08/2016, 01/01/18 - tried LP, but was not successful  There was one more LP during delivery - 03/29/19     Past Medical History: none    06/18/19: Height 4.11, Gain 30 lb during pregnancy, 175 lb    Previous images:  Decreased OCT RNLF and GCC OS>OD     Today's Exam:  Images (June 18, 2019):      MRI brain 11/13/2013, Dr. Katrinka Blazing:   IMPRESSION:Unremarkable MRI of the brain.    Assessment and plan:   - Blurry vision and loss of periferal vision in a patient with longstanding IIH that was on and off Diamox. She delivered a baby on 03/29/19 and she was a on Diamox 500 mg TID. However, no improvement with headaches and her periferal vision is getting worse. Onset of IIH was on 12/2013 and she had 2 LP done, first one was in 2015 and OP was 42 cm H2O and CP was 20 cm H20.   - Today, 06/24/20 all exams are stable comparing with visit 12/01/2015. Given this, will try to stop with Diamox for now. If any worsening of symptoms: blurry vision, whooshing sounds and HA, come back immediately.   - Every day migraine, taking multiple medication. Follow with neurologist.    - Last MRI done in 2015, so I am planning to repeat MRI brain and orbits w and w/o contrast  - Refer to bariatric team for loosing weight   - Follow up in 1 year  - I have spent more than 50% of the provider's face-to-face visit time with a patient is spent in counseling or coordination of care which is more then 45 minutes.   - I was present and involved in all parts of the service and the documentation is mine.

## 2020-06-25 ENCOUNTER — Ambulatory Visit: Admit: 2020-06-25 | Discharge: 2020-06-25 | Payer: PRIVATE HEALTH INSURANCE

## 2020-06-29 NOTE — Unmapped (Signed)
Patient complete RW application. Patient is eligible for RW B&C grant services and Caps on Charges. Eligibility expires 01/07/21.       RW Eligibility Form informing patient about RW services and Caps on charges was sent to patient.    If HMAP app: N/A    Larey Dresser, [place] [facility] Benefits Admin  Duration of intervention: 10 minutes

## 2020-07-01 NOTE — Unmapped (Signed)
[facility] Refill Coordination Note    Specialty Medication(s) to be Shipped:   Infectious Disease: Biktarvy    Other medication(s) to be shipped: fluocinonide oint     Morgan Hebert, DOB: 1986/03/03  Phone: [phone] (home) [phone] (work)      All above HIPAA information was verified with patient.     Was a Nurse, learning disability used for this call? No    Completed refill call assessment today to schedule patient's medication shipment from the [facility] [phone]).       Specialty medication(s) and dose(s) confirmed: Regimen is correct and unchanged.   Changes to medications: [place] reports no changes at this time.  Changes to insurance: No  Questions for the pharmacist: No    Confirmed patient received Welcome Packet with first shipment. The patient will receive a drug information handout for each medication shipped and additional FDA Medication Guides as required.       DISEASE/MEDICATION-SPECIFIC INFORMATION        N/A    SPECIALTY MEDICATION ADHERENCE     Medication Adherence    Patient reported X missed doses in the last month: 0  Specialty Medication: biktarvy          Unable to confirm quantity on hand      SHIPPING     Shipping address confirmed in Epic.     Delivery Scheduled: Yes, Expected medication delivery date: 9/28.     Medication will be delivered via Next Day Courier to the prescription address in Epic WAM.    [name]   [facility] Specialty Technician

## 2020-07-01 NOTE — Unmapped (Signed)
Study lab results entered.

## 2020-07-06 MED FILL — FLUOCINONIDE 0.05 % TOPICAL OINTMENT: 7 days supply | Qty: 30 | Fill #1 | Status: AC

## 2020-07-06 MED FILL — BIKTARVY 50 MG-200 MG-25 MG TABLET: ORAL | 30 days supply | Qty: 30 | Fill #8

## 2020-07-06 MED FILL — FLUOCINONIDE 0.05 % TOPICAL OINTMENT: TOPICAL | 7 days supply | Qty: 30 | Fill #1

## 2020-07-06 MED FILL — BIKTARVY 50 MG-200 MG-25 MG TABLET: 30 days supply | Qty: 30 | Fill #8 | Status: AC

## 2020-07-06 NOTE — Unmapped (Unsigned)
Internal Medicine Clinic Visit    Reason for visit: ***    A/P:    ***      There are no diagnoses linked to this encounter.  Health Maintenance:   Health Maintenance Due   Topic Date Due   ??? COVID-19 Vaccine (1) Never done   ??? Pap Smear (21-65)  05/08/2020   ??? Influenza Vaccine (1) 06/10/2020        No follow-ups on file.  Next Visit:   ???   __________________________________________________________    HPI:  Pt has pseudotumor cerebri, and HIV.   HIV: On Biktarvy  Lab Results   Component Value Date    ACD4 1,118 10/22/2018      Lab Results   Component Value Date    HIVRS Detected (A) 02/25/2019         ***  __________________________________________________________    Problem List:  Patient Active Problem List   Diagnosis   ??? Pseudotumor cerebri   ??? HIV   ??? Lymphadenopathy   ??? GDM- diet controlled       Medications:  Reviewed in EPIC    _________________________________________________________    Physical Exam:   Vital Signs:  There were no vitals filed for this visit.  Wt Readings from Last 3 Encounters:   06/25/20 74.4 kg (164 lb)   06/12/20 76 kg (167 lb 8.8 oz)   05/27/20 75.7 kg (166 lb 12.8 oz)     Gen: Well appearing, NAD  CV: RRR, no murmurs  Pulm: CTA bilaterally, no crackles or wheezes  Abd: Soft, NTND, normal BS. No HSM.  Ext: No edema  MSK:***  Psych:***  ***  Records review***  Last   Lab Results   Component Value Date    CREATININE 0.86 05/07/2020    CHOL 141 05/07/2020    HDL 38 (L) 05/07/2020    LDL 81 05/07/2020    NONHDL 98 03/28/2018    TRIG 295 05/07/2020    A1C 5.4 05/07/2020    PAP Negative for intraephithelial lesion or malignancy 03/29/2017        The ASCVD Risk score Denman George DC Montez Hageman, et al., 2013) failed to calculate.   Medication adherence and barriers to the treatment plan have been addressed. Opportunities to optimize healthy behaviors have been discussed. Patient / caregiver voiced understanding.    I personally spent *** minutes face-to-face and non-face-to-face in the care of this patient, which includes all pre, intra, and post visit time on the date of service.

## 2020-07-07 ENCOUNTER — Ambulatory Visit: Admit: 2020-07-07 | Payer: BLUE CROSS/BLUE SHIELD

## 2020-07-10 NOTE — Unmapped (Signed)
Medical Case Management Social Work Note    SW intern called pt to check in -- pt states that her baby is doing well and things are going well overall. Pt states that she applied for the state HOPE Program and that she hasn't heard back -- her application is still listed as in the intake phase on the program's self-service portal. SW intern stated that [facility] does not work with the [employer] program so are unable to clarify status of application but provided pt with the program's number from the [employer] Pt accepted phone number. SW intern encouraged pt to reach out should further needs arise.    Roswell Nickel, MSW Intern  Phone: [phone]

## 2020-08-06 DIAGNOSIS — L989 Disorder of the skin and subcutaneous tissue, unspecified: Principal | ICD-10-CM

## 2020-08-06 MED ORDER — FLUOCINONIDE 0.05 % TOPICAL OINTMENT
Freq: Two times a day (BID) | TOPICAL | 5 refills | 0 days
Start: 2020-08-06 — End: 2021-08-06

## 2020-08-06 NOTE — Unmapped (Signed)
[facility] Refill Coordination Note    Specialty Medication(s) to be Shipped:   Infectious Disease: Biktarvy    Other medication(s) to be shipped: fluocinonide oint- reqeusted refill     Morgan Hebert, DOB: 03-02-1986  Phone: [phone] (home) [phone] (work)      All above HIPAA information was verified with patient.     Was a Nurse, learning disability used for this call? No    Completed refill call assessment today to schedule patient's medication shipment from the [facility] [phone]).       Specialty medication(s) and dose(s) confirmed: Regimen is correct and unchanged.   Changes to medications: Morgan Hebert reports no changes at this time.  Changes to insurance: No  Questions for the pharmacist: No    Confirmed patient received Welcome Packet with first shipment. The patient will receive a drug information handout for each medication shipped and additional FDA Medication Guides as required.       DISEASE/MEDICATION-SPECIFIC INFORMATION        N/A    SPECIALTY MEDICATION ADHERENCE     Medication Adherence    Patient reported X missed doses in the last month: 0  Specialty Medication: Biktarvy 50-200-25mg             Unable to confirm quantity on hand.      SHIPPING     Shipping address confirmed in Epic.     Delivery Scheduled: Yes, Expected medication delivery date: 11/2.     Medication will be delivered via Next Day Courier to the prescription address in Epic WAM.    Morgan Hebert   [facility] Specialty Technician

## 2020-08-10 MED FILL — FLUOCINONIDE 0.05 % TOPICAL OINTMENT: TOPICAL | 30 days supply | Qty: 30 | Fill #0

## 2020-08-10 MED FILL — BIKTARVY 50 MG-200 MG-25 MG TABLET: 30 days supply | Qty: 30 | Fill #9 | Status: AC

## 2020-08-10 MED FILL — FLUOCINONIDE 0.05 % TOPICAL OINTMENT: 30 days supply | Qty: 30 | Fill #0 | Status: AC

## 2020-08-10 MED FILL — BIKTARVY 50 MG-200 MG-25 MG TABLET: ORAL | 30 days supply | Qty: 30 | Fill #9

## 2020-09-01 NOTE — Unmapped (Signed)
[facility] Clinical Assessment & Refill Coordination Note    Morgan Hebert, DOB: Jan 11, 1986  Phone: [phone] (home) [phone] (work)    All above HIPAA information was verified with patient.     Was a Nurse, learning disability used for this call? No    Specialty Medication(s):   Infectious Disease: Biktarvy     Current Outpatient Medications   Medication Sig Dispense Refill   ??? acetaminophen (TYLENOL) 325 MG tablet Take 2 tablets (650 mg total) by mouth Every six (6) hours. 120 tablet 0   ??? acetaZOLAMIDE (DIAMOX) 500 mg ER 12 hr capsule Take 1,500 mg by mouth Two (2) times a day.      ??? bictegrav-emtricit-tenofov ala (BIKTARVY) 50-200-25 mg tablet Take 1 tablet by mouth daily. 30 tablet 11   ??? fluocinonide (LIDEX) 0.05 % ointment Apply topically Two (2) times a day. 30 g 5   ??? ibuprofen (MOTRIN) 600 MG tablet Take 1 tablet (600 mg total) by mouth Every six (6) hours. 60 tablet 0   ??? ketoconazole (NIZORAL) 2 % cream  (Patient not taking: Reported on 06/24/2020)     ??? ondansetron (ZOFRAN) 4 MG tablet TAKE ONE TABLET BY MOUTH EVERY 4 HOURS AS NEEDED FOR NAUSEA AND VOMITING 30 tablet 0   ??? zonisamide (ZONEGRAN) 50 MG capsule Start at 50mg  daily; if well-tolerated after 2 weeks, increase to 100mg  daily 90 capsule 3     No current facility-administered medications for this visit.        Changes to medications: Morgan Hebert reports no changes at this time.    Allergies   Allergen Reactions   ??? 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       Changes to allergies: No    SPECIALTY MEDICATION ADHERENCE     Biktarvy   : 9 days of medicine on hand       Medication Adherence    Patient reported X missed doses in the last month: 0  Specialty Medication: Biktarvy  Patient is on additional specialty medications: No  Any gaps in refill history greater than 2 weeks in the last 3 months: no  Demonstrates understanding of importance of adherence: yes  Informant: patient  Provider-estimated medication adherence level: good  Patient is at risk for Non-Adherence: No          Specialty medication(s) dose(s) confirmed: Regimen is correct and unchanged.     Are there any concerns with adherence? No    Adherence counseling provided? Not needed    CLINICAL MANAGEMENT AND INTERVENTION      Clinical Benefit Assessment:    Do you feel the medicine is effective or helping your condition? Yes    HIV ASSOCIATED LABS:   Results for Morgan, Hebert (MRN [id number]) as of 09/01/2020 13:35   Ref. Range 03/28/2018 09:20 04/18/2018 16:12 05/25/2018 15:14 07/04/2018 14:06 09/26/2018 11:07 02/25/2019 10:12 12/02/2019 09:50 05/07/2020 09:52 06/12/2020 11:43 06/24/2020 08:23   HIV-1 RNA by PCR Latest Units: copies/mL 270      <20 <20     HIV RNA Unknown         <20 <20   log10 HIV-1 RNA Latest Units: log10copy/mL 2.431      CANCELED CANCELED     HIV RNA Latest Ref Range: <0 copies/mL    <40 (H)  <40 (H)       HIV RNA Log(10) Unknown    See Comment  See Comment       HIV RNA Quant Result Latest Ref Range: Not Detected   Not Detected Not Detected Detected (A) Not Detected Detected (A)       HIV RNA Comment Unknown  See Comment See Comment See Comment See Comment See Comment           Lab Results   Component Value Date/Time    HIVRS Detected (A) 02/25/2019 10:12 AM    HIVRS Not Detected 09/26/2018 11:07 AM    HIVRS Detected (A) 07/04/2018 02:06 PM    HIVRS 42,400 10/23/2017 12:00 AM    HIVRS not detected 03/29/2017 12:00 AM    HIVRS detected 10/25/2016 12:00 AM    HIVCP <20 06/24/2020 08:23 AM    HIVCP <20 06/12/2020 11:43 AM    HIVCP <40 (H) 02/25/2019 10:12 AM    HIVCP <40 (H) 07/04/2018 02:06 PM    HIVCP detected 10/23/2017 12:00 AM    RCD4 54.4 05/01/2015 12:00 AM    RCD4 53.7 10/24/2014 12:00 AM    ACD4 1,118 10/22/2018 09:45 AM    ACD4 1,696 (A) 05/01/2015 12:00 AM    ACD4 967 10/24/2014 12:00 AM    ACD4 854 06/23/2014 12:26 PM       Clinical Benefit counseling provided? Labs from 06/24/20 show evidence of clinical benefit    Adverse Effects Assessment:    Are you experiencing any side effects? No    Are you experiencing difficulty administering your medicine? No    Quality of Life Assessment:    How many days over the past month did your HIV  keep you from your normal activities? For example, brushing your teeth or getting up in the morning. 0    Have you discussed this with your provider? Not needed    Therapy Appropriateness:    Is therapy appropriate? Yes, therapy is appropriate and should be continued    DISEASE/MEDICATION-SPECIFIC INFORMATION      N/A    PATIENT SPECIFIC NEEDS     - Does the patient have any physical, cognitive, or cultural barriers? No    - Is the patient high risk? No    - Does the patient require a Care Management Plan? No     - Does the patient require physician intervention or other additional services (i.e. nutrition, smoking cessation, social work)? No      SHIPPING     Specialty Medication(s) to be Shipped:   Infectious Disease: Biktarvy    Other medication(s) to be shipped: fluocinonide 0.05% ointment     Changes to insurance: No    Delivery Scheduled: Yes, Expected medication delivery date: 09/02/20.     Medication will be delivered via Same Day Courier to the confirmed prescription address in [facility].    The patient will receive a drug information handout for each medication shipped and additional FDA Medication Guides as required.  Verified that patient has previously received a Conservation officer, historic buildings.    All of the patient's questions and concerns have been addressed.    Roderic Palau   [facility] Specialty Pharmacist

## 2020-09-02 MED FILL — FLUOCINONIDE 0.05 % TOPICAL OINTMENT: 30 days supply | Qty: 30 | Fill #1 | Status: AC

## 2020-09-02 MED FILL — BIKTARVY 50 MG-200 MG-25 MG TABLET: ORAL | 30 days supply | Qty: 30 | Fill #10

## 2020-09-02 MED FILL — FLUOCINONIDE 0.05 % TOPICAL OINTMENT: TOPICAL | 30 days supply | Qty: 30 | Fill #1

## 2020-09-02 MED FILL — BIKTARVY 50 MG-200 MG-25 MG TABLET: 30 days supply | Qty: 30 | Fill #10 | Status: AC

## 2020-09-30 NOTE — Unmapped (Signed)
[facility] Refill Coordination Note    Specialty Medication(s) to be Shipped:   Infectious Disease: Biktarvy    Other medication(s) to be shipped: flucinonide     [name], DOB: 22-Oct-1985  Phone: [phone] (home) [phone] (work)      All above HIPAA information was verified with patient.     Was a Nurse, learning disability used for this call? No    Completed refill call assessment today to schedule patient's medication shipment from the [facility] [phone]).       Specialty medication(s) and dose(s) confirmed: Regimen is correct and unchanged.   Changes to medications: Eastyn reports no changes at this time.  Changes to insurance: No  Questions for the pharmacist: No    Confirmed patient received Welcome Packet with first shipment. The patient will receive a drug information handout for each medication shipped and additional FDA Medication Guides as required.       DISEASE/MEDICATION-SPECIFIC INFORMATION        N/A    SPECIALTY MEDICATION ADHERENCE     Medication Adherence    Patient reported X missed doses in the last month: 0  Specialty Medication: biktarvy          Unable to confirm quantity on hand      SHIPPING     Shipping address confirmed in Epic.     Delivery Scheduled: Yes, Expected medication delivery date: 12/29.     Medication will be delivered via Next Day Courier to the prescription address in Epic WAM.    [name]   [facility] Specialty Technician

## 2020-10-06 MED FILL — FLUOCINONIDE 0.05 % TOPICAL OINTMENT: TOPICAL | 14 days supply | Qty: 30 | Fill #2

## 2020-10-06 MED FILL — BIKTARVY 50 MG-200 MG-25 MG TABLET: ORAL | 30 days supply | Qty: 30 | Fill #11

## 2020-10-06 MED FILL — BIKTARVY 50 MG-200 MG-25 MG TABLET: 30 days supply | Qty: 30 | Fill #11 | Status: AC

## 2020-10-06 MED FILL — FLUOCINONIDE 0.05 % TOPICAL OINTMENT: 14 days supply | Qty: 30 | Fill #2 | Status: AC

## 2020-10-28 ENCOUNTER — Ambulatory Visit: Admit: 2020-10-28 | Discharge: 2020-10-28 | Payer: BLUE CROSS/BLUE SHIELD

## 2020-10-28 DIAGNOSIS — Z23 Encounter for immunization: Principal | ICD-10-CM

## 2020-10-28 DIAGNOSIS — B20 Human immunodeficiency virus [HIV] disease: Principal | ICD-10-CM

## 2020-10-28 DIAGNOSIS — L989 Disorder of the skin and subcutaneous tissue, unspecified: Principal | ICD-10-CM

## 2020-10-28 LAB — HEPATITIS C ANTIBODY: HEPATITIS C ANTIBODY: NONREACTIVE

## 2020-10-28 LAB — CBC W/ AUTO DIFF
BASOPHILS ABSOLUTE COUNT: 0 10*9/L (ref 0.0–0.1)
BASOPHILS RELATIVE PERCENT: 0.9 %
EOSINOPHILS ABSOLUTE COUNT: 0.2 10*9/L (ref 0.0–0.7)
EOSINOPHILS RELATIVE PERCENT: 5.5 %
HEMATOCRIT: 41.9 % (ref 35.0–44.0)
HEMOGLOBIN: 13.6 g/dL (ref 12.0–15.5)
LYMPHOCYTES ABSOLUTE COUNT: 2.1 10*9/L (ref 0.7–4.0)
LYMPHOCYTES RELATIVE PERCENT: 55.1 %
MEAN CORPUSCULAR HEMOGLOBIN CONC: 32.5 g/dL (ref 30.0–36.0)
MEAN CORPUSCULAR HEMOGLOBIN: 28.7 pg (ref 26.0–34.0)
MEAN CORPUSCULAR VOLUME: 88.4 fL (ref 82.0–98.0)
MEAN PLATELET VOLUME: 9.4 fL (ref 7.0–10.0)
MONOCYTES ABSOLUTE COUNT: 0.3 10*9/L (ref 0.1–1.0)
MONOCYTES RELATIVE PERCENT: 7.6 %
NEUTROPHILS ABSOLUTE COUNT: 1.2 10*9/L — ABNORMAL LOW (ref 1.7–7.7)
NEUTROPHILS RELATIVE PERCENT: 30.9 %
PLATELET COUNT: 180 10*9/L (ref 150–450)
RED BLOOD CELL COUNT: 4.74 10*12/L (ref 3.90–5.03)
RED CELL DISTRIBUTION WIDTH: 15.2 % — ABNORMAL HIGH (ref 12.0–15.0)
WBC ADJUSTED: 3.8 10*9/L (ref 3.5–10.5)

## 2020-10-28 LAB — BASIC METABOLIC PANEL
ANION GAP: 2 mmol/L — ABNORMAL LOW (ref 5–14)
BLOOD UREA NITROGEN: 11 mg/dL (ref 9–23)
BUN / CREAT RATIO: 13
CALCIUM: 9.6 mg/dL (ref 8.7–10.4)
CHLORIDE: 109 mmol/L — ABNORMAL HIGH (ref 98–107)
CO2: 29.6 mmol/L (ref 20.0–31.0)
CREATININE: 0.83 mg/dL — ABNORMAL HIGH
EGFR CKD-EPI AA FEMALE: >90 mL/min/{1.73_m2} (ref >=60)
EGFR CKD-EPI NON-AA FEMALE: >90 mL/min/{1.73_m2} (ref >=60)
GLUCOSE RANDOM: 86 mg/dL (ref 70–179)
POTASSIUM: 4.3 mmol/L (ref 3.4–4.5)
SODIUM: 141 mmol/L (ref 135–145)

## 2020-10-28 LAB — BILIRUBIN, TOTAL: BILIRUBIN TOTAL: <0.2 mg/dL — ABNORMAL LOW (ref 0.3–1.2)

## 2020-10-28 LAB — AST: AST (SGOT): 20 U/L (ref <=34)

## 2020-10-28 LAB — ALT: ALT (SGPT): 15 U/L (ref 10–49)

## 2020-10-28 MED ORDER — BIKTARVY 50 MG-200 MG-25 MG TABLET
ORAL_TABLET | Freq: Every day | ORAL | 11 refills | 30.00000 days | Status: CP
Start: 2020-10-28 — End: 2021-10-28
  Filled 2020-11-04: qty 30, 30d supply, fill #0

## 2020-10-28 NOTE — Unmapped (Signed)
Addended by: Carney Corners F on: 10/28/2020 10:23 AM     Modules accepted: Orders

## 2020-10-28 NOTE — Unmapped (Signed)
PROMIS Tablet Screening  Completed Date: 10/28/2020     SW reviewed self-administered screening.    Patient had a PHQ-9 score of 3  indicating no depression.   Pt denies SI.   Pt scored 2 on AUDIT/AUDIT-C indicating Not-at-risk alcohol consumption  Pt  denies substance use in past 3 months.  Pt denies concerns for IPV.    Pt seen for regular ID visit.    Roswell Nickel, MSW Intern  Phone: [phone]

## 2020-10-28 NOTE — Unmapped (Addendum)
[facility] DISE[facility]ES CLIN[facility]  [address]  [place], [place]  [address]  P [phone]  F [phone]     Primary care provider: Jacquiline Doe, MD    Assessment/Plan:      Morgan Hebert, a 35 y.o. female called today for routine HIV followup.    Plan:  HIV  Fills ART via private insurance. RW renewal needed for this period.  Continue current therapy. E-prescribed today. She uses [facility].  No labs today  - just had labs via research.  Encouraged continued excellent ARV adherence.   Lab Results   Component Value Date    ACD4 1,118 10/22/2018    CD4 54 10/22/2018    HIVCP <20 06/24/2020    HIVRS Detected (A) 02/25/2019   05/11/20 [facility] (STAR study)  HIV RNA <20  CD4 1620    Mental Health  Provided support, in therapy locally.  Overall doing well and feels safe.     Pseudotumor cerebri  Working with neurologists, symptoms are stable.  For ophtho followup.    Skin lesions  I had initially suspected fungal superinfection or seborrhea when these were more diffuse. I am not sure if these are eczematous. They are fairly dry and macular. She has tried steroids and antifungals and requests dermatology referral. Provided referral to [facility].    Sexual health & secondary prevention  Sex with men. Monogamous with single partner. She does disclose status. Never uses condoms. Has BTL.    Lab Results   Component Value Date    RPR Non Reactive 05/07/2020    LABRPR Nonreactive 06/12/2020    CTNAA Negative 05/07/2020    CTNAA Negative 05/07/2020    CTNAA Negative 03/28/2019    GCNAA Negative 05/07/2020    GCNAA Negative 05/07/2020    GCNAA Negative 03/28/2019    SPECTYPE Swab 03/28/2019    SPECTYPE Swab 10/11/2018    SPECTYPE Urine 09/26/2018    SPECSOURCE Endocervix 03/28/2019    SPECSOURCE Endocervix 10/11/2018    SPECSOURCE Urine 09/26/2018     GC/CT NAATs --  Negative 04/2020    RPR -- NR 04/2020 - repeat 1Y      Health maintenance  Lab Results   Component Value Date    CREATININE 0.86 05/07/2020 QFTTBGOLD NEGATIVE 02/13/2014    HEPCAB Negative 06/12/2020    CHOL 141 05/07/2020    HDL 38 (L) 05/07/2020    LDL 81 05/07/2020    NONHDL 98 03/28/2018    TRIG 130 05/07/2020    A1C 5.4 05/07/2020    PAP Negative for intraephithelial lesion or malignancy 03/29/2017    FINALDX  03/29/2019     A: Placenta, delivery    - Preterm (36 weeks 1 days EGA) singleton third trimester placenta, weight 320 g (weight 10th percentile for EGA)   - No acute chorioamnionitis identified   - Three vessel umbilical cord with narrow diameter (0.9 cm) and a furcate insertion  - Placental plate: accelerated maturation of chorionic villi,  increased intervillous and subchorionic fibrin, focal fetal thrombotic vasculopathy, small placental infarction (1.2 cm)        This electronic signature is attestation that the pathologist personally reviewed the submitted material(s) and the final diagnosis reflects that evaluation.       Communicable diseases  # TB - no longer needed; negative IGRA 2015 and low/no risk  # HCV - negative 10/2018; rescreen w/Ab q1-2y    Cancer screening  # Anorectal - not yet done  #  Colorectal - SCREEN AGE 87+  # Liver - not yet done  # Lung - not applicable    # Breast - SCREEN AGE 17+ -- Q1-2Y  # Cervical - neg cyto 03/2017  - needs repeat, referred to GYN.    Cardiovascular disease  # The ASCVD Risk score Denman George DC Jorge Ny al., 2013) failed to calculate.    Immunization History   Administered Date(s) Administered    Influenza Vaccine Quad (IIV4 PF) 55mo+ injectable 06/30/2015, 06/15/2016, 07/04/2018    MMR 03/31/2019    PNEUMOCOCCAL POLYSACCHARIDE 23 03/31/2019    TdaP 02/05/2019     Screening ordered today: none  Immunizations ordered today: none We discussed importance of accessing COVID-19 vaccine and discussed this recommendation extensively. Recommended flu shot as well. She is amenable to starting COVID vaccine series today.     I spent 25 minutes with the patient. I spent an additional 10 minutes on pre- and post-visit activities.   Counseled as documented above regarding mental health, COVID-19 vaccine, medication adherence, weight management and healthy lifestyle choices and need for recommended screening tests.    Disposition  Return to clinic 5-6 months or sooner if needed.    [name], MD, MPH   [facility]  [address]   [place], [place] [address]  Phone: [phone]   Fax: [phone]       Subjective:      Chief Complaint   HIV followup    HPI  Return patient visit for Morgan Hebert, a 35 y.o. woman with now well-controlled HIV.   Back together with her husband - things are going pretty well. Baby is doing great. Older daughter is as well, has custody of niece which has been rocky (older sister of niece who died).  Itchy lesions on abdomen, buttocks, under breasts - have been there for over a month. She has had these off and on for a while and has tried topical hydrocortisone and ketoconazole cream at different times. Has also tried fluocinonide ointment.     Smoking a few cigarettes a day - motivated to quit.    No COVID vaccine yet. Feels like she is very careful, not sure if she needs it.    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; linked variant not present; reduced carrier risk [x]  Hemoglobinopathy screening - normal adult hemoglobin  WGS study eligible: no  GENETIC COUNSELING PREVISIT SUMMARY Referring provider: [facility]  Indication: Aneuploidy screening Oth    Environmental allergies     GDM- diet controlled 03/07/2019    HIV (human immunodeficiency virus infection) (CMS-HCC)     HIV (human immunodeficiency virus infection) (CMS-HCC)     IIH (idiopathic intracranial hypertension)     IUD (intrauterine device) in place     Migraine     Obesity     Pseudotumor cerebri     Rubella non-immune status, antepartum 01/14/2019    [ ]  PP MMR    STD (sexually transmitted disease)     Supervision of high risk pregnancy, unspecified, unspecified trimester 05/30/2018    Dating: 9/21 (Unsure of LMP as had a D&C for a missed AB in 06/2018 and discovered  she was pregnant in 07/2018)                                    Prenatal Screening:  [x]  Prenatal labs reviewed [x]  SMA 2 copies/Hgb electropheresis wnl/CF [ ]   [x]  1 hr GTT= 250 but done 3 days after BMZ (4/22 and 4/23) --> Repeat 1 hr GTT  5-18= 229  [ ]  28 wk CBC/RPR [ ]  GBS   Fetal Screening:  [x]  aneuploidy scre    Threatened premature labor in third trimester 01/31/2019    Received BMZ 4/22 ([facility])-4/23 [facility]) for concern for PTL. SVE cl/th/hi.     Medications and Allergies   Reviewed and updated today. See bottom of this visit's encounter summary for details.  Current Outpatient Medications on File Prior to Visit   Medication Sig    acetaminophen (TYLENOL) 325 MG tablet Take 2 tablets (650 mg total) by mouth Every six (6) hours.    acetaZOLAMIDE (DIAMOX) 500 mg ER 12 hr capsule Take 1,500 mg by mouth Two (2) times a day.     bictegrav-emtricit-tenofov ala (BIKTARVY) 50-200-25 mg tablet Take 1 tablet by mouth daily.    fluocinonide (LIDEX) 0.05 % ointment Apply topically Two (2) times a day.    ibuprofen (MOTRIN) 600 MG tablet Take 1 tablet (600 mg total) by mouth Every six (6) hours.    ketoconazole (NIZORAL) 2 % cream  (Patient not taking: Reported on 06/24/2020)    ondansetron (ZOFRAN) 4 MG tablet TAKE ONE TABLET BY MOUTH EVERY 4 HOURS AS NEEDED FOR NAUSEA AND VOMITING (Patient not taking: Reported on 10/28/2020)    zonisamide (ZONEGRAN) 50 MG capsule Start at 50mg  daily; if well-tolerated after 2 weeks, increase to 100mg  daily     No current facility-administered medications on file prior to visit.     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     Social History  General - lives in [place] with her husband and her 2 daughters (born 2008, 2020). Also has custody of niece.   Doreene Adas and his girlfriend are there sometimes as well.   2021 - Family trauma, her 12yo niece committed suicide and her aunt passed away from cancer.   Mom and sister are both positive (sister is perinatally infected, patient with presumed sexual transmission from female partner).  Works at home for [employer], dreams of becoming a Clinical research associate or a Therapist, music. Loves vampire shows like True Blood.   Sexual History - sex with men   Substance Use - marijuana (smokes nightly), smokes maybe 3 tobacco cigarettes a day  Social History     Tobacco Use    Smoking status: Current Some Day Smoker     Types: Cigarettes    Smokeless tobacco: Never Used   Substance Use Topics    Alcohol use: Yes     Alcohol/week: 2.0 standard drinks     Types: 1 Shots of liquor, 1 Glasses of wine per week     Comment: rare     Review of Systems  As per HPI. Remainder of 10 systems reviewed, negative.      Objective:      BP 106/75 (BP Site: L Arm, BP Position: Sitting, BP Cuff Size: Medium)  - Pulse 99  - Temp 36.7 ??C (98.1 ??F) (Oral)  -  Ht 149.9 cm (4' 11)  - Wt 76.7 kg (169 lb)  - LMP 10/02/2020 (Exact Date)  - BMI 34.13 kg/m??   Wt Readings from Last 3 Encounters:   10/28/20 76.7 kg (169 lb)   06/25/20 74.4 kg (164 lb)   06/12/20 76 kg (167 lb 8.8 oz)     Const looks well and attentive, alert, appropriate   Eyes sclerae anicteric, noninjected OU   ENT TMs with good light reflexes bilaterally, clear fluid, all landmarks visible, no lesions in canal   Lymph no cervical or supraclavicular LAD   CV RRR. No murmurs. No rub or gallop. S1/S2.   Lungs CTAB ant/post, normal work of breathing   GI Soft, no organomegaly. NTND. NABS.   GU deferred   Rectal deferred   Skin Intertrigo with flaking under breasts. Few scattered nonindurated papules and macules on  abdomen, buttocks.   MSK no joint tenderness and normal ROM throughout. No LE edema, feet warm with brisk DP/PT pulses.   Neuro CN II-XII grossly intact, MAEE, non focal   Psych Appropriate affect. Eye contact good. Linear thoughts. Fluent speech.     Laboratory Data  Reviewed in Epic today, using Synopsis and Chart Review filters.  05/11/20 [facility] [name] (STAR study)  STI screen negative  RPR non reactive  HIV RNA <20  CD4 1620  A1C 5.4  Creat 0.86  TC 141 TRIG 120  HDL 38 LDL 81    Lab Results   Component Value Date    CREATININE 0.86 05/07/2020    QFTTBGOLD NEGATIVE 02/13/2014    HEPCAB Negative 06/12/2020    CHOL 141 05/07/2020    HDL 38 (L) 05/07/2020    LDL 81 05/07/2020    NONHDL 98 03/28/2018    TRIG 295 05/07/2020    A1C 5.4 05/07/2020    PAP Negative for intraephithelial lesion or malignancy 03/29/2017    FINALDX  03/29/2019     A: Placenta, delivery    - Preterm (36 weeks 1 days EGA) singleton third trimester placenta, weight 320 g (weight 10th percentile for EGA)   - No acute chorioamnionitis identified   - Three vessel umbilical cord with narrow diameter (0.9 cm) and a furcate insertion  - Placental plate: accelerated maturation of chorionic villi,  increased intervillous and subchorionic fibrin, focal fetal thrombotic vasculopathy, small placental infarction (1.2 cm)        This electronic signature is attestation that the pathologist personally reviewed the submitted material(s) and the final diagnosis reflects that evaluation.              _____________________________________________________________________

## 2020-10-28 NOTE — Unmapped (Addendum)
It was great to see you today.    ??? Residential households in the [place] can order one set of 4 free at-home COVID-19 tests. Go to [email or web address] to order if you have not already done so!  ??? [facility]: Pro Tip to Help Your Earloop Mask Fit More Tightly - YouTube     ?? The ID clinic phone number is [phone].  ?? The ID clinic fax number is [phone].    Please note that your laboratory and other results may be visible to you in real time, possibly before they reach your provider. Please allow 48 hours for clinical interpretation of these results. Importantly, even if a result is flagged as abnormal, it may not be one that impacts your health.    For urgent issues on nights and weekends you may reach the ID Physician on call through the [facility] Operator at [phone].     URGENT CARE  Please call ahead to speak with the nursing staff if you are in need of an urgent appointment.       MEDICATIONS  For refills please contact your pharmacy and ask them to electronically send or fax the request to the clinic.   Please bring all medications in original bottles to every appointment.    HMAP (formerly ADAP) or [employer] Eligibility (required even if you do not receive medication through [facility])  Please remember to renew your Juanell Fairly eligibility during renewal periods which occur twice a year: January-March and July-September.     The following are needed for each renewal:   - [facility] Identification (if you don't have one, then a bill with your name and address in [place])   - proof of income (award letter, W-2, or last three check stubs)   If you are unable to come in for renewal, let us know if we can mail, fax or e-mail paperwork to you.   HMAP Contact: [phone].     Neldon Mc, MD  [facility] at [facility]  [address]   [place], [place] [address]  Phone: [phone]   Fax: [phone]     Lab info:  Your most recent CD4 T-cell counts and viral loads are below. Here are a few things to keep in mind when looking at your numbers:  ?? Our goal is to get your virus to be undetectable and keep it undetectable. If the virus is undetectable you are much more likely to stay healthy.  ?? We consider your viral load to be undetectable if it says <40 or if it says Not detected.  ?? For most people, we're checking CD4 counts every other visit (once or twice a year, or sometimes even less).  ?? It's normal for your CD4 count to be different from visit to visit.   ?? You can help by taking your medications at about the same time, every single day. If you're having trouble with taking your medications, it's important to let us know.    Lab Results   Component Value Date    ACD4 1,118 10/22/2018    CD4 54 10/22/2018    HIVCP <20 06/24/2020    HIVRS Detected (A) 02/25/2019

## 2020-10-28 NOTE — Unmapped (Signed)
Name: Morgan Hebert  Date: 10/28/2020  Address: [address] [place] [address]   Phone: [phone]     Started assessment with patient options: in clinic     Is this the same address for mailing? Yes  If No, Mailing Address is:     Housing Scientist, water quality  Medicaid    Tax Filing Status  Head of Household    Employment Status  Employed Full Time    Income  Salary/Wages    If no or low income, how are you meeting your basic needs?  Food Stamps/EBT    List Tax Household Members including relationship to you:   n/a    Someone in my household receives: Not Applicable  Specify who: n/a    Do you have a current diagnosis for Hepatitis C?  Lab Results   Component Value Date    HEPCAB Negative 06/12/2020       Have you used tobacco products four or more times per week in the last six months?  Yes    Patient was informed of the following programs;   N/A    The following applications/handouts were given to patient:   N/A    The following forms were also started with the patient:   N/A    Federal Marketplace Eligibility Assessment  Patient is not eligible as they have insurance.     [employer] application status: Incomplete; patient needs to send last 2 paystubs     Patient is applying for [employer] on Charges Only     Additional Comments: n/a    Monia Pouch, CHES  ID Clinic Social Work Supervisor  Direct: [phone]  Main ID: [phone]

## 2020-10-28 NOTE — Unmapped (Signed)
Addended by: [name] on: 10/28/2020 11:02 AM     Modules accepted: Orders

## 2020-10-29 LAB — SYPHILIS SCREEN: SYPHILIS RPR SCREEN: NONREACTIVE

## 2020-10-30 LAB — HIV RNA, QUANTITATIVE, PCR: HIV RNA QNT RSLT: NOT DETECTED

## 2020-10-30 NOTE — Unmapped (Signed)
[facility] Refill Coordination Note    Specialty Medication(s) to be Shipped:   Infectious Disease: Biktarvy    Other medication(s) to be shipped: No additional medications requested for fill at this time     MYLAN LENGYEL, DOB: 1986/04/05  Phone: [phone] (home) [phone] (work)      All above HIPAA information was verified with patient.     Was a Nurse, learning disability used for this call? No    Completed refill call assessment today to schedule patient's medication shipment from the [facility] [phone]).       Specialty medication(s) and dose(s) confirmed: Regimen is correct and unchanged.   Changes to medications: Jayci reports no changes at this time.  Changes to insurance: No  Questions for the pharmacist: No    Confirmed patient received Welcome Packet with first shipment. The patient will receive a drug information handout for each medication shipped and additional FDA Medication Guides as required.       DISEASE/MEDICATION-SPECIFIC INFORMATION        N/A    SPECIALTY MEDICATION ADHERENCE     Medication Adherence    Patient reported X missed doses in the last month: 0  Specialty Medication: biktarvy          Unable to confirm quantity on hand      SHIPPING     Shipping address confirmed in Epic.     Delivery Scheduled: Yes, Expected medication delivery date: 1/27.     Medication will be delivered via Next Day Courier to the prescription address in Epic WAM.    [name]   [facility] Specialty Technician

## 2020-11-30 NOTE — Unmapped (Signed)
[facility] Refill Coordination Note    Specialty Medication(s) to be Shipped:   Infectious Disease: Biktarvy    Other medication(s) to be shipped: fluocinonide 0.05% ointment     Morgan Hebert, DOB: [birth year]  Phone: [phone] (home) [phone] (work)      All above HIPAA information was verified with patient.     Was a Nurse, learning disability used for this call? No    Completed refill call assessment today to schedule patient's medication shipment from the [facility] [phone]).       Specialty medication(s) and dose(s) confirmed: Regimen is correct and unchanged.   Changes to medications: Morgan Hebert reports no changes at this time.  Changes to insurance: No  Questions for the pharmacist: No    Confirmed patient received Welcome Packet with first shipment. The patient will receive a drug information handout for each medication shipped and additional FDA Medication Guides as required.       DISEASE/MEDICATION-SPECIFIC INFORMATION        N/A    SPECIALTY MEDICATION ADHERENCE     Medication Adherence    Patient reported X missed doses in the last month: 0  Specialty Medication: biktarvy            Unable to confirm quantity on hand      SHIPPING     Shipping address confirmed in Epic.     Delivery Scheduled: Yes, Expected medication delivery date: 2/24.     Medication will be delivered via Next Day Courier to the prescription address in Epic WAM.    Morgan Hebert   [facility] Specialty Technician

## 2020-12-02 MED FILL — FLUOCINONIDE 0.05 % TOPICAL OINTMENT: TOPICAL | 14 days supply | Qty: 30 | Fill #3

## 2020-12-02 MED FILL — BIKTARVY 50 MG-200 MG-25 MG TABLET: ORAL | 30 days supply | Qty: 30 | Fill #1

## 2020-12-04 NOTE — Unmapped (Signed)
Morgan Hebert is a 35 y.o. female here for a MWCCS Study Visit (IRB [phone]) on 12/04/2020. HIV positive participant. Denies complaints today. Pt is engaged in care at [facility], takes [place] for ARVs.      Negative COVID-19 Screen: Wellness screening and temperature check conducted at clinic door. Participant reported no COVID-19 symptoms, no contact to a known or suspected COVID-19 case in the last 14 days, and temperature was within normal limits. Participant was cleared to enter the facility.     Study visit included:    Pregnancy Test: not indicated    Physical Exam:  Blood Pressure, Height/Weight, In-Body Analysis , BIA Body Measurements  and Fibroscan     Gyn Exam:   No lesions, cysts, or nodules, detected in the external genitalia. Skin color consistent with race, no erythema or swelling noted. Labia majora are symmetrical, and well formed. Clitoris intact with good hygiene. Labia minora are dark pink, moist and symmetrical. Cervical opening is smooth, pink and even, no redness, inflammation or cyanosis detected. Cervix midline, Os is small and round without visible lesions. Vaginal wall is pink, deeply rugated, moist and smooth without inflammation or lesions. Bi-manual exam deferred and not part of study protocol.      GYN specimens collected: Thin prep pap w/reflex    Labs Collected: CMP, CBC w/diff, CD4, HIV Viral Load, fasting glucose, HgA1C, fasting lipids, RPR cascade. Urine: GC/CH  Pt accepted a stool collection kit.     All labs results will be documented in Epic.

## 2020-12-06 LAB — HIV-1, VIRAL LOAD DETERMINED BY PCR (87536): HIV-1 RNA BY PCR: <20 {copies}/mL

## 2020-12-08 LAB — T-LYMPHOCYTE HELPER/SUPPRESSOR
% CD 3 POS. LYMPH.: 86.1 % (ref 57.5–86.2)
ABSOLUTE CD 3: 2066 /uL (ref 622–2402)
ABSOLUTE CD8 CNT: 624 /uL (ref 109–897)
BANDED NEUTROPHILS ABSOLUTE COUNT: 0 10*3/uL (ref 0.0–0.1)
BASOPHILS ABSOLUTE COUNT: 0.1 10*3/uL (ref 0.0–0.2)
BASOPHILS RELATIVE PERCENT: 1 %
CD4 % HELPER T CELL: 59.4 % — ABNORMAL HIGH (ref 30.8–58.5)
CD4 T CELL ABSOLUTE: 1426 /uL (ref 359–1519)
CD4:CD8 RATIO: 2.28 (ref 0.92–3.72)
CD8 % SUPPRESSOR T CELL: 26 % (ref 12.0–35.5)
EOSINOPHILS ABSOLUTE COUNT: 0.3 10*3/uL (ref 0.0–0.4)
EOSINOPHILS RELATIVE PERCENT: 4 %
HEMATOCRIT: 38.4 % (ref 34.0–46.6)
HEMOGLOBIN: 12.6 g/dL (ref 11.1–15.9)
IMMATURE GRANULOCYTES: 0 %
LYMPHOCYTES ABSOLUTE COUNT: 2.4 10*3/uL (ref 0.7–3.1)
LYMPHOCYTES RELATIVE PERCENT: 38 %
MEAN CORPUSCULAR HEMOGLOBIN CONC: 32.8 g/dL (ref 31.5–35.7)
MEAN CORPUSCULAR HEMOGLOBIN: 28.6 pg (ref 26.6–33.0)
MEAN CORPUSCULAR VOLUME: 87 fL (ref 79–97)
MONOCYTES ABSOLUTE COUNT: 0.4 10*3/uL (ref 0.1–0.9)
MONOCYTES RELATIVE PERCENT: 6 %
NEUTROPHILS ABSOLUTE COUNT: 3.3 10*3/uL (ref 1.4–7.0)
NEUTROPHILS RELATIVE PERCENT: 51 %
PLATELET COUNT: 284 10*3/uL (ref 150–450)
RED BLOOD CELL COUNT: 4.41 x10E6/uL (ref 3.77–5.28)
RED CELL DISTRIBUTION WIDTH: 15.8 % — ABNORMAL HIGH (ref 11.7–15.4)
WHITE BLOOD CELL COUNT: 6.4 10*3/uL (ref 3.4–10.8)

## 2020-12-08 LAB — CMP14+1AC
A/G RATIO: 1.6 (ref 1.2–2.2)
ALBUMIN: 4.2 g/dL (ref 3.8–4.8)
ALKALINE PHOSPHATASE: 65 IU/L (ref 44–121)
ALT (SGPT): 13 IU/L (ref 0–32)
AST (SGOT): 14 IU/L (ref 0–40)
BILIRUBIN TOTAL: <0.2 mg/dL (ref 0.0–1.2)
BLOOD UREA NITROGEN: 9 mg/dL (ref 6–20)
BUN / CREAT RATIO: 10 (ref 9–23)
CALCIUM: 8.7 mg/dL (ref 8.7–10.2)
CHLORIDE: 104 mmol/L (ref 96–106)
CO2: 23 mmol/L (ref 20–29)
CREATININE: 0.89 mg/dL (ref 0.57–1.00)
GAMMA GLUTAMYL TRANSFERASE: 13 IU/L (ref 0–60)
GFR MDRD AF AMER: 98 mL/min/{1.73_m2}
GFR MDRD NON AF AMER: 85 mL/min/{1.73_m2}
GLOBULIN, TOTAL: 2.7 g/dL (ref 1.5–4.5)
GLUCOSE: 92 mg/dL (ref 65–99)
POTASSIUM: 3.9 mmol/L (ref 3.5–5.2)
SODIUM: 141 mmol/L (ref 134–144)
TOTAL PROTEIN: 6.9 g/dL (ref 6.0–8.5)

## 2020-12-08 LAB — HEMOGLOBIN A1C: HEMOGLOBIN A1C: 5.4 % (ref 4.8–5.6)

## 2020-12-08 LAB — LP+NON-HDL CHOLESTEROL
CHOLESTEROL, TOTAL: 140 mg/dL (ref 100–199)
HDL CHOLESTEROL: 50 mg/dL
LDL CHOLESTEROL CALCULATED: 73 mg/dL (ref 0–99)
NON-HDL CHOLESTEROL: 90 mg/dL (ref 0–129)
TRIGLYCERIDES: 93 mg/dL (ref 0–149)
VLDL CHOLESTEROL CAL: 17 mg/dL (ref 5–40)

## 2020-12-08 LAB — T PALLIDUM SCREENING CASCADE: T PALLIDUM ANTIBODIES (TP-PA): NONREACTIVE

## 2020-12-15 NOTE — Unmapped (Signed)
MWCCS Study labs received and reviewed:  CD4 1426 (59.4%); HIV Viral load <20 copies/mL  CBC, CMP, lipids, HA1c within range  RPR non reactive   Urine GC/CH negative  Pap smear: unsatisfactory for evaluation    Results available for viewing in Epic

## 2020-12-29 NOTE — Unmapped (Signed)
The [facility] has made a second and final attempt to reach this patient to refill the following medication: Biktarvy.      We have been unable to leave messages on the following phone numbers: [phone] and [phone] and have sent a MyChart message.    Dates contacted: 3/17, 3/22  Last scheduled delivery: shipped 2/23    The patient may be at risk of non-compliance with this medication. The patient should call the [facility] at [phone] (option 4) to refill medication.    [name]   [facility] Specialty Technician

## 2021-01-13 NOTE — Unmapped (Unsigned)
PCP:  Jacquiline Doe, MD    HPI:  Morgan Hebert is a 35 y.o. [id number].  No LMP recorded.  She presents today for a pap smear.  She had one on 12/04/20, but it was unsatisfactory.  She is HIV +, but well controlled on oral antivirals.         ROS:  {Ros - complete:30496}    GYNHx:  Menses:  {Frequencies; period menses:715}  Last Pap: 2018, neg; 11/2020 was Unsat  Hx of STI's:  {STD'S:25423}  Last MMG:  ***  Sexually active:  ***.   Partners are {Blank single:19197::female,female,both}  Number of life-time female partners:  ***  Contraception:  BTL, 06/2019    HEALTH MAINTENANCE:  1.  Colonoscopy:  n/a  2.  DXA scan:  n/a  3.  Labs:    Cholesterol, Total   Date/Time Value Ref Range Status   12/04/2020 09:35 AM 140 100 - 199 mg/dL Final     HDL   Date/Time Value Ref Range Status   12/04/2020 09:35 AM 50 >39 mg/dL Final     LDL Calculated   Date/Time Value Ref Range Status   12/04/2020 09:35 AM 73 0 - 99 mg/dL Final     Triglycerides   Date/Time Value Ref Range Status   12/04/2020 09:35 AM 93 0 - 149 mg/dL Final     Hemoglobin V4U   Date/Time Value Ref Range Status   12/04/2020 09:35 AM 5.4 4.8 - 5.6 % Final     Comment:              Prediabetes: 5.7 - 6.4           Diabetes: >6.4           Glycemic control for adults with diabetes: <7.0       TSH   Date/Time Value Ref Range Status   07/04/2018 02:06 PM 2.190 0.600 - 3.300 uIU/mL Final     4.  Exercise:  ***  5.  SBE:  {yes no:314532}  6.  Immunizations:    Immunization History   Administered Date(s) Administered   ??? COVID-19 VACC,MRNA,(PFIZER)(PF)(IM) 10/28/2020   ??? Influenza Vaccine Quad (IIV4 PF) 39mo+ injectable 06/30/2015, 06/15/2016, 07/04/2018, 10/28/2020   ??? MMR 03/31/2019   ??? PNEUMOCOCCAL POLYSACCHARIDE 23 03/31/2019   ??? TdaP 02/05/2019     ***      OB History   Gravida Para Term Preterm AB Living   11 2 1 1 9 2    SAB IAB Ectopic Molar Multiple Live Births   8   1   0 2      # Outcome Date GA Lbr Len/2nd Weight Sex Delivery Anes PTL Lv   11 Preterm 03/29/19 [redacted]w[redacted]d 1970 g (4 lb 5.5 oz) F CS-LTranv EPI, Spinal Y LIV      Complications: Fetal Intolerance   10 SAB 06/2018           9 SAB 2013           8 Ectopic 2013           7 SAB 2012           6 SAB 2012           5 SAB 2011           4 SAB 2010           3 SAB 2010           2 Term 06/28/06   2693  g (5 lb 15 oz) F Vag-Spont  N LIV   1 SAB               Obstetric Comments   OB-History reviewed by Lucilla Lame RN on 04/02/2019.       Past Medical History:   Diagnosis Date   ??? Abnormal mammogram    ??? Constipation    ??? Diarrhea    ??? Encounter for procreative genetic counseling 10/24/2018    Genetic counseling visit on 11/01/2018 Aneuploidy screening/ testing:  Genetic counseling visit pending Carrier screening:  [x]  Spinal muscular atrophy - 2 copies SMN1; linked variant not present; reduced carrier risk [x]  Hemoglobinopathy screening - normal adult hemoglobin  WGS study eligible: no  GENETIC COUNSELING PREVISIT SUMMARY Referring provider: [facility]  Indication: Aneuploidy screening Oth   ??? Environmental allergies    ??? GDM- diet controlled 03/07/2019   ??? HIV (human immunodeficiency virus infection) (CMS-HCC)    ??? HIV (human immunodeficiency virus infection) (CMS-HCC)    ??? IIH (idiopathic intracranial hypertension)    ??? IUD (intrauterine device) in place    ??? Migraine    ??? Obesity    ??? Pseudotumor cerebri    ??? Rubella non-immune status, antepartum 01/14/2019    [ ]  PP MMR   ??? STD (sexually transmitted disease)    ??? Supervision of high risk pregnancy, unspecified, unspecified trimester 05/30/2018    Dating: 9/21 (Unsure of LMP as had a D&C for a missed AB in 06/2018 and discovered she was pregnant in 07/2018)                                    Prenatal Screening:  [x]  Prenatal labs reviewed [x]  SMA 2 copies/Hgb electropheresis wnl/CF [ ]   [x]  1 hr GTT= 250 but done 3 days after BMZ (4/22 and 4/23) --> Repeat 1 hr GTT  5-18= 229  [ ]  28 wk CBC/RPR [ ]  GBS   Fetal Screening:  [x]  aneuploidy scre   ??? Threatened premature labor in third trimester 01/31/2019    Received BMZ 4/22 ([place])-4/23 [facility]) for concern for PTL. SVE cl/th/hi.       Past Surgical History:   Procedure Laterality Date   ??? DILATION AND CURETTAGE OF UTERUS     ??? LUMBAR PUNCTURE     ??? PR CESAREAN DELIVERY ONLY N/A 03/29/2019    Procedure: CESAREAN DELIVERY ONLY;  Surgeon: Asher Muir, MD;  Location: L&D C-SECTION OR SUITES [facility];  Service: Maternal-Fetal Medicine   ??? PR COLONOSCOPY W/BIOPSY SINGLE/MULTIPLE  07/03/2014    Procedure: COLONOSCOPY, FLEXIBLE, PROXIMAL TO SPLENIC FLEXURE; WITH BIOPSY, SINGLE OR MULTIPLE;  Surgeon: Billie Ruddy, MD;  Location: [facility] PROCEDURES [facility];  Service: Gastroenterology   ??? PR DILATION/CURETTAGE,DIAGNOSTIC N/A 06/14/2018    Procedure: DILATION AND CURETTAGE, DIAGNOSTIC AND/OR THERAPEUTIC (NON OBSTETRICAL);  Surgeon: Nelle Don, MD;  [facility]: [facility] OR [facility];  Service: [facility]necology   ??? PR LAP,RMV  ADNEXAL STRUCTURE Bilateral 06/04/2019    Procedure: R21  LAPAROSCOPY, SURGICAL; W/REMOVAL OF ADNEXAL STRUCTURES (PARTIAL OR TOTAL OOPHORECTOMY &/OR SALPINGECTOMY);  Surgeon: Joneen Caraway, MD;  Location: [facility];  Service: Family Planning   ??? PR SIGMOIDOSCOPY FLX DX W/COLLJ SPEC BR/WA IF PFRMD N/A 06/25/2020    Procedure: SIGMOIDOSCOPY, FLEXIBLE; DIAGNOSTIC, WITH OR WITHOUT COLLECTION OF SPECIMEN(S) BY BRUSHING OR WASHING;  Surgeon: Bronson Curb, MD;  Location: [facility] PROCEDURES [facility];  Service: Gastroenterology   ??? PR UPPER GI ENDOSCOPY,BIOPSY N/A 07/03/2014    Procedure: UGI ENDOSCOPY; WITH BIOPSY, SINGLE OR MULTIPLE;  Surgeon: Billie Ruddy, MD;  Location: [facility] PROCEDURES [facility];  Service: Gastroenterology   ??? WISDOM TOOTH EXTRACTION         SOCIAL HISTORY:  Social History     Tobacco Use   Smoking Status Current Some Day Smoker   ??? Types: Cigarettes   Smokeless Tobacco Never Used     Social History     Substance and Sexual Activity   Alcohol Use Yes   ??? Alcohol/week: 2.0 standard drinks   ??? Types: 1 Shots of liquor, 1 Glasses of wine per week    Comment: rare     Social History     Substance and Sexual Activity   Drug Use Yes   ??? Frequency: 7.0 times per week   ??? Types: Marijuana    Comment:  for migraines       Occupation:  ***  Personal Information:  ***    Family History   Problem Relation Age of Onset   ??? Cervical cancer Mother    ??? Diabetes Mother    ??? Asthma Mother    ??? Hypertension Mother    ??? Diabetes Maternal Aunt    ??? Hypertension Maternal Aunt    ??? Breast cancer Maternal Grandmother    ??? Cancer Maternal Grandmother    ??? No Known Problems Daughter    ??? Glaucoma Neg Hx        ALLERGIES:  Peanut    Current Outpatient Medications   Medication Sig Dispense Refill   ??? acetaminophen (TYLENOL) 325 MG tablet Take 2 tablets (650 mg total) by mouth Every six (6) hours. 120 tablet 0   ??? acetaZOLAMIDE (DIAMOX) 500 mg ER 12 hr capsule Take 1,500 mg by mouth Two (2) times a day.      ??? bictegrav-emtricit-tenofov ala (BIKTARVY) 50-200-25 mg tablet Take 1 tablet by mouth daily. 30 tablet 11   ??? fluocinonide (LIDEX) 0.05 % ointment Apply topically Two (2) times a day. 30 g 5   ??? ibuprofen (MOTRIN) 600 MG tablet Take 1 tablet (600 mg total) by mouth Every six (6) hours. 60 tablet 0   ??? ketoconazole (NIZORAL) 2 % cream  (Patient not taking: Reported on 06/24/2020)     ??? ondansetron (ZOFRAN) 4 MG tablet TAKE ONE TABLET BY MOUTH EVERY 4 HOURS AS NEEDED FOR NAUSEA AND VOMITING (Patient not taking: Reported on 10/28/2020) 30 tablet 0   ??? zonisamide (ZONEGRAN) 50 MG capsule Start at 50mg  daily; if well-tolerated after 2 weeks, increase to 100mg  daily 90 capsule 3     No current facility-administered medications for this visit.       PE:  There were no vitals taken for this visit.  GEN: WD, WN, NAD.  A+ O x 3, good mood and affect.  HEENT:  No thyroidmegally, no nodules or tenderness.  MMM  CHEST:  CTA bilat  CV: RRR  BREAST:  No masses.  No lymphadenopathy in axilla.  No nipple discharge, retractions or dimpling  ABD:  NT, ND.  Soft, no masses.  No hepatosplenomegaly.  No hernias noted.  PELVIC:  Normal EG.  No urethral lesions or discharge.  No urethral or bladder tenderness.  Vaginal rugae is {Blank single:19197::well estrogenized,minimally estrogenized,atrophic}.  Cervix is {Blank single:19197::parous,nulliparous,absent}, NT and without lesions or discharge.  Uterus is {Blank single:19197::AV,RV,absent},  mobile, NT, {Blank single:19197::non-gravid,enlarged}.  No adnexal masses or tenderness.  Anus and perineum are normal.  RV exam:  {Blank single:19197::good sphinchter tone, no uterosacral nodules or posterior cul-de-sac masses,deferred}  EXTR:  No c/c/e  NEURO:  CN 2-12 grossly intact, DTR's + *** bilaterally, normal gait        ASSESSMENT:  Morgan Hebert is a 35 y.o. [id number] here for No diagnosis found.      PLAN:  1.  ***    I spent *** minutes in face-to-face communication with [name], greater than 50% of which was in counseling and education.

## 2021-01-14 ENCOUNTER — Ambulatory Visit
Admit: 2021-01-14 | Payer: BLUE CROSS/BLUE SHIELD | Attending: Obstetrics & Gynecology | Primary: Obstetrics & Gynecology

## 2021-01-19 NOTE — Unmapped (Signed)
[facility] Refill Coordination Note    Specialty Medication(s) to be Shipped:   Infectious Disease: Biktarvy    Other medication(s) to be shipped: No additional medications requested for fill at this time     Morgan Hebert, DOB: [birth year]  Phone: [phone] (home) [phone] (work)      All above HIPAA information was verified with patient.     Was a Nurse, learning disability used for this call? No    Completed refill call assessment today to schedule patient's medication shipment from the [facility] [phone]).  All relevant notes have been reviewed.     Specialty medication(s) and dose(s) confirmed: Regimen is correct and unchanged.   Changes to medications: Elvira reports no changes at this time.  Changes to insurance: No  New side effects reported not previously addressed with a pharmacist or physician: None reported  Questions for the pharmacist: No    Confirmed patient received a Conservation officer, historic buildings and a Surveyor, mining with first shipment. The patient will receive a drug information handout for each medication shipped and additional FDA Medication Guides as required.       DISEASE/MEDICATION-SPECIFIC INFORMATION        N/A    SPECIALTY MEDICATION ADHERENCE     Medication Adherence    Patient reported X missed doses in the last month: 1  Specialty Medication: BIKTARVY 50-200-25 MG  Patient is on additional specialty medications: No  Informant: patient  Confirmed plan for next specialty medication refill: delivery by pharmacy  Refills needed for supportive medications: not needed          Refill Coordination    Has the Patients' Contact Information Changed: No  Is the Shipping Address Different: No         Were doses missed due to medication being on hold? NO MISSED DOSES DUE TO BEING OUT AS OF YESTERDAY    BIKTARVY 50-200-25 mg: 0 days of medicine on hand       REFERRAL TO PHARMACIST     Referral to the pharmacist: Not needed      SHIPPING     Shipping address confirmed in Epic.     Delivery Scheduled: Yes, Expected medication delivery date: 4/13.     Medication will be delivered via Same Day Courier to the prescription address in Epic WAM.    Jolene Schimke   [facility] Specialty Technician

## 2021-01-20 MED FILL — BIKTARVY 50 MG-200 MG-25 MG TABLET: ORAL | 30 days supply | Qty: 30 | Fill #2

## 2021-02-09 NOTE — Unmapped (Signed)
[facility] Clinical Assessment & Refill Coordination Note    Morgan Hebert, DOB: 11/15/1985  Phone: [phone] (home) [phone] (work)    All above HIPAA information was verified with patient.     Was a Nurse, learning disability used for this call? No    Specialty Medication(s):   Infectious Disease: Biktarvy     Current Outpatient Medications   Medication Sig Dispense Refill   ??? acetaminophen (TYLENOL) 325 MG tablet Take 2 tablets (650 mg total) by mouth Every six (6) hours. 120 tablet 0   ??? acetaZOLAMIDE (DIAMOX) 500 mg ER 12 hr capsule Take 1,500 mg by mouth Two (2) times a day.      ??? bictegrav-emtricit-tenofov ala (BIKTARVY) 50-200-25 mg tablet Take 1 tablet by mouth daily. 30 tablet 11   ??? fluocinonide (LIDEX) 0.05 % ointment Apply topically Two (2) times a day. 30 g 5   ??? ibuprofen (MOTRIN) 600 MG tablet Take 1 tablet (600 mg total) by mouth Every six (6) hours. 60 tablet 0   ??? ketoconazole (NIZORAL) 2 % cream  (Patient not taking: Reported on 06/24/2020)     ??? ondansetron (ZOFRAN) 4 MG tablet TAKE ONE TABLET BY MOUTH EVERY 4 HOURS AS NEEDED FOR NAUSEA AND VOMITING (Patient not taking: Reported on 10/28/2020) 30 tablet 0   ??? zonisamide (ZONEGRAN) 50 MG capsule Start at 50mg  daily; if well-tolerated after 2 weeks, increase to 100mg  daily 90 capsule 3     No current facility-administered medications for this visit.        Changes to medications: Morgan Hebert reports no changes at this time.    Allergies   Allergen Reactions   ??? 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       Changes to allergies: No    SPECIALTY MEDICATION ADHERENCE     Biktarvy 50-200-25 mg: approximately 10 days of medicine on hand       Medication Adherence    Patient reported X missed doses in the last month: 0  Specialty Medication: Biktarvy  Patient is on additional specialty medications: No  Any gaps in refill history greater than 2 weeks in the last 3 months: no  Demonstrates understanding of importance of adherence: yes  Informant: patient  Provider-estimated medication adherence level: good  Patient is at risk for Non-Adherence: No          Specialty medication(s) dose(s) confirmed: Regimen is correct and unchanged.     Are there any concerns with adherence? No    Adherence counseling provided? Not needed    CLINICAL MANAGEMENT AND INTERVENTION      Clinical Benefit Assessment:    Do you feel the medicine is effective or helping your condition? Yes    HIV ASSOCIATED LABS:     Lab Results   Component Value Date/Time    HIVRS Not Detected 10/28/2020 10:17 AM    HIVRS Detected (A) 02/25/2019 10:12 AM    HIVRS Not Detected 09/26/2018 11:07 AM    HIVRS 42,400 10/23/2017 12:00 AM    HIVRS not detected 03/29/2017 12:00 AM    HIVRS detected 10/25/2016 12:00 AM    HIVCP <20 06/24/2020 08:23 AM    HIVCP <20 06/12/2020 11:43 AM    HIVCP <40 (H) 02/25/2019 10:12 AM    HIVCP <40 (H) 07/04/2018 02:06 PM    HIVCP detected 10/23/2017 12:00 AM    RCD4 54.4 05/01/2015 12:00 AM  RCD4 53.7 10/24/2014 12:00 AM    ACD4 1,118 10/22/2018 09:45 AM    ACD4 1,696 (A) 05/01/2015 12:00 AM    ACD4 967 10/24/2014 12:00 AM    ACD4 854 06/23/2014 12:26 PM       Clinical Benefit counseling provided? Labs from 10/28/20 show evidence of clinical benefit    Adverse Effects Assessment:    Are you experiencing any side effects? No    Are you experiencing difficulty administering your medicine? No    Quality of Life Assessment:    How many days over the past month did your HIV  keep you from your normal activities? For example, brushing your teeth or getting up in the morning. 0    Have you discussed this with your provider? Not needed    Acute Infection Status:    Acute infections noted within Epic:  No active infections  Patient reported infection: None    Therapy Appropriateness:    Is therapy appropriate? Yes, therapy is appropriate and should be continued    DISEASE/MEDICATION-SPECIFIC INFORMATION N/A    PATIENT SPECIFIC NEEDS     - Does the patient have any physical, cognitive, or cultural barriers? No    - Is the patient high risk? No    - Does the patient require a Care Management Plan? No     - Does the patient require physician intervention or other additional services (i.e. nutrition, smoking cessation, social work)? No      SHIPPING     Specialty Medication(s) to be Shipped:   Infectious Disease: Biktarvy    Other medication(s) to be shipped: No additional medications requested for fill at this time     Changes to insurance: No    Delivery Scheduled: Yes, Expected medication delivery date: 02/12/21.     Medication will be delivered via Same Day Courier to the confirmed prescription address in [facility].    The patient will receive a drug information handout for each medication shipped and additional FDA Medication Guides as required.  Verified that patient has previously received a Conservation officer, historic buildings and a Surveyor, mining.    All of the patient's questions and concerns have been addressed.    Roderic Palau   [facility] Specialty Pharmacist

## 2021-02-12 MED FILL — BIKTARVY 50 MG-200 MG-25 MG TABLET: ORAL | 30 days supply | Qty: 30 | Fill #3

## 2021-03-15 NOTE — Unmapped (Signed)
[facility] Refill Coordination Note    Specialty Medication(s) to be Shipped:   Infectious Disease: Biktarvy    Other medication(s) to be shipped: fluocinonide oint     Morgan Hebert, DOB: 07/04/1986  Phone: [phone] (home) [phone] (work)      All above HIPAA information was verified with patient.     Was a Nurse, learning disability used for this call? No    Completed refill call assessment today to schedule patient's medication shipment from the [facility] [phone]).  All relevant notes have been reviewed.     Specialty medication(s) and dose(s) confirmed: Regimen is correct and unchanged.   Changes to medications: Morgan Hebert reports no changes at this time.  Changes to insurance: No  New side effects reported not previously addressed with a pharmacist or physician: None reported  Questions for the pharmacist: No    Confirmed patient received a Conservation officer, historic buildings and a Surveyor, mining with first shipment. The patient will receive a drug information handout for each medication shipped and additional FDA Medication Guides as required.       DISEASE/MEDICATION-SPECIFIC INFORMATION        N/A    SPECIALTY MEDICATION ADHERENCE     Medication Adherence    Patient reported X missed doses in the last month: 0  Specialty Medication: biktarvy 50-200-25mg   Patient is on additional specialty medications: No  Patient is on more than two specialty medications: No              Were doses missed due to medication being on hold? No    biktarvy  : 5 days of medicine on hand       REFERRAL TO PHARMACIST     Referral to the pharmacist: Not needed      [facility]     Shipping address confirmed in Epic.     Delivery Scheduled: Yes, Expected medication delivery date: 6/8.     Medication will be delivered via Next Day Courier to the prescription address in Epic WAM.    Morgan Hebert   [facility] Specialty Technician

## 2021-03-16 MED FILL — BIKTARVY 50 MG-200 MG-25 MG TABLET: ORAL | 30 days supply | Qty: 30 | Fill #4

## 2021-03-16 MED FILL — FLUOCINONIDE 0.05 % TOPICAL OINTMENT: TOPICAL | 14 days supply | Qty: 30 | Fill #4

## 2021-03-31 ENCOUNTER — Ambulatory Visit: Admit: 2021-03-31 | Payer: BLUE CROSS/BLUE SHIELD

## 2021-03-31 DIAGNOSIS — B20 Human immunodeficiency virus [HIV] disease: Principal | ICD-10-CM

## 2021-03-31 NOTE — Unmapped (Unsigned)
INFECTIOUS DISEASES CLINIC  [address]  [place], [place]  [address]  P [phone]  F [phone]     Primary care provider: Jacquiline Doe, MD    Assessment/Plan:      Morgan Hebert, a 35 y.o. female called today for routine HIV followup.    Plan:  HIV  Fills ART via private insurance. RW renewal needed for this period.  ?? Continue current therapy. E-prescribed today. She uses [facility].  ?? No labs today  - gets labs via research.  ?? Encouraged continued excellent ARV adherence.   Lab Results   Component Value Date    ACD4 1,118 10/22/2018    CD4 54 10/22/2018    HIVCP <20 06/24/2020    HIVRS Not Detected 10/28/2020   12/04/20 [facility] (STAR study)  HIV RNA <20  CD4 1426     Mental Health  ?? Provided support, in therapy locally.  ?? Overall doing well and feels safe.     Pseudotumor cerebri  ?? Working with neurologists, symptoms are stable.  ?? For ophtho followup.    Skin lesions  ?? I had initially suspected fungal superinfection or seborrhea when these were more diffuse. I am not sure if these are eczematous. They are fairly dry and macular. She has tried steroids and antifungals and requests dermatology referral. Provided referral to [facility].    Sexual health & secondary prevention  Sex with men. Monogamous with single partner. She does disclose status. Never uses condoms. Has BTL.    Lab Results   Component Value Date    RPR Nonreactive 10/28/2020    LABRPR Nonreactive 06/12/2020    CTNAA Negative 12/04/2020    CTNAA Negative 10/28/2020    CTNAA Negative 10/28/2020    CTNAA Negative 10/28/2020    GCNAA Negative 12/04/2020    GCNAA Negative 10/28/2020    GCNAA Negative 10/28/2020    GCNAA Negative 10/28/2020    SPECTYPE Urine 10/28/2020    SPECTYPE Swab 10/28/2020    SPECTYPE Swab 10/28/2020    SPECSOURCE Urine 10/28/2020    SPECSOURCE Throat 10/28/2020    SPECSOURCE Rectum 10/28/2020     ?? GC/CT NAATs -- Negative vaginal swab 12/04/20, negative 3 site testing 10/2020  ?? RPR -- NR 11/2020 - repeat 1Y      Health maintenance  Lab Results   Component Value Date    CREATININE 0.89 12/04/2020    QFTTBGOLD NEGATIVE 02/13/2014    HEPCAB Nonreactive 10/28/2020    CHOL 140 12/04/2020    HDL 50 12/04/2020    LDL 73 12/04/2020    NONHDL 90 12/04/2020    TRIG 93 12/04/2020    A1C 5.4 12/04/2020    PAP Negative for intraephithelial lesion or malignancy 03/29/2017    FINALDX  03/29/2019     A: Placenta, delivery    - Preterm (36 weeks 1 days EGA) singleton third trimester placenta, weight 320 g (weight 10th percentile for EGA)   - No acute chorioamnionitis identified   - Three vessel umbilical cord with narrow diameter (0.9 cm) and a furcate insertion  - Placental plate: accelerated maturation of chorionic villi,  increased intervillous and subchorionic fibrin, focal fetal thrombotic vasculopathy, small placental infarction (1.2 cm)        This electronic signature is attestation that the pathologist personally reviewed the submitted material(s) and the final diagnosis reflects that evaluation.       Communicable diseases  # TB - no longer needed; negative  IGRA 2015 and low/no risk  # HCV - negative 10/2018; rescreen w/Ab q1-2y    Cancer screening  # Anorectal - not yet done  # Colorectal - SCREEN AGE 78+  # Liver - not yet done  # Lung - not applicable    # Breast - SCREEN AGE 65+ -- Q1-2Y  # Cervical - neg cyto 03/2017  - needs repeat, referred to GYN.    Cardiovascular disease  # The ASCVD Risk score Morgan Hebert) failed to calculate.    Immunization History   Administered Date(s) Administered   ??? COVID-19 VACC,MRNA,(PFIZER)(PF)(IM) 10/28/2020   ??? Influenza Vaccine Quad (IIV4 PF) 42mo+ injectable 06/30/2015, 06/15/2016, 07/04/2018, 10/28/2020   ??? MMR 03/31/2019   ??? PNEUMOCOCCAL POLYSACCHARIDE 23 03/31/2019   ??? TdaP 02/05/2019     ?? Screening ordered today: none  ?? Immunizations ordered today: none We discussed importance of accessing COVID-19 vaccine and discussed this recommendation extensively. Recommended flu shot as well. She is amenable to starting COVID vaccine series today.     I spent 25 minutes with the patient. I spent an additional 10 minutes on pre- and post-visit activities.   Counseled as documented above regarding mental health, COVID-19 vaccine, medication adherence, weight management and healthy lifestyle choices and need for recommended screening tests.    Disposition  Return to clinic 5-6 months or sooner if needed.    Morgan Bristol, MD, MPH   [facility] Infect[facility] Diseases [facility] at [facility]  [address]   [place], [place] [address]  Phone: [phone]   Fax: [phone]       Subjective:      Chief Complaint   HIV followup    HPI  Return patient visit for Morgan Hebert, a 35 y.o. woman with now well-controlled HIV.   Back together with her husband - things are going pretty well. Baby is doing great. Older daughter is as well, has custody of niece which has been rocky (older sister of niece who died).  Itchy lesions on abdomen, buttocks, under breasts - have been there for over a month. She has had these off and on for a while and has tried topical hydrocortisone and ketoconazole cream at different times. Has also tried fluocinonide ointment.     Smoking a few cigarettes a day - motivated to quit.    No COVID vaccine yet. Feels like she is very careful, not sure if she needs it.    Past Medical History:   Diagnosis Date   ??? Abnormal mammogram    ??? Constipation    ??? Diarrhea    ??? 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 Environmental allergies    ??? GDM- diet controlled 03/07/2019 ??? HIV (human immunodeficiency virus infection) (CMS-HCC)    ??? HIV (human immunodeficiency virus infection) (CMS-HCC)    ??? IIH (idiopathic intracranial hypertension)    ??? IUD (intrauterine device) in place    ??? Migraine    ??? Obesity    ??? Pseudotumor cerebri    ??? Rubella non-immune status, antepartum 01/14/2019    [ ]  PP MMR   ??? STD (sexually  transmitted disease)    ??? Supervision of high risk pregnancy, unspecified, unspecified trimester 05/30/2018    Dating: 9/21 (Unsure of LMP as had a D&C for a missed AB in 06/2018 and discovered she was pregnant in 07/2018)                                    Prenatal Screening:  [x]  Prenatal labs reviewed [x]  SMA 2 copies/Hgb electropheresis wnl/CF [ ]   [x]  1 hr GTT= 250 but done 3 days after BMZ (4/22 and 4/23) --> Repeat 1 hr GTT  5-18= 229  [ ]  28 wk CBC/RPR [ ]  GBS   Fetal Screening:  [x]  aneuploidy scre   ??? Threatened premature labor in third trimester 01/31/2019    Received BMZ 4/22 ([place])-4/23 [facility]) for concern for PTL. SVE cl/th/hi.     Medications and Allergies   Reviewed and updated today. See bottom of this visit's encounter summary for details.  Current Outpatient Medications on File Prior to Visit   Medication Sig   ??? acetaminophen (TYLENOL) 325 MG tablet Take 2 tablets (650 mg total) by mouth Every six (6) hours.   ??? acetaZOLAMIDE (DIAMOX) 500 mg ER 12 hr capsule Take 1,500 mg by mouth Two (2) times a day.    ??? bictegrav-emtricit-tenofov ala (BIKTARVY) 50-200-25 mg tablet Take 1 tablet by mouth daily.   ??? fluocinonide (LIDEX) 0.05 % ointment Apply topically Two (2) times a day.   ??? ibuprofen (MOTRIN) 600 MG tablet Take 1 tablet (600 mg total) by mouth Every six (6) hours.   ??? ketoconazole (NIZORAL) 2 % cream  (Patient not taking: Reported on 06/24/2020)   ??? ondansetron (ZOFRAN) 4 MG tablet TAKE ONE TABLET BY MOUTH EVERY 4 HOURS AS NEEDED FOR NAUSEA AND VOMITING (Patient not taking: Reported on 10/28/2020)   ??? zonisamide (ZONEGRAN) 50 MG capsule Start at 50mg  daily; if well-tolerated after 2 weeks, increase to 100mg  daily     No current facility-administered medications on file prior to visit.     Allergies   Allergen Reactions   ??? 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     Social History  General - lives in [place] with her husband and her 2 daughters (born 2008, 2020). Also has custody of niece.   ?? Doreene Adas and his girlfriend are there sometimes as well.   ?? 2021 - Family trauma, her 12yo niece committed suicide and her aunt passed away from cancer.   ?? Mom and sister are both positive (sister is perinatally infected, patient with presumed sexual transmission from female partner).  ?? Works at home for [employer], dreams of becoming a Clinical research associate or a Therapist, music. Loves vampire shows like True Blood.   Sexual History - sex with men   Substance Use - marijuana (smokes nightly), smokes maybe 3 tobacco cigarettes a day  Social History     Tobacco Use   ??? Smoking status: Current Some Day Smoker     Types: Cigarettes   ??? Smokeless tobacco: Never Used   Substance Use Topics   ??? Alcohol use: Yes     Alcohol/week: 2.0 standard drinks     Types: 1 Shots of liquor, 1 Glasses of wine per week     Comment: rare     Review of Systems  As per HPI. Remainder of 10 systems reviewed,  negative.      Objective:      There were no vitals taken for this visit.  Wt Readings from Last 3 Encounters:   10/28/20 76.7 kg (169 lb)   06/25/20 74.4 kg (164 lb)   06/12/20 76 kg (167 lb 8.8 oz)     Const looks well and attentive, alert, appropriate   Eyes sclerae anicteric, noninjected OU   ENT TMs with good light reflexes bilaterally, clear fluid, all landmarks visible, no lesions in canal   Lymph no cervical or supraclavicular LAD   CV RRR. No murmurs. No rub or gallop. S1/S2.   Lungs CTAB ant/post, normal work of breathing   GI Soft, no organomegaly. NTND. NABS.   GU deferred   Rectal deferred   Skin Intertrigo with flaking under breasts. Few scattered nonindurated papules and macules on  abdomen, buttocks.   MSK no joint tenderness and normal ROM throughout. No LE edema, feet warm with brisk DP/PT pulses.   Neuro CN II-XII grossly intact, MAEE, non focal   Psych Appropriate affect. Eye contact good. Linear thoughts. Fluent speech.     Laboratory Data  Reviewed in Epic today, using Synopsis and Chart Review filters.  05/11/20 Lab [name] (STAR study)  STI screen negative  RPR non reactive  HIV RNA <20  CD4 1620  A1C 5.4  Creat 0.86  TC 141 TRIG 120  HDL 38 LDL 81    Lab Results   Component Value Date    CREATININE 0.89 12/04/2020    QFTTBGOLD NEGATIVE 02/13/2014    HEPCAB Nonreactive 10/28/2020    CHOL 140 12/04/2020    HDL 50 12/04/2020    LDL 73 12/04/2020    NONHDL 90 12/04/2020    TRIG 93 12/04/2020    A1C 5.4 12/04/2020    PAP Negative for intraephithelial lesion or malignancy 03/29/2017    FINALDX  03/29/2019     A: Placenta, delivery    - Preterm (36 weeks 1 days EGA) singleton third trimester placenta, weight 320 g (weight 10th percentile for EGA)   - No acute chorioamnionitis identified   - Three vessel umbilical cord with narrow diameter (0.9 cm) and a furcate insertion  - Placental plate: accelerated maturation of chorionic villi,  increased intervillous and subchorionic fibrin, focal fetal thrombotic vasculopathy, small placental infarction (1.2 cm)        This electronic signature is attestation that the pathologist personally reviewed the submitted material(s) and the final diagnosis reflects that evaluation.              _____________________________________________________________________

## 2021-04-01 NOTE — Unmapped (Signed)
Called patient as she missed appt today. She said that she overslept - things have been busy with appts for both girls. She will reschedule, requested call from our [facility] team. Stated taking ART as prescribed.    Morgan Bristol, MD, MPH   [facility] Infectious Diseases Clinic at [facility]  [address]   [place], [place] [address]  Phone: [phone]   Fax: [phone]

## 2021-04-01 NOTE — Unmapped (Signed)
Patient reschedule appointment with Dr. Rosemarie Beath

## 2021-04-19 NOTE — Unmapped (Signed)
[facility] has made a third and final attempt to reach this patient to refill the following medication: Biktarvy.      We have left voicemails on the following phone numbers: [phone], have been unable to leave messages on the following phone numbers: [phone] and [phone] and have sent a MyChart message.    Dates contacted: 7/1, 7/6, 7/11  Last scheduled delivery: shipped 6/7    The patient may be at risk of non-compliance with this medication. The patient should call the [facility] at [phone] (option 4) to refill medication.    [name]   [facility] Specialty Technician

## 2021-04-21 MED FILL — BIKTARVY 50 MG-200 MG-25 MG TABLET: ORAL | 30 days supply | Qty: 30 | Fill #5

## 2021-04-21 NOTE — Unmapped (Signed)
[facility] Pharm[facility] Refill Coordination Note    Specialty Medication(s) to be Shipped:   Infectious Disease: Biktarvy    Other medication(s) to be shipped: No additional medications requested for fill at this time     Morgan Hebert, DOB: 02-Mar-1986  Phone: [phone] (home) [phone] (work)      All above HIPAA information was verified with patient.     Was a Nurse, learning disability used for this call? No    Completed refill call assessment today to schedule patient's medication shipment from the [facility] [phone]).  All relevant notes have been reviewed.     Specialty medication(s) and dose(s) confirmed: Regimen is correct and unchanged.   Changes to medications: Olevia reports no changes at this time.  Changes to insurance: No  New side effects reported not previously addressed with a pharmacist or physician: None reported  Questions for the pharmacist: No    Confirmed patient received a Conservation officer, historic buildings and a Surveyor, mining with first shipment. The patient will receive a drug information handout for each medication shipped and additional FDA Medication Guides as required.       DISEASE/MEDICATION-SPECIFIC INFORMATION        N/A    SPECIALTY MEDICATION ADHERENCE     Medication Adherence    Patient reported X missed doses in the last month: 0  Specialty Medication: Biktarvy 50-200-25mg   Patient is on additional specialty medications: No        Were doses missed due to medication being on hold? No    Biktarvy 50-200-25 mg: 1 days of medicine on hand     REFERRAL TO PHARMACIST     Referral to the pharmacist: Not needed      [facility]     Shipping address confirmed in Epic.     Delivery Scheduled: Yes, Expected medication delivery date: 04/21/2021.     Medication will be delivered via Same Day Courier to the prescription address in Epic WAM.    Oretha Milch   [facility] Specialty Technician

## 2021-05-07 NOTE — Unmapped (Signed)
Morgan Hebert is a lovely 35 y.o. female here for a Study of Treatment and Reproductive Health Outcomes (STAR) Study (IRB [phone]) Visit on 05/06/2021. HIV positive participant. Denies complaints today. Pt is [facility] care at [facility].     Negative COVID-19 Screen: Wellness screening and temperature check conducted at clinic door. Participant reported no COVID-19 symptoms, no contact to a known or suspected COVID-19 case in the last 14 days, and temperature was within normal limits. Participant was cleared to enter the facility.     Study visit included:    Pregnancy Test: negative    Physical Exam: Height/Weight, Blood Pressure     Specimens Collected:     STI Testing: Urine: Ct/GC/Trich; Ct/GC pharyngeal & rectal swabs; vaginal HPV swab  Labs Collected: CD4, HIV Viral Load, RPR.   Hair collection per study protocol.    All labs results will be documented in Epic.

## 2021-05-09 NOTE — Unmapped (Signed)
05/08/21 Lab [name] results received and reviewed.    CD4 1496  HIV RNA <20  RPR non reactive

## 2021-05-14 NOTE — Unmapped (Signed)
[facility] Refill Coordination Note    Specialty Medication(s) to be Shipped:   Infectious Disease: Biktarvy    Other medication(s) to be shipped: No additional medications requested for fill at this time     Morgan Hebert, DOB: 30-Mar-1986  Phone: [phone] (home) [phone] (work)      All above HIPAA information was verified with patient.     Was a Nurse, learning disability used for this call? No    Completed refill call assessment today to schedule patient's medication shipment from the [facility] [phone]).  All relevant notes have been reviewed.     Specialty medication(s) and dose(s) confirmed: Regimen is correct and unchanged.   Changes to medications: Morgan Hebert reports no changes at this time.  Changes to insurance: No  New side effects reported not previously addressed with a pharmacist or physician: None reported  Questions for the pharmacist: No    Confirmed patient received a Conservation officer, historic buildings and a Surveyor, mining with first shipment. The patient will receive a drug information handout for each medication shipped and additional FDA Medication Guides as required.       DISEASE/MEDICATION-SPECIFIC INFORMATION        N/A    SPECIALTY MEDICATION ADHERENCE     Medication Adherence    Patient reported X missed doses in the last month: 0  Specialty Medication: BIKTARVY 50-200-25 mg tablet  Patient is on additional specialty medications: No  Any gaps in refill history greater than 2 weeks in the last 3 months: no  Demonstrates understanding of importance of adherence: yes  Informant: patient  Reliability of informant: reliable  Confirmed plan for next specialty medication refill: delivery by pharmacy  Refills needed for supportive medications: not needed        Were doses missed due to medication being on hold? No    Biktarvy 50-200-25 mg: 6 days of medicine on hand     REFERRAL TO PHARMACIST     Referral to the pharmacist: Not needed      [facility]     Shipping address confirmed in Epic.     Delivery Scheduled: Yes, Expected medication delivery date: 05/19/2021.     Medication will be delivered via Next Day Courier to the prescription address in Epic WAM.    Nyeemah Jennette D Deddrick Saindon   [facility] Specialty Technician

## 2021-05-18 MED FILL — BIKTARVY 50 MG-200 MG-25 MG TABLET: ORAL | 30 days supply | Qty: 30 | Fill #6

## 2021-06-16 NOTE — Unmapped (Signed)
[facility] Refill Coordination Note    Specialty Medication(s) to be Shipped:   Infectious Disease: Biktarvy    Other medication(s) to be shipped: No additional medications requested for fill at this time     Morgan Hebert, DOB: 09-16-1986  Phone: [phone] (home) [phone] (work)      All above HIPAA information was verified with patient.     Was a Nurse, learning disability used for this call? No    Completed refill call assessment today to schedule patient's medication shipment from the [facility] [phone]).  All relevant notes have been reviewed.     Specialty medication(s) and dose(s) confirmed: Regimen is correct and unchanged.   Changes to medications: Kalese reports no changes at this time.  Changes to insurance: No  New side effects reported not previously addressed with a pharmacist or physician: None reported  Questions for the pharmacist: No    Confirmed patient received a Conservation officer, historic buildings and a Surveyor, mining with first shipment. The patient will receive a drug information handout for each medication shipped and additional FDA Medication Guides as required.       DISEASE/MEDICATION-SPECIFIC INFORMATION        N/A    SPECIALTY MEDICATION ADHERENCE     Medication Adherence    Patient reported X missed doses in the last month: 0  Specialty Medication: biktarvy              Were doses missed due to medication being on hold? No    biktarvy  : 5 days of medicine on hand       REFERRAL TO PHARMACIST     Referral to the pharmacist: Not needed      [facility]     Shipping address confirmed in Epic.     Delivery Scheduled: Yes, Expected medication delivery date: 9/9.     Medication will be delivered via Next Day Courier to the prescription address in Epic WAM.    [name]   [facility] Specialty Technician

## 2021-06-17 MED FILL — BIKTARVY 50 MG-200 MG-25 MG TABLET: ORAL | 30 days supply | Qty: 30 | Fill #7

## 2021-07-09 NOTE — Unmapped (Signed)
[facility] Clinical Assessment & Refill Coordination Note    Morgan Hebert, DOB: January 08, 1986  Phone: [phone] (home) [phone] (work)    All above HIPAA information was verified with patient.     Was a Nurse, learning disability used for this call? No    Specialty Medication(s):   Infectious Disease: Biktarvy     Current Outpatient Medications   Medication Sig Dispense Refill   ??? acetaminophen (TYLENOL) 325 MG tablet Take 2 tablets (650 mg total) by mouth Every six (6) hours. 120 tablet 0   ??? acetaZOLAMIDE (DIAMOX) 500 mg ER 12 hr capsule Take 1,500 mg by mouth Two (2) times a day.      ??? bictegrav-emtricit-tenofov ala (BIKTARVY) 50-200-25 mg tablet Take 1 tablet by mouth daily. 30 tablet 11   ??? fluocinonide (LIDEX) 0.05 % ointment Apply topically Two (2) times a day. 30 g 5   ??? ibuprofen (MOTRIN) 600 MG tablet Take 1 tablet (600 mg total) by mouth Every six (6) hours. 60 tablet 0   ??? ketoconazole (NIZORAL) 2 % cream  (Patient not taking: Reported on 06/24/2020)     ??? ondansetron (ZOFRAN) 4 MG tablet TAKE ONE TABLET BY MOUTH EVERY 4 HOURS AS NEEDED FOR NAUSEA AND VOMITING (Patient not taking: Reported on 10/28/2020) 30 tablet 0   ??? zonisamide (ZONEGRAN) 50 MG capsule Start at 50mg  daily; if well-tolerated after 2 weeks, increase to 100mg  daily 90 capsule 3     No current facility-administered medications for this visit.        Changes to medications: Steffie reports no changes at this time.    Allergies   Allergen Reactions   ??? 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       Changes to allergies: No    SPECIALTY MEDICATION ADHERENCE     Biktarvy 50-200-25 mg: 10 days of medicine on hand        Specialty medication(s) dose(s) confirmed: Regimen is correct and unchanged.     Are there any concerns with adherence? No    Adherence counseling provided? Not needed    CLINICAL MANAGEMENT AND INTERVENTION      Clinical Benefit Assessment:    Do you feel the medicine is effective or helping your condition? Yes    HIV ASSOCIATED LABS:     HIV-1, Viral Load Det by PCR  Order: [id number]   Status: Edited Result - FINAL ??   Visible to patient: Yes (not seen) ??   Next appt: None ??   0 Result Notes    ?? Component Ref Range & Units 2 mo ago   (05/06/21) 7 mo ago   (12/04/20) 1 yr ago   (05/07/20) 1 yr ago   (12/02/19) 3 yr ago   (03/28/18)    HIV-1 RNA by PCR copies/mL <20  <20 CM  <20 CM  <20 CM  270 CM    Comment: HIV-1 RNA not detected   The reportable range for this assay is 20 to 10,000,000   copies HIV-1 RNA/mL.     log10 HIV-1 RNA log10copy/mL CANCELED  CANCELED CM  CANCELED CM  CANCELED CM  2.431    Comment: Unable to calculate result since non-numeric result obtained for   component test.     Result canceled by the ancillary.    Resulting Agency  01 01 01 01 01  Narrative  Performed by: Verdell Carmine  Performed at: ??[address] - [facility]   [address], [place], [place] [id number]   Lab Director: Jolene Schimke MD, Phone: ??[phone]      Specimen Collected: 05/06/21 09:15 Last Resulted: 05/07/21 13:36                  Lab Results   Component Value Date/Time    HIVRS Not Detected 10/28/2020 10:17 AM    HIVRS Detected (A) 02/25/2019 10:12 AM    HIVRS Not Detected 09/26/2018 11:07 AM    HIVRS 42,400 10/23/2017 12:00 AM    HIVRS not detected 03/29/2017 12:00 AM    HIVRS detected 10/25/2016 12:00 AM    HIVCP <20 06/24/2020 08:23 AM    HIVCP <20 06/12/2020 11:43 AM    HIVCP <40 (H) 02/25/2019 10:12 AM    HIVCP <40 (H) 07/04/2018 02:06 PM    HIVCP detected 10/23/2017 12:00 AM    RCD4 54.4 05/01/2015 12:00 AM    RCD4 53.7 10/24/2014 12:00 AM    ACD4 1,118 10/22/2018 09:45 AM    ACD4 1,696 (A) 05/01/2015 12:00 AM    ACD4 967 10/24/2014 12:00 AM    ACD4 854 06/23/2014 12:26 PM       Clinical Benefit counseling provided? Labs from 05/06/21 show evidence of clinical benefit    Adverse Effects Assessment:    Are you experiencing any side effects? No    Are you experiencing difficulty administering your medicine? No    Quality of Life Assessment:     How many days over the past month did your HIV  keep you from your normal activities? For example, brushing your teeth or getting up in the morning. 0    Have you discussed this with your provider? Not needed    Acute Infection Status:    Acute infections noted within Epic:  No active infections  Patient reported infection: None    Therapy Appropriateness:    Is therapy appropriate and patient progressing towards therapeutic goals? Yes, therapy is appropriate and should be continued    DISEASE/MEDICATION-SPECIFIC INFORMATION      N/A    PATIENT SPECIFIC NEEDS     - Does the patient have any physical, cognitive, or cultural barriers? No    - Is the patient high risk? No    - Does the patient require a Care Management Plan? No     - Does the patient require physician intervention or other additional services (i.e. nutrition, smoking cessation, social work)? No      SHIPPING     Specialty Medication(s) to be Shipped:   Infectious Disease: Biktarvy    Other medication(s) to be shipped: No additional medications requested for fill at this time     Changes to insurance: No    Delivery Scheduled: Yes, Expected medication delivery date: 07/14/21.     Medication will be delivered via Next Day Courier to the confirmed prescription address in [facility].    The patient will receive a drug information handout for each medication shipped and additional FDA Medication Guides as required.  Verified that patient has previously received a Conservation officer, historic buildings and a Surveyor, mining.    The patient or caregiver noted above participated in the development of this care plan and knows that they can request review of or adjustments to the care plan at any time.      All of the patient's questions and concerns have been addressed.    Cliffton Spradley  [facility] Specialty Pharmacist

## 2021-07-13 MED FILL — BIKTARVY 50 MG-200 MG-25 MG TABLET: ORAL | 30 days supply | Qty: 30 | Fill #8

## 2021-08-06 NOTE — Unmapped (Signed)
[facility] Refill Coordination Note    Specialty Medication(s) to be Shipped:   Infectious Disease: Biktarvy    Other medication(s) to be shipped: No additional medications requested for fill at this time     Morgan Hebert, DOB: [birth year]  Phone: [phone] (home) [phone] (work)      All above HIPAA information was verified with patient.     Was a Nurse, learning disability used for this call? No    Completed refill call assessment today to schedule patient's medication shipment from the [facility] [phone]).  All relevant notes have been reviewed.     Specialty medication(s) and dose(s) confirmed: Regimen is correct and unchanged.   Changes to medications: [name] reports no changes at this time.  Changes to insurance: No  New side effects reported not previously addressed with a pharmacist or physician: Yes - Patient reports Extremely nauseas. Patient would like to speak to the pharmacist today. Their provider is not aware.  Questions for the pharmacist: No    Confirmed patient received a Conservation officer, historic buildings and a Surveyor, mining with first shipment. The patient will receive a drug information handout for each medication shipped and additional FDA Medication Guides as required.       DISEASE/MEDICATION-SPECIFIC INFORMATION        N/A    SPECIALTY MEDICATION ADHERENCE     Medication Adherence    Patient reported X missed doses in the last month: 0  Specialty Medication: Biktarvy 50-200-25mg   Patient is on additional specialty medications: No  Informant: patient              Were doses missed due to medication being on hold? No    Biktarvy 50-200-25mg : 11 days of medicine on hand       REFERRAL TO PHARMACIST     Referral to the pharmacist: Not needed      [facility]     Shipping address confirmed in Epic.     Delivery Scheduled: Yes, Expected medication delivery date: 08/13/21.     Medication will be delivered via Next Day Courier to the prescription address in Epic [place].    Morgan Hebert   [facility] Specialty Technician

## 2021-08-12 MED FILL — BIKTARVY 50 MG-200 MG-25 MG TABLET: ORAL | 30 days supply | Qty: 30 | Fill #9

## 2021-08-18 ENCOUNTER — Ambulatory Visit: Admit: 2021-08-18 | Discharge: 2021-08-18 | Payer: BLUE CROSS/BLUE SHIELD

## 2021-08-18 DIAGNOSIS — Z01419 Encounter for gynecological examination (general) (routine) without abnormal findings: Principal | ICD-10-CM

## 2021-08-18 DIAGNOSIS — Z8632 Personal history of gestational diabetes: Principal | ICD-10-CM

## 2021-08-18 DIAGNOSIS — R1114 Bilious vomiting: Principal | ICD-10-CM

## 2021-08-18 DIAGNOSIS — G932 Benign intracranial hypertension: Principal | ICD-10-CM

## 2021-08-18 DIAGNOSIS — Z124 Encounter for screening for malignant neoplasm of cervix: Principal | ICD-10-CM

## 2021-08-18 DIAGNOSIS — B20 Human immunodeficiency virus [HIV] disease: Principal | ICD-10-CM

## 2021-08-18 DIAGNOSIS — L918 Other hypertrophic disorders of the skin: Principal | ICD-10-CM

## 2021-08-18 DIAGNOSIS — S025XXB Fracture of tooth (traumatic), initial encounter for open fracture: Principal | ICD-10-CM

## 2021-08-18 DIAGNOSIS — R110 Nausea: Principal | ICD-10-CM

## 2021-08-18 LAB — CBC W/ AUTO DIFF
BASOPHILS ABSOLUTE COUNT: 0 10*9/L (ref 0.0–0.1)
BASOPHILS RELATIVE PERCENT: 0.7 %
EOSINOPHILS ABSOLUTE COUNT: 0.4 10*9/L (ref 0.0–0.5)
EOSINOPHILS RELATIVE PERCENT: 5.3 %
HEMATOCRIT: 39.2 % (ref 34.0–44.0)
HEMOGLOBIN: 13 g/dL (ref 11.3–14.9)
LYMPHOCYTES ABSOLUTE COUNT: 3.1 10*9/L (ref 1.1–3.6)
LYMPHOCYTES RELATIVE PERCENT: 44.1 %
MEAN CORPUSCULAR HEMOGLOBIN CONC: 33.2 g/dL (ref 32.0–36.0)
MEAN CORPUSCULAR HEMOGLOBIN: 29.8 pg (ref 25.9–32.4)
MEAN CORPUSCULAR VOLUME: 89.6 fL (ref 77.6–95.7)
MEAN PLATELET VOLUME: 8.8 fL (ref 6.8–10.7)
MONOCYTES ABSOLUTE COUNT: 0.5 10*9/L (ref 0.3–0.8)
MONOCYTES RELATIVE PERCENT: 7.4 %
NEUTROPHILS ABSOLUTE COUNT: 3 10*9/L (ref 1.8–7.8)
NEUTROPHILS RELATIVE PERCENT: 42.5 %
PLATELET COUNT: 231 10*9/L (ref 150–450)
RED BLOOD CELL COUNT: 4.38 10*12/L (ref 3.95–5.13)
RED CELL DISTRIBUTION WIDTH: 14.3 % (ref 12.2–15.2)
WBC ADJUSTED: 7 10*9/L (ref 3.6–11.2)

## 2021-08-18 LAB — BASIC METABOLIC PANEL
ANION GAP: 8 mmol/L (ref 5–14)
BLOOD UREA NITROGEN: 7 mg/dL — ABNORMAL LOW (ref 9–23)
BUN / CREAT RATIO: 8
CALCIUM: 9.5 mg/dL (ref 8.7–10.4)
CHLORIDE: 107 mmol/L (ref 98–107)
CO2: 25.8 mmol/L (ref 20.0–31.0)
CREATININE: 0.87 mg/dL — ABNORMAL HIGH
EGFR CKD-EPI (2021) FEMALE: 89 mL/min/{1.73_m2} (ref >=60)
GLUCOSE RANDOM: 79 mg/dL (ref 70–179)
POTASSIUM: 3.7 mmol/L (ref 3.4–4.8)
SODIUM: 141 mmol/L (ref 135–145)

## 2021-08-18 LAB — ALT: ALT (SGPT): <7 U/L — ABNORMAL LOW (ref 10–49)

## 2021-08-18 LAB — AST: AST (SGOT): 11 U/L (ref <=34)

## 2021-08-18 LAB — BILIRUBIN, TOTAL: BILIRUBIN TOTAL: 0.3 mg/dL (ref 0.3–1.2)

## 2021-08-18 LAB — HEMOGLOBIN A1C
ESTIMATED AVERAGE GLUCOSE: 97 mg/dL
HEMOGLOBIN A1C: 5 % (ref 4.8–5.6)

## 2021-08-18 MED ORDER — PROCHLORPERAZINE MALEATE 10 MG TABLET
ORAL_TABLET | Freq: Two times a day (BID) | ORAL | 2 refills | 15 days | Status: CP | PRN
Start: 2021-08-18 — End: 2021-11-16
  Filled 2021-10-13: qty 30, 15d supply, fill #0

## 2021-08-18 MED ORDER — ACETAZOLAMIDE ER 500 MG CAPSULE,EXTENDED RELEASE
ORAL_CAPSULE | Freq: Two times a day (BID) | ORAL | 11 refills | 30.00000 days | Status: CP
Start: 2021-08-18 — End: 2022-08-18
  Filled 2021-10-13: qty 120, 30d supply, fill #0

## 2021-08-18 MED ORDER — BIKTARVY 50 MG-200 MG-25 MG TABLET
ORAL_TABLET | Freq: Every day | ORAL | 11 refills | 30.00000 days | Status: CP
Start: 2021-08-18 — End: 2022-08-18
  Filled 2021-09-09: qty 30, 30d supply, fill #0

## 2021-08-18 NOTE — Unmapped (Signed)
Name: LEIGHANNA KIRN  Date: 08/18/2021  Address: [address] [place] [address]   [place] of Residence:  [facility]  Phone: [phone]     Started assessment with patient options: in clinic     Is this the same address for mailing? Yes  If No, Mailing Address is:     Housing Scientist, water quality  Medicaid    Tax Filing Status  Head of Household    Employment Status  Employed Full Time    Income  Salary/Wages    If no or low income, how are you meeting your basic needs?  Food Stamps/EBT    List Tax Household Members including relationship to you: Alphonsa Gin, Shanon Payor, Vincente Poli    Someone in my household receives: No Household Income/Deductions of any kind  Specify who: N/A    Do you have a current diagnosis for Hepatitis C?  Lab Results   Component Value Date    HEPCAB Nonreactive 10/28/2020       Have you used tobacco products four or more times per week in the last six months?  No    Teacher, adult education  Patient has affordable insurance through [employer], [place], and or Employment and is not eligible.    Patient given ACA education if they qualified based on answers to questions above.     Patient was informed of the following programs;   N/A    The following applications/handouts were given to patient:   N/A    The following forms were also started with the patient:   N/A    Juanell Fairly application status: Incomplete; patient needs to send 2 pay stubs    Patient is applying for [employer] on Charges Only     Additional Comments: Sent an email for patient to send 2 pay stubs.      Rhetta Mura  [facility] Benefits Counselor  Time of Intervention-84mins

## 2021-08-18 NOTE — Unmapped (Signed)
-   Re-start Diamox 1000 mg twice a day. If after 1 month this is not working, please send me a message and we can discuss increasing back to 1500 mg twice a day.  - Compazine 10 mg up to twice a day as needed for nausea and vomiting (try to take this only when needed - hopefully the nausea and vomiting improve soon with restarting Diamox)  - Make sure you get an appointment for as soon as possible with your eye doctor for a full exam  - We will see you back in 3 months for a recheck with neurology  - If you are still having headache pain after 1 month back on the Diamox, we will consider adding a migraine prevention medication at that time.   - If possible, try not to take Tylenol and Ibuprofen more than 3 times a week to reduce the risk of medication overuse headache

## 2021-08-18 NOTE — Unmapped (Signed)
Internal Medicine Clinic Visit    Reason for visit:  Annual visit    A/P:  Well Woman exam  Pap done. Has had Bilateral TL. Discussed cutting down on marijuana. Encouraged her to continue dance/exercise  Feels safe in her relationship  HIV  - Diagnosed in 02/2014 when patient presented to the ED with adenopathy. Initial CD4 644, HIV RNA 11,000  -Followed by Dr Rosemarie Beath.   Lives with husband who is HIV neg. He is aware of her status  On Biktarvy  Lab Results   Component Value Date    ACD4 1,118 10/22/2018    CD4 54 10/22/2018    HIVCP <20 06/24/2020    HIVRS Not Detected 10/28/2020     History of diet controlled gestational diabetes mellitus (GDM)  Overview:  08/18/2021  Check A1C  Addendum: normal  Lab Results   Component Value Date    A1C 5.0 08/18/2021     Nausea  Nausea. Describes reflux like symptoms. Marijuana use may be contributing. Not related to food, so less likely Gall bladder disease, though at risk  08/18/2021  Saw neuro, started on promethazine and diamox for pseudotumor.   If persistent symptoms, would start omeprazole 20 mg qam before breakfast.   Next visit: will discuss more non pharmacologic therapies: wt loss, etc  Cervical cancer screening  Pap done; 11/2020: inadequate  Pseudotumor cerebri  - Patient reports a long history of complex headaches; diagnosed with pseudotumor 2019-  Now with nausea.   08/18/2021  Seen by neurology, restarted on acetazolamide, given promethazine for nausea  Health Maintenance:   Health Maintenance   Topic Date Due   ??? COVID-19 Vaccine (2 - Pfizer series) 11/18/2020   ??? Pap Smear (21-65)  12/10/2023   ??? DTaP/Tdap/Td Vaccines (2 - Td or Tdap) 02/04/2029   ??? Pneumococcal Vaccine 0-64  Completed   ??? Hepatitis C Screen  Completed   ??? Influenza Vaccine  Completed     Return in about 6 months (around 02/15/2022).  Next Visit:   ??? Assess GERD  __________________________________________________________    HPI:  Pt has pseudotumor cerebri, obesity  Baby are doing okay. Has a 107 year old and 35 year old. Everything is good.   Saw neurology-for nausea. Is having migraines.   Had Covid 19-slightly increased the nausea. Had it Sept 19, 2022.   Exercising: likes dancing. Diet: cooks at home. Favorite meal: salmon. Beef stroganoff, spaghetti.   Marijuana-every day.  a lot.   Alcohol: none  Mood: depends on how her head is feeling.   Feels safe. Husband is hIV neg.   Working for [employer]-things are doing okay.     Lab Results   Component Value Date    ACD4 1,118 10/22/2018    CD4 54 10/22/2018    HIVCP <20 06/24/2020    HIVRS Not Detected 10/28/2020     __________________________________________________________    Problem List:  Patient Active Problem List   Diagnosis   ??? Pseudotumor cerebri   ??? HIV   ??? History of diet controlled gestational diabetes mellitus (GDM)   ??? Nausea       Medications:  Reviewed in EPIC    _________________________________________________________    Physical Exam:   Vital Signs:  Vitals:    08/18/21 1602   BP: 107/80   Pulse: 81   Resp: 16   Temp: 36.7 ??C (98.1 ??F)   TempSrc: Oral   Weight: 72.7 kg (160 lb 4.4 oz)   Height: 149.9 cm (4'  11.02)     Wt Readings from Last 3 Encounters:   08/18/21 72.7 kg (160 lb 4.4 oz)   08/18/21 72.7 kg (160 lb 3.2 oz)   08/18/21 72.6 kg (160 lb)     Gen: Well appearing, NAD  CV: RRR, no murmurs  Pulm: CTA bilaterally, no crackles or wheezes  Abd: Soft, NTND, normal BS. No HSM, slightly distended  Pelvic: exam chaperoned by nurse, normal vagina and vulva, normal cervix without lesions, polyps or tenderness and pap smear done today    Records review  Last   Lab Results   Component Value Date    CREATININE 0.87 (H) 08/18/2021    CHOL 140 12/04/2020    HDL 50 12/04/2020    LDL 73 12/04/2020    NONHDL 90 12/04/2020    TRIG 93 12/04/2020    A1C 5.0 08/18/2021    PAP Negative for intraephithelial lesion or malignancy 03/29/2017        The ASCVD Risk score Denman George DC Montez Hageman, et al., 2013) failed to calculate.   Medication adherence and barriers to the treatment plan have been addressed. Opportunities to optimize healthy behaviors have been discussed. Patient / caregiver voiced understanding.           I personally spent 25 minutes face-to-face and non-face-to-face in the care of this patient, which includes all pre, intra, and post visit time on the date of service.

## 2021-08-18 NOTE — Unmapped (Addendum)
Neurology Follow-up Visit Note     [facility]  [address]  [address]  [place] [address]     Date: 08/18/2021   Patient Name: Morgan Hebert   MRN: [id number]   PCP: Jacquiline Doe  Referring Provider: Artelia Laroche, MD       Assessment and Plan          [name] is a 35 y.o. female presenting for follow-up of pseudotumor cerebri.     Pseudotumor Cerebri  Diagnosed in 2019 after 1 year of HA that progressed to include visual changes. Opening pressures on LP have been as high as 40. She has not required LP since 2020 and was maintained on Diamox only however she continued to have some headaches despite Diamox dosing as high as 1500 mg BID (this dose was increased from 1000 BID in the setting of pregnancy in 2020). She presents today with worsened HA and 1 month of persistent AM N/V in the setting of being off therapy for 2 months. We will restart Diamox today and provide prescription for 3 months of Compazine (she reports Zofran stopped working but she tried a friend's Compazine with good results). Advised that she get formal eye exam as soon as possible due to mild R-sided papilledema on today's clinic exam. She is not reporting vision changes. Given report that she has continued to have HA pain despite high dose Diamox (which did control N/V and reduce HA) she may need additional Migraine preventative therapy if this continues after restarting Diamox. Advised patient to contact me 1 month after restarting Diamox, at which point we can add on Migraine agent if indicated.  - Restart Diamox 1000 BID  - Compazine 10 mg BID PRN (ordered EKG to be done at PCP office this afternoon)  - Advised patient to schedule formal ophthalmology exam  - Avoid Tylenol and NSAIDs >3 days per week to reduce risk of medication overuse HA  - If requiring daily Tylenol and NSAIDs after being on Diamox for 1 month, consider addition of Nortriptyline for migraine prevention     Return Visit in: 3 months    I personally spent 60 minutes face-to-face and non-face-to-face in the care of this patient, which includes all pre, intra, and post visit time on the date of service.        Patient was seen and discussed with Dr.Traub who agrees with assessment and plan.    Alwyn Pea, MD   Neurology, PGY-2    ATTESTATION NOTE:  I saw and evaluated the patient, participating in the key portions of the service.  I reviewed the resident???s note and agree with the resident???s findings and plan as documented in their note.    IIH - worsening headache since stopping diamox and mild papilledema on exam today  Some component of chronic headache from superimposed migraine  Plan to restart diamox, ophtho follow up and then consider adding a tricyclic for migraine    Jamelle Rushing, MD  Associate Professor  Department of Neurology  [facility]           HPI         HPI: [name] is a 35 y.o. female who presents to the [place] [facility] for follow-up of pseudotumor cerebri. Patient was last seen by [facility] on 05/15/2019.      To review their relevant history:  Diagnosed with migraines prior to 2018. Developed vision changes in 2019 which prompted evaluation for possible IIH and diagnosis of pseudotumor cerebri. She estimates she has had a total of about 10 LPs with the last being in 2020 at the time of delivery of her last child. Diamox was increased during her pregnancy due to weight gain and worsening of symptoms.     Today, her biggest issue is nausea and vomiting. She is throwing up each morning for a month now. Of note, she got COVID in September and noticed a worsening of her HA at that time. This has not resolved. Prior to COVID, symptoms had been pretty stable since 2020 however she has been taking daily Tylenol and Ibuprofen for persistent mild HA. She has used Zofran for intermittent N/V however this has not been effective for the last month. She tried Prochlorperazine 10mg  BID and has been taking this for one week with good effect.     Has lost 10 pounds in 2 weeks due to vomiting. Feels she is not able to maintain adequate hydration. She is having labs collected this afternoon at PCP's office.    HA is bilateral at base of neck with pressure behind eyes. She does experience light and sound sensitivity with HA. Hot showers help.     No balance changes or difficulties walking. No vision changes today or recently.         Allergies   Allergen Reactions   ??? 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        Current Outpatient Medications   Medication Sig Dispense Refill   ??? acetaminophen (TYLENOL) 325 MG tablet Take 2 tablets (650 mg total) by mouth Every six (6) hours. 120 tablet 0   ??? bictegrav-emtricit-tenofov ala (BIKTARVY) 50-200-25 mg tablet Take 1 tablet by mouth daily. 30 tablet 11   ??? ibuprofen (MOTRIN) 600 MG tablet Take 1 tablet (600 mg total) by mouth Every six (6) hours. 60 tablet 0   ??? acetaZOLAMIDE (DIAMOX) 500 mg ER 12 hr capsule Take 2 capsules (1,000 mg total) by mouth Two (2) times a day. 120 capsule 11   ??? BINAXNOW COVID-19 AG SELF TEST Kit TEST AS DIRECTED TODAY     ??? prochlorperazine (COMPAZINE) 10 MG tablet Take 1 tablet (10 mg total) by mouth two (2) times a day as needed for nausea. 30 tablet 2     No current facility-administered medications for this visit.       Past Medical History:   Diagnosis Date   ??? Abnormal mammogram    ??? Constipation    ??? Diarrhea    ??? 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 Environmental allergies    ??? GDM- diet controlled 03/07/2019   ??? HIV (human immunodeficiency virus infection) (CMS-HCC)    ??? HIV (human immunodeficiency virus infection) (CMS-HCC)    ??? IIH (idiopathic intracranial hypertension)    ??? IUD (intrauterine device) in place    ??? Lymphadenopathy 02/11/2014   ??? Migraine    ??? Obesity    ??? Pseudotumor cerebri    ???  Rubella non-immune status, antepartum 01/14/2019    [ ]  PP MMR   ??? STD (sexually transmitted disease)    ??? Supervision of high risk pregnancy, unspecified, unspecified trimester 05/30/2018    Dating: 9/21 (Unsure of LMP as had a D&C for a missed AB in 06/2018 and discovered she was pregnant in 07/2018)                                    Prenatal Screening:  [x]  Prenatal labs reviewed [x]  SMA 2 copies/Hgb electropheresis wnl/CF [ ]   [x]  1 hr GTT= 250 but done 3 days after BMZ (4/22 and 4/23) --> Repeat 1 hr GTT  5-18= 229  [ ]  28 wk CBC/RPR [ ]  GBS   Fetal Screening:  [x]  aneuploidy scre   ??? Threatened premature labor in third trimester 01/31/2019    Received BMZ 4/22 ([place])-4/23 [facility]) for concern for PTL. SVE cl/th/hi.       Past Surgical History:   Procedure Laterality Date   ??? DILATION AND CURETTAGE OF UTERUS     ??? LUMBAR PUNCTURE     ??? PR CESAREAN DELIVERY ONLY N/A 03/29/2019    Procedure: CESAREAN DELIVERY ONLY;  Surgeon: Asher Muir, MD;  Location: L&D C-SECTION OR SUITES [facility];  Service: Maternal-Fetal Medicine   ??? PR COLONOSCOPY W/BIOPSY SINGLE/MULTIPLE  07/03/2014    Procedure: COLONOSCOPY, FLEXIBLE, PROXIMAL TO SPLENIC FLEXURE; WITH BIOPSY, SINGLE OR MULTIPLE;  Surgeon: Billie Ruddy, MD;  Location: GI PROCEDURES [facility];  Service: Gastroenterology   ??? PR DILATION/CURETTAGE,DIAGNOSTIC N/A 06/14/2018    Procedure: DILATION AND CURETTAGE, DIAGNOSTIC AND/OR THERAPEUTIC (NON OBSTETRICAL);  Surgeon: Nelle Don, MD;  Location: [facility]; Service: [facility] Primary Gynecology   ??? PR LAP,RMV  ADNEXAL STRUCTURE Bilateral 06/04/2019    Procedure: R21  LAPAROSCOPY, SURGICAL; W/REMOVAL OF ADNEXAL STRUCTURES (PARTIAL OR TOTAL OOPHORECTOMY &/OR SALPINGECTOMY);  Surgeon: Joneen Caraway, MD;  Location: [facility];  Service: Family Planning   ??? PR SIGMOIDOSCOPY FLX DX W/COLLJ SPEC BR/WA IF PFRMD N/A 06/25/2020    Procedure: SIGMOIDOSCOPY, FLEXIBLE; DIAGNOSTIC, WITH OR WITHOUT COLLECTION OF SPECIMEN(S) BY BRUSHING OR WASHING;  Surgeon: Bronson Curb, MD;  Location: [facility] PROCEDURES [facility];  Service: Gastroenterology   ??? PR UPPER GI ENDOSCOPY,BIOPSY N/A 07/03/2014    Procedure: UGI ENDOSCOPY; WITH BIOPSY, SINGLE OR MULTIPLE;  Surgeon: Billie Ruddy, MD;  Location: GI PROCEDURES [facility];  Service: Gastroenterology   ??? WISDOM TOOTH EXTRACTION         Social History     Socioeconomic History   ??? Marital status: Married     Spouse name: Loraine Leriche    ??? Number of children: 2   ??? Years of education: None   ??? Highest education level: None   Tobacco Use   ??? Smoking status: Former Smoker     Types: Cigarettes     Quit date: 03/10/2021     Years since quitting: 0.4   ??? Smokeless tobacco: Never Used   Vaping Use   ??? Vaping Use: Never used   Substance and Sexual Activity   ??? Alcohol use: Yes     Alcohol/week: 2.0 standard drinks     Types: 1 Shots of liquor, 1 Glasses of wine per week     Comment: rare   ??? Drug use: Yes     Frequency: 7.0 times per week  Types: Marijuana     Comment:  for migraines   ??? Sexual activity: Yes     Partners: Male   Social History Narrative    Patient lives in [place], [place] with her Husband and two daughters..  She is not employed.       Family History   Problem Relation Age of Onset   ??? Cervical cancer Mother    ??? Diabetes Mother    ??? Asthma Mother    ??? Hypertension Mother    ??? Diabetes Maternal Aunt    ??? Hypertension Maternal Aunt    ??? Breast cancer Maternal Grandmother    ??? Cancer Maternal Grandmother    ??? No Known Problems Daughter    ??? Glaucoma Neg Hx             Objective        Vital signs: BP 107/80 (BP Site: L Arm, BP Position: Sitting)  - Pulse 81  - Ht 149.9 cm (4' 11.02)  - Wt 72.7 kg (160 lb 3.2 oz)  - BMI 32.34 kg/m??        Physical Exam:  General Appearance:Well appearing. In no acute distress.  HEENT: Head is atraumatic and normocephalic. Sclera anicteric without injection. Oropharyngeal membranes are moist with no erythema or exudate.  Neck: Supple.  Lungs: normal WOB on RA  Heart: Radial pulses 2+ bilaterally.  Abdomen: Nondistended.  Extremities: No clubbing, cyanosis, or edema.    Neurological Examination:     Mental Status: Alert, conversant, able to follow conversation and interview. Spontaneous speech was fluent without word finding pauses, dysarthria, or paraphasic errors. Comprehension was intact to simple and multi-step commands. Memory for recent and remote events was intact. Affect was full range and appropriate to context.    Cranial Nerves: Visual fields intact to direct confrontation. Fundoscopic exam reveals mild R sided papilledema present. PERRL 4 mm. Pursuit eye movements were uninterrupted with full range and without more than end-gaze nystagmus. Facial sensation intact bilaterally to light touch on the forehead, cheek, and chin. Face symmetric at rest. Normal facial movement bilaterally, including forehead, eye closure and grimace/smile. Hearing intact to conversation. Shoulder shrug full strength bilaterally. Palate movement is symmetric. Tongue protrudes midline and tongue movements are normal.    Motor Exam: Normal bulk.  Normal tone in the upper and lower extremities.  No tremors, myoclonus, or other adventitious movement.  Pronator drift is absent.  UE R/L: deltoid 5/5, biceps 5/5, triceps 5/5, wrist flexion 5/5, wrist extension 5/5, finger spread 5/5 and hand grip strong/strong.  LE R/L: hip flexion 5/5, hip extension 5/5, quadriceps 5/5, hamstrings 5/5, dorsiflexion 5/5 and plantar flexion 5/5.  Fine finger movements and foot taps had normal velocity and amplitude with no breakdown.      Reflexes:   R L   Biceps +2 +2   Brachioradialis +2 +2   Triceps +2 +2   Patella +2 +2   Achilles +2 +2       Sensory: Sensation normal to light touch and temperature sensation to cold in both hands and both feet and to position sense and vibration distally in the fingers and toes. Romberg signnegative.    Cerebellar/Coordination/Gait: Rapid alternating movements are normal in bilateral upper extremities. Finger-to-nose is normal without ataxia or dysmetria bilaterally. Heel-to-shin is normal without ataxia or dysmetria bilaterally. Gait exam demonstrates normal posture, base, stride length, arm swing and turns.

## 2021-08-18 NOTE — Unmapped (Signed)
INFECTIOUS DISEASES CLINIC  [address]  [place], [place]  [address]  P [phone]  F [phone]     Primary care provider: Jacquiline Doe, MD    Assessment/Plan:      Morgan Hebert, a 35 y.o. female seen today for routine HIV followup.    Plan:  HIV  Fills ART via private insurance. RW renewal needed for this period (caps on charges, to meet with benefits counselor today).  ?? Continue current therapy. E-prescribed today. She uses [facility].  ?? Checking HIV RNA & safety labs (brief return)    ?? Encouraged continued excellent ARV adherence.   Lab Results   Component Value Date    ACD4 1,118 10/22/2018    CD4 54 10/22/2018    HIVCP <20 06/24/2020    HIVRS Not Detected 10/28/2020   05/06/21 Lab [name] (STAR study)  ?? HIV RNA <20  ?? CD4 1496 (52%)    Nausea  ?? I will discuss this with Dr. Peterson Hebert and Dr. Brooke Hebert. Concerning pattern - acute-on-chronic symptoms with mild weight loss.   ?? Possible etiologies include GERD (she has some mild symptoms), hiatal hernia (does have occasional chest pressure), medications (no new meds and not temporally associated), marijuana (use is about the same as it has been), pseudotumor (her symptoms seem stable, she will discuss with Dr. Peterson Hebert).  ?? I am not opposed to use of prochlorperazine apart from drowsiness (she denies this has been an issue), risk of EPS, and side effects if stopped abruptly.   ?? We did discuss my concerns about taking medication not prescribed to her as we are not able to help navigate drug-drug interactions and side effects.    Pseudotumor cerebri  ?? [name], appt with Dr. Peterson Hebert later today. Her symptoms are stable.  ?? For ophtho followup.    Mental Health  ?? Provided support, in therapy locally.  ?? Overall doing well and feels safe.     Sexual health & secondary prevention  Sex with men. Monogamous with single partner. She does disclose status. Never uses condoms, we have discussed U=U. Has BTL.    Lab Results   Component Value Date    RPR Non Reactive 05/06/2021    LABRPR Nonreactive 06/12/2020    CTNAA Negative 05/06/2021    CTNAA Negative 05/06/2021    CTNAA Negative 12/04/2020    GCNAA Negative 05/06/2021    GCNAA Negative 05/06/2021    GCNAA Negative 12/04/2020    SPECTYPE Urine 10/28/2020    SPECTYPE Swab 10/28/2020    SPECTYPE Swab 10/28/2020    SPECSOURCE Urine 10/28/2020    SPECSOURCE Throat 10/28/2020    SPECSOURCE Rectum 10/28/2020     ?? GC/CT NAATs -- Negative 04/2021   ?? RPR -- NR 04/2021 - repeat 1Y      Health maintenance  Lab Results   Component Value Date    CREATININE 0.89 12/04/2020    QFTTBGOLD NEGATIVE 02/13/2014    HEPCAB Nonreactive 10/28/2020    CHOL 140 12/04/2020    HDL 50 12/04/2020    LDL 73 12/04/2020    NONHDL 90 12/04/2020    TRIG 93 12/04/2020    A1C 5.4 12/04/2020    PAP Negative for intraephithelial lesion or malignancy 03/29/2017    FINALDX  03/29/2019     A: Placenta, delivery    - Preterm (36 weeks 1 days EGA) singleton third trimester placenta, weight 320 g (weight 10th percentile for EGA)   -  No acute chorioamnionitis identified   - Three vessel umbilical cord with narrow diameter (0.9 cm) and a furcate insertion  - Placental plate: accelerated maturation of chorionic villi,  increased intervillous and subchorionic fibrin, focal fetal thrombotic vasculopathy, small placental infarction (1.2 cm)        This electronic signature is attestation that the pathologist personally reviewed the submitted material(s) and the final diagnosis reflects that evaluation.       Communicable diseases  # TB - no longer needed; negative IGRA 2015 and low/no risk  # HCV - negative 10/2020; rescreen w/Ab q1-2y    Cancer screening  # Anorectal - not yet done  # Colorectal - SCREEN AGE 45+  # Liver - not yet done  # Lung - not applicable    # Breast - SCREEN AGE 42+ -- Q1-2Y  # Cervical - neg cyto 03/2017 , insufficient sample 11/2020 - needs repeat    Cardiovascular disease  # The ASCVD Risk score Denman George DC Montez Hageman, et al., 2013) failed to calculate.    Immunization History   Administered Date(s) Administered   ??? COVID-19 VACC,MRNA,(PFIZER)(PF)(IM) 10/28/2020   ??? Influenza Vaccine Quad (IIV4 PF) 55mo+ injectable 06/30/2015, 06/15/2016, 07/04/2018, 10/28/2020   ??? MMR 03/31/2019   ??? PNEUMOCOCCAL POLYSACCHARIDE 23 03/31/2019   ??? TdaP 02/05/2019     ?? Screening ordered today: none  ?? Immunizations ordered today: influenza. She is amenable to second COVID vaccine series today.     I spent 25 minutes with the patient. I spent an additional 10 minutes on pre- and post-visit activities.   Counseled as documented above regarding mental health, COVID-19 vaccine, nausea management, medication adherence, weight management and healthy lifestyle choices and need for recommended screening tests.    Disposition  Return to clinic 5-6 months or sooner if needed.    Morgan Bristol, MD, MPH   [facility] Infectious Diseases Clinic at [facility]  [address]   [place], [place] [address]  Phone: [phone]   Fax: [phone]       Subjective:      Chief Complaint   HIV followup    HPI  Return patient visit for Morgan Hebert, a 35 y.o. woman with well-controlled HIV.   Nausea in AM pretty regularly, sometimes recurs in PM. Feels hot, then nausea occurs. Usually throws up stomach acid, not food. Has not thrown up any pills.  Takes nausea pill (prochlorperazine 10 mg) from a friend who was getting cancer treatments - helps much more than Zofran.    Back together with her husband - things are going well. Baby Morgan Hebert) is doing great. Older daughter Morgan Hebert) is as well, has custody of niece Morgan Hebert) which has been rocky (older sister of niece who died, mom just released from jail) but this is going much more smoothly.  No more cigarettes, only marijuana  Only one COVID vaccine so far. Amenable to getting second vaccine but does not want to get it at this visit. States she will get it when she sees Dr. Brooke Hebert today (end of the day) as she is worried about side effects interfering with her other appts.     Past Medical History:   Diagnosis Date   ??? Abnormal mammogram    ??? Constipation    ??? Diarrhea    ??? 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 Environmental allergies    ??? GDM- diet controlled 03/07/2019   ??? HIV (human immunodeficiency virus infection) (CMS-HCC)    ??? HIV (human immunodeficiency virus infection) (CMS-HCC)    ??? IIH (idiopathic intracranial hypertension)    ??? IUD (intrauterine device) in place    ??? Migraine    ??? Obesity    ??? Pseudotumor cerebri    ??? Rubella non-immune status, antepartum 01/14/2019    [ ]  PP MMR   ??? STD (sexually transmitted disease)    ??? Supervision of high risk pregnancy, unspecified, unspecified trimester 05/30/2018    Dating: 9/21 (Unsure of LMP as had a D&C for a missed AB in 06/2018 and discovered she was pregnant in 07/2018)                                    Prenatal Screening:  [x]  Prenatal labs reviewed [x]  SMA 2 copies/Hgb electropheresis wnl/CF [ ]   [x]  1 hr GTT= 250 but done 3 days after BMZ (4/22 and 4/23) --> Repeat 1 hr GTT  5-18= 229  [ ]  28 wk CBC/RPR [ ]  GBS   Fetal Screening:  [x]  aneuploidy scre   ??? Threatened premature labor in third trimester 01/31/2019    Received BMZ 4/22 ([facility])-4/23 [facility]) for concern for PTL. SVE cl/th/hi.     Medications and Allergies   Reviewed and updated today. See bottom of this visit's encounter summary for details.  Current Outpatient Medications on File Prior to Visit   Medication Sig   ??? acetaminophen (TYLENOL) 325 MG tablet Take 2 tablets (650 mg total) by mouth Every six (6) hours.   ??? acetaZOLAMIDE (DIAMOX) 500 mg ER 12 hr capsule Take 1,500 mg by mouth Two (2) times a day.    ??? bictegrav-emtricit-tenofov ala (BIKTARVY) 50-200-25 mg tablet Take 1 tablet by mouth daily.   ??? [EXPIRED] fluocinonide (LIDEX) 0.05 % ointment Apply topically Two (2) times a day.   ??? ibuprofen (MOTRIN) 600 MG tablet Take 1 tablet (600 mg total) by mouth Every six (6) hours.   ??? ketoconazole (NIZORAL) 2 % cream  (Patient not taking: Reported on 06/24/2020)   ??? ondansetron (ZOFRAN) 4 MG tablet TAKE ONE TABLET BY MOUTH EVERY 4 HOURS AS NEEDED FOR NAUSEA AND VOMITING (Patient not taking: Reported on 10/28/2020)   ??? zonisamide (ZONEGRAN) 50 MG capsule Start at 50mg  daily; if well-tolerated after 2 weeks, increase to 100mg  daily     No current facility-administered medications on file prior to visit.     Allergies   Allergen Reactions   ??? 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     Social History  General - lives in [place] with her husband and her 2 daughters (born 2008, 2020). Also has custody of niece.   ?? Doreene Adas and his girlfriend are there sometimes as well.   ?? 2021 - Family trauma, her 12yo niece committed suicide and her aunt passed away from cancer.   ?? Mom and sister are both positive (sister is perinatally infected, patient with presumed sexual transmission from female partner).  ?? Works at home for [employer], dreams of becoming a Clinical research associate  or a Therapist, music. Loves vampire shows like True Blood.   Sexual History - sex with men   Substance Use - marijuana (smokes nightly), smokes maybe 3 tobacco cigarettes a day  Social History     Tobacco Use   ??? Smoking status: Current Some Day Smoker     Types: Cigarettes   ??? Smokeless tobacco: Never Used   Substance Use Topics   ??? Alcohol use: Yes     Alcohol/week: 2.0 standard drinks     Types: 1 Shots of liquor, 1 Glasses of wine per week     Comment: rare     Review of Systems  As per HPI. Remainder of 10 systems reviewed, negative.      Objective:      BP 121/91 (BP Site: L Arm, BP Position: Sitting)  - Pulse 84  - Temp 37 ??C (98.6 ??F)  - Ht 149.9 cm (4' 11)  - Wt 72.6 kg (160 lb)  - BMI 32.32 kg/m??   Wt Readings from Last 3 Encounters:   10/28/20 76.7 kg (169 lb)   06/25/20 74.4 kg (164 lb)   06/12/20 76 kg (167 lb 8.8 oz)     Const looks well and attentive, alert, appropriate   Eyes sclerae anicteric, noninjected OU   ENT Broken back left molar (top)   Lymph no cervical or supraclavicular LAD   CV RRR. No murmurs. No rub or gallop. S1/S2.   Lungs CTAB ant/post, normal work of breathing   GI Soft, no organomegaly. NTND. NABS.   GU deferred   Rectal deferred   Skin no petechiae, ecchymoses or rashes on full inspection. L buttock w large pedunculated skin tag.    MSK no joint tenderness and normal ROM throughout. No LE edema, feet warm with brisk DP/PT pulses.   Neuro CN II-XII grossly intact, MAEE, non focal   Psych Appropriate affect. Eye contact good. Linear thoughts. Fluent speech.     Laboratory Data  Reviewed in Epic today, using Synopsis and Chart Review filters.  05/06/21 Lab [name] - Study of Treatment and Reproductive Health Outcomes (STAR)   ?? STI screen negative (CT, GC from 3 sites, cervical trich)  ?? RPR non reactive  ?? HIV RNA <20  ?? CD4 1496 (52%)    Lab Results   Component Value Date    CREATININE 0.89 12/04/2020    QFTTBGOLD NEGATIVE 02/13/2014    HEPCAB Nonreactive 10/28/2020    CHOL 140 12/04/2020    HDL 50 12/04/2020    LDL 73 12/04/2020    NONHDL 90 12/04/2020    TRIG 93 12/04/2020    A1C 5.4 12/04/2020    PAP Negative for intraephithelial lesion or malignancy 03/29/2017    FINALDX  03/29/2019     A: Placenta, delivery    - Preterm (36 weeks 1 days EGA) singleton third trimester placenta, weight 320 g (weight 10th percentile for EGA)   - No acute chorioamnionitis identified   - Three vessel umbilical cord with narrow diameter (0.9 cm) and a furcate insertion  - Placental plate: accelerated maturation of chorionic villi,  increased intervillous and subchorionic fibrin, focal fetal thrombotic vasculopathy, small placental infarction (1.2 cm)        This electronic signature is attestation that the pathologist personally reviewed the submitted material(s) and the final diagnosis reflects that evaluation.              _____________________________________________________________________

## 2021-08-18 NOTE — Unmapped (Signed)
It was great to see you today.    The ID clinic phone number is [phone].  The ID clinic fax number is [phone].    Please note that your laboratory and other results may be visible to you in real time, possibly before they reach your provider. Please allow 48 hours for clinical interpretation of these results. Importantly, even if a result is flagged as abnormal, it may not be one that impacts your health.    For urgent issues on nights and weekends you may reach the ID Physician on call through the [facility] Operator at [phone].     URGENT CARE  Please call ahead to speak with the nursing staff if you are in need of an urgent appointment.       MEDICATIONS  For refills please contact your pharmacy and ask them to electronically send or fax the request to the clinic.   Please bring all medications in original bottles to every appointment.    HMAP (formerly ADAP) or [employer] Eligibility (required even if you do not receive medication through [facility])  Please remember to renew your Juanell Fairly eligibility during renewal periods which occur twice a year: January-March and July-September.     The following are needed for each renewal:   - [facility] Identification (if you don't have one, then a bill with your name and address in [place])   - proof of income (award letter, W-2, or last three check stubs)   If you are unable to come in for renewal, let us know if we can mail, fax or e-mail paperwork to you.   HMAP Contact: [phone].     Neldon Mc, MD  [facility] Infectious Diseases Clinic at [facility]  [address]   [place], [place] [address]  Phone: [phone]   Fax: [phone]     Lab info:  Your most recent CD4 T-cell counts and viral loads are below. Here are a few things to keep in mind when looking at your numbers:  Our goal is to get your virus to be undetectable and keep it undetectable. If the virus is undetectable you are much more likely to stay healthy.  We consider your viral load to be undetectable if it says <40 or if it says Not detected.  For most people, we're checking CD4 counts every other visit (once or twice a year, or sometimes even less).  It's normal for your CD4 count to be different from visit to visit.   You can help by taking your medications at about the same time, every single day. If you're having trouble with taking your medications, it's important to let us know.    From the STAR study visit July 2022:  HIV viral load <20  CD4 1496 (52%)    Lab Results   Component Value Date    ACD4 1,118 10/22/2018    CD4 54 10/22/2018    HIVCP <20 06/24/2020    HIVRS Not Detected 10/28/2020

## 2021-08-19 NOTE — Unmapped (Signed)
If your nausea persists even with the neurology treatment, you can try over the counter prilosec 10 mg, as needed for heart burn.

## 2021-08-20 LAB — HIV RNA, QUANTITATIVE, PCR: HIV RNA QNT RSLT: NOT DETECTED

## 2021-08-24 NOTE — Unmapped (Signed)
Called patient to inform her that her pay stubs were never sent and her application is still incomplete. Patient stated she will send the pay stubs soon.      Rhetta Mura  ID Clinic Benefits Counselor  Time of Intervention-53mins

## 2021-08-30 ENCOUNTER — Ambulatory Visit: Admit: 2021-08-30 | Discharge: 2021-08-31 | Payer: BLUE CROSS/BLUE SHIELD

## 2021-08-30 DIAGNOSIS — G932 Benign intracranial hypertension: Principal | ICD-10-CM

## 2021-08-30 NOTE — Unmapped (Signed)
Morgan Hebert is a 35 y.o. 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    CC: blurry vision and loss of periferal vision.    Timeline:   12/2013 - Dx IIH. Headache, nausea, vomiting, migraines  - on/off since 2015. Diamox on/off - highest dose - 1500 BID, there was slight imrpovement.  05/2016: Laset seen by Dr. Cheryll Cockayne who noted no optic disc edema. OCT 12/01/15 showed Thinning of NFL temporally both eyes,  Left  > right, HVF: OD - WNL; OS peripheral depression of VF, new since the last exam in 01/2014. Plan was for follow-up in 3 months but patient was lost to f/u.  03/29/19: Gave birth to child. Patient noticed slight peripheral vision loss and blurred vision OU.    LP: 11/13/2013 - openning pressure: 42, closing 20 cm H2O  01/13/14, 01/08/2016, 01/01/18 - tried LP, but was not successful  There was one more LP during delivery - 03/29/19     Past Medical History: none    06/18/19: Height 4.11, Gain 30 lb during pregnancy, 175 lb    Previous images:  Decreased OCT RNLF and GCC OS>OD     Today's Exam:  Images (June 18, 2019):      MRI brain 11/13/2013, Dr. Katrinka Blazing:   IMPRESSION:Unremarkable MRI of the brain.    Assessment and plan:   - Blurry vision and loss of periferal vision in a patient with longstanding IIH that was on and off Diamox. She delivered a baby on 03/29/19 and she was a on Diamox 500 mg TID. However, no improvement with headaches and her periferal vision is getting worse. Onset of IIH was on 12/2013 and she had 2 LP done, first one was in 2015 and OP was 42 cm H2O and CP was 20 cm H20.   - Today, 08/30/2021 all exams are stable comparing with visit 12/01/2015. Given this, will try to stop with Diamox for now. If any worsening of symptoms: blurry vision, whooshing sounds and HA, come back immediately.   - Every day migraine, taking multiple medication. Follow with neurologist.    - Last MRI done in 2015, so I am planning to repeat MRI brain and orbits w and w/o contrast  - Refer to bariatric team for loosing weight   - Follow up in 3 months   - I have spent more than 50% of the provider's face-to-face visit time with a patient is spent in counseling or coordination of care which is more then 35 minutes.   - I was present and involved in all parts of the service and the documentation is mine.

## 2021-09-01 NOTE — Unmapped (Signed)
[facility] Pharm[facility] Refill Coordination Note    Specialty Medication(s) to be Shipped:   Infectious Disease: Biktarvy    Other medication(s) to be shipped: No additional medications requested for fill at this time     Morgan Hebert, DOB: 1986/03/09  Phone: [phone] (home) [phone] (work)      All above HIPAA information was verified with patient.     Was a Nurse, learning disability used for this call? No    Completed refill call assessment today to schedule patient's medication shipment from the [facility] [phone]).  All relevant notes have been reviewed.     Specialty medication(s) and dose(s) confirmed: Regimen is correct and unchanged.   Changes to medications: Haili reports no changes at this time.  Changes to insurance: No  New side effects reported not previously addressed with a pharmacist or physician: None reported  Questions for the pharmacist: No    Confirmed patient received a Conservation officer, historic buildings and a Surveyor, mining with first shipment. The patient will receive a drug information handout for each medication shipped and additional FDA Medication Guides as required.       DISEASE/MEDICATION-SPECIFIC INFORMATION        N/A    SPECIALTY MEDICATION ADHERENCE     Medication Adherence    Patient reported X missed doses in the last month: 0  Specialty Medication: BIKTARVY  Patient is on additional specialty medications: No              Were doses missed due to medication being on hold? No    Biktarvy 50-200-25 mg: 10-11 days of medicine on hand        REFERRAL TO PHARMACIST     Referral to the pharmacist: Not needed      [facility]     Shipping address confirmed in Epic.     Delivery Scheduled: Yes, Expected medication delivery date: 09/10/21.     Medication will be delivered via Next Day Courier to the prescription address in Epic WAM.    Unk Lightning   [facility] Specialty Technician

## 2021-10-07 NOTE — Unmapped (Signed)
[facility] Pharm[facility] Refill Coordination Note    Specialty Medication(s) to be Shipped:   Infectious Disease: Biktarvy    Other medication(s) to be shipped: acetazolamide  prochlorperazine     Morgan Hebert, DOB: 09-Mar-1986  Phone: [phone] (home) [phone] (work)      All above HIPAA information was verified with patient.     Was a Nurse, learning disability used for this call? No    Completed refill call assessment today to schedule patient's medication shipment from the [facility] [phone]).  All relevant notes have been reviewed.     Specialty medication(s) and dose(s) confirmed: Regimen is correct and unchanged.   Changes to medications: Morgan Hebert reports no changes at this time.  Changes to insurance: No  New side effects reported not previously addressed with a pharmacist or physician: None reported  Questions for the pharmacist: No    Confirmed patient received a Conservation officer, historic buildings and a Surveyor, mining with first shipment. The patient will receive a drug information handout for each medication shipped and additional FDA Medication Guides as required.       DISEASE/MEDICATION-SPECIFIC INFORMATION        N/A    SPECIALTY MEDICATION ADHERENCE     Medication Adherence    Patient reported X missed doses in the last month: 0  Specialty Medication: BIKTARVY              Were doses missed due to medication being on hold? No    biktarvy  : 10 days of medicine on hand       REFERRAL TO PHARMACIST     Referral to the pharmacist: Not needed      [facility]     Shipping address confirmed in Epic.     Delivery Scheduled: Yes, Expected medication delivery date: 1/5.     Medication will be delivered via Next Day Courier to the prescription address in Epic WAM.    Morgan Hebert   [facility] Specialty Technician

## 2021-10-13 MED FILL — BIKTARVY 50 MG-200 MG-25 MG TABLET: ORAL | 30 days supply | Qty: 30 | Fill #1

## 2021-11-05 NOTE — Unmapped (Signed)
[facility] Refill Coordination Note    Specialty Medication(s) to be Shipped:   Infectious Disease: Biktarvy    Other medication(s) to be shipped: azetazolamide  Prochlorperazine       Morgan Hebert, DOB: 1986/05/26  Phone: [phone] (home) [phone] (work)      All above HIPAA information was verified with patient.     Was a Nurse, learning disability used for this call? No    Completed refill call assessment today to schedule patient's medication shipment from the [facility] [phone]).  All relevant notes have been reviewed.     Specialty medication(s) and dose(s) confirmed: Regimen is correct and unchanged.   Changes to medications: Morgan Hebert reports no changes at this time.  Changes to insurance: No  New side effects reported not previously addressed with a pharmacist or physician: None reported  Questions for the pharmacist: No    Confirmed patient received a Conservation officer, historic buildings and a Surveyor, mining with first shipment. The patient will receive a drug information handout for each medication shipped and additional FDA Medication Guides as required.       DISEASE/MEDICATION-SPECIFIC INFORMATION        N/A    SPECIALTY MEDICATION ADHERENCE     Medication Adherence    Patient reported X missed doses in the last month: 0  Specialty Medication: biktarvy              Were doses missed due to medication being on hold? No    biktarvy  : 7 days of medicine on hand       REFERRAL TO PHARMACIST     Referral to the pharmacist: Not needed      [facility]     Shipping address confirmed in Epic.     Delivery Scheduled: Yes, Expected medication delivery date: 2/1.     Medication will be delivered via Next Day Courier to the prescription address in Epic WAM.    Morgan Hebert   [facility] Specialty Technician

## 2021-11-09 MED FILL — PROCHLORPERAZINE MALEATE 10 MG TABLET: ORAL | 15 days supply | Qty: 30 | Fill #1

## 2021-11-09 MED FILL — BIKTARVY 50 MG-200 MG-25 MG TABLET: ORAL | 30 days supply | Qty: 30 | Fill #2

## 2021-11-09 MED FILL — ACETAZOLAMIDE ER 500 MG CAPSULE,EXTENDED RELEASE: ORAL | 30 days supply | Qty: 120 | Fill #1

## 2021-11-30 NOTE — Unmapped (Deleted)
Neurology Follow-up Visit Note     [facility]  [address]  [address]  [place] [address]     Date: 11/30/2021   Patient Name: Morgan Hebert   MRN: [id number]   PCP: Jacquiline Doe  Referring Provider: Artelia Laroche, MD       Assessment and Plan          [name] is a 36 y.o. female presenting for follow-up of pseudotumor cerebri.     Pseudotumor Cerebri  Diagnosed in 2019 after 1 year of HA that progressed to include visual changes. Opening pressures on LP have been as high as 40. She has not required LP since 2020 and was maintained on Diamox only however she continued to have some headaches despite Diamox dosing as high as 1500 mg BID (this dose was increased from 1000 BID in the setting of pregnancy in 2020). She presents today with worsened HA and 1 month of persistent AM N/V in the setting of being off therapy for 2 months. We will restart Diamox today and provide prescription for 3 months of Compazine (she reports Zofran stopped working but she tried a friend's Compazine with good results). Advised that she get formal eye exam as soon as possible due to mild R-sided papilledema on today's clinic exam. She is not reporting vision changes. Given report that she has continued to have HA pain despite high dose Diamox (which did control N/V and reduce HA) she may need additional Migraine preventative therapy if this continues after restarting Diamox. Advised patient to contact me 1 month after restarting Diamox, at which point we can add on Migraine agent if indicated.  - Restart Diamox 1000 BID  - Compazine 10 mg BID PRN (ordered EKG to be done at PCP office this afternoon)  - Advised patient to schedule formal ophthalmology exam  - Avoid Tylenol and NSAIDs >3 days per week to reduce risk of medication overuse HA  - If requiring daily Tylenol and NSAIDs after being on Diamox for 1 month, consider addition of Nortriptyline for migraine prevention     Return Visit in: 3 months    I personally spent 60 minutes face-to-face and non-face-to-face in the care of this patient, which includes all pre, intra, and post visit time on the date of service.  {    Coding tips - Do not edit this text, it will delete upon signing of note!    ?? Telephone visits [phone] for Physicians and APP??s and [phone] for Non- Physician Clinicians)- Only use minutes on the phone to determine level of service.    ?? Video visits [phone]) - Use both minutes on video and pre/post minutes to determine level of service.       :[id number]}      Patient was seen and discussed with Dr.Traub who agrees with assessment and plan.    Alwyn Pea, MD   Neurology, PGY-2    ATTESTATION NOTE:  I saw and evaluated the patient, participating in the key portions of the service.  I reviewed the resident???s note and agree with the resident???s findings and plan as documented in their note.    IIH - worsening headache since stopping diamox and mild papilledema on exam today  Some component of chronic headache from superimposed migraine  Plan to restart diamox, ophtho follow up and then consider adding a tricyclic for migraine    Jamelle Rushing, MD  Associate Professor  Department of Neurology  [facility] [employer] [place]           HPI         HPI: Morgan Hebert is a 36 y.o. female who presents to the [facility] for follow-up of pseudotumor cerebri. Patient was last seen by [facility] on 05/15/2019.      To review their relevant history:    Diagnosed with migraines prior to 2018. Developed vision changes in 2019 which prompted evaluation for possible IIH and diagnosis of pseudotumor cerebri. She estimates she has had a total of about 10 LPs with the last being in 2020 at the time of delivery of her last child. Diamox was increased during her pregnancy due to weight gain and worsening of symptoms.     Today, her biggest issue is nausea and vomiting. She is throwing up each morning for a month now. Of note, she got COVID in September and noticed a worsening of her HA at that time. This has not resolved. Prior to COVID, symptoms had been pretty stable since 2020 however she has been taking daily Tylenol and Ibuprofen for persistent mild HA. She has used Zofran for intermittent N/V however this has not been effective for the last month. She tried Prochlorperazine 10mg  BID and has been taking this for one week with good effect.     Has lost 10 pounds in 2 weeks due to vomiting. Feels she is not able to maintain adequate hydration. She is having labs collected this afternoon at PCP's office.    HA is bilateral at base of neck with pressure behind eyes. She does experience light and sound sensitivity with HA. Hot showers help.     No balance changes or difficulties walking. No vision changes today or recently.         Allergies   Allergen Reactions   ??? 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        Current Outpatient Medications   Medication Sig Dispense Refill   ??? acetaminophen (TYLENOL) 325 MG tablet Take 2 tablets (650 mg total) by mouth Every six (6) hours. 120 tablet 0   ??? acetaZOLAMIDE (DIAMOX) 500 mg ER 12 hr capsule Take 2 capsules (1,000 mg total) by mouth Two (2) times a day. 120 capsule 11   ??? bictegrav-emtricit-tenofov ala (BIKTARVY) 50-200-25 mg tablet Take 1 tablet by mouth daily. 30 tablet 11   ??? BINAXNOW COVID-19 AG SELF TEST Kit TEST AS DIRECTED TODAY     ??? ibuprofen (MOTRIN) 600 MG tablet Take 1 tablet (600 mg total) by mouth Every six (6) hours. 60 tablet 0     No current facility-administered medications for this visit.       Past Medical History:   Diagnosis Date   ??? Abnormal mammogram    ??? Constipation    ??? Diarrhea    ??? 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 Environmental allergies    ??? GDM- diet controlled 03/07/2019   ??? HIV (human immunodeficiency virus infection) (CMS-HCC)    ???  HIV (human immunodeficiency virus infection) (CMS-HCC)    ??? IIH (idiopathic intracranial hypertension)    ??? IUD (intrauterine device) in place    ??? Lymphadenopathy 02/11/2014   ??? Migraine    ??? Obesity    ??? Pseudotumor cerebri    ??? Rubella non-immune status, antepartum 01/14/2019    [ ]  PP MMR   ??? STD (sexually transmitted disease)    ??? Supervision of high risk pregnancy, unspecified, unspecified trimester 05/30/2018    Dating: 9/21 (Unsure of LMP as had a D&C for a missed AB in 06/2018 and discovered she was pregnant in 07/2018)                                    Prenatal Screening:  [x]  Prenatal labs reviewed [x]  SMA 2 copies/Hgb electropheresis wnl/CF [ ]   [x]  1 hr GTT= 250 but done 3 days after BMZ (4/22 and 4/23) --> Repeat 1 hr GTT  5-18= 229  [ ]  28 wk CBC/RPR [ ]  GBS   Fetal Screening:  [x]  aneuploidy scre   ??? Threatened premature labor in third trimester 01/31/2019    Received BMZ 4/22 ()-4/23 [facility]) for concern for PTL. SVE cl/th/hi.       Past Surgical History:   Procedure Laterality Date   ??? DILATION AND CURETTAGE OF UTERUS     ??? LUMBAR PUNCTURE     ??? PR CESAREAN DELIVERY ONLY N/A 03/29/2019    Procedure: CESAREAN DELIVERY ONLY;  Surgeon: Asher Muir, MD;  Location: L&D C-SECTION OR SUITES [facility];  Service: Maternal-Fetal Medicine   ??? PR COLONOSCOPY W/BIOPSY SINGLE/MULTIPLE  07/03/2014    Procedure: COLONOSCOPY, FLEXIBLE, PROXIMAL TO SPLENIC FLEXURE; WITH BIOPSY, SINGLE OR MULTIPLE;  Surgeon: Billie Ruddy, MD;  Location: GI PROCEDURES [facility];  Service: Gastroenterology   ??? PR DILATION/CURETTAGE,DIAGNOSTIC N/A 06/14/2018    Procedure: DILATION AND CURETTAGE, DIAGNOSTIC AND/OR THERAPEUTIC (NON OBSTETRICAL);  Surgeon: Nelle Don, MD;  [facility] OR [facility];  Service: [facility] Primary Gynecology   ??? PR LAP,RMV  ADNEXAL STRUCTURE Bilateral 06/04/2019    Procedure: R21  LAPAROSCOPY, SURGICAL; W/REMOVAL OF ADNEXAL STRUCTURES (PARTIAL OR TOTAL OOPHORECTOMY &/OR SALPINGECTOMY);  Surgeon: Joneen Caraway, MD;  Location: [facility];  Service: Family Planning   ??? PR SIGMOIDOSCOPY FLX DX W/COLLJ SPEC BR/WA IF PFRMD N/A 06/25/2020    Procedure: SIGMOIDOSCOPY, FLEXIBLE; DIAGNOSTIC, WITH OR WITHOUT COLLECTION OF SPECIMEN(S) BY BRUSHING OR WASHING;  Surgeon: Bronson Curb, MD;  Location: [facility] PROCEDURES [facility];  Service: Gastroenterology   ??? PR UPPER GI ENDOSCOPY,BIOPSY N/A 07/03/2014    Procedure: UGI ENDOSCOPY; WITH BIOPSY, SINGLE OR MULTIPLE;  Surgeon: Billie Ruddy, MD;  Location: [facility] PROCEDURES [facility];  Service: Gastroenterology   ??? WISDOM TOOTH EXTRACTION         Social History     Socioeconomic History   ??? Marital status: Married     Spouse name: Loraine Leriche    ??? Number of children: 2   Tobacco Use   ??? Smoking status: Former     Types: Cigarettes     Quit date: 03/10/2021     Years since quitting: 0.7   ??? Smokeless tobacco: Never   Vaping Use   ??? Vaping Use: Never used   Substance and Sexual Activity   ??? Alcohol use: Yes     Alcohol/week: 2.0 standard drinks  Types: 1 Shots of liquor, 1 Glasses of wine per week     Comment: rare   ??? Drug use: Yes     Frequency: 7.0 times per week     Types: Marijuana     Comment:  for migraines   ??? Sexual activity: Yes     Partners: Male   Social History Narrative    Patient lives in [place], [place] with her Husband and two daughters..  She is not employed.       Family History   Problem Relation Age of Onset   ??? Cervical cancer Mother    ??? Diabetes Mother    ??? Asthma Mother    ??? Hypertension Mother    ??? Diabetes Maternal Aunt    ??? Hypertension Maternal Aunt    ??? Breast cancer Maternal Grandmother    ??? Cancer Maternal Grandmother    ??? No Known Problems Daughter    ??? Glaucoma Neg Hx             Objective        Vital signs: There were no vitals taken for this visit.       Physical Exam:  General Appearance:Well appearing. In no acute distress.  HEENT: Head is atraumatic and normocephalic. Sclera anicteric without injection. Oropharyngeal membranes are moist with no erythema or exudate.  Neck: Supple.  Lungs: normal WOB on RA  Heart: Radial pulses 2+ bilaterally.  Abdomen: Nondistended.  Extremities: No clubbing, cyanosis, or edema.    Neurological Examination:     Mental Status: Alert, conversant, able to follow conversation and interview. Spontaneous speech was fluent without word finding pauses, dysarthria, or paraphasic errors. Comprehension was intact to simple and multi-step commands. Memory for recent and remote events was intact. Affect was full range and appropriate to context.    Cranial Nerves: Visual fields intact to direct confrontation. Fundoscopic exam reveals mild R sided papilledema present. PERRL 4 mm. Pursuit eye movements were uninterrupted with full range and without more than end-gaze nystagmus. Facial sensation intact bilaterally to light touch on the forehead, cheek, and chin. Face symmetric at rest. Normal facial movement bilaterally, including forehead, eye closure and grimace/smile. Hearing intact to conversation. Shoulder shrug full strength bilaterally. Palate movement is symmetric. Tongue protrudes midline and tongue movements are normal.    Motor Exam: Normal bulk.  Normal tone in the upper and lower extremities.  No tremors, myoclonus, or other adventitious movement.  Pronator drift is absent.  UE R/L: deltoid 5/5, biceps 5/5, triceps 5/5, wrist flexion 5/5, wrist extension 5/5, finger spread 5/5 and hand grip strong/strong.  LE R/L: hip flexion 5/5, hip extension 5/5, quadriceps 5/5, hamstrings 5/5, dorsiflexion 5/5 and plantar flexion 5/5.  Fine finger movements and foot taps had normal velocity and amplitude with no breakdown.      Reflexes:   R L   Biceps +2 +2   Brachioradialis +2 +2   Triceps +2 +2   Patella +2 +2   Achilles +2 +2       Sensory: Sensation normal to light touch and temperature sensation to cold in both hands and both feet and to position sense and vibration distally in the fingers and toes. Romberg signnegative.    Cerebellar/Coordination/Gait: Rapid alternating movements are normal in bilateral upper extremities. Finger-to-nose is normal without ataxia or dysmetria bilaterally. Heel-to-shin is normal without ataxia or dysmetria bilaterally. Gait exam demonstrates normal posture, base, stride length, arm swing and turns.

## 2021-12-03 NOTE — Unmapped (Signed)
[facility] Clinical Assessment & Refill Coordination Note    Morgan Hebert, DOB: January 02, 1986  Phone: [phone] (home) [phone] (work)    All above HIPAA information was verified with patient.     Was a Nurse, learning disability used for this call? No    Specialty Medication(s):   Infectious Disease: Biktarvy     Current Outpatient Medications   Medication Sig Dispense Refill   ??? acetaminophen (TYLENOL) 325 MG tablet Take 2 tablets (650 mg total) by mouth Every six (6) hours. 120 tablet 0   ??? acetaZOLAMIDE (DIAMOX) 500 mg ER 12 hr capsule Take 2 capsules (1,000 mg total) by mouth Two (2) times a day. 120 capsule 11   ??? bictegrav-emtricit-tenofov ala (BIKTARVY) 50-200-25 mg tablet Take 1 tablet by mouth daily. 30 tablet 11   ??? BINAXNOW COVID-19 AG SELF TEST Kit TEST AS DIRECTED TODAY     ??? ibuprofen (MOTRIN) 600 MG tablet Take 1 tablet (600 mg total) by mouth Every six (6) hours. 60 tablet 0   ??? prochlorperazine (COMPAZINE) 10 MG tablet Take 1 tablet (10 mg total) by mouth two (2) times a day as needed for nausea. 30 tablet 2     No current facility-administered medications for this visit.        Changes to medications: Morgan Hebert reports no changes at this time.    Allergies   Allergen Reactions   ??? 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       Changes to allergies: No    SPECIALTY MEDICATION ADHERENCE     Biktarvy 50-200-25 mg: 10 to 12 days of medicine on hand     Medication Adherence    Patient reported X missed doses in the last month: 0  Specialty Medication: Biktarvy 50-200-25mg   Patient is on additional specialty medications: No  Any gaps in refill history greater than 2 weeks in the last 3 months: no  Demonstrates understanding of importance of adherence: yes  Informant: patient  Provider-estimated medication adherence level: good  Patient is at risk for Non-Adherence: No  Confirmed plan for next specialty medication refill: delivery by pharmacy  Refills needed for supportive medications: yes, ordered or provider notified          Specialty medication(s) dose(s) confirmed: Regimen is correct and unchanged.     Are there any concerns with adherence? No    Adherence counseling provided? Not needed    CLINICAL MANAGEMENT AND INTERVENTION      Clinical Benefit Assessment:    Do you feel the medicine is effective or helping your condition? Yes    HIV ASSOCIATED LABS:     Lab Results   Component Value Date/Time    HIVRS Not Detected 08/18/2021 05:05 PM    HIVRS Not Detected 10/28/2020 10:17 AM    HIVRS Detected (A) 02/25/2019 10:12 AM    HIVRS 42,400 10/23/2017 12:00 AM    HIVRS not detected 03/29/2017 12:00 AM    HIVRS detected 10/25/2016 12:00 AM    HIVCP <20 06/24/2020 08:23 AM    HIVCP <20 06/12/2020 11:43 AM    HIVCP <40 (H) 02/25/2019 10:12 AM    HIVCP <40 (H) 07/04/2018 02:06 PM    HIVCP detected 10/23/2017 12:00 AM    RCD4 54.4 05/01/2015 12:00 AM    RCD4 53.7 10/24/2014 12:00 AM    ACD4 1,118 10/22/2018 09:45 AM    ACD4 1,696 (A) 05/01/2015 12:00 AM  ACD4 967 10/24/2014 12:00 AM    ACD4 854 06/23/2014 12:26 PM       Clinical Benefit counseling provided? Labs from 08/18/21 show evidence of clinical benefit    Adverse Effects Assessment:    Are you experiencing any side effects? No    Are you experiencing difficulty administering your medicine? No    Quality of Life Assessment:    How many days over the past month did your HIV  keep you from your normal activities? For example, brushing your teeth or getting up in the morning. 0    Have you discussed this with your provider? Not needed    Acute Infection Status:    Acute infections noted within Epic:  No active infections  Patient reported infection: None    Therapy Appropriateness:    Is therapy appropriate and patient progressing towards therapeutic goals? Yes, therapy is appropriate and should be continued    DISEASE/MEDICATION-SPECIFIC INFORMATION      N/A    PATIENT SPECIFIC NEEDS     - Does the patient have any physical, cognitive, or cultural barriers? No    - Is the patient high risk? No    - Does the patient require a Care Management Plan? No         SHIPPING     Specialty Medication(s) to be Shipped:   Infectious Disease: Biktarvy    Other medication(s) to be shipped: prochlorperazine and acetazolamide     Changes to insurance: No    Delivery Scheduled: Yes, Expected medication delivery date: 12/06/21.     Medication will be delivered via Same Day Courier to the confirmed prescription address in [facility].    The patient will receive a drug information handout for each medication shipped and additional FDA Medication Guides as required.  Verified that patient has previously received a Conservation officer, historic buildings and a Surveyor, mining.    The patient or caregiver noted above participated in the development of this care plan and knows that they can request review of or adjustments to the care plan at any time.      All of the patient's questions and concerns have been addressed.    Roderic Palau   [facility] Specialty Pharmacist

## 2021-12-06 MED FILL — ACETAZOLAMIDE ER 500 MG CAPSULE,EXTENDED RELEASE: ORAL | 30 days supply | Qty: 120 | Fill #2

## 2021-12-06 MED FILL — PROCHLORPERAZINE MALEATE 10 MG TABLET: ORAL | 15 days supply | Qty: 30 | Fill #2

## 2021-12-06 MED FILL — BIKTARVY 50 MG-200 MG-25 MG TABLET: ORAL | 30 days supply | Qty: 30 | Fill #3

## 2021-12-07 MED ORDER — PROCHLORPERAZINE MALEATE 10 MG TABLET
ORAL_TABLET | Freq: Two times a day (BID) | ORAL | 9 refills | 15 days | PRN
Start: 2021-12-07 — End: 2022-05-06

## 2021-12-07 NOTE — Unmapped (Signed)
Last Visit Date: 08/18/2021  Next Visit Date: Visit date not found    No results found for: CBC, CMP     No results found for this or any previous visit.

## 2021-12-14 NOTE — Unmapped (Signed)
Morgan Hebert is a 36 y.o. female here for a MWCCS Study Visit (IRB [phone]) on 12/14/2021. HIV positive participant. Denies complaints today. Pt is engaged in care at [facility], takes [employer] for ARVs.     Significant Medical History: HIV  Medications: OTC tylenol PRN, OTC benadryl PRN, diamox     Negative COVID-19 Screen: Wellness screening and temperature check conducted at clinic door. Participant reported no COVID-19 symptoms, no contact to a known or suspected COVID-19 case in the last 14 days, and temperature was within normal limits. Participant was cleared to enter the facility.     Study visit included:    Pregnancy Test: not indicated, pt has BTL, current menses    Physical Exam:  Blood Pressure, Height/Weight, In-Body Analysis  and BIA Body Measurements      Labs Collected: CMP, CBC w/diff, CD4, HIV Viral Load, fasting glucose, HgA1C, fasting lipids, RPR cascade. Urine: GC/CH  Pt accepted a stool collection kit.     All labs results will be documented in Epic.

## 2021-12-17 NOTE — Unmapped (Signed)
Spoke to patient and reviewed labs   CD4 1987   HIV VL <20   RPR Neg  Gc/CH neg   GFR/LFTs WNL   a1c 5.5  Lipids: TG 166 (had coffee w/sugar), HDL 39    Pt requested clinical advice regarding bilateral swollen lymph nodes of groin. Denies fevers, chills, vomiting, weight loss, pain, warmth and lymphadenopathy of other areas. STD testing neg. Monogamous sexually w/ one female partner. Advised PCP follow up, sooner if symptoms worsening. Pt had no further concerns or questions.

## 2022-01-07 NOTE — Unmapped (Signed)
[facility] Specialty Pharmacy Refill Coordination Note    Specialty Medication(s) to be Shipped:   Infectious Disease: Biktarvy    Other medication(s) to be shipped: acetazolamide     Morgan Hebert, DOB: [birth year]  Phone: [phone] (home) [phone] (work)      All above HIPAA information was verified with patient.     Was a Nurse, learning disability used for this call? No    Completed refill call assessment today to schedule patient's medication shipment from the [facility] [phone]).  All relevant notes have been reviewed.     Specialty medication(s) and dose(s) confirmed: Regimen is correct and unchanged.   Changes to medications: Morgan Hebert reports no changes at this time.  Changes to insurance: No  New side effects reported not previously addressed with a pharmacist or physician: None reported  Questions for the pharmacist: No    Confirmed patient received a Conservation officer, historic buildings and a Surveyor, mining with first shipment. The patient will receive a drug information handout for each medication shipped and additional FDA Medication Guides as required.       DISEASE/MEDICATION-SPECIFIC INFORMATION        N/A    SPECIALTY MEDICATION ADHERENCE     Medication Adherence    Patient reported X missed doses in the last month: 0  Specialty Medication: Biktarvy              Were doses missed due to medication being on hold? No    biktarvy  : 10 days of medicine on hand       REFERRAL TO PHARMACIST     Referral to the pharmacist: Not needed      [facility]     Shipping address confirmed in Epic.     Delivery Scheduled: Yes, Expected medication delivery date: 4/6.     Medication will be delivered via Next Day Courier to the prescription address in Epic W[facility].    [name]   [facility] Specialty Technician

## 2022-01-12 MED FILL — BIKTARVY 50 MG-200 MG-25 MG TABLET: ORAL | 30 days supply | Qty: 30 | Fill #4

## 2022-01-12 MED FILL — ACETAZOLAMIDE ER 500 MG CAPSULE,EXTENDED RELEASE: ORAL | 30 days supply | Qty: 120 | Fill #3

## 2022-02-04 NOTE — Unmapped (Signed)
[facility] Specialty Pharmacy Refill Coordination Note    Specialty Medication(s) to be Shipped:   Infectious Disease: Biktarvy    Other medication(s) to be shipped:  acetazolamide 500 mg ER 12 hr capsule (DIAMOX)     Morgan Hebert, DOB: 1986-08-28  Phone: [phone] (home) [phone] (work)      All above HIPAA information was verified with patient.     Was a Nurse, learning disability used for this call? No    Completed refill call assessment today to schedule patient's medication shipment from the [facility] [phone]).  All relevant notes have been reviewed.     Specialty medication(s) and dose(s) confirmed: Regimen is correct and unchanged.   Changes to medications: Militza reports no changes at this time.  Changes to insurance: No  New side effects reported not previously addressed with a pharmacist or physician: None reported  Questions for the pharmacist: No    Confirmed patient received a Conservation officer, historic buildings and a Surveyor, mining with first shipment. The patient will receive a drug information handout for each medication shipped and additional FDA Medication Guides as required.       DISEASE/MEDICATION-SPECIFIC INFORMATION        N/A    SPECIALTY MEDICATION ADHERENCE     Medication Adherence    Specialty Medication: BIKTARVY              Were doses missed due to medication being on hold? No    Biktarvy 50-200-25 mg: 10 days of medicine on hand        REFERRAL TO PHARMACIST     Referral to the pharmacist: Not needed      [facility]     Shipping address confirmed in Epic.     Delivery Scheduled: Yes, Expected medication delivery date: 02/10/22.     Medication will be delivered via Next Day Courier to the prescription address in Epic WAM.    Unk Lightning   [facility] Specialty Technician

## 2022-02-09 MED FILL — BIKTARVY 50 MG-200 MG-25 MG TABLET: ORAL | 30 days supply | Qty: 30 | Fill #5

## 2022-02-09 MED FILL — ACETAZOLAMIDE ER 500 MG CAPSULE,EXTENDED RELEASE: ORAL | 30 days supply | Qty: 120 | Fill #4

## 2022-03-01 ENCOUNTER — Ambulatory Visit: Admit: 2022-03-01 | Payer: BLUE CROSS/BLUE SHIELD

## 2022-03-01 NOTE — Unmapped (Incomplete)
Internal Medicine Clinic Visit    Reason for visit: ***    A/P:    ***  History of diet controlled gestational diabetes mellitus (GDM)  Overview:  08/18/2021  Check A1C  Addendum: normal  Lab Results   Component Value Date    A1C 5.0 08/18/2021           HIV  Overview:  - Diagnosed in 02/2014 when patient presented to the ED with adenopathy. Initial CD4 644, HIV RNA 11,000  -Followed by Dr Rosemarie Beath.   Lives with husband who is HIV neg. He is aware of her status  On Biktarvy  Lab Results   Component Value Date    ACD4 1,118 10/22/2018    CD4 54 10/22/2018    HIVCP <20 06/24/2020    HIVRS Not Detected 08/18/2021               Health Maintenance:   Health Maintenance   Topic Date Due    COVID-19 Vaccine (2 - Pfizer risk series) 11/18/2020    HPV Cotest with Pap Smear (21-65)  08/25/2026    Pap Smear with Cotest HPV (21-65)  08/27/2026    DTaP/Tdap/Td Vaccines (2 - Td or Tdap) 02/04/2029    Pneumococcal Vaccine 0-64  Completed    Hepatitis C Screen  Completed    Influenza Vaccine  Completed         No follow-ups on file.  Next Visit:   ***  __________________________________________________________    HPI:  Pt has pseudotumor cerebri, obesity , and HIV.   Headaches?   Exercise? .      Lab Results   Component Value Date    ACD4 1,118 10/22/2018    CD4 54 10/22/2018    HIVCP <20 06/24/2020    HIVRS Not Detected 08/18/2021       __________________________________________________________    Problem List:  Patient Active Problem List   Diagnosis    Pseudotumor cerebri    HIV    History of diet controlled gestational diabetes mellitus (GDM)    Nausea       Medications:  Reviewed in EPIC    _________________________________________________________    Physical Exam:   Vital Signs:  There were no vitals filed for this visit.  Wt Readings from Last 3 Encounters:   08/18/21 72.7 kg (160 lb 4.4 oz)   08/18/21 72.7 kg (160 lb 3.2 oz)   08/18/21 72.6 kg (160 lb)     Gen: Well appearing, NAD  CV: RRR, no murmurs  Pulm: CTA bilaterally, no crackles or wheezes  Abd: Soft, NTND, normal BS. No HSM.  Ext: No edema  MSK:***  Psych:***  ***  Records review***  Last   Lab Results   Component Value Date    CREATININE 0.90 12/14/2021    CHOL 164 12/14/2021    HDL 39 (L) 12/14/2021    LDL 96 12/14/2021    NONHDL 125 12/14/2021    TRIG 166 (H) 12/14/2021    A1C 5.5 12/14/2021    PAP Negative for intraephithelial lesion or malignancy 03/29/2017        The ASCVD Risk score (Arnett DK, et al., 2019) failed to calculate.   Medication adherence and barriers to the treatment plan have been addressed. Opportunities to optimize healthy behaviors have been discussed. Patient / caregiver voiced understanding.    {TIP - HCC- RAFF Pilot- Clinical Documentation Specialist Recommendations-  No specialty comments available.   This text will self delete upon signing [id number]}  I personally spent *** minutes face-to-face and non-face-to-face in the care of this patient, which includes all pre, intra, and post visit time on the date of service.

## 2022-03-04 MED ORDER — PROCHLORPERAZINE MALEATE 10 MG TABLET
ORAL_TABLET | Freq: Two times a day (BID) | ORAL | 1 refills | 15 days | Status: CP | PRN
Start: 2022-03-04 — End: 2022-04-03
  Filled 2022-03-08: qty 30, 15d supply, fill #0

## 2022-03-04 NOTE — Unmapped (Signed)
[facility] Refill Coordination Note    Specialty Medication(s) to be Shipped:   Infectious Disease: Biktarvy    Other medication(s) to be shipped: acetazolomide 500mg      Morgan Hebert, DOB: 29-Nov-1985  Phone: [phone] (home) [phone] (work)      All above HIPAA information was verified with patient.     Was a Nurse, learning disability used for this call? No    Completed refill call assessment today to schedule patient's medication shipment from the [facility] [phone]).  All relevant notes have been reviewed.     Specialty medication(s) and dose(s) confirmed: Regimen is correct and unchanged.   Changes to medications: Kymiah reports no changes at this time.  Changes to insurance: No  New side effects reported not previously addressed with a pharmacist or physician: None reported  Questions for the pharmacist: No    Confirmed patient received a Conservation officer, historic buildings and a Surveyor, mining with first shipment. The patient will receive a drug information handout for each medication shipped and additional FDA Medication Guides as required.       DISEASE/MEDICATION-SPECIFIC INFORMATION        N/A    SPECIALTY MEDICATION ADHERENCE     Medication Adherence    Patient reported X missed doses in the last month: 0  Specialty Medication: Biktarvy 50-200-25mg   Patient is on additional specialty medications: No  Patient is on more than two specialty medications: No  Any gaps in refill history greater than 2 weeks in the last 3 months: no  Demonstrates understanding of importance of adherence: yes  Informant: patient  Reliability of informant: reliable  Provider-estimated medication adherence level: good  Patient is at risk for Non-Adherence: No  Reasons for non-adherence: no problems identified  Confirmed plan for next specialty medication refill: delivery by pharmacy  Refills needed for supportive medications: not needed          Refill Coordination    Has the Patients' Contact Information Changed: No  Is the Shipping Address Different: No         Were doses missed due to medication being on hold? No    biktarvy 50-200-25   mg: 8 days of medicine on hand         REFERRAL TO PHARMACIST     Referral to the pharmacist: Not needed      [facility]     Shipping address confirmed in Epic.     Delivery Scheduled: Yes, Expected medication delivery date: 05/31.     Medication will be delivered via UPS to the prescription address in Epic WAM.    Antonietta Barcelona   [facility] Specialty Technician

## 2022-03-08 MED FILL — ACETAZOLAMIDE ER 500 MG CAPSULE,EXTENDED RELEASE: ORAL | 30 days supply | Qty: 120 | Fill #5

## 2022-03-09 ENCOUNTER — Ambulatory Visit: Admit: 2022-03-09 | Discharge: 2022-03-10 | Payer: BLUE CROSS/BLUE SHIELD

## 2022-03-09 DIAGNOSIS — B20 Human immunodeficiency virus [HIV] disease: Principal | ICD-10-CM

## 2022-03-09 DIAGNOSIS — R519 Nonintractable episodic headache, unspecified headache type: Principal | ICD-10-CM

## 2022-03-09 DIAGNOSIS — G932 Benign intracranial hypertension: Principal | ICD-10-CM

## 2022-03-09 DIAGNOSIS — R635 Abnormal weight gain: Principal | ICD-10-CM

## 2022-03-09 MED ORDER — BIKTARVY 50 MG-200 MG-25 MG TABLET
ORAL_TABLET | Freq: Every day | ORAL | 11 refills | 30 days | Status: CP
Start: 2022-03-09 — End: 2023-03-09
  Filled 2022-03-08: qty 30, 30d supply, fill #6
  Filled 2022-04-05: qty 30, 30d supply, fill #0

## 2022-03-09 NOTE — Unmapped (Signed)
r     INFECTIOUS DISEASES CLINIC  [address]  [place], [place]  [address]  P [phone]  F [phone]     Primary care provider: Jacquiline Doe, MD    Assessment/Plan:      Morgan Hebert, a 36 y.o. female seen today for routine HIV followup.    Plan:  HIV  Fills ART via private insurance. RW renewal needed for this period (caps on charges, to meet with benefits counselor today).  Continue current therapy. E-prescribed today. She uses [facility].  Labs drawn through study 12/2021    Encouraged continued excellent ARV adherence.   Lab Results   Component Value Date    ACD4 1,118 10/22/2018    CD4 54 10/22/2018    HIVCP <20 06/24/2020    HIVRS Not Detected 08/18/2021   12/14/21  CD4 1987 (55%)  HIV VL <20   RPR Neg  Gc/CH neg   GFR/LFTs WNL   a1c 5.5  Lipids: TG 166 (had coffee w/sugar), HDL 39     Pseudotumor cerebri, headaches  Sees [facility], appt with [name] and Dr. Hampton Abbot in the coming month. Her symptoms come and go. Severity of headaches is unchanged.   We have discussed medication use and possible role of marijuana in exacerbating her headaches and nausea (the latter is under better control lately).   For ophtho followup with Dr. Darel Hong.     COVID-19  Prolonged discussion today. She has received one vaccine. She feels that since she works at home and is careful about who she is around, wears a mask and gloves her risk is lower. While this may be true we discussed that not all exposures are controllable and that the vaccine reduces risk of hospitalization, serious illness, late complications.  I reiterated my strong recommendation that she access this.     Mental Health  Provided support, in therapy locally. Symptoms are stable, exacerbated by stress.  Overall doing well and feels safe.     Sexual health & secondary prevention  Sex with men. Monogamous with single partner. She does disclose status. Never uses condoms, we have discussed U=U. Has BTL.    Lab Results   Component Value Date    RPR Non Reactive 12/14/2021    LABRPR Nonreactive 06/12/2020    CTNAA Negative 12/14/2021    CTNAA Negative 05/06/2021    CTNAA Negative 05/06/2021    GCNAA Negative 12/14/2021    GCNAA Negative 05/06/2021    GCNAA Negative 05/06/2021    SPECTYPE Urine 10/28/2020    SPECTYPE Swab 10/28/2020    SPECTYPE Swab 10/28/2020    SPECSOURCE Urine 10/28/2020    SPECSOURCE Throat 10/28/2020    SPECSOURCE Rectum 10/28/2020     GC/CT NAATs --  Negative 12/2021    RPR -- NR 12/2021 - repeat 1Y      Health maintenance  Lab Results   Component Value Date    CREATININE 0.90 12/14/2021    QFTTBGOLD NEGATIVE 02/13/2014    HEPCAB Nonreactive 10/28/2020    CHOL 164 12/14/2021    HDL 39 (L) 12/14/2021    LDL 96 12/14/2021    NONHDL 125 12/14/2021    TRIG 166 (H) 12/14/2021    A1C 5.5 12/14/2021    PAP Negative for intraephithelial lesion or malignancy 03/29/2017    FINALDX  03/29/2019     A: Placenta, delivery    - Preterm (36 weeks 1 days EGA) singleton third trimester placenta, weight 320 g (  weight 10th percentile for EGA)   - No acute chorioamnionitis identified   - Three vessel umbilical cord with narrow diameter (0.9 cm) and a furcate insertion  - Placental plate: accelerated maturation of chorionic villi,  increased intervillous and subchorionic fibrin, focal fetal thrombotic vasculopathy, small placental infarction (1.2 cm)          This electronic signature is attestation that the pathologist personally reviewed the submitted material(s) and the final diagnosis reflects that evaluation.         Health maintenance  - chronic, stable    Oral health  She does  have a dentist. Last dental exam within past year.    Eye health  She does  use corrective lenses. Last eye exam within past year.    Metabolic conditions  Wt Readings from Last 5 Encounters:   03/09/22 76.2 kg (168 lb)   08/18/21 72.7 kg (160 lb 4.4 oz)   08/18/21 72.7 kg (160 lb 3.2 oz)   08/18/21 72.6 kg (160 lb)   10/28/20 76.7 kg (169 lb)     Lab Results   Component Value Date CREATININE 0.90 12/14/2021    PROTEINUA Negative 02/19/2020    PROTEINUR <4.0 03/29/2019    GLUCOSEU Negative 02/19/2020    PCRATIOUR  03/29/2019      Comment:      Unable to calculate due to value below lower limit of assay linearity.    GLU 79 08/18/2021    A1C 5.5 12/14/2021    ALT 19 12/14/2021    ALT <7 (L) 08/18/2021    ALT 13 12/04/2020     # Kidney health - urine studies needed but deferred to future visit  # Bone health - assessment not yet needed (under age 63)  # Diabetes assessment -  Recent A1c WNL (12/2021)  # NAFLD assessment - monitor over time    Communicable diseases  Lab Results   Component Value Date    QFTTBGOLD NEGATIVE 02/13/2014    HEPAIGG Nonreactive 02/13/2014    HEPBSAB Reactive 02/13/2014    HEPCAB Nonreactive 10/28/2020    RUBIG Negative 05/30/2018    VZVIGG Positive 05/30/2018     # TB screening - no longer needed; negative IGRA, low risk  # Hepatitis screening - repeat HCV screen periodically  # MMR screening - assessment(s) needed but deferred to future visit    Cancer screening  Lab Results   Component Value Date    PAP Negative for intraephithelial lesion or malignancy 03/29/2017    FINALDX  03/29/2019     A: Placenta, delivery    - Preterm (36 weeks 1 days EGA) singleton third trimester placenta, weight 320 g (weight 10th percentile for EGA)   - No acute chorioamnionitis identified   - Three vessel umbilical cord with narrow diameter (0.9 cm) and a furcate insertion  - Placental plate: accelerated maturation of chorionic villi,  increased intervillous and subchorionic fibrin, focal fetal thrombotic vasculopathy, small placental infarction (1.2 cm)          This electronic signature is attestation that the pathologist personally reviewed the submitted material(s) and the final diagnosis reflects that evaluation.         # Cervical - repeat Pap with HPV testing 3 years from prior  # Breast - no indication for screening at present    # Anorectal - not yet done  # Colorectal - screening not indicated  # Liver - no screening indicated  # Lung - screening not indicated  Cardiovascular disease  Lab Results   Component Value Date    CHOL 164 12/14/2021    HDL 39 (L) 12/14/2021    LDL 96 12/14/2021    NONHDL 125 12/14/2021    TRIG 166 (H) 12/14/2021     # The ASCVD Risk score (Arnett DK, et al., 2019) failed to calculate.  - is not taking aspirin   - is not taking statin  - BP control excellent  - former smoker  # AAA screening - no indication for screening    Immunization History   Administered Date(s) Administered    COVID-19 VACC,MRNA,(PFIZER)(PF) 10/28/2020    Influenza Vaccine Quad (IIV4 PF) 79mo+ injectable 06/30/2015, 06/15/2016, 07/04/2018, 10/28/2020, 08/18/2021    MMR 03/31/2019    PNEUMOCOCCAL POLYSACCHARIDE 23 03/31/2019    TdaP 02/05/2019     Immunizations today -  Discussed need for bivalent COVID booster and patient declines recommended immunization(s). Needs MMR at future visit.    I personally spent 45 minutes face-to-face and non-face-to-face in the care of this patient, which includes all pre, intra, and post visit time on the date of service.  All documented time was specific to the E/M visit and does not include any procedures that may have been performed.  Counseled as documented above regarding mental health, COVID-19 vaccine, nausea management, medication adherence, weight management and healthy lifestyle choices and need for recommended screening tests.    Disposition  Return to clinic 5-6 months or sooner if needed.    [name], MD, MPH   [facility] at [facility]  [address]   [place], [place] [address]  Phone: [phone]   Fax: [phone]     Subjective:      Chief Complaint   HIV followup    HPI  Return patient visit for Morgan Hebert, a 36 y.o. woman with well-controlled HIV, pseudotumor cerebri with associated headaches.  Working a lot, had to travel to [place] last week and missed her appointment with Dr Brooke Dare.  Back together with her husband Loraine Leriche - things are going well. Toddler [name]) is doing great. Older daughter Gordy Savers) is as well, has custody of niece Lucita Lora) which has been rocky (older sister of niece who died, mom just released from jail) but this is going much more smoothly. Older girls are in 9th and 10th grades.  No more cigarettes, only marijuana  Only one COVID vaccine so far.     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; reduced carrier risk [x]  Hemoglobinopathy screening - normal adult hemoglobin  WGS study eligible: no  GENETIC COUNSELING PREVISIT SUMMARY Referring provider:  MFM  Indication: Aneuploidy screening Oth    Environmental allergies     HIV (human immunodeficiency virus infection) (CMS-HCC)     HIV (human immunodeficiency virus infection) (CMS-HCC)     IIH (idiopathic intracranial hypertension)     IUD (intrauterine device) in place     Lymphadenopathy 02/11/2014    Migraine     daily; uses marijuana and tylenol for management; pseudotumor cerebri    Obesity     Pseudotumor cerebri  Rubella non-immune status, antepartum 01/14/2019    [ ]  PP MMR    STD (sexually transmitted disease)     Supervision of high risk pregnancy, unspecified, unspecified trimester 05/30/2018    Dating: 9/21 (Unsure of LMP as had a D&C for a missed AB in 06/2018 and discovered she was pregnant in 07/2018)                                    Prenatal Screening:  [x]  Prenatal labs reviewed [x]  SMA 2 copies/Hgb electropheresis wnl/CF [ ]   [x]  1 hr GTT= 250 but done 3 days after BMZ (4/22 and 4/23) --> Repeat 1 hr GTT  5-18= 229  [ ]  28 wk CBC/RPR [ ]  GBS   Fetal Screening:  [x]  aneuploidy scre    Threatened premature labor in third trimester 01/31/2019    Received BMZ 4/22 (Nathalie)-4/23 [facility]) for concern for PTL. SVE cl/th/hi.    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 See bottom of this visit's encounter summary for details.  Current Outpatient Medications on File Prior to Visit   Medication Sig    acetaminophen (TYLENOL) 325 MG tablet Take 2 tablets (650 mg total) by mouth Every six (6) hours.    acetaZOLAMIDE (DIAMOX) 500 mg ER 12 hr capsule Take 2 capsules (1,000 mg total) by mouth Two (2) times a day.    bictegrav-emtricit-tenofov ala (BIKTARVY) 50-200-25 mg tablet Take 1 tablet by mouth daily.    BINAXNOW COVID-19 AG SELF TEST Kit TEST AS DIRECTED TODAY    ibuprofen (MOTRIN) 600 MG tablet Take 1 tablet (600 mg total) by mouth Every six (6) hours.    prochlorperazine (COMPAZINE) 10 MG tablet Take 1 tablet (10 mg total) by mouth two (2) times a day as needed for nausea.     No current facility-administered medications on file prior to visit.     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     Social History  General - lives in [place] with her husband and her 2 daughters (born 2008, 2020). Also has custody of niece (2007).   Doreene Adas and his girlfriend are there sometimes as well.   2021 - Family trauma, her 12yo niece committed suicide and her aunt passed away from cancer.   Mom and sister are both positive (sister is perinatally infected, patient with presumed sexual transmission from female partner).  Works at home for [employer], dreams of becoming a Clinical research associate or a Therapist, music. Loves vampire shows like True Blood.   Sexual History - sex with men (husband only)  Substance Use - marijuana (smokes nightly), smoked maybe 3 tobacco cigarettes a day but quit in 2022.  Social History Tobacco Use    Smoking status: Every Day     Packs/day: 0.50     Years: 5.00     Pack years: 2.50     Types: Cigarettes    Smokeless tobacco: Never   Substance Use Topics    Alcohol use: Yes     Alcohol/week: 2.0 standard drinks     Types: 1 Shots of liquor, 1 Glasses of wine per week  Comment: rare     Review of Systems  As per HPI. Remainder of 10 systems reviewed, negative.      Objective:      BP 121/85 (BP Site: L Arm, BP Position: Sitting, BP Cuff Size: Medium)  - Pulse 79  - Temp 36.8 ??C (98.2 ??F) (Oral)  - Ht 151 cm (4' 11.45)  - Wt 76.2 kg (168 lb)  - BMI 33.42 kg/m??   Wt Readings from Last 3 Encounters:   08/18/21 72.7 kg (160 lb 4.4 oz)   08/18/21 72.7 kg (160 lb 3.2 oz)   08/18/21 72.6 kg (160 lb)     Const looks well and attentive, alert, appropriate   Eyes sclerae anicteric, noninjected OU   ENT Well-healed molar extraction site (top left), no lesions   Lymph no cervical or supraclavicular LAD   CV RRR. No murmurs. No rub or gallop. S1/S2.   Lungs CTAB ant/post, normal work of breathing   GI Soft, no organomegaly. NTND. NABS.   GU deferred   Rectal deferred   Skin no petechiae, ecchymoses or rashes on full inspection. L buttock w large pedunculated skin tag.    MSK no joint tenderness and normal ROM throughout. No LE edema, feet warm with brisk DP/PT pulses.   Neuro CN II-XII grossly intact, MAEE, non focal   Psych Appropriate affect. Eye contact good. Linear thoughts. Fluent speech.     Laboratory Data  Reviewed in Epic today, using Synopsis and Chart Review filters.  05/06/21 Lab [name] - Study of Treatment and Reproductive Health Outcomes (STAR)   STI screen negative (CT, GC from 3 sites, cervical trich)  RPR non reactive  HIV RNA <20  CD4 1496 (52%)    Lab Results   Component Value Date    CREATININE 0.90 12/14/2021    QFTTBGOLD NEGATIVE 02/13/2014    HEPCAB Nonreactive 10/28/2020    CHOL 164 12/14/2021    HDL 39 (L) 12/14/2021    LDL 96 12/14/2021    NONHDL 125 12/14/2021    TRIG 166 (H) 12/14/2021    A1C 5.5 12/14/2021    PAP Negative for intraephithelial lesion or malignancy 03/29/2017    FINALDX  03/29/2019     A: Placenta, delivery    - Preterm (36 weeks 1 days EGA) singleton third trimester placenta, weight 320 g (weight 10th percentile for EGA)   - No acute chorioamnionitis identified   - Three vessel umbilical cord with narrow diameter (0.9 cm) and a furcate insertion  - Placental plate: accelerated maturation of chorionic villi,  increased intervillous and subchorionic fibrin, focal fetal thrombotic vasculopathy, small placental infarction (1.2 cm)          This electronic signature is attestation that the pathologist personally reviewed the submitted material(s) and the final diagnosis reflects that evaluation.                _____________________________________________________________________

## 2022-03-09 NOTE — Unmapped (Addendum)
It was great to see you today. We discussed:    I encourage you to get a COVID shot - there is only one type right now but two manufacturers Water quality scientist) - they are equally effective and pretty similar in terms of side effects.  We discussed weight gain, which is a difficult thing to pinpoint the cause of. We discussed that some people are more susceptible to weight gain from certain classes of HIV meds, like Biktarvy. Options would include switching to a different medicine.   We have a clinic nutritionist - we discussed this as an option if you would like to meet with her.    The ID clinic phone number is [phone].  The ID clinic fax number is [phone].    Please note that your laboratory and other results may be visible to you in real time, possibly before they reach your provider. Please allow 48 hours for clinical interpretation of these results. Importantly, even if a result is flagged as abnormal, it may not be one that impacts your health.    For urgent issues on nights and weekends you may reach the ID Physician on call through the [facility] Operator at [phone].     URGENT CARE  Please call ahead to speak with the nursing staff if you are in need of an urgent appointment.       MEDICATIONS  For refills please contact your pharmacy and ask them to electronically send or fax the request to the clinic.   Please bring all medications in original bottles to every appointment.    HMAP (formerly ADAP) or [employer] Eligibility (required even if you do not receive medication through [facility])  Please remember to renew your Juanell Fairly eligibility during renewal periods which occur twice a year: January-March and July-September.     The following are needed for each renewal:   - [facility] Identification (if you don't have one, then a bill with your name and address in [place])   - proof of income (award letter, W-2, or last three check stubs)   If you are unable to come in for renewal, let us know if we can mail, fax or e-mail paperwork to you.   HMAP Contact: [phone].     Neldon Mc, MD  [facility] Infectious Diseases Clinic at [facility]  [address]   [place], [place] [address]  Phone: [phone]   Fax: [phone]     Lab info:  Your most recent CD4 T-cell counts and viral loads are below. Here are a few things to keep in mind when looking at your numbers:  Our goal is to get your virus to be undetectable and keep it undetectable. If the virus is undetectable you are much more likely to stay healthy.  We consider your viral load to be undetectable if it says <40 or if it says Not detected.  For most people, we're checking CD4 counts every other visit (once or twice a year, or sometimes even less).  It's normal for your CD4 count to be different from visit to visit.   You can help by taking your medications at about the same time, every single day. If you're having trouble with taking your medications, it's important to let us know.    Lab Results   Component Value Date    ACD4 1,118 10/22/2018    CD4 54 10/22/2018    HIVCP <20 06/24/2020    HIVRS Not Detected 08/18/2021

## 2022-03-11 NOTE — Unmapped (Signed)
Name: Morgan Hebert  Date: 03/11/2022  Address: [address] [place] [address]   [place] of Residence:  [facility]  Phone: [phone]     Started assessment with patient options: in clinic     Is this the same address for mailing? Yes  If No, Mailing Address is:     Housing Scientist, water quality  Medicaid    Tax Filing Status  Head of Household    Employment Status  Employed Full Time    Income  Salary/Wages    If no or low income, how are you meeting your basic needs?  Food Stamps/EBT    List Tax Household Members including relationship to you:   Alphonsa Gin- child  Leighton Ruff- child  Kerby Less- child    Someone in my household receives: No Household Income/Deductions of any kind  Specify who: Byrd Terrero, [place], [place]    Do you have a current diagnosis for Hepatitis C?  Lab Results   Component Value Date    HEPCAB Nonreactive 10/28/2020       Have you used tobacco products four or more times per week in the last six months?  Yes    Teacher, adult education  Patient has affordable insurance through [employer], [place], and or Employment and is not eligible.    Patient given ACA education if they qualified based on answers to questions above.     MyChart  Do you have an active MyChart account? Yes     If MyChart is not set up, informed patient on how to set up MyChart Yes    Patient was informed of the following programs;   N/A    The following applications/handouts were given to patient:   N/A    The following forms were also started with the patient:   N/A    Juanell Fairly application status: Complete    Patient is applying for [employer] on Charges Only     Additional Comments: RW/Caps on Charges eligible. IPL= 126%; FPL= 61%. Expires: 03/09/2023        [facility]  Benefits Counselor  Time of Intervention: 15 mins

## 2022-03-31 NOTE — Unmapped (Signed)
[facility] Refill Coordination Note    Specialty Medication(s) to be Shipped:   Infectious Disease: Biktarvy    Other medication(s) to be shipped: acetazolamide  Prochlorperazine       Morgan Hebert, DOB: [birth year]  Phone: [phone] (home) [phone] (work)      All above HIPAA information was verified with patient.     Was a Nurse, learning disability used for this call? No    Completed refill call assessment today to schedule patient's medication shipment from the [facility] [phone]).  All relevant notes have been reviewed.     Specialty medication(s) and dose(s) confirmed: Regimen is correct and unchanged.   Changes to medications: Rifky reports no changes at this time.  Changes to insurance: No  New side effects reported not previously addressed with a pharmacist or physician: None reported  Questions for the pharmacist: No    Confirmed patient received a Conservation officer, historic buildings and a Surveyor, mining with first shipment. The patient will receive a drug information handout for each medication shipped and additional FDA Medication Guides as required.       DISEASE/MEDICATION-SPECIFIC INFORMATION        N/A    SPECIALTY MEDICATION ADHERENCE     Medication Adherence    Patient reported X missed doses in the last month: 0  Specialty Medication: biktarvy              Were doses missed due to medication being on hold? No    Unable to confirm quantity on hand    REFERRAL TO PHARMACIST     Referral to the pharmacist: Not needed      [facility]     Shipping address confirmed in Epic.     Delivery Scheduled: Yes, Expected medication delivery date: 6/28.     Medication will be delivered via Next Day Courier to the prescription address in Epic WAM.    Morgan Hebert   [facility] Specialty Technician

## 2022-04-05 MED FILL — ACETAZOLAMIDE ER 500 MG CAPSULE,EXTENDED RELEASE: ORAL | 30 days supply | Qty: 120 | Fill #6

## 2022-04-05 MED FILL — PROCHLORPERAZINE MALEATE 10 MG TABLET: ORAL | 15 days supply | Qty: 30 | Fill #1

## 2022-04-28 ENCOUNTER — Ambulatory Visit: Admit: 2022-04-28 | Discharge: 2022-04-29 | Payer: BLUE CROSS/BLUE SHIELD

## 2022-04-28 DIAGNOSIS — G932 Benign intracranial hypertension: Principal | ICD-10-CM

## 2022-04-28 MED ORDER — TOPIRAMATE 50 MG TABLET
ORAL_TABLET | ORAL | 2 refills | 23 days | Status: CP
Start: 2022-04-28 — End: 2022-11-08
  Filled 2022-05-02: qty 30, 23d supply, fill #0

## 2022-04-28 MED ORDER — SUMATRIPTAN 50 MG TABLET
ORAL_TABLET | Freq: Once | ORAL | 2 refills | 0 days | Status: CP | PRN
Start: 2022-04-28 — End: 2022-10-25
  Filled 2022-05-02: qty 8, 30d supply, fill #0

## 2022-04-28 NOTE — Unmapped (Signed)
Neurology Follow-up Visit Note     [facility]  [address]  [address]  [place] [address]     Date: 04/28/2022   Patient Name: Morgan Hebert   MRN: [id number]   PCP: Morgan Hebert  Referring Provider: Artelia Laroche, MD       Assessment and Plan          Morgan Hebert is a 36 y.o. female presenting for follow-up headache and history of IIH.    Pseudotumor Cerebri I Migraine HA  IIH was diagnosed in 2019 after 1 year of HA that progressed to include visual changes. Opening pressures on LP have been as high as 40. She has not required LP since 2020 and was maintained on Diamox only however she continued to have some headaches despite Diamox dosing as high as 1500 mg BID (this dose was increased from 1000 BID in the setting of pregnancy in 2020). She presented to neurology in November 2022 with worsened HA and 1 month of persistent AM N/V in the setting of being off Diamox therapy for 2 months. Diamox was suggested to be restarted at that time at 1g BID however patient has been taking 500mg  BID. She saw neuro-ophthalmology shortly after neurology in 08/2021, who noted a stable eye exam. As of 04/28/22, patient reports no reduction in HA since restarting her Diamox. She continues to have daily headaches and morning nausea. Given headaches did not respond at all to restarting Diamox, we considered other headache types including migraine (photophobia is also sometimes present without vision blurring) and will trial Topamax, as it may continue to help with headaches related to increased intracranial pressure as well as contribute to migraine prophylaxis. Will also trial Imitrex, and emphasized importance of using only 2 times a week, or less. She has BTL for birth control. She also continues to take Tylenol and Ibuprofen daily, for HA and abdominal cramping. We have discussed medication overuse HA and the importance of minimizing OTC HA medications (3x/week, at most). Advised patient that she should not be taking Compazine daily and that if nausea persists further (particularly despite anticipated HA control) she will need to discuss persistent nausea with PCP and/or GI specialist. Will also repeat MRI brain and get MRV head to evaluate for possible vascular contributions to persistent HA.    - MRI brain w/ w/out  - MRV head  - Stop Diamox  - Titrate Topamax to 100 mg nightly   - Imitrex 50 mg PRN at onset of migraine; no more than 2x/week  - Will need an EKG prior to any further refills of Compazine (although ideally she will not continue on Compazine)      Return Visit in: 3 months    I personally spent 60 minutes face-to-face and non-face-to-face in the care of this patient, which includes all pre, intra, and post visit time on the date of service.          Patient was discussed with Morgan Hebert who agrees with assessment and plan.    Morgan Pea, MD   Neurology, PGY-3           HPI         Morgan Hebert is a 36 y.o. female who presents to the [place] [facility] for follow-up of headache and history of IIH. Patient was last seen by [facility] on 08/18/21.      To  review their relevant history:    Diagnosed with migraines prior to 2018. Developed vision changes in 2019 which prompted evaluation for possible IIH and diagnosis of pseudotumor cerebri. She estimates she has had a total of about 10 LPs with the last being in 2020 at the time of delivery of her last child. Diamox was increased during her pregnancy due to weight gain and worsening of symptoms. She did well with Diamox for several years but discontinued it around September 2022. In November 2022, she presented with daily nausea and vomiting and daily headaches in the setting of being off Diamox. She had also previously taken Zofran PRN for headache-associated nausea but this stopped working and she tried a friend's Compazine, which helped. She has been taking Tylenol and Ibuprofen daily for headache and abdominal cramping. Diamox was restarted in November 2022 and patient followed up with neuro-ophthalmology.        Interval History as of 04/28/22:  - Remains nauseated every morning - taking compazine once in the morning which is effective for the remainder of the day  - HA is sometimes waking her up out of sleep (multiple times a week) otherwise she claims HA is unchanged throughout the day and evening whether she's sitting, standing, or lying  - Experiencing pulsatile ringing in ears about twice a week  - Experiencing photophobia with most headaches  - HA is worse around the time of her period  - Smoking tends to help with headaches and nausea  - No vision changes, balance difficulty, weakness, or sensory deficits       Allergies   Allergen Reactions   ??? 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        Current Outpatient Medications   Medication Sig Dispense Refill   ??? acetaminophen (TYLENOL) 325 MG tablet Take 2 tablets (650 mg total) by mouth Every six (6) hours. 120 tablet 0   ??? bictegrav-emtricit-tenofov ala (BIKTARVY) 50-200-25 mg tablet Take 1 tablet by mouth daily. 30 tablet 11   ??? ibuprofen (MOTRIN) 600 MG tablet Take 1 tablet (600 mg total) by mouth Every six (6) hours. 60 tablet 0   ??? SUMAtriptan (IMITREX) 50 MG tablet Take 1 tablet (50 mg total) by mouth once as needed for migraine. Do not take more than twice a week 8 tablet 2   ??? topiramate (TOPAMAX) 50 MG tablet Take 0.5 tablets (25 mg total) by mouth nightly for 7 days, THEN 1 tablet (50 mg total) nightly for 7 days, THEN 2 tablets (100 mg total) nightly. 30 tablet 2     No current facility-administered medications for this visit.       Past Medical History:   Diagnosis Date   ??? Abnormal mammogram    ??? Constipation    ??? Dental abscess    ??? Dental caries    ??? Diarrhea    ??? 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 Environmental allergies    ??? HIV (human immunodeficiency virus infection) (CMS-HCC)    ??? HIV (human immunodeficiency virus infection) (CMS-HCC)    ???  IIH (idiopathic intracranial hypertension)    ??? IUD (intrauterine device) in place    ??? Lymphadenopathy 02/11/2014   ??? Migraine     daily; uses marijuana and tylenol for management; pseudotumor cerebri   ??? Obesity    ??? Pseudotumor cerebri    ??? Rubella non-immune status, antepartum 01/14/2019    [ ]  PP MMR   ??? STD (sexually transmitted disease)    ??? Supervision of high risk pregnancy, unspecified, unspecified trimester 05/30/2018    Dating: 9/21 (Unsure of LMP as had a D&C for a missed AB in 06/2018 and discovered she was pregnant in 07/2018)                                    Prenatal Screening:  [x]  Prenatal labs reviewed [x]  SMA 2 copies/Hgb electropheresis wnl/CF [ ]   [x]  1 hr GTT= 250 but done 3 days after BMZ (4/22 and 4/23) --> Repeat 1 hr GTT  5-18= 229  [ ]  28 wk CBC/RPR [ ]  GBS   Fetal Screening:  [x]  aneuploidy scre   ??? Threatened premature labor in third trimester 01/31/2019    Received BMZ 4/22 ([place])-4/23 [facility]) for concern for PTL. SVE cl/th/hi.   ??? 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, symptomatic sometimes   ??? Tooth sensitivity     ULQ to sugar       Past Surgical History:   Procedure Laterality Date   ??? DILATION AND CURETTAGE OF UTERUS     ??? LUMBAR PUNCTURE     ??? PR CESAREAN DELIVERY ONLY N/A 03/29/2019    Procedure: CESAREAN DELIVERY ONLY;  Surgeon: Asher Muir, MD;  Location: L&D C-SECTION OR SUITES [facility];  Service: Maternal-Fetal Medicine   ??? PR COLONOSCOPY W/BIOPSY SINGLE/MULTIPLE  07/03/2014    Procedure: COLONOSCOPY, FLEXIBLE, PROXIMAL TO SPLENIC FLEXURE; WITH BIOPSY, SINGLE OR MULTIPLE;  Surgeon: Billie Ruddy, MD;  Location: GI PROCEDURES [facility];  Service: Gastroenterology   ??? PR DILATION/CURETTAGE,DIAGNOSTIC N/A 06/14/2018    Procedure: DILATION AND CURETTAGE, DIAGNOSTIC AND/OR THERAPEUTIC (NON OBSTETRICAL);  Surgeon: Nelle Don, MD;  [facility]: [facility];  Service: [facility]necology   ??? PR LAP,RMV  ADNEXAL STRUCTURE Bilateral 06/04/2019    Procedure: R21  LAPAROSCOPY, SURGICAL; W/REMOVAL OF ADNEXAL STRUCTURES (PARTIAL OR TOTAL OOPHORECTOMY &/OR SALPINGECTOMY);  Surgeon: Joneen Caraway, MD;  Location: [facility];  Service: Family Planning   ??? PR SIGMOIDOSCOPY FLX DX W/COLLJ SPEC BR/WA IF PFRMD N/A 06/25/2020    Procedure: SIGMOIDOSCOPY, FLEXIBLE; DIAGNOSTIC, WITH OR WITHOUT COLLECTION OF SPECIMEN(S) BY BRUSHING OR WASHING;  Surgeon: Bronson Curb, MD;  Location: [facility] PROCEDURES [facility];  Service: Gastroenterology   ??? PR UPPER GI ENDOSCOPY,BIOPSY N/A 07/03/2014    Procedure: UGI ENDOSCOPY; WITH BIOPSY, SINGLE OR MULTIPLE;  Surgeon: Billie Ruddy, MD;  Location: [facility];  Service: Gastroenterology   ??? WISDOM TOOTH EXTRACTION         Social History     Socioeconomic History   ??? Marital status: Married     Spouse name: Loraine Leriche    ??? Number of children: 2   ??? Years of education: None   ??? Highest education level: None   Tobacco Use   ??? Smoking status: Former     Packs/day: 0.50     Years: 5.00     Pack years:  2.50     Types: Cigarettes   ??? Smokeless tobacco: Never   Vaping Use   ??? Vaping Use: Never used   Substance and Sexual Activity   ??? Alcohol use: Yes     Alcohol/week: 4.0 standard drinks     Types: 1 Glasses of wine, 1 Shots of liquor, 2 Standard drinks or equivalent per week     Comment: rare   ??? Drug use: Yes     Frequency: 35.0 times per week     Types: Marijuana     Comment: depends on the week for migraines   ??? Sexual activity: Yes     Partners: Male   Social History Narrative    Patient lives in [place], [place] with her Husband and two daughters..  She is not employed.       Family History   Problem Relation Age of Onset   ??? Cervical cancer Mother    ??? Diabetes Mother    ??? Asthma Mother    ??? Hypertension Mother    ??? Diabetes Maternal Aunt    ??? Hypertension Maternal Aunt    ??? Breast cancer Maternal Grandmother    ??? Cancer Maternal Grandmother    ??? No Known Problems Daughter    ??? Glaucoma Neg Hx             Objective        Vital signs: BP 109/72 (BP Site: L Arm, BP Position: Sitting, BP Cuff Size: Large)  - Pulse 100  - Ht 151 cm (4' 11.45)  - Wt 76.7 kg (169 lb)  - LMP 04/07/2022  - BMI 33.62 kg/m??        Physical Exam:  General Appearance: Well appearing. In no acute distress.  HEENT: Head is atraumatic and normocephalic. Sclera anicteric without injection. Oropharyngeal membranes are moist with no erythema or exudate.  Neck: Supple.  Lungs:  normal WOB on RA  Heart: Radial pulses 2+ bilaterally.  Abdomen: Nondistended.  Extremities: No clubbing, cyanosis, or edema.    Neurological Examination:     Mental Status: Alert, conversant, able to follow conversation and interview. Spontaneous speech was fluent without word finding pauses, dysarthria, or paraphasic errors. Comprehension was intact to simple and multi-step commands. Memory for recent and remote events was intact. Affect was full range and appropriate to context.    Cranial Nerves: Visual fields intact to direct confrontation. PERRL 4 mm. Pursuit eye movements were uninterrupted with full range and without more than end-gaze nystagmus. Facial sensation intact bilaterally to light touch on the forehead, cheek, and chin. Face symmetric at rest. Normal facial movement bilaterally, including forehead, eye closure and grimace/smile. Hearing intact to conversation. Shoulder shrug full strength bilaterally.    Motor Exam: Normal bulk.  Normal tone in the upper and lower extremities.  No tremors, myoclonus, or other adventitious movement.  Pronator drift is absent.  UE R/L: deltoid 5/5, biceps 5/5, triceps 5/5, wrist flexion 5/5, wrist extension 5/5, finger spread 5/5 and hand grip strong/strong.  LE R/L: hip flexion 5/5, hip extension 5/5, quadriceps 5/5, hamstrings 5/5, dorsiflexion 5/5 and plantar flexion 5/5.  Fine finger movements and foot taps had normal velocity and amplitude with no breakdown.      Reflexes:   R L   Biceps +2 +2   Brachioradialis +2 +2   Triceps +2 +2   Patella +2 +2   Achilles +2 +2       Sensory: Sensation normal to light touch and temperature  sensation to cold in both hands and both feet and to position sense and vibration distally in the fingers and toes. Romberg signnegative.    Cerebellar/Coordination/Gait: Rapid alternating movements are normal in bilateral upper extremities. Finger-to-nose is normal without ataxia or dysmetria bilaterally. Heel-to-shin is normal without ataxia or dysmetria bilaterally. Gait exam demonstrates normal posture, base, stride length, arm swing and turns.

## 2022-04-28 NOTE — Unmapped (Addendum)
-   stop Diamox  - start Topamax 25 mg nightly then increase to 50 mg nightly after 7 days then increase to 100 mg nightly after 7 more days. Please let me know if you feel you are not tolerating this medication or having side effects  - use Imitrex at the onset of a migraine (or your worst headache of the week). Do not use this more than twice a week  - try to limit use of Compazine (hopefully your nausea improves with migraine treatment)  - try to limit use of tylenol and ibuprofen to less than 3x per week, as this can contribute to medication overuse headache  - someone will call you to schedule your MRI/MRV  - we will see you back via video in 3 months to see how your headaches are doing

## 2022-05-02 MED FILL — BIKTARVY 50 MG-200 MG-25 MG TABLET: ORAL | 30 days supply | Qty: 30 | Fill #1

## 2022-05-02 NOTE — Unmapped (Signed)
[facility] Clinical Assessment & Refill Coordination Note    Morgan Hebert, DOB: Oct 02, 1986  Phone: [phone] (home) [phone] (work)    All above HIPAA information was verified with patient.     Was a Nurse, learning disability used for this call? No    Specialty Medication(s):   Infectious Disease: Biktarvy     Current Outpatient Medications   Medication Sig Dispense Refill    acetaminophen (TYLENOL) 325 MG tablet Take 2 tablets (650 mg total) by mouth Every six (6) hours. 120 tablet 0    bictegrav-emtricit-tenofov ala (BIKTARVY) 50-200-25 mg tablet Take 1 tablet by mouth daily. 30 tablet 11    ibuprofen (MOTRIN) 600 MG tablet Take 1 tablet (600 mg total) by mouth Every six (6) hours. 60 tablet 0    SUMAtriptan (IMITREX) 50 MG tablet Take 1 tablet (50 mg total) by mouth once as needed for migraine. Do not take more than twice a week 8 tablet 2    topiramate (TOPAMAX) 50 MG tablet Take 0.5 tablets (25 mg total) by mouth nightly for 7 days, THEN 1 tablet (50 mg total) nightly for 7 days, THEN 2 tablets (100 mg total) nightly. 30 tablet 2     No current facility-administered medications for this visit.        Changes to medications:  starting topirimate and stopped acetazolamide    Allergies   Allergen Reactions    Peanut Other (See Comments)     Patient allergic to walnuts, cashews, pistachios and peanuts in excess.       Changes to allergies: No    SPECIALTY MEDICATION ADHERENCE     Biktarvy 50-200-5 mg: 8 to 9 days of medicine on hand       Medication Adherence    Patient reported X missed doses in the last month: 0  Specialty Medication: Biktarvy 50-200-25mg   Patient is on additional specialty medications: No  Any gaps in refill history greater than 2 weeks in the last 3 months: no  Demonstrates understanding of importance of adherence: yes  Informant: patient  Provider-estimated medication adherence level: good  Patient is at risk for Non-Adherence: No          Specialty medication(s) dose(s) confirmed: Regimen is correct and unchanged.     Are there any concerns with adherence? No    Adherence counseling provided? Not needed    CLINICAL MANAGEMENT AND INTERVENTION      Clinical Benefit Assessment:    Do you feel the medicine is effective or helping your condition? Yes    HIV ASSOCIATED LABS:     Lab Results   Component Value Date/Time    HIVRS Not Detected 08/18/2021 05:05 PM    HIVRS Not Detected 10/28/2020 10:17 AM    HIVRS Detected (A) 02/25/2019 10:12 AM    HIVRS 42,400 10/23/2017 12:00 AM    HIVRS not detected 03/29/2017 12:00 AM    HIVRS detected 10/25/2016 12:00 AM    HIVCP <20 06/24/2020 08:23 AM    HIVCP <20 06/12/2020 11:43 AM    HIVCP <40 (H) 02/25/2019 10:12 AM    HIVCP <40 (H) 07/04/2018 02:06 PM    HIVCP detected 10/23/2017 12:00 AM    RCD4 54.4 05/01/2015 12:00 AM    RCD4 53.7 10/24/2014 12:00 AM    ACD4 1,118 10/22/2018 09:45 AM    ACD4 1,696 (A) 05/01/2015 12:00 AM    ACD4 967 10/24/2014 12:00 AM    ACD4 854 06/23/2014 12:26 PM  Clinical Benefit counseling provided? Labs from 08/18/21 show evidence of clinical benefit    Adverse Effects Assessment:    Are you experiencing any side effects? No    Are you experiencing difficulty administering your medicine? No    Quality of Life Assessment:      How many days over the past month did your HIV  keep you from your normal activities? For example, brushing your teeth or getting up in the morning. 0    Have you discussed this with your provider? Not needed    Acute Infection Status:    Acute infections noted within Epic:  No active infections  Patient reported infection: None    Therapy Appropriateness:    Is therapy appropriate and patient progressing towards therapeutic goals? Yes, therapy is appropriate and should be continued    DISEASE/MEDICATION-SPECIFIC INFORMATION      N/A    PATIENT SPECIFIC NEEDS     Does the patient have any physical, cognitive, or cultural barriers? No    Is the patient high risk? No    Does the patient require a Care Management Plan? No       SHIPPING     Specialty Medication(s) to be Shipped:   Infectious Disease: Biktarvy    Other medication(s) to be shipped:  topirimate and sumatriptan     Changes to insurance: No    Delivery Scheduled: Yes, Expected medication delivery date: 05/03/22.     Medication will be delivered via Next Day Courier to the confirmed prescription address in [facility].    The patient will receive a drug information handout for each medication shipped and additional FDA Medication Guides as required.  Verified that patient has previously received a Conservation officer, historic buildings and a Surveyor, mining.    The patient or caregiver noted above participated in the development of this care plan and knows that they can request review of or adjustments to the care plan at any time.      All of the patient's questions and concerns have been addressed.    Roderic Palau   [facility] Specialty Pharmacist

## 2022-06-07 ENCOUNTER — Ambulatory Visit: Admit: 2022-06-07 | Discharge: 2022-06-08 | Payer: BLUE CROSS/BLUE SHIELD

## 2022-06-07 DIAGNOSIS — B20 Human immunodeficiency virus [HIV] disease: Principal | ICD-10-CM

## 2022-06-07 DIAGNOSIS — F439 Reaction to severe stress, unspecified: Principal | ICD-10-CM

## 2022-06-07 DIAGNOSIS — G932 Benign intracranial hypertension: Principal | ICD-10-CM

## 2022-06-07 DIAGNOSIS — Z6833 Body mass index (BMI) 33.0-33.9, adult: Principal | ICD-10-CM

## 2022-06-07 DIAGNOSIS — E669 Obesity, unspecified: Principal | ICD-10-CM

## 2022-06-07 DIAGNOSIS — R110 Nausea: Principal | ICD-10-CM

## 2022-06-07 DIAGNOSIS — K219 Gastro-esophageal reflux disease without esophagitis: Principal | ICD-10-CM

## 2022-06-07 DIAGNOSIS — Z8632 Personal history of gestational diabetes: Principal | ICD-10-CM

## 2022-06-07 DIAGNOSIS — Z0000 Encounter for general adult medical examination without abnormal findings: Principal | ICD-10-CM

## 2022-06-07 MED ORDER — OMEPRAZOLE 20 MG CAPSULE,DELAYED RELEASE
ORAL_CAPSULE | Freq: Every day | ORAL | 3 refills | 90 days | Status: CP
Start: 2022-06-07 — End: 2023-06-07
  Filled 2022-06-08: qty 90, 90d supply, fill #0

## 2022-06-07 NOTE — Unmapped (Signed)
[facility] Refill Coordination Note    Specialty Medication(s) to be Shipped:   Infectious Disease: Biktarvy    Other medication(s) to be shipped: No additional medications requested for fill at this time     Morgan Hebert, DOB: Feb 25, 1986  Phone: [phone] (home) [phone] (work)      All above HIPAA information was verified with patient.     Was a Nurse, learning disability used for this call? No    Completed refill call assessment today to schedule patient's medication shipment from the [facility] [phone]).  All relevant notes have been reviewed.     Specialty medication(s) and dose(s) confirmed: Regimen is correct and unchanged.   Changes to medications: Morgan Hebert reports starting the following medications: Topamax  Changes to insurance: No  New side effects reported not previously addressed with a pharmacist or physician: None reported  Questions for the pharmacist: No    Confirmed patient received a Conservation officer, historic buildings and a Surveyor, mining with first shipment. The patient will receive a drug information handout for each medication shipped and additional FDA Medication Guides as required.       DISEASE/MEDICATION-SPECIFIC INFORMATION        N/A    SPECIALTY MEDICATION ADHERENCE     Medication Adherence    Patient reported X missed doses in the last month: 0  Specialty Medication: biktarvy  Patient is on additional specialty medications: No  Patient is on more than two specialty medications: No                                Were doses missed due to medication being on hold? No    Biktarvy 50-200-25 mg: 6 days of medicine on hand        REFERRAL TO PHARMACIST     Referral to the pharmacist: Not needed      [facility]     Shipping address confirmed in Epic.     Delivery Scheduled: Yes, Expected medication delivery date: 06/10/22.     Medication will be delivered via Next Day Courier to the prescription address in Epic WAM.    Morgan Hebert   [facility] Specialty Technician

## 2022-06-07 NOTE — Unmapped (Signed)
Internal Medicine Clinic Visit    Reason for visit: ***    A/P:    ***  Pseudotumor cerebri  Overview:  - Patient reports a long history of complex headaches; diagnosed with pseudotumor 2019-  Now with nausea.   08/18/2021  Seen by neurology, restarted on acetazolamide, given promethazine for nausea        History of diet controlled gestational diabetes mellitus (GDM)  Overview:  06/06/2022  Check A1C  Addendum: normal  Lab Results   Component Value Date    A1C 5.5 12/14/2021           HIV  Overview:  - Diagnosed in 02/2014 when patient presented to the ED with adenopathy. Initial CD4 644, HIV RNA 11,000  -Followed by Dr Rosemarie Beath.   Lives with husband who is HIV neg. He is aware of her status  On Biktarvy  Lab Results   Component Value Date    ACD4 1,118 10/22/2018    CD4 54 10/22/2018    HIVCP <20 06/24/2020    HIVRS Not Detected 08/18/2021               Health Maintenance:   Health Maintenance   Topic Date Due    Pneumococcal Vaccine 0-64 (2 - PCV) 03/30/2020    COVID-19 Vaccine (2 - Pfizer risk series) 11/18/2020    Influenza Vaccine (1) 06/10/2022    HPV Cotest with Pap Smear (21-65)  08/25/2026    Pap Smear with Cotest HPV (21-65)  08/27/2026    DTaP/Tdap/Td Vaccines (2 - Td or Tdap) 02/04/2029    Hepatitis C Screen  Completed         No follow-ups on file.  Next Visit:   ***  __________________________________________________________    HPI:  Pt has history of pseudotumor cerebri, headaches, and HIV.   Saw neurology in July   MRI brain w/ w/out  - MRV head  - Stop Diamox  - Titrate Topamax to 100 mg nightly   - Imitrex 50 mg PRN at onset of migraine; no more than 2x/week  - Will need an EKG prior to any further refills of Compazine (although ideally she will not continue on Compazine)    Lab Results   Component Value Date    ACD4 1,118 10/22/2018    CD4 54 10/22/2018    HIVCP <20 06/24/2020    HIVRS Not Detected 08/18/2021       __________________________________________________________    Problem List:  Patient Active Problem List   Diagnosis    Pseudotumor cerebri    HIV    History of diet controlled gestational diabetes mellitus (GDM)    Nausea       Medications:  Reviewed in EPIC    _________________________________________________________    Physical Exam:   Vital Signs:  There were no vitals filed for this visit.  Wt Readings from Last 3 Encounters:   04/28/22 76.7 kg (169 lb)   03/09/22 76.2 kg (168 lb)   08/18/21 72.7 kg (160 lb 4.4 oz)     Gen: Well appearing, NAD  CV: RRR, no murmurs  Pulm: CTA bilaterally, no crackles or wheezes  Abd: Soft, NTND, normal BS. No HSM.  Ext: No edema  MSK:***  Psych:***  ***  Records review***  Last   Lab Results   Component Value Date    CREATININE 0.90 12/14/2021    CHOL 164 12/14/2021    HDL 39 (L) 12/14/2021    LDL 96 12/14/2021    NONHDL 125  12/14/2021    TRIG 166 (H) 12/14/2021    A1C 5.5 12/14/2021    PAP Negative for intraephithelial lesion or malignancy 03/29/2017        The ASCVD Risk score (Arnett DK, et al., 2019) failed to calculate.   Medication adherence and barriers to the treatment plan have been addressed. Opportunities to optimize healthy behaviors have been discussed. Patient / caregiver voiced understanding.    {TIP - HCC- RAFF Pilot- Clinical Documentation Specialist Recommendations-  No specialty comments available.   This text will self delete upon signing note:[id number]}     I personally spent *** minutes face-to-face and non-face-to-face in the care of this patient, which includes all pre, intra, and post visit time on the date of service. __________________________________________________________    Problem List:  Patient Active Problem List   Diagnosis    Pseudotumor cerebri    HIV    History of diet controlled gestational diabetes mellitus (GDM)    Nausea    Class 1 obesity without serious comorbidity with body mass index (BMI) of 33.0 to 33.9 in adult    Gastroesophageal reflux disease    Adult general medical examination    Stress at home       Medications:  Reviewed in EPIC    _________________________________________________________    Physical Exam:   Vital Signs:  Vitals:    06/07/22 0750   BP: 116/86   BP Site: L Arm   BP Position: Sitting   Pulse: 89   Temp: 37.1 ??C (98.7 ??F)   TempSrc: Oral   SpO2: 99%   Weight: 77.5 kg (170 lb 12.8 oz)     Wt Readings from Last 3 Encounters:   06/07/22 77.5 kg (170 lb 12.8 oz)   04/28/22 76.7 kg (169 lb)   03/09/22 76.2 kg (168 lb)     Gen: Well appearing, NAD  CV: RRR, no murmurs  Pulm: CTA bilaterally, no crackles or wheezes  Abd: Soft, NTND, normal BS. No HSM.  Ext: No edema    Records review  Last   Lab Results   Component Value Date    CREATININE 0.90 12/14/2021    CHOL 164 12/14/2021    HDL 39 (L) 12/14/2021    LDL 96 12/14/2021    NONHDL 125 12/14/2021    TRIG 166 (H) 12/14/2021    A1C 5.5 12/14/2021    PAP Negative for intraephithelial lesion or malignancy 03/29/2017        The ASCVD Risk score (Arnett DK, et al., 2019) failed to calculate.   Medication adherence and barriers to the treatment plan have been addressed. Opportunities to optimize healthy behaviors have been discussed. Patient / caregiver voiced understanding.         I personally spent 35 minutes face-to-face and non-face-to-face in the care of this patient, which includes all pre, intra, and post visit time on the date of service.

## 2022-06-07 NOTE — Unmapped (Signed)
 Internal Medicine at [facility]     Type of visit:  face to face    Reason for visit: Follow up    Questions / Concerns that need to be addressed: none    General Consent to Treat (GCT) for non-epic video visits only: Verbal consent      PTHomeBP     Screening BP-    121//87  92    Omron BPs (complete if screening BP has a systolic  > 129 or diastolic > 79)  BP#1  114/86  90  BP#2   115/87  89  BP#3   120/86  87    Average BP   116/86  89  (please note this as a comment in vitals)     Allergies reviewed: Yes    Medication reviewed: Yes  Pended refills? No    HCDM reviewed and updated in Epic:    We are working to make sure all of our patients??? wishes are updated in Epic and part of that is documenting a Environmental health practitioner for each patient  A Health Care Decision Maker is someone you choose who can make health care decisions for you if you are not able - who would you most want to do this for you????  is already up to date.    HCDM (patient stated preference): Laverle Patter - Spouse - [phone]    HCDM (patient stated preference): Delsy, Mccoury - Sister - [phone]        COVID-19 Vaccine Summary  Which COVID-19 Vaccine was administered  Pfizer  Type:  Dates Given:             Immunization History   Administered Date(s) Administered   ??? COVID-19 VACC,MRNA,(PFIZER)(PF) 10/28/2020   ??? Influenza Vaccine Quad (IIV4 PF) 68mo+ injectable 06/30/2015, 06/15/2016, 07/04/2018, 10/28/2020, 08/18/2021   ??? MMR 03/31/2019   ??? PNEUMOCOCCAL POLYSACCHARIDE 23 03/31/2019   ??? TdaP 02/05/2019       __________________________________________________________________________________________    SCREENINGS COMPLETED IN FLOWSHEETS    HARK Screening       AUDIT       PHQ2       PHQ9          P4 Suicidality Screener                GAD7       COPD Assessment       Falls Risk

## 2022-06-07 NOTE — Unmapped (Signed)
It was good to see you.   [employer] has an Marine scientist, in which you can put in your location, insurance, and other preferences (type of counseling, gender, etc) and they will show you the available providers in your area.

## 2022-06-08 MED FILL — BIKTARVY 50 MG-200 MG-25 MG TABLET: ORAL | 30 days supply | Qty: 30 | Fill #2

## 2022-06-08 MED FILL — TOPIRAMATE 50 MG TABLET: ORAL | 15 days supply | Qty: 30 | Fill #1

## 2022-06-16 NOTE — Unmapped (Signed)
Morgan Hebert is a 36 y.o. female here for a SHORT MWCC[facility] Study Visit (IRB [phone]) on 06/16/2022. HIV positive participant. Denies complaints today.     Negative COVID-19 Screen: Wellness screening and temperature check conducted at clinic door. Participant reported no COVID-19 symptoms, no contact to a known or suspected COVID-19 case in the last 14 days, and temperature was within normal limits. Participant was cleared to enter the facility.     Study visit included: Interview   Labs Collected: blood specimens

## 2022-06-16 NOTE — Unmapped (Signed)
Pulmonary function testing was performed as of the MACS/WIHS Combined Cohort Study (21-0857), with results attached in media tab. Clinical read will be provided below by study pulmonologist.

## 2022-07-01 NOTE — Unmapped (Signed)
[name]    Specialty Medication(s) to be Shipped:   Infectious Disease: Biktarvy    Other medication(s) to be shipped:  Topiramate 50mg ,Sumatriptan      Morgan Hebert, DOB: October 17, 1985  Phone: [phone] (home) [phone] (work)      All above HIPAA information was verified with patient.     Was a Nurse, learning disability used for this call? No    Completed refill call assessment today to schedule patient's medication shipment from the [facility] [phone]).  All relevant notes have been reviewed.     Specialty medication(s) and dose(s) confirmed: Regimen is correct and unchanged.   Changes to medications: Icyss reports no changes at this time.  Changes to insurance: No  New side effects reported not previously addressed with a pharmacist or physician: None reported  Questions for the pharmacist: No    Confirmed patient received a Conservation officer, historic buildings and a Surveyor, mining with first shipment. The patient will receive a drug information handout for each medication shipped and additional FDA Medication Guides as required.       DISEASE/MEDICATION-SPECIFIC INFORMATION        N/A    SPECIALTY MEDICATION ADHERENCE     Medication Adherence    Patient reported X missed doses in the last month: 0  Specialty Medication: biktarvy 50-200-25mg   Patient is on additional specialty medications: No  Patient is on more than two specialty medications: No  Any gaps in refill history greater than 2 weeks in the last 3 months: no  Demonstrates understanding of importance of adherence: yes  Informant: patient  Reliability of informant: reliable  Provider-estimated medication adherence level: good  Patient is at risk for Non-Adherence: No  Reasons for non-adherence: no problems identified                  Confirmed plan for next specialty medication refill: delivery by pharmacy  Refills needed for supportive medications: not needed          Refill Coordination    Has the Patients' Contact Information Changed: No  Is the Shipping Address Different: No         Were doses missed due to medication being on hold? No    Biktarvy  50-200-25 mg: 8 days of medicine on hand       REFERRAL TO PHARMACIST     Referral to the pharmacist: Not needed      [facility]     Shipping address confirmed in Epic.     Delivery Scheduled: Yes, Expected medication delivery date: 09/27.     Medication will be delivered via Next Day Courier to the prescription address in Epic WAM.    Morgan Hebert   [facility] Specialty Technician

## 2022-07-05 ENCOUNTER — Ambulatory Visit: Admit: 2022-07-05 | Discharge: 2022-07-06 | Payer: BLUE CROSS/BLUE SHIELD

## 2022-07-05 MED ADMIN — gadobenate dimeglumine (MULTIHANCE) 529 mg/mL (0.1mmol/0.2mL) solution 15.5 mL: 15.5 mL | INTRAVENOUS | @ 14:00:00 | Stop: 2022-07-05

## 2022-07-05 MED ADMIN — LORazepam (ATIVAN) injection 1 mg: 1 mg | INTRAVENOUS | @ 14:00:00 | Stop: 2022-07-05

## 2022-07-05 MED FILL — TOPIRAMATE 50 MG TABLET: ORAL | 15 days supply | Qty: 30 | Fill #2

## 2022-07-05 MED FILL — SUMATRIPTAN 50 MG TABLET: ORAL | 30 days supply | Qty: 8 | Fill #1

## 2022-07-05 MED FILL — BIKTARVY 50 MG-200 MG-25 MG TABLET: ORAL | 30 days supply | Qty: 30 | Fill #3

## 2022-08-02 MED ORDER — TOPIRAMATE 50 MG TABLET
ORAL_TABLET | ORAL | 2 refills | 23 days
Start: 2022-08-02 — End: 2023-02-11

## 2022-08-02 NOTE — Unmapped (Signed)
Request received via interface.     Provider: [name]    Last Visit Date: 04/28/2022    Next Visit Date: 08/11/2022    No results found for: CBC, CMP     No results found for this or any previous visit.

## 2022-08-02 NOTE — Unmapped (Signed)
[facility] Refill Coordination Note    Specialty Medication(s) to be Shipped:   Infectious Disease: Biktarvy    Other medication(s) to be shipped: sumatriptan  topiramate       Morgan Hebert, DOB: Nov 01, 1985  Phone: [phone] (home) [phone] (work)      All above HIPAA information was verified with patient.     Was a Nurse, learning disability used for this call? No    Completed refill call assessment today to schedule patient's medication shipment from the [facility] [phone]).  All relevant notes have been reviewed.     Specialty medication(s) and dose(s) confirmed: Regimen is correct and unchanged.   Changes to medications: Morgan Hebert reports no changes at this time.  Changes to insurance: No  New side effects reported not previously addressed with a pharmacist or physician: None reported  Questions for the pharmacist: No    Confirmed patient received a Conservation officer, historic buildings and a Surveyor, mining with first shipment. The patient will receive a drug information handout for each medication shipped and additional FDA Medication Guides as required.       DISEASE/MEDICATION-SPECIFIC INFORMATION        N/A    SPECIALTY MEDICATION ADHERENCE     Medication Adherence    Patient reported X missed doses in the last month: 0  Specialty Medication: biktarvy 50-200-25mg                           Were doses missed due to medication being on hold? No    biktarvy 50-200-25mg   :  Unable to confirm quantity on hand    REFERRAL TO PHARMACIST     Referral to the pharmacist: Not needed      [facility]     Shipping address confirmed in Epic.     Delivery Scheduled: Yes, Expected medication delivery date: 10/27.     Medication will be delivered via Next Day Courier to the prescription address in Epic WAM.    [name]   [facility] Specialty Technician

## 2022-08-04 MED FILL — BIKTARVY 50 MG-200 MG-25 MG TABLET: ORAL | 30 days supply | Qty: 30 | Fill #4

## 2022-08-04 MED FILL — SUMATRIPTAN 50 MG TABLET: ORAL | 30 days supply | Qty: 8 | Fill #2

## 2022-08-06 MED ORDER — TOPIRAMATE 50 MG TABLET
ORAL_TABLET | Freq: Every evening | ORAL | 5 refills | 30 days | Status: CP
Start: 2022-08-06 — End: 2023-02-02
  Filled 2022-08-09: qty 60, 30d supply, fill #0

## 2022-08-11 ENCOUNTER — Telehealth: Admit: 2022-08-11 | Discharge: 2022-08-12 | Payer: BLUE CROSS/BLUE SHIELD

## 2022-08-11 DIAGNOSIS — G932 Benign intracranial hypertension: Principal | ICD-10-CM

## 2022-08-11 MED ORDER — TIZANIDINE 2 MG TABLET
ORAL_TABLET | Freq: Every evening | ORAL | 5 refills | 30 days | Status: CP
Start: 2022-08-11 — End: 2023-02-07
  Filled 2022-09-21: qty 30, 30d supply, fill #0

## 2022-08-11 NOTE — Unmapped (Signed)
-   continue your Topamax 100 mg nightly  - continue your yearly eye exams with neuro-ophthalmology  - we will try a nightly medication for tension type headaches to see if this helps reduce your daily headache, which does not sound like a migraine type headache  - begin Tizanidine 2 mg at nighttime. After 1-2 weeks, if you are not feeling foggy in the morning but if your headache is unchanged, you can increase to 4 mg (2 tabs) each night.  - send me a message in a few weeks to let me know if this helps at all or not  - if this doesn't help, we may need to consider repeating a lumbar puncture to check your pressure and to see if your headache improves after draining some fluid. Unfortunately you have not had a great response to the 2 medications we typically use for IIH and this information could help [place] think about what's next for your treatment  - try to avoid daily over the counter headache or pain medications, as these can contribute to medication overuse headache

## 2022-08-11 NOTE — Unmapped (Addendum)
Neurology Follow-up Visit Note     [facility]  [address]  [address]  [place] [address]    Date: 08/11/2022   Patient Name: Morgan Hebert   MRN: [id number]   PCP: Artelia Laroche  Referring Provider: Artelia Laroche, MD       Assessment and Plan          Morgan Hebert is a 36 y.o. female presenting for follow-up of Headache.     Pseudotumor Cerebri I Migraine HA  IIH was diagnosed in 2019 after 1 year of HA that progressed to include visual changes. Opening pressures on LP have been as high as 40. She has not required LP since 2020 and was initially maintained on Diamox only however she continued to have some headaches despite Diamox dosing as high as 1500 mg BID (this dose was increased from 1000 BID in the setting of pregnancy in 2020). She presented to neurology in November 2022 with worsened HA and 1 month of persistent AM N/V in the setting of being off Diamox therapy for 2 months. Diamox was restarted at 1g BID. She saw neuro-ophthalmology shortly after neurology in 08/2021, who noted a stable eye exam. As of 04/28/22, patient reported no reduction in HA since restarting her Diamox. She continued to have daily headaches and morning nausea. Given headaches did not respond at all to restarting Diamox, we considered other headache types including migraine (photophobia is also sometimes present without vision blurring), tension type HA, and medication overuse HA. We started Topamax, to target IIH and migraine. This has not reduced daily HA however she has had reduction in daily nausea. She is not requiring Imitrex. Given persistent HA that has been nonresponsive to multiple medications, we checked MRI brain and MRV head to rule out vascular causes of HA. There was no evidence of CVST or other intracranial abnormality that could explain her headaches. She does have some mild L > R frontal T2 abnormalities that can likely be attributed to her migraine history (vs small vessel ischemic disease although this is less likely given age). Discussed trial of nightly Tizanidine, as we have not tried tension HA prophylaxis. If this does not work, we may need to repeat LP, although patient is not in favor of this.       - Continue Topamax 100 mg nightly   - Begin Tizanidine 2 mg nightly. If no side effects after 1-2 weeks, increase to 4 mg nightly      Return Visit in: 6 months        The patient reports they are physically located in [place] and is currently: at home. I conducted a audio/video visit. I spent  80m 57s on the video call with the patient. I spent an additional 15 minutes on pre- and post-visit activities on the date of service .     The patient was not located and I was located within 250 yards of a hospital-based location during the real-time audio and video.             Patient was discussed with Dr.Walker who agrees with assessment and plan.    Alwyn Pea, MD   Neurology, PGY-3            ATTESTATION NOTE:  I discussed the patient's case with the resident. I reviewed the resident???s note and agree with the resident???s findings and plan as documented in their note.   Harrold Donath  Janalee Dane, MD        HPI         HPI: Morgan Hebert is a 36 y.o. female who presents for follow up to the [facility] for follow-up of HA. Patient was last seen by [facility] on 04/28/22.      Since last visit, still having daily, non-severe HA. Vision is unchanged. Her nausea improved with change in diet. She is no longer taking Compazine regularly. She is not using Imitrex, as most of her HA are not consistent with migraine type and the Imitrex makes her feel nauseated. We reviewed her imaging results. She does endorse shoulder and neck pain and is willing to try tension HA prophylaxis. She does not want to repeat an LP unless she can be sedated.        Allergies   Allergen Reactions   ??? Peanut Other (See Comments) Patient allergic to walnuts, cashews, pistachios and peanuts in excess.        Current Outpatient Medications   Medication Sig Dispense Refill   ??? acetaminophen (TYLENOL) 325 MG tablet Take 2 tablets (650 mg total) by mouth Every six (6) hours. 120 tablet 0   ??? bictegrav-emtricit-tenofov ala (BIKTARVY) 50-200-25 mg tablet Take 1 tablet by mouth daily. 30 tablet 11   ??? ibuprofen (MOTRIN) 600 MG tablet Take 1 tablet (600 mg total) by mouth Every six (6) hours. 60 tablet 0   ??? omeprazole (PRILOSEC) 20 MG capsule Take 1 capsule (20 mg total) by mouth daily. 90 capsule 3   ??? SUMAtriptan (IMITREX) 50 MG tablet Take 1 tablet (50 mg total) by mouth once as needed for migraine. Do not take more than twice a week 8 tablet 2   ??? topiramate (TOPAMAX) 50 MG tablet Take 2 tablets (100 mg total) by mouth nightly. 60 tablet 5     No current facility-administered medications for this visit.       Past Medical History:   Diagnosis Date   ??? Abnormal mammogram    ??? Constipation    ??? Dental abscess    ??? Dental caries    ??? Diarrhea    ??? 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 Environmental allergies    ??? Gastroesophageal reflux disease 06/07/2022   ??? HIV (human immunodeficiency virus infection) (CMS-HCC)    ??? HIV (human immunodeficiency virus infection) (CMS-HCC)    ??? IIH (idiopathic intracranial hypertension)    ??? IUD (intrauterine device) in place    ??? Lymphadenopathy 02/11/2014   ??? Migraine     daily; uses marijuana and tylenol for management; pseudotumor cerebri   ??? Obesity    ??? Pseudotumor cerebri    ??? Rubella non-immune status, antepartum 01/14/2019    [ ]  PP MMR   ??? STD (sexually transmitted disease)    ??? Supervision of high risk pregnancy, unspecified, unspecified trimester 05/30/2018    Dating: 9/21 (Unsure of LMP as had a D&C for a missed AB in 06/2018 and discovered she was pregnant in 07/2018)  Prenatal Screening:  [x]  Prenatal labs reviewed [x]  SMA 2 copies/Hgb electropheresis wnl/CF [ ]   [x]  1 hr GTT= 250 but done 3 days after BMZ (4/22 and 4/23) --> Repeat 1 hr GTT  5-18= 229  [ ]  28 wk CBC/RPR [ ]  GBS   Fetal Screening:  [x]  aneuploidy scre   ??? Threatened premature labor in third trimester 01/31/2019    Received BM[facility] 4/22 ([place]4/23 [facility]) for concern for PTL. SVE cl/th/hi.   ??? 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, symptomatic sometimes   ??? Tooth sensitivity     ULQ to sugar       Past Surgical History:   Procedure Laterality Date   ??? DILATION AND CURETTAGE OF UTERUS     ??? LUMBAR PUNCTURE     ??? PR CESAREAN DELIVERY ONLY N/A 03/29/2019    Procedure: CESAREAN DELIVERY ONLY;  Surgeon: Asher Muir, MD;  Location: L&D C-SECTION OR SUITES [facility];  Service: Maternal-Fetal Medicine   ??? PR COLONOSCOPY W/BIOPSY SINGLE/MULTIPLE  07/03/2014    Procedure: COLONOSCOPY, FLEXIBLE, PROXIMAL TO SPLENIC FLEXURE; WITH BIOPSY, SINGLE OR MULTIPLE;  Surgeon: Billie Ruddy, MD;  Location: GI PROCEDURES [facility];  Service: Gastroenterology   ??? PR DILATION/CURETTAGE,DIAGNOSTIC N/A 06/14/2018    Procedure: DILATION AND CURETTAGE, DIAGNOSTIC AND/OR THERAPEUTIC (NON OBSTETRICAL);  Surgeon: Nelle Don, MD;  Location: [facility] OR [facility];  Service: [facility] Primary Gynecology   ??? PR LAP,RMV  ADNEXAL STRUCTURE Bilateral 06/04/2019    Procedure: R21  LAPAROSCOPY, SURGICAL; W/REMOVAL OF ADNEXAL STRUCTURES (PARTIAL OR TOTAL OOPHORECTOMY &/OR SALPINGECTOMY);  Surgeon: Joneen Caraway, MD;  Location: [facility] OR [facility];  Service: Family Planning   ??? PR SIGMOIDOSCOPY FLX DX W/COLLJ SPEC BR/WA IF PFRMD N/A 06/25/2020    Procedure: SIGMOIDOSCOPY, FLEXIBLE; DIAGNOSTIC, WITH OR WITHOUT COLLECTION OF SPECIMEN(S) BY BRUSHING OR WASHING;  Surgeon: Bronson Curb, MD;  Location: [facility] PROCEDURES [facility];  Service: Gastroenterology   ??? PR UPPER GI ENDOSCOPY,BIOPSY N/A 07/03/2014    Procedure: UGI ENDOSCOPY; WITH BIOPSY, SINGLE OR MULTIPLE;  Surgeon: Billie Ruddy, MD;  Location: [facility] PROCEDURES [facility];  Service: Gastroenterology   ??? WISDOM TOOTH EXTRACTION         Social History     Socioeconomic History   ??? Marital status: Married     Spouse name: Loraine Leriche    ??? Number of children: 2   Tobacco Use   ??? Smoking status: Former     Packs/day: 0.50     Years: 5.00     Additional pack years: 0.00     Total pack years: 2.50     Types: Cigarettes   ??? Smokeless tobacco: Never   Vaping Use   ??? Vaping Use: Never used   Substance and Sexual Activity   ??? Alcohol use: Yes     Alcohol/week: 4.0 standard drinks of alcohol     Types: 1 Glasses of wine, 1 Shots of liquor, 2 Standard drinks or equivalent per week     Comment: rare   ??? Drug use: Yes     Frequency: 35.0 times per week     Types: Marijuana     Comment: depends on the week for migraines   ??? Sexual activity: Yes     Partners: Male   Social History Narrative    Patient lives in [place], [place] with her Husband and two daughters..  She is not employed.       Family  History   Problem Relation Age of Onset   ??? Cervical cancer Mother    ??? Diabetes Mother    ??? Asthma Mother    ??? Hypertension Mother    ??? Diabetes Maternal Aunt    ??? Hypertension Maternal Aunt    ??? Breast cancer Maternal Grandmother    ??? Cancer Maternal Grandmother    ??? No Known Problems Daughter    ??? Glaucoma Neg Hx             Objective        Vital signs: There were no vitals taken for this visit.       Physical Exam:  General: well appearing. In NAD  Pulm: normal WOB     Video Neurological Examination:     Mental Status: Alert, conversant, able to follow conversation and interview. Spontaneous speech was fluent without word finding pauses, dysarthria, or paraphasic errors. Comprehension was intact. Memory for recent and remote events was intact.    Cranial Nerves: Face symmetric at rest  and with spontaneous activation. Hearing intact. Pupils are equal and round.      Motor Exam:   No tremors, myoclonus, or other adventitious movement.  Moving all extremities equally and spontaneously.    Cerebellar/Coordination/Gait: Grossly smooth movements of BUE      Diagnostic Studies     Last CBC:   Lab Results   Component Value Date    WBC 7.7 12/14/2021    HGB 13.7 06/16/2022    HCT 38.8 12/14/2021    PLT 304 12/14/2021    LYMPHSABS 3.5 (H) 12/14/2021    MONOSABS 0.6 12/14/2021    BANDABS 0.0 12/14/2021    LYMPHOPCT 45 12/14/2021    MONOPCT 8 12/14/2021     Last Chem:   Lab Results   Component Value Date    NA 142 12/14/2021    K 4.1 12/14/2021    BUN 12 12/14/2021    CREATININE 0.90 12/14/2021    GLU 79 08/18/2021     Last LFTs:   Lab Results   Component Value Date    AST 18 12/14/2021    ALT 19 12/14/2021    ALKPHOS 76 12/14/2021    GGT 16 12/14/2021    BILITOT <0.2 12/14/2021    PROT 7.4 12/14/2021    ALBUMIN 4.4 02/19/2020       MRI brain, MRV head, personally reviewed as above      The left sigmoid and transverse sinus are slightly smaller than the right, which is a normal anatomic variant. There is mild narrowing of the distal transverse sinuses bilaterally. No evidence of VST.     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 Finding is nonspecific but can be seen with idiopathic intracranial hypertension.      Scattered foci of T2/FLAIR white matter hyperintensity in the right frontal lobe, greater than expected for the patient's stated age. These most commonly represent chronic small vessel ischemic changes, however the finding is nonspecific.     *on personal review, T2 changes in frontal lobe are L > R

## 2022-08-16 MED ORDER — BIKTARVY 50 MG-200 MG-25 MG TABLET
ORAL_TABLET | Freq: Every day | ORAL | 11 refills | 30 days
Start: 2022-08-16 — End: 2023-08-16

## 2022-08-16 NOTE — Unmapped (Unsigned)
r     [facility]  [address]  [place], [place]  [address]  P [phone]  F [phone]     Primary care provider: Artelia Laroche, MD    Assessment/Plan:      Morgan Hebert, a 36 y.o. female seen today for routine HIV followup.    Plan:  HIV  Fills ART via private insurance. RW renewal needed for this period (caps on charges, to meet with benefits counselor today).  Continue current therapy. E-prescribed today. She uses [facility].  Labs drawn through study 06/2022    Encouraged continued excellent ARV adherence.   Lab Results   Component Value Date    ACD4 1,118 10/22/2018    CD4 54 10/22/2018    HIVCP <20 06/24/2020    HIVRS Not Detected 08/18/2021   06/16/22  HIV VL <20   05/24/22  RPR Neg  GC/CT neg   12/14/21  CD4 1987 (55%)  HIV VL <20   RPR Neg  GC/CT neg   GFR/LFTs WNL   a1c 5.5  Lipids: TG 166 (had coffee w/sugar), HDL 39     Pseudotumor cerebri, headaches  Sees [facility], appt with [name] and Dr. Hampton Abbot in the coming month. Her symptoms come and go. Severity of headaches is unchanged.   We have discussed medication use and possible role of marijuana in exacerbating her headaches and nausea (the latter is under better control lately).   For ophtho followup with Dr. Darel Hong.     COVID-19  Prolonged discussion today. She has received one vaccine. She feels that since she works at home and is careful about who she is around, wears a mask and gloves her risk is lower. While this may be true we discussed that not all exposures are controllable and that the vaccine reduces risk of hospitalization, serious illness, late complications.  I reiterated my strong recommendation that she access this.     Mental Health  Provided support, in therapy locally. Symptoms are stable, exacerbated by stress.  Overall doing well and feels safe.     Sexual health & secondary prevention  Sex with men. Monogamous with single partner. She does disclose status. Never uses condoms, we have discussed U=U. Has BTL.    Lab Results   Component Value Date    RPR Non Reactive 05/24/2022    LABRPR Nonreactive 06/12/2020    CTNAA Negative 05/24/2022    CTNAA Negative 05/24/2022    CTNAA Negative 12/14/2021    GCNAA Negative 05/24/2022    GCNAA Negative 05/24/2022    GCNAA Negative 12/14/2021    SPECTYPE Urine 10/28/2020    SPECTYPE Swab 10/28/2020    SPECTYPE Swab 10/28/2020    SPECSOURCE Urine 10/28/2020    SPECSOURCE Throat 10/28/2020    SPECSOURCE Rectum 10/28/2020     GC/CT NAATs --  Negative 12/2021    RPR -- NR 12/2021 - repeat 1Y      Health maintenance  Lab Results   Component Value Date    CREATININE 0.90 12/14/2021    QFTTBGOLD NEGATIVE 02/13/2014    HEPCAB Nonreactive 10/28/2020    CHOL 164 12/14/2021    HDL 39 (L) 12/14/2021    LDL 96 12/14/2021    NONHDL 125 12/14/2021    TRIG 166 (H) 12/14/2021    A1C 5.5 12/14/2021    PAP Negative for intraephithelial lesion or malignancy 03/29/2017    FINALDX  03/29/2019     A: Placenta, delivery    -  Preterm (36 weeks 1 days EGA) singleton third trimester placenta, weight 320 g (weight 10th percentile for EGA)   - No acute chorioamnionitis identified   - Three vessel umbilical cord with narrow diameter (0.9 cm) and a furcate insertion  - Placental plate: accelerated maturation of chorionic villi,  increased intervillous and subchorionic fibrin, focal fetal thrombotic vasculopathy, small placental infarction (1.2 cm)          This electronic signature is attestation that the pathologist personally reviewed the submitted material(s) and the final diagnosis reflects that evaluation.       Health maintenance  - chronic, stable    Oral health  She does  have a dentist. Last dental exam within past year.    Eye health  She does  use corrective lenses. Last eye exam within past year.    Metabolic conditions  Wt Readings from Last 5 Encounters:   06/07/22 77.5 kg (170 lb 12.8 oz)   04/28/22 76.7 kg (169 lb)   03/09/22 76.2 kg (168 lb)   08/18/21 72.7 kg (160 lb 4.4 oz)   08/18/21 72.7 kg 76.2 kg (168 lb)   08/18/21 72.7 kg (160 lb 4.4 oz)   08/18/21 72.7 kg (160 lb 3.2 oz)     Lab Results   Component Value Date    CREATININE 0.90 12/14/2021    PROTEINUA Negative 02/19/2020    PROTEINUR <4.0 03/29/2019    GLUCOSEU Negative 02/19/2020    PCRATIOUR  03/29/2019      Comment:      Unable to calculate due to value below lower limit of assay linearity.    GLU 79 08/18/2021    A1C 5.5 12/14/2021    ALT 19 12/14/2021    ALT <7 (L) 08/18/2021    ALT 13 12/04/2020     # Kidney health - urine studies needed but deferred to future visit  # Bone health - assessment not yet needed (under age 60)  # Diabetes assessment -  Recent A1c WNL (12/2021)  # NAFLD assessment - monitor over time    Communicable diseases  Lab Results   Component Value Date    QFTTBGOLD NEGATIVE 02/13/2014    HEPAIGG Nonreactive 02/13/2014    HEPBSAB Reactive 02/13/2014    HEPCAB Nonreactive 10/28/2020    RUBIG Negative 05/30/2018    VZVIGG Positive 05/30/2018     # TB screening - no longer needed; negative IGRA, low risk  # Hepatitis screening - repeat HCV screen periodically  # MMR screening -  Rubella nonimmune 2019 - got MMR x 1 2020, needs recheck    Cancer screening  Lab Results   Component Value Date    PAP Negative for intraephithelial lesion or malignancy 03/29/2017    FINALDX  03/29/2019     A: Placenta, delivery    - Preterm (36 weeks 1 days EGA) singleton third trimester placenta, weight 320 g (weight 10th percentile for EGA)   - No acute chorioamnionitis identified   - Three vessel umbilical cord with narrow diameter (0.9 cm) and a furcate insertion  - Placental plate: accelerated maturation of chorionic villi,  increased intervillous and subchorionic fibrin, focal fetal thrombotic vasculopathy, small placental infarction (1.2 cm)          This electronic signature is attestation that the pathologist personally reviewed the submitted material(s) and the final diagnosis reflects that evaluation.       # Cervical - repeat Pap with HPV testing 3 years from prior - completed 2022  #  Breast - no indication for screening at present    # Anorectal - not yet done  # Colorectal - screening not indicated  # Liver - no screening indicated  # Lung - screening not indicated    Cardiovascular disease  Lab Results   Component Value Date    CHOL 164 12/14/2021    HDL 39 (L) 12/14/2021    LDL 96 12/14/2021    NONHDL 125 12/14/2021    TRIG 166 (H) 12/14/2021     # The ASCVD Risk score (Arnett DK, et al., 2019) failed to calculate.  - is not taking aspirin   - is not taking statin  - BP control excellent  - former smoker  # AAA screening - no indication for screening    Immunization History   Administered Date(s) Administered    COVID-19 VACC,MRNA,(PFIZER)(PF) 10/28/2020    Influenza Vaccine Quad (IIV4 PF) 37mo+ injectable 06/30/2015, 06/15/2016, 07/04/2018, 10/28/2020, 08/18/2021    MMR 03/31/2019    PNEUMOCOCCAL POLYSACCHARIDE 23-VALENT 03/31/2019    TdaP 02/05/2019     Immunizations today -  Discussed need for bivalent COVID booster and patient declines recommended immunization(s). Advised to get COVID, flu, PCV20 in coming month. She does not want to get shots today as she has to go to work.     I personally spent 45 minutes face-to-face and non-face-to-face in the care of this patient, which includes all pre, intra, and post visit time on the date of service.  All documented time was specific to the E/M visit and does not include any procedures that may have been performed.  Counseled as documented above regarding mental health, COVID-19 vaccine, nausea management, medication adherence, weight management and healthy lifestyle choices and need for recommended screening tests.    Disposition  Return to clinic 5-6 months or sooner if needed.    [name], MD, MPH   [facility] at [facility]  [address]   [place], [place] [address]  Phone: [phone]   Fax: [phone]    To do on RTC:  MMR   PCV-20  Linkage to MH?     Subjective: and missed her appointment with Dr Brooke Dare.  Back together with her husband Loraine Leriche - things are going well. Toddler [name]) is doing great. Older daughter Gordy Savers) is as well, has custody of niece Lucita Lora) which has been rocky (older sister of niece who died, mom just released from jail) but this is going much more smoothly. Older girls are in 10th and 11th grades.  No more cigarettes, only marijuana  Only one COVID vaccine so far.     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; reduced carrier risk [x]  Hemoglobinopathy screening - normal adult hemoglobin  WGS study eligible: no  GENETIC COUNSELING PREVISIT SUMMARY Referring provider: [facility]  Indication: Aneuploidy screening Oth    Environmental allergies     Gastroesophageal reflux disease 06/07/2022    HIV (human immunodeficiency virus infection) (CMS-HCC)     HIV (human immunodeficiency virus infection) (CMS-HCC)     IIH (idiopathic intracranial hypertension)     IUD (intrauterine  device) in place     Lymphadenopathy 02/11/2014    Migraine     daily; uses marijuana and tylenol for management; pseudotumor cerebri    Obesity     Pseudotumor cerebri     Rubella non-immune status, antepartum 01/14/2019    [ ]  PP MMR    STD (sexually transmitted disease)     Supervision of high risk pregnancy, unspecified, unspecified trimester 05/30/2018    Dating: 9/21 (Unsure of LMP as had a D&C for a missed AB in 06/2018 and discovered she was pregnant in 07/2018)                                    Prenatal Screening:  [x]  Prenatal labs reviewed [x]  SMA 2 copies/Hgb electropheresis wnl/CF [ ]   [x]  1 hr GTT= 250 but done 3 days after BMZ (4/22 and 4/23) --> Repeat 1 hr GTT  5-18= 229  [ ]  28 wk CBC/RPR [ ]  GBS   Fetal Screening:  [x]  aneuploidy scre    Threatened premature labor in third trimester 01/31/2019    Received BMZ 4/22 ()-4/23 [facility]) for concern for PTL. SVE cl/th/hi.    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 See bottom of this visit's encounter summary for details.  Current Outpatient Medications on File Prior to Visit   Medication Sig    acetaminophen (TYLENOL) 325 MG tablet Take 2 tablets (650 mg total) by mouth Every six (6) hours.    bictegrav-emtricit-tenofov ala (BIKTARVY) 50-200-25 mg tablet Take 1 tablet by mouth daily.    ibuprofen (MOTRIN) 600 MG tablet Take 1 tablet (600 mg total) by mouth Every six (6) hours.    omeprazole (PRILOSEC) 20 MG capsule Take 1 capsule (20 mg total) by mouth daily.    SUMAtriptan (IMITREX) 50 MG tablet Take 1 tablet (50 mg total) by mouth once as needed for migraine. Do not take more than twice a week    tizanidine (ZANAFLEX) 2 MG tablet Take 1 tablet (2 mg total) by mouth nightly.    topiramate (TOPAMAX) 50 MG tablet Take 2 tablets (100 mg total) by mouth nightly.     No current facility-administered medications on file prior to visit.     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     Social History  General - lives in [place] with her husband and her 2 daughters (born 2008, 2020). Also has custody of niece (2007).   Doreene Adas and his girlfriend are there sometimes as well.   2021 - Family trauma, her 12yo niece committed suicide and her aunt passed away from cancer.   Mom and sister are both positive (sister is perinatally infected, patient with presumed sexual transmission from female partner).  Works at home for [employer], dreams of becoming a Clinical research associate or a Therapist, music. Loves vampire shows like True Blood.   Sexual History - sex with men (husband only)  Substance Use - marijuana (smokes nightly), smoked maybe 3 tobacco cigarettes a day but quit in 2022.  Social History     Tobacco Use    Smoking status: Former     Packs/day: 0.50  Years: 5.00     Additional pack years: 0.00     Total pack years: 2.50     Types: Cigarettes    Smokeless tobacco: Never   Substance Use Topics    Alcohol use: Yes     Alcohol/week: 4.0 standard drinks of alcohol     Types: 1 Glasses of wine, 1 Shots of liquor, 2 Standard drinks or equivalent per week     Comment: rare     Review of Systems  As per HPI. Remainder of 10 systems reviewed, negative.      Objective:      There were no vitals taken for this visit.  Wt Readings from Last 3 Encounters:   06/07/22 77.5 kg (170 lb 12.8 oz)   04/28/22 76.7 kg (169 lb)   03/09/22 76.2 kg (168 lb)     Const looks well and attentive, alert, appropriate   Eyes sclerae anicteric, noninjected OU   ENT Well-healed molar extraction site (top left), no lesions   Lymph no cervical or supraclavicular LAD   CV RRR. No murmurs. No rub or gallop. S1/S2.   Lungs CTAB ant/post, normal work of breathing   GI Soft, no organomegaly. NTND. NABS.   GU deferred   Rectal deferred   Skin no petechiae, ecchymoses or rashes on full inspection. L buttock w large pedunculated skin tag.    MSK no joint tenderness and normal ROM throughout. No LE edema, feet warm with brisk DP/PT pulses.   Neuro CN II-XII grossly intact, MAEE, non focal   Psych Appropriate affect. Eye contact good. Linear thoughts. Fluent speech.     Laboratory Data  Reviewed in Epic today, using Synopsis and Chart Review filters.  05/06/21 Lab [name] - Study of Treatment and Reproductive Health Outcomes (STAR)   STI screen negative (CT, GC from 3 sites, cervical trich)  RPR non reactive  HIV RNA <20  CD4 1496 (52%)    Lab Results   Component Value Date    CREATININE 0.90 12/14/2021    QFTTBGOLD NEGATIVE 02/13/2014    HEPCAB Nonreactive 10/28/2020    CHOL 164 12/14/2021    HDL 39 (L) 12/14/2021    LDL 96 12/14/2021    NONHDL 125 12/14/2021    TRIG 166 (H) 12/14/2021    A1C 5.5 12/14/2021    PAP Negative for intraephithelial lesion or malignancy 03/29/2017    FINALDX  03/29/2019     A: Placenta, delivery    - Preterm (36 weeks 1 days EGA) singleton third trimester placenta, weight 320 g (weight 10th percentile for EGA)   - No acute chorioamnionitis identified   - Three vessel umbilical cord with narrow diameter (0.9 cm) and a furcate insertion  - Placental plate: accelerated maturation of chorionic villi,  increased intervillous and subchorionic fibrin, focal fetal thrombotic vasculopathy, small placental infarction (1.2 cm)          This electronic signature is attestation that the pathologist personally reviewed the submitted material(s) and the final diagnosis reflects that evaluation.              _____________________________________________________________________

## 2022-08-17 ENCOUNTER — Ambulatory Visit: Admit: 2022-08-17 | Discharge: 2022-08-18 | Payer: BLUE CROSS/BLUE SHIELD

## 2022-08-17 DIAGNOSIS — B20 Human immunodeficiency virus [HIV] disease: Principal | ICD-10-CM

## 2022-08-17 DIAGNOSIS — G932 Benign intracranial hypertension: Principal | ICD-10-CM

## 2022-08-17 MED ORDER — BIKTARVY 50 MG-200 MG-25 MG TABLET
ORAL_TABLET | Freq: Every day | ORAL | 11 refills | 30 days | Status: CP
Start: 2022-08-17 — End: 2023-08-17
  Filled 2022-09-21: qty 90, 90d supply, fill #0

## 2022-09-20 MED ORDER — TOPIRAMATE 50 MG TABLET
ORAL_TABLET | Freq: Every evening | ORAL | 3 refills | 90 days
Start: 2022-09-20 — End: 2023-03-19

## 2022-09-20 NOTE — Unmapped (Signed)
[facility] Refill Coordination Note    Specialty Medication(s) to be Shipped:   Infectious Disease: Biktarvy    Other medication(s) to be shipped:  topiramate 50mg , omeprazole 20mg , tizanidine 2mg ,      Morgan Hebert, DOB: 1986/06/18  Phone: [phone] (home) [phone] (work)      All above HIPAA information was verified with patient.     Was a Nurse, learning disability used for this call? No    Completed refill call assessment today to schedule patient's medication shipment from the [facility] [phone]).  All relevant notes have been reviewed.     Specialty medication(s) and dose(s) confirmed: Regimen is correct and unchanged.   Changes to medications: Tere reports no changes at this time.  Changes to insurance: No  New side effects reported not previously addressed with a pharmacist or physician: None reported  Questions for the pharmacist: No    Confirmed patient received a Conservation officer, historic buildings and a Surveyor, mining with first shipment. The patient will receive a drug information handout for each medication shipped and additional FDA Medication Guides as required.       DISEASE/MEDICATION-SPECIFIC INFORMATION        N/A    SPECIALTY MEDICATION ADHERENCE     Medication Adherence    Patient reported X missed doses in the last month: 0  Specialty Medication: biktarvy 50-200-25mg   Patient is on additional specialty medications: No  Patient is on more than two specialty medications: No  Demonstrates understanding of importance of adherence: yes  Informant: patient  Reliability of informant: reliable  Provider-estimated medication adherence level: good  Patient is at risk for Non-Adherence: No  Reasons for non-adherence: no problems identified                                Were doses missed due to medication being on hold? No    BIKTARVY 50-200-25 mg tablet (bictegrav-emtricit-tenofov ala)  : 0 days of medicine on hand       REFERRAL TO PHARMACIST     Referral to the pharmacist: Not needed      [facility]     Shipping address confirmed in Epic.     Delivery Scheduled: Yes, Expected medication delivery date: 09/21/22.     Medication will be delivered via Same Day Courier to the prescription address in Epic WAM.    Stanislav Gervase' W Wilhem[facility] Specialty Technician

## 2022-09-21 MED ORDER — TOPIRAMATE 50 MG TABLET
ORAL_TABLET | Freq: Every evening | ORAL | 1 refills | 90 days | Status: CP
Start: 2022-09-21 — End: 2023-03-20
  Filled 2022-09-21: qty 180, 90d supply, fill #0

## 2022-09-21 MED FILL — OMEPRAZOLE 20 MG CAPSULE,DELAYED RELEASE: ORAL | 90 days supply | Qty: 90 | Fill #1

## 2022-09-21 NOTE — Unmapped (Signed)
Updated Topamax script for 90 day fill per pharmacy request

## 2022-09-22 NOTE — Unmapped (Signed)
12.14.2023      Patient Call  RE: ADA paperwork needing completion/ clinical fax number given to patient   Transferred to Nurse Line   For further discussion

## 2022-11-04 MED FILL — TIZANIDINE 2 MG TABLET: ORAL | 30 days supply | Qty: 30 | Fill #1

## 2022-12-09 NOTE — Unmapped (Signed)
[facility] Clinical Assessment & Refill Coordination Note    Morgan Hebert, DOB: 08/18/1986  Phone: [phone] (home) [phone] (work)    All above HIPAA information was verified with patient.     Was a Nurse, learning disability used for this call? No    Specialty Medication(s):   Infectious Disease: Biktarvy     Current Outpatient Medications   Medication Sig Dispense Refill    acetaminophen (TYLENOL) 325 MG tablet Take 2 tablets (650 mg total) by mouth Every six (6) hours. 120 tablet 0    bictegrav-emtricit-tenofov ala (BIKTARVY) 50-200-25 mg tablet Take 1 tablet by mouth daily. 90 tablet 3    ibuprofen (MOTRIN) 600 MG tablet Take 1 tablet (600 mg total) by mouth Every six (6) hours. 60 tablet 0    omeprazole (PRILOSEC) 20 MG capsule Take 1 capsule (20 mg total) by mouth daily. 90 capsule 3    tizanidine (ZANAFLEX) 2 MG tablet Take 1 tablet (2 mg total) by mouth nightly. 30 tablet 5    topiramate (TOPAMAX) 50 MG tablet Take 2 tablets (100 mg total) by mouth nightly. 180 tablet 1     No current facility-administered medications for this visit.        Changes to medications: Morgan Hebert reports no changes at this time.    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       Changes to allergies: No    SPECIALTY MEDICATION ADHERENCE     Biktarvy 50-200-25 mg: unable to verify the number of  days of medicine on hand     Medication Adherence    Patient reported X missed doses in the last month: 0  Specialty Medication: Biktarvy 50-200-25mg   Any gaps in refill history greater than 2 weeks in the last 3 months: no  Demonstrates understanding of importance of adherence: yes  Informant: patient  Provider-estimated medication adherence level: good  Patient is at risk for Non-Adherence: Yes          Specialty medication(s) dose(s) confirmed: Regimen is correct and unchanged.     Are there any concerns with adherence? No    Adherence counseling provided? Not needed    CLINICAL MANAGEMENT AND INTERVENTION      Clinical Benefit Assessment:    Do you feel the medicine is effective or helping your condition? Yes    HIV ASSOCIATED LABS:     0 Result Notes               Component  Ref Range & Units 5 mo ago  (06/16/22) 12 mo ago  (12/14/21) 1 yr ago  (05/06/21) 2 yr ago  (12/04/20) 2 yr ago  (05/07/20) 3 yr ago  (12/02/19) 4 yr ago  (03/28/18)    HIV-1 RNA by PCR  copies/mL <20 <20 CM <20 CM <20 CM <20 CM <20 CM 270 CM   Comment: HIV-1 RNA not detected  The reportable range for this assay is 20 to 10,000,000  copies HIV-1 RNA/mL.    log10 HIV-1 RNA  log10copy/mL CANCELED CANCELED CM CANCELED CM CANCELED CM CANCELED CM CANCELED CM 2.431   Comment: Unable to calculate result since non-numeric result obtained for  component test.    Result canceled by the ancillary.   Resulting Agency 01 01 01 01 01 01 01              Narrative  Performed by: Verdell Carmine  Performed at:  [address] - [facility]  [address], [place], [place]  [id number]  Lab Director: Jolene Schimke MD, Phone:  [phone]      Specimen Collected: 06/16/22 10:20 Last Resulted: 06/18/22 07:37                 Lab Results   Component Value Date/Time    HIVRS Not Detected 08/18/2021 05:05 PM    HIVRS Not Detected 10/28/2020 10:17 AM    HIVRS Detected (A) 02/25/2019 10:12 AM    HIVRS 42,400 10/23/2017 12:00 AM    HIVRS not detected 03/29/2017 12:00 AM    HIVRS detected 10/25/2016 12:00 AM    HIVCP <20 06/24/2020 08:23 AM    HIVCP <20 06/12/2020 11:43 AM    HIVCP <40 (H) 02/25/2019 10:12 AM    HIVCP <40 (H) 07/04/2018 02:06 PM    HIVCP detected 10/23/2017 12:00 AM    RCD4 54.4 05/01/2015 12:00 AM    RCD4 53.7 10/24/2014 12:00 AM    ACD4 1,118 10/22/2018 09:45 AM    ACD4 1,696 (A) 05/01/2015 12:00 AM    ACD4 967 10/24/2014 12:00 AM    ACD4 854 06/23/2014 12:26 PM       Clinical Benefit counseling provided? Labs from 06/16/22 show evidence of clinical benefit    Adverse Effects Assessment:    Are you experiencing any side effects? No    Are you experiencing difficulty administering your medicine? No    Quality of Life Assessment:        How many days over the past month did your HIV  keep you from your normal activities? For example, brushing your teeth or getting up in the morning. 0    Have you discussed this with your provider? Not needed    Acute Infection Status:    Acute infections noted within Epic:  No active infections  Patient reported infection: None    Therapy Appropriateness:    Is therapy appropriate and patient progressing towards therapeutic goals? Yes, therapy is appropriate and should be continued    DISEASE/MEDICATION-SPECIFIC INFORMATION      N/A    HIV: Not Applicable    PATIENT SPECIFIC NEEDS     Does the patient have any physical, cognitive, or cultural barriers? No    Is the patient high risk? No    Did the patient require a clinical intervention? No    Does the patient require physician intervention or other additional services (i.e., nutrition, smoking cessation, social work)? No    SOCIAL DETERMINANTS OF HEALTH     At the [facility], we have learned that life circumstances - like trouble affording food, housing, utilities, or transportation can affect the health of many of our patients.   That is why we wanted to ask: are you currently experiencing any life circumstances that are negatively impacting your health and/or quality of life? Patient declined to answer    Social Determinants of Health     Financial Resource Strain: Low Risk  (02/07/2019)    Received from [facility]    Overall Financial Resource Strain (CARDIA)     Difficulty of Paying Living Expenses: Not hard at all   Internet Connectivity: Not on file   Food Insecurity: No Food Insecurity (02/07/2019)    Received from [facility]    Hunger Vital Sign     Worried About Running Out of Food in the Last Year: Never true     Ran Out of  Food in the Last Year: Never true   Tobacco Use: Medium Risk (08/17/2022)    Patient History     Smoking Tobacco Use: Former     Smokeless Tobacco Use: Never     Passive Exposure: Not on file   Housing/Utilities: Unknown (01/17/2021)    Housing/Utilities     Within the past 12 months, have you ever stayed: outside, in a car, in a tent, in an overnight shelter, or temporarily in someone else's home (i.e. couch-surfing)?: No     Are you worried about losing your housing?: Not on file     Within the past 12 months, have you been unable to get utilities (heat, electricity) when it was really needed?: Not on file   Alcohol Use: Not At Risk (02/11/2020)    Alcohol Use     How often do you have a drink containing alcohol?: 2 - 4 times per month     How many drinks containing alcohol do you have on a typical day when you are drinking?: 1 - 2     How often do you have 5 or more drinks on one occasion?: Never   Transportation Needs: No Transportation Needs (02/07/2019)    Received from [facility] - Transportation     Lack of Transportation (Medical): No     Lack of Transportation (Non-Medical): No   Substance Use: Medium Risk (06/07/2022)    Substance Use     Taken prescription drugs for non-medical reasons: Never     Taken illegal drugs: Daily or Almost Daily     Patient indicated they have taken drugs in the past year for non-medical reasons: Yes, [positive answer(s)]: Yes   Health Literacy: Low Risk  (01/17/2021)    Health Literacy     : Never   Physical Activity: Inactive (02/07/2019)    Received from [facility]    Exercise Vital Sign     Days of Exercise per Week: 0 days     Minutes of Exercise per Session: 0 min   Interpersonal Safety: Not on file   Stress: No Stress Concern Present (02/07/2019)    Received from [facility] - Occupational Stress Questionnaire     Feeling of Stress : Not at all   Intimate Partner Violence: Not At Risk (02/07/2019)    Received from [facility]    Humiliation, Afraid, Rape, and Kick questionnaire Fear of Current or Ex-Partner: No     Emotionally Abused: No     Physically Abused: No     Sexually Abused: No   Depression: Not at risk (04/28/2022)    PHQ-2     PHQ-2 Score: 0   Social Connections: Unknown (02/07/2019)    Received from [facility]    Social Connection and Isolation Panel [NHANES]     Frequency of Communication with Friends and Family: Patient refused     Frequency of Social Gatherings with Friends and Family: Patient refused     Attends Religious Services: Patient refused     Database administrator or Organizations: Patient refused     Attends Banker Meetings: Patient refused     Marital Status: Patient refused       Would you be willing to receive help with any of the needs that you have identified today? Not applicable       SHIPPING     Specialty Medication(s) to be Shipped:   Infectious  Disease: Biktarvy    Other medication(s) to be shipped:   Tizanidine 2mg    Topirimate 50mg   Omeprazole 20mg      Changes to insurance: No    Patient was informed of new phone menu: Yes    Delivery Scheduled: Yes, Expected medication delivery date: 12/13/22.     Medication will be delivered via Same Day Courier to the confirmed prescription address in [facility].    The patient will receive a drug information handout for each medication shipped and additional FDA Medication Guides as required.  Verified that patient has previously received a Conservation officer, historic buildings and a Surveyor, mining.    The patient or caregiver noted above participated in the development of this care plan and knows that they can request review of or adjustments to the care plan at any time.      All of the patient's questions and concerns have been addressed.    Roderic Palau, PharmD   [facility] Specialty Pharmacist

## 2022-12-13 MED FILL — OMEPRAZOLE 20 MG CAPSULE,DELAYED RELEASE: ORAL | 90 days supply | Qty: 90 | Fill #2

## 2022-12-13 MED FILL — TIZANIDINE 2 MG TABLET: ORAL | 30 days supply | Qty: 30 | Fill #2

## 2022-12-13 MED FILL — TOPIRAMATE 50 MG TABLET: ORAL | 90 days supply | Qty: 180 | Fill #1

## 2022-12-13 MED FILL — BIKTARVY 50 MG-200 MG-25 MG TABLET: ORAL | 90 days supply | Qty: 90 | Fill #1

## 2022-12-15 NOTE — Unmapped (Signed)
Morgan Hebert is a 37 y.o. female here for a MWCCS Study Visit (IRB [phone]) on 12/15/2022. HIV positive participant. Denies complaints today. Pt is engaged in care at [facility] and [facility], takes [employer] for ARVs.     Significant Medical History: Migraines, GERD, Pseudotumor Cerebri   PCP: [name] at [facility]      Negative COVID-19 Screen: Wellness screening and temperature check conducted at clinic door. Participant reported no COVID-19 symptoms, no contact to a known or suspected COVID-19 case in the last 14 days, and temperature was within normal limits. Participant was cleared to enter the facility.     Study visit included:    Pregnancy Test: negative    Physical Exam:  Blood Pressure, Height/Weight, In-Body Analysis , and BIA Body Measurements  and dental photos     Gyn Exam:   No lesions, cysts, or nodules, detected in the external genitalia. Skin color consistent with race, no erythema or swelling noted. Labia majora are symmetrical, and well formed. Clitoris intact with good hygiene. Labia minora are dark pink, moist and symmetrical. Cervical opening is smooth, pink and even, no redness, inflammation or cyanosis detected. Cervix midline, Os is small and round without visible lesions. Vaginal wall is pink, deeply rugated, moist and smooth without inflammation or lesions. Bi-manual exam deferred and not part of study protocol.      GYN specimens collected: Thin prep pap w/reflex  Wet prep: negative    Labs Collected: CMP, CBC w/diff, CD4, HIV Viral Load, fasting glucose, HgA1C, fasting lipids, RPR cascade. Urine: GC/CH    All labs results will be documented in Epic.

## 2022-12-20 NOTE — Unmapped (Signed)
Lab results reviewed with patient    CBC WNL  CMP WNL  Lipids HDL 39, counseled briefly on diet   A1C 5.5  RPR Neg  GC/CHL Neg   CD4 1632   HIV RNA <20  Pap pending    Will forward results to PCP  Pt agrees and understands

## 2022-12-27 NOTE — Unmapped (Signed)
Pap NILM   Will forward to PCP as Lorain Childes

## 2023-01-18 ENCOUNTER — Ambulatory Visit: Admit: 2023-01-18 | Discharge: 2023-01-19 | Payer: BLUE CROSS/BLUE SHIELD

## 2023-01-18 DIAGNOSIS — E669 Obesity, unspecified: Principal | ICD-10-CM

## 2023-01-18 DIAGNOSIS — G932 Benign intracranial hypertension: Principal | ICD-10-CM

## 2023-01-18 DIAGNOSIS — B20 Human immunodeficiency virus [HIV] disease: Principal | ICD-10-CM

## 2023-01-18 DIAGNOSIS — Z6833 Body mass index (BMI) 33.0-33.9, adult: Principal | ICD-10-CM

## 2023-01-18 MED ORDER — BIKTARVY 50 MG-200 MG-25 MG TABLET
ORAL_TABLET | Freq: Every day | ORAL | 3 refills | 90 days | Status: CP
Start: 2023-01-18 — End: 2024-01-18
  Filled 2023-03-10: qty 90, 90d supply, fill #0

## 2023-01-18 NOTE — Unmapped (Addendum)
It was great to see you today.    Contacting us   During working hours  [phone]  After hours or weekends [phone] and ask for the ID doctor on call  Fax number   [phone]    MEDICATIONS  For refills, please contact your pharmacy and ask them to electronically send or fax the request to the clinic.     Please bring all medications in original bottles to every appointment.    [facility] (formerly [employer]) or [employer] Eligibility (required even if you do not receive medication through [facility])  Please remember to renew your Juanell Fairly eligibility during renewal periods which occur twice a year: January-March and July-September.     The following are needed for each renewal:   - [facility] Identification (if you don't have one, then a bill with your name and address in [place])   - proof of income (award letter, W-2, or last three check stubs)   If you are unable to come in for renewal, let us know if we can mail, fax or e-mail paperwork to you.   [facility]MAP Contact: [phone]      Urgent Care Clinic  Monday, Tuesday, and Thursday from 8:30 - 12 noon  Please call ahead to speak with the nursing staff if you think you need to be seen urgently!    Lab info:  Your most recent CD4 T-cell counts and viral loads are below. Here are a few things to keep in mind when looking at your numbers:  For most people, we're checking CD4 counts fairly infrequently (once a year or less)  It's normal for your CD4 count to be different from visit to visit.   We consider your viral load to be undetectable if it says <20 or if it says Not detected.   Our goal is to get your virus to be undetectable and keep it undetectable. You can help by taking your medications at about the same time, every single day. If you're having trouble with taking your medications, it's important to let us know.    YOUR RECENT LAB RESULTS:  CD4 and VIRAL LOAD  Lab Results   Component Value Date    ACD4 1,118 10/22/2018    HIVRS Not Detected 08/18/2021       [name], MD, MPH   [facility] at [facility]  [address]   [place], [place] [address]  Phone: [phone]   Fax: [phone]

## 2023-01-18 NOTE — Unmapped (Signed)
r     [facility]  [address]  [place], [place]  [address]  P [phone]  F [phone]     Primary care provider: Artelia Laroche, MD    Assessment/Plan:      Morgan Hebert, a 37 y.o. female seen today for routine HIV followup.    Plan:  HIV  Fills ART via private insurance. RW renewal UTD (caps on charges)  Continue current therapy. E-prescribed today. She uses [facility].  Labs drawn through study 12/2022  Encouraged continued excellent ARV adherence.   Lab Results   Component Value Date    ACD4 1,118 10/22/2018    CD4 54 10/22/2018    HIVCP <20 06/24/2020    HIVRS Not Detected 08/18/2021   12/15/22  CBC WNL  CMP WNL  Lipids HDL 39, LDL <90  A1C 5.5  RPR Neg  GC/CHL Neg (cervicovaginal)  CD4 1632 (54%)  HIV RNA <20  Pap NILM no HPV performed     Pseudotumor cerebri, chronic headaches  Sees [facility], appt with [name]   MRI brain and MRV head completed 07/05/22, symptoms are stable  We have previously discussed medication use and possible role of marijuana in exacerbating her headaches and nausea (the latter is under better control lately).   For ophtho followup     COVID-19  Discussed again today. Counseled re the CDC's current recommendations (as of Sept 2023) to access updated Pfizer-BioNTech or Moderna COVID-19 vaccine to protect against sequelae of COVID-19, including hospitalization and death.   She received one dose in 10/2020. She does not think she is at risk and prefers to defer.  I reiterated my strong recommendation that she access this.     Mental Health  Symptoms are stable, exacerbated by stress. Feels need is less acute as kids are doing well and work is going well.  Overall doing well and feels safe.     Sexual health & secondary prevention  Sex with men. Monogamous with single partner. She does disclose status. Never uses condoms, we have discussed U=U. Has BTL.    Lab Results   Component Value Date    RPR Non Reactive 12/15/2022    LABRPR Nonreactive 06/12/2020 CTNAA Negative 12/15/2022    CTNAA Negative 05/24/2022    CTNAA Negative 05/24/2022    GCNAA Negative 12/15/2022    GCNAA Negative 05/24/2022    GCNAA Negative 05/24/2022    SPECTYPE Urine 10/28/2020    SPECTYPE Swab 10/28/2020    SPECTYPE Swab 10/28/2020    SPECSOURCE Urine 10/28/2020    SPECSOURCE Throat 10/28/2020    SPECSOURCE Rectum 10/28/2020     GC/CT NAATs --  Negative 12/2022    RPR -- NR 12/2022 - repeat 1Y      Health maintenance  Lab Results   Component Value Date    CREATININE 0.91 12/15/2022    QFTTBGOLD NEGATIVE 02/13/2014    HEPCAB Nonreactive 10/28/2020    CHOL 147 12/15/2022    HDL 39 (L) 12/15/2022    LDL 86 12/15/2022    NONHDL 108 12/15/2022    TRIG 119 12/15/2022    A1C 5.5 12/15/2022    PAP Negative for intraephithelial lesion or malignancy 03/29/2017    FINALDX  03/29/2019     A: Placenta, delivery    - Preterm (36 weeks 1 days EGA) singleton third trimester placenta, weight 320 g (weight 10th percentile for EGA)   - No acute chorioamnionitis identified   -  Three vessel umbilical cord with narrow diameter (0.9 cm) and a furcate insertion  - Placental plate: accelerated maturation of chorionic villi,  increased intervillous and subchorionic fibrin, focal fetal thrombotic vasculopathy, small placental infarction (1.2 cm)          This electronic signature is attestation that the pathologist personally reviewed the submitted material(s) and the final diagnosis reflects that evaluation.       Health maintenance  - chronic, stable    Oral health  She does  have a dentist. Last dental exam within past year.    Eye health  She does  use corrective lenses. Last eye exam within past year.    Metabolic conditions  Wt Readings from Last 5 Encounters:   08/17/22 78.3 kg (172 lb 9.6 oz)   06/07/22 77.5 kg (170 lb 12.8 oz)   04/28/22 76.7 kg (169 lb)   03/09/22 76.2 kg (168 lb)   08/18/21 72.7 kg (160 lb 4.4 oz)     Lab Results   Component Value Date    CREATININE 0.91 12/15/2022    PROTEINUA Negative 02/19/2020    PROTEINUR <4.0 03/29/2019    GLUCOSEU Negative 02/19/2020    PCRATIOUR  03/29/2019      Comment:      Unable to calculate due to value below lower limit of assay linearity.    GLU 79 08/18/2021    A1C 5.5 12/15/2022    ALT 11 12/15/2022    ALT 19 12/14/2021    ALT <7 (L) 08/18/2021     # Kidney health - urine studies needed but deferred to future visit  # Bone health - assessment not yet needed (under age 81)  # Diabetes assessment -  Recent A1c WNL (12/2022)  # NAFLD assessment - monitor over time    Communicable diseases  Lab Results   Component Value Date    QFTTBGOLD NEGATIVE 02/13/2014    HEPAIGG Nonreactive 02/13/2014    HEPBSAB Reactive 02/13/2014    HEPCAB Nonreactive 10/28/2020    RUBIG Negative 05/30/2018    VZVIGG Positive 05/30/2018     # TB screening - no longer needed; negative IGRA, low risk  # Hepatitis screening - repeat HCV screen periodically  # MMR screening -  Rubella nonimmune 2019 - got MMR x 1 2020, needs recheck    Cancer screening  Lab Results   Component Value Date    PAP Negative for intraephithelial lesion or malignancy 03/29/2017    FINALDX  03/29/2019     A: Placenta, delivery    - Preterm (36 weeks 1 days EGA) singleton third trimester placenta, weight 320 g (weight 10th percentile for EGA)   - No acute chorioamnionitis identified   - Three vessel umbilical cord with narrow diameter (0.9 cm) and a furcate insertion  - Placental plate: accelerated maturation of chorionic villi,  increased intervillous and subchorionic fibrin, focal fetal thrombotic vasculopathy, small placental infarction (1.2 cm)          This electronic signature is attestation that the pathologist personally reviewed the submitted material(s) and the final diagnosis reflects that evaluation.       # Cervical - repeat Pap 12 months from prior - NILM with no HPV performed 12/2022  # Breast - no indication for screening at present    # Anorectal - not yet done  # Colorectal - screening not indicated  # Liver - no screening indicated  # Lung - screening not indicated    Cardiovascular disease  Lab Results   Component Value Date    CHOL 147 12/15/2022    HDL 39 (L) 12/15/2022    LDL 86 12/15/2022    NONHDL 108 12/15/2022    TRIG 119 12/15/2022     # The ASCVD Risk score (Arnett DK, et al., 2019) failed to calculate.  - is not taking aspirin   - is not taking statin  - BP control excellent  - former smoker  # AAA screening - no indication for screening    Immunization History   Administered Date(s) Administered    COVID-19 VACC,MRNA,(PFIZER)(PF) 10/28/2020    Influenza Vaccine Quad(IM)6 MO-Adult(PF) 06/30/2015, 06/15/2016, 07/04/2018, 10/28/2020, 08/18/2021    MMR 03/31/2019    PNEUMOCOCCAL POLYSACCHARIDE 23-VALENT 03/31/2019    TdaP 02/05/2019     Immunizations today -  Discussed need for bivalent COVID booster and patient declines recommended immunization(s). Advised to get COVID, flu, PCV20. She does not want to get shots today.     I personally spent 33 minutes face-to-face and non-face-to-face in the care of this patient, which includes all pre, intra, and post visit time on the date of service.  All documented time was specific to the E/M visit and does not include any procedures that may have been performed.    Disposition  Return to clinic 5-6 months or sooner if needed.    [name], MD, MPH   [facility] at [facility]  [address]   [place], [place] [address]  Phone: [phone]   Fax: [phone]    To do on RTC:  MMR   PCV-20  Linkage to MH?     Subjective:      Chief Complaint   HIV followup    HPI  Return patient visit for Morgan Hebert, a 37 y.o. woman with well-controlled HIV, pseudotumor cerebri with associated headaches.  Things are going well with husband Morgan Hebert. Youngest daughter Morgan Hebert, born 03/2019) is doing great and loves school. Older daughter Morgan Hebert) is as well, has custody of niece Morgan Hebert) which has been rocky (older sister of niece who died, mom just released from jail) but this is going much more smoothly. Older girls are in 10th and 11th grades, both want to go into the [employer]. Morgan Hebert is interested in studying psychology.  Excited to go to [place] with friends for the 4/20 festival.  No more cigarettes, only marijuana  Only one COVID vaccine so far.     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; reduced carrier risk [x]  Hemoglobinopathy screening - normal adult hemoglobin  WGS study eligible: no  GENETIC COUNSELING PREVISIT SUMMARY Referring provider: [facility]  Indication: Aneuploidy screening Oth    Environmental allergies     Gastroesophageal reflux disease 06/07/2022    HIV (human immunodeficiency virus infection) (CMS-HCC)     HIV (human immunodeficiency virus infection) (CMS-HCC)     IIH (idiopathic intracranial hypertension)     IUD (intrauterine device) in place     Lymphadenopathy 02/11/2014    Migraine     daily; uses marijuana and tylenol for management; pseudotumor cerebri  Obesity     Pseudotumor cerebri     Rubella non-immune status, antepartum 01/14/2019    [ ]  PP MMR    STD (sexually transmitted disease)     Supervision of high risk pregnancy, unspecified, unspecified trimester 05/30/2018    Dating: 9/21 (Unsure of LMP as had a D&C for a missed AB in 06/2018 and discovered she was pregnant in 07/2018)                                    Prenatal Screening:  [x]  Prenatal labs reviewed [x]  SMA 2 copies/Hgb electropheresis wnl/CF [ ]   [x]  1 hr GTT= 250 but done 3 days after BMZ (4/22 and 4/23) --> Repeat 1 hr GTT  5-18= 229  [ ]  28 wk CBC/RPR [ ]  GBS   Fetal Screening:  [x]  aneuploidy scre    Threatened premature labor in third trimester 01/31/2019    Received BMZ 4/22 ([facility])-4/23 [facility]) for concern for PTL. SVE cl/th/hi.    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 See bottom of this visit's encounter summary for details.  Current Outpatient Medications on File Prior to Visit   Medication Sig    acetaminophen (TYLENOL) 325 MG tablet Take 2 tablets (650 mg total) by mouth Every six (6) hours.    bictegrav-emtricit-tenofov ala (BIKTARVY) 50-200-25 mg tablet Take 1 tablet by mouth daily.    ibuprofen (MOTRIN) 600 MG tablet Take 1 tablet (600 mg total) by mouth Every six (6) hours.    omeprazole (PRILOSEC) 20 MG capsule Take 1 capsule (20 mg total) by mouth daily.    tizanidine (ZANAFLEX) 2 MG tablet Take 1 tablet (2 mg total) by mouth nightly.    topiramate (TOPAMAX) 50 MG tablet Take 2 tablets (100 mg total) by mouth nightly.     No current facility-administered medications on file prior to visit.     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     Social History  General - lives in [place] with her husband and her 2 daughters (born 2008, 2020). Also has custody of niece (2007).   Doreene Adas and his girlfriend are there sometimes as well.   2021 - Family trauma, her 12yo niece committed suicide and her aunt passed away from cancer.   Mom and sister are both positive (sister is perinatally infected, patient with presumed sexual transmission from female partner).  Works at home for [employer], dreams of becoming a Clinical research associate or a Therapist, music. Loves vampire shows like True Blood.   Sexual History - sex with men (husband only)  Substance Use - marijuana (smokes nightly), smoked maybe 3 tobacco cigarettes a day but quit in 2022.  Social History     Tobacco Use    Smoking status: Former     Current packs/day: 0.50     Average packs/day: 0.5 packs/day for 5.0 years (2.5 ttl pk-yrs)     Types: Cigarettes    Smokeless tobacco: Never   Substance Use Topics    Alcohol use: Yes     Alcohol/week: 4.0 standard drinks of alcohol     Types: 1 Glasses of wine, 1  Shots of liquor, 2 Standard drinks or equivalent per week     Comment: rare     Review of Systems  As per HPI. Remainder of 10 systems reviewed, negative.      Objective:      BP 104/77 (BP Site: L Arm, BP Position: Sitting, BP Cuff Size: Medium)  - Pulse 118  - Temp 37.2 ??C (98.9 ??F) (Temporal)  - Ht 149.9 cm (4' 11)  - Wt 74.6 kg (164 lb 6.4 oz)  - LMP 01/04/2023 (Exact Date)  - BMI 33.20 kg/m??   Wt Readings from Last 3 Encounters:   08/17/22 78.3 kg (172 lb 9.6 oz)   06/07/22 77.5 kg (170 lb 12.8 oz)   04/28/22 76.7 kg (169 lb)     Const looks well and attentive, alert, appropriate   Eyes sclerae anicteric, noninjected OU   ENT no thrush, leukoplakia or oral lesions   Lymph no cervical or supraclavicular LAD   CV RRR. No murmurs. No rub or gallop. S1/S2.   Lungs CTAB ant/post, normal work of breathing   GI Soft, no organomegaly. NTND. NABS.   GU deferred   Rectal deferred   Skin no petechiae, ecchymoses or rashes on full inspection.    MSK no joint tenderness and normal ROM throughout. No LE edema, feet warm with brisk DP/PT pulses.   Neuro CN II-XII grossly intact, MAEE, non focal   Psych Appropriate affect. Eye contact good. Linear thoughts. Fluent speech.     Laboratory Data  Reviewed in Epic today, using Synopsis and Chart Review filters.    Lab Results   Component Value Date    CREATININE 0.91 12/15/2022    QFTTBGOLD NEGATIVE 02/13/2014    HEPCAB Nonreactive 10/28/2020    CHOL 147 12/15/2022    HDL 39 (L) 12/15/2022    LDL 86 12/15/2022    NONHDL 108 12/15/2022    TRIG 119 12/15/2022    A1C 5.5 12/15/2022    PAP Negative for intraephithelial lesion or malignancy 03/29/2017    FINALDX  03/29/2019     A: Placenta, delivery    - Preterm (36 weeks 1 days EGA) singleton third trimester placenta, weight 320 g (weight 10th percentile for EGA)   - No acute chorioamnionitis identified   - Three vessel umbilical cord with narrow diameter (0.9 cm) and a furcate insertion  - Placental plate: accelerated maturation of chorionic villi,  increased intervillous and subchorionic fibrin, focal fetal thrombotic vasculopathy, small placental infarction (1.2 cm)          This electronic signature is attestation that the pathologist personally reviewed the submitted material(s) and the final diagnosis reflects that evaluation.              _____________________________________________________________________

## 2023-01-19 MED ORDER — PROCHLORPERAZINE MALEATE 10 MG TABLET
ORAL_TABLET | Freq: Two times a day (BID) | ORAL | 1 refills | 15 days | Status: CP | PRN
Start: 2023-01-19 — End: 2023-02-18
  Filled 2023-01-23: qty 30, 15d supply, fill #0

## 2023-01-19 NOTE — Unmapped (Signed)
Refill request received from patient.      Medication Requested: Prochlorperazine 10 mg   Last Office Visit: 08/11/2022  Next Office Visit: 02/22/2023  Last Prescriber: [name]    Nurse refill requirements met? No  If not met, why: Medication not on active SOP    Sent to: Provider for signing  If sent to provider, which provider?: [name]

## 2023-01-23 MED FILL — TIZANIDINE 2 MG TABLET: ORAL | 30 days supply | Qty: 30 | Fill #3

## 2023-02-22 ENCOUNTER — Ambulatory Visit: Admit: 2023-02-22 | Discharge: 2023-02-23 | Payer: BLUE CROSS/BLUE SHIELD

## 2023-02-22 LAB — COMPREHENSIVE METABOLIC PANEL
ALBUMIN: 4.1 g/dL (ref 3.4–5.0)
ALKALINE PHOSPHATASE: 72 U/L (ref 46–116)
ALT (SGPT): <7 U/L — ABNORMAL LOW (ref 10–49)
ANION GAP: 5 mmol/L (ref 5–14)
AST (SGOT): 17 U/L (ref <=34)
BILIRUBIN TOTAL: 0.2 mg/dL — ABNORMAL LOW (ref 0.3–1.2)
BLOOD UREA NITROGEN: 9 mg/dL (ref 9–23)
BUN / CREAT RATIO: 11
CALCIUM: 9.2 mg/dL (ref 8.7–10.4)
CHLORIDE: 112 mmol/L — ABNORMAL HIGH (ref 98–107)
CO2: 24 mmol/L (ref 20.0–31.0)
CREATININE: 0.84 mg/dL
EGFR CKD-EPI (2021) FEMALE: >90 mL/min/{1.73_m2} (ref >=60)
GLUCOSE RANDOM: 101 mg/dL (ref 70–179)
POTASSIUM: 3.4 mmol/L (ref 3.4–4.8)
PROTEIN TOTAL: 7.5 g/dL (ref 5.7–8.2)
SODIUM: 141 mmol/L (ref 135–145)

## 2023-02-22 MED ORDER — TIZANIDINE 2 MG TABLET
ORAL_TABLET | Freq: Every evening | ORAL | 5 refills | 30 days | Status: CP
Start: 2023-02-22 — End: 2023-08-21
  Filled 2023-03-10: qty 60, 30d supply, fill #0

## 2023-02-22 NOTE — Unmapped (Addendum)
-   please have an EKG done at your next ID appointment in September  - increase your Tizanidine to 4 mg nightly (new prescription sent)   - please have your blood drawn before leaving clinic today to check your liver function while taking the Tizanidine  - we will see you again in 6 months

## 2023-02-22 NOTE — Unmapped (Addendum)
Neurology Follow-up Visit Note     [facility]  [address]  [address]  [place] [address]    Date: 02/22/2023   Patient Name: Morgan Hebert   MRN: [id number]   PCP: Artelia Laroche  Referring Provider: Artelia Laroche, MD       Assessment and Plan          Morgan Hebert is a 37 y.o. female presenting for follow-up of multifactorial headache.     Headache  Patient carries diagnoses of IIH and migraine headache and has features consistent with chronic tension type headache, as well. OTC medication overuse and marijuana use also potentially contributory. She has intermittently responded to Diamox in the past but recently discontinued due to poor efficacy. She denies vision loss or changes but is due for a formal ophthalmology evaluation. Topamax was started to target both IIH and migraine and patient is tolerating this well but continues to have daily tension type headaches, for which Tizanidine was added in 08/2022 and increased today. If this is not effective for chronic daily headache, will consider use of CGRP inhibitor. TCA's contraindicated due to interactions with Biktarvy; BB contraindicated due to low BP.     - increase Tizanidine to 4 mg nightly  - will get CMP today and EKG in September  - advised formal ophtho exam    Return Visit in: 6 months    I personally spent 45 minutes face-to-face and non-face-to-face in the care of this patient, which includes all pre, intra, and post visit time on the date of service.      Patient was discussed with Dr.Traub who agrees with assessment and plan.    Alwyn Pea, MD   Neurology, PGY-3    ATTESTATION NOTE:  I discussed the patient's case with the resident. I reviewed the resident???s note and agree with the resident???s findings and plan as documented in their note.      Jamelle Rushing, MD  Associate Professor  Department of Neurology  [facility]         HPI HPI: Morgan Hebert is a 37 y.o. female who presents for follow up to the [facility] for follow-up of headache. Patient was last seen by [facility] on 08/11/22.      To review their relevant history:    IIH was diagnosed in 2019 after 1 year of HA that progressed to include visual changes. Opening pressures on LP have been as high as 40. She has not required LP since 2020 and was initially maintained on Diamox only however she continued to have some headaches despite Diamox dosing as high as 1500 mg BID (this dose was increased from 1000 BID in the setting of pregnancy in 2020). She presented to neurology in November 2022 with worsened HA and 1 month of persistent AM N/V in the setting of being off Diamox therapy for 2 months. Diamox was restarted at 1g BID. She saw neuro-ophthalmology shortly after neurology in 08/2021, who noted a stable eye exam. As of 04/28/22, patient reported no reduction in HA since restarting her Diamox. She continued to have daily headaches and morning nausea. Given headaches did not respond at all to restarting Diamox, we considered other headache types including migraine (photophobia is also sometimes present without vision blurring), tension type HA, and medication overuse HA. We started Topamax, to target IIH and migraine. This  has not reduced daily HA however she has had reduction in daily nausea. She is not requiring Imitrex. Given persistent HA that has been nonresponsive to multiple medications, we checked MRI brain and MRV head to rule out vascular causes of HA. There was no evidence of CVST or other intracranial abnormality that could explain her headaches. She does have some mild L > R frontal T2 abnormalities that can likely be attributed to her migraine history (vs small vessel ischemic disease although this is less likely given age). Started Tizanidine in November 2023.    Interval History:  - Still having daily headaches but less severe with 2 mg nighttime Tizanidine  - No AM grogginess with Tizanidine   - Using Compazine 1-2x per week  - No changes to vision  - Losing weight        Allergies   Allergen Reactions    Peanut Other (See Comments)     Patient allergic to walnuts, cashews, pistachios and peanuts in excess.        Current Outpatient Medications   Medication Sig Dispense Refill    acetaminophen (TYLENOL) 325 MG tablet Take 2 tablets (650 mg total) by mouth Every six (6) hours. 120 tablet 0    bictegrav-emtricit-tenofov ala (BIKTARVY) 50-200-25 mg tablet Take 1 tablet by mouth daily. 90 tablet 3    ibuprofen (MOTRIN) 600 MG tablet Take 1 tablet (600 mg total) by mouth Every six (6) hours. 60 tablet 0    omeprazole (PRILOSEC) 20 MG capsule Take 1 capsule (20 mg total) by mouth daily. 90 capsule 3    prochlorperazine (COMPAZINE) 10 MG tablet Take 1 tablet (10 mg total) by mouth two (2) times a day as needed for nausea. 30 tablet 1    topiramate (TOPAMAX) 50 MG tablet Take 2 tablets (100 mg total) by mouth nightly. 180 tablet 1    tizanidine (ZANAFLEX) 2 MG tablet Take 2 tablets (4 mg total) by mouth nightly. 60 tablet 5     No current facility-administered medications for this visit.       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; reduced carrier risk [x]  Hemoglobinopathy screening - normal adult hemoglobin  WGS study eligible: no  GENETIC COUNSELING PREVISIT SUMMARY Referring provider: [facility]  Indication: Aneuploidy screening Oth    Environmental allergies     Gastroesophageal reflux disease 06/07/2022    HIV (human immunodeficiency virus infection) (CMS-HCC)     HIV (human immunodeficiency virus infection) (CMS-HCC)     IIH (idiopathic intracranial hypertension)     IUD (intrauterine device) in place     Lymphadenopathy 02/11/2014    Migraine     daily; uses marijuana and tylenol for management; pseudotumor cerebri    Obesity     Pseudotumor cerebri     Rubella non-immune status, antepartum 01/14/2019    [ ]  PP MMR    STD (sexually transmitted disease)     Supervision of high risk pregnancy, unspecified, unspecified trimester 05/30/2018    Dating: 9/21 (Unsure of LMP as had a D&C for a missed AB in 06/2018 and discovered she was  pregnant in 07/2018)                                    Prenatal Screening:  [x]  Prenatal labs reviewed [x]  SMA 2 copies/Hgb electropheresis wnl/CF [ ]   [x]  1 hr GTT= 250 but done 3 days after BMZ (4/22 and 4/23) --> Repeat 1 hr GTT  5-18= 229  [ ]  28 wk CBC/RPR [ ]  GBS   Fetal Screening:  [x]  aneuploidy scre    Threatened premature labor in third trimester 01/31/2019    Received BMZ 4/22 ([place])-4/23 [facility]) for concern for PTL. SVE cl/th/hi.    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, symptomatic sometimes    Tooth sensitivity     ULQ to sugar       Past Surgical History:   Procedure Laterality Date    DILATION AND CURETTAGE OF UTERUS      LUMBAR PUNCTURE      PR CESAREAN DELIVERY ONLY N/A 03/29/2019    Procedure: CESAREAN DELIVERY ONLY;  Surgeon: Asher Muir, MD;  Location: L&D C-SECTION OR SUITES [facility];  Service: Maternal-Fetal Medicine    PR COLONOSCOPY W/BIOPSY SINGLE/MULTIPLE  07/03/2014    Procedure: COLONOSCOPY, FLEXIBLE, PROXIMAL TO SPLENIC FLEXURE; WITH BIOPSY, SINGLE OR MULTIPLE;  Surgeon: Billie Ruddy, MD;  Location: GI PROCEDURES [facility];  Service: Gastroenterology    PR DILATION/CURETTAGE,DIAGNOSTIC N/A 06/14/2018    Procedure: DILATION AND CURETTAGE, DIAGNOSTIC AND/OR THERAPEUTIC (NON OBSTETRICAL);  Surgeon: Nelle Don, MD;  [facility];  Service: [facility] Gynecology    PR LAP,RMV  ADNEXAL STRUCTURE Bilateral 06/04/2019    Procedure: R21  LAPAROSCOPY, SURGICAL; W/REMOVAL OF ADNEXAL STRUCTURES (PARTIAL OR TOTAL OOPHORECTOMY &/OR SALPINGECTOMY);  Surgeon: Joneen Caraway, MD;  Location: [facility] OR [facility];  Service: Family Planning    PR SIGMOIDOSCOPY FLX DX W/COLLJ SPEC BR/WA IF PFRMD N/A 06/25/2020    Procedure: SIGMOIDOSCOPY, FLEXIBLE; DIAGNOSTIC, WITH OR WITHOUT COLLECTION OF SPECIMEN(S) BY BRUSHING OR WASHING;  Surgeon: Bronson Curb, MD;  Location: [facility] PROCEDURES [facility];  Service: Gastroenterology    PR UPPER GI ENDOSCOPY,BIOPSY N/A 07/03/2014    Procedure: UGI ENDOSCOPY; WITH BIOPSY, SINGLE OR MULTIPLE;  Surgeon: Billie Ruddy, MD;  Location: [facility] PROCEDURES [facility] [facility];  Service: Gastroenterology    WISDOM TOOTH EXTRACTION         Social History     Socioeconomic History    Marital status: Married     Spouse name: Loraine Leriche     Number of children: 2    Years of education: None    Highest education level: None   Tobacco Use    Smoking status: Former     Current packs/day: 0.50     Average packs/day: 0.5 packs/day for 5.0 years (2.5 ttl pk-yrs)     Types: Cigarettes    Smokeless tobacco: Never   Vaping Use    Vaping status: Never Used   Substance and Sexual Activity    Alcohol use: Yes     Alcohol/week: 4.0 standard drinks of alcohol     Types: 1 Glasses of wine, 1 Shots of liquor, 2 Standard drinks or equivalent per week     Comment: rare    Drug use: Yes     Frequency: 35.0 times per week     Types: Marijuana     Comment: depends on the week  for migraines    Sexual activity: Yes     Partners: Male   Social History Narrative    Patient lives in [place], [place] with her Husband and two daughters..  She is not employed.     Social Determinants of Health     Financial Resource Strain: Low Risk  (02/07/2019)    Received from [facility]    Overall Financial Resource Strain (CARDIA)     Difficulty of Paying Living Expenses: Not hard at all   Food Insecurity: No Food Insecurity (02/07/2019)    Received from [facility]    Hunger Vital Sign     Worried About Running Out of Food in the Last Year: Never true     Ran Out of Food in the Last Year: Never true   Transportation Needs: No Transportation Needs (02/07/2019)    Received from [facility] - Transportation     Lack of Transportation (Medical): No     Lack of Transportation (Non-Medical): No   Physical Activity: Inactive (02/07/2019)    Received from [facility]    Exercise Vital Sign     Days of Exercise per Week: 0 days     Minutes of Exercise per Session: 0 min   Stress: No Stress Concern Present (02/07/2019)    Received from [facility] - Occupational Stress Questionnaire     Feeling of Stress : Not at all   Social Connections: Unknown (02/07/2019)    Received from [facility]    Social Connection and Isolation Panel [NHANES]     Frequency of Communication with Friends and Family: Patient declined     Frequency of Social Gatherings with Friends and Family: Patient declined     Attends Religious Services: Patient declined     Database administrator or Organizations: Patient declined     Attends Engineer, structural: Patient declined     Marital Status: Patient declined       Family History   Problem Relation Age of Onset    Cervical cancer Mother     Diabetes Mother     Asthma Mother     Hypertension Mother     Diabetes Maternal Aunt     Hypertension Maternal Aunt     Breast cancer Maternal Grandmother     Cancer Maternal Grandmother     No Known Problems Daughter     Glaucoma Neg Hx             Objective        Vital signs: BP 105/68 (BP Site: L Arm, BP Position: Sitting, BP Cuff Size: Medium)  - Pulse 101  - Resp 18  - Ht 149.9 cm (4' 11)  - Wt 73.9 kg (163 lb)  - LMP 01/30/2023  - BMI 32.92 kg/m??        Physical Exam:  General Appearance: Well appearing. In no acute distress.  HEENT: Head is atraumatic and normocephalic. Sclera anicteric without injection. Oropharyngeal membranes are moist with no erythema or exudate.  Neck: Supple.  Lungs:  normal WOB on RA  Heart: Radial pulses 2+ bilaterally.  Abdomen: Nondistended.  Extremities: No clubbing, cyanosis, or edema.     Neurological Examination:      Mental Status: Alert, conversant, able to follow conversation and interview. Spontaneous speech was fluent without word finding pauses, dysarthria, or paraphasic errors. Comprehension was intact to simple and multi-step commands. Memory for recent  and remote events was intact. Affect was full range and appropriate to context.     Cranial Nerves: Visual fields intact to direct confrontation. PERRL 4 mm. Pursuit eye movements were uninterrupted with full range and without more than end-gaze nystagmus. Facial sensation intact bilaterally to light touch on the forehead, cheek, and chin. Face symmetric at rest. Normal facial movement bilaterally, including forehead, eye closure and grimace/smile. Hearing intact to conversation. Shoulder shrug full strength bilaterally.     Motor Exam: Normal bulk.  Normal tone in the upper and lower extremities.  No tremors, myoclonus, or other adventitious movement.  Pronator drift is absent.  UE R/L: deltoid 5/5, biceps 5/5, triceps 5/5, wrist flexion 5/5, wrist extension 5/5, finger spread 5/5 and hand grip strong/strong.  LE R/L: hip flexion 5/5, hip extension 5/5, quadriceps 5/5, hamstrings 5/5, dorsiflexion 5/5 and plantar flexion 5/5.  Fine finger movements and foot taps had normal velocity and amplitude with no breakdown.        Reflexes:    R L   Biceps +2 +2   Brachioradialis +2 +2   Triceps +2 +2   Patella +2 +2   Achilles +2 +2         Sensory: Sensation normal to light touch and temperature sensation to cold in both hands and both feet and to position sense and vibration distally in the fingers and toes. Romberg sign negative.     Cerebellar/Coordination/Gait: Rapid alternating movements are normal in bilateral upper extremities. Finger-to-nose is normal without ataxia or dysmetria bilaterally. Heel-to-shin is normal without ataxia or dysmetria bilaterally. Gait exam demonstrates normal posture, base, stride length, arm swing and turns.     Diagnostic Studies     Last Chem:   Lab Results   Component Value Date    NA 141 02/22/2023    K 3.4 02/22/2023    BUN 9 02/22/2023    CREATININE 0.84 02/22/2023    GLU 101 02/22/2023     Last LFTs:   Lab Results   Component Value Date    AST 17 02/22/2023    ALT <7 (L) 02/22/2023    ALKPHOS 72 02/22/2023    GGT 14 12/15/2022    BILITOT 0.2 (L) 02/22/2023    PROT 7.5 02/22/2023    ALBUMIN 4.1 02/22/2023

## 2023-03-08 MED ORDER — TOPIRAMATE 50 MG TABLET
ORAL_TABLET | Freq: Every evening | ORAL | 1 refills | 90 days
Start: 2023-03-08 — End: 2023-09-04

## 2023-03-08 NOTE — Unmapped (Signed)
[facility] Refill Coordination Note    Specialty Medication(s) to be Shipped:   Infectious Disease: Biktarvy    Other medication(s) to be shipped:  Tizanidine, Prochlorperazine, Topiramate     Morgan Hebert, DOB: [birth year]  Phone: [phone] (home) [phone] (work)      All above HIPAA information was verified with patient.     Was a Nurse, learning disability used for this call? No    Completed refill call assessment today to schedule patient's medication shipment from the [facility] [phone]).  All relevant notes have been reviewed.     Specialty medication(s) and dose(s) confirmed: Regimen is correct and unchanged.   Changes to medications: Morgan Hebert reports no changes at this time.  Changes to insurance: No  New side effects reported not previously addressed with a pharmacist or physician: None reported  Questions for the pharmacist: No    Confirmed patient received a Conservation officer, historic buildings and a Surveyor, mining with first shipment. The patient will receive a drug information handout for each medication shipped and additional FDA Medication Guides as required.       DISEASE/MEDICATION-SPECIFIC INFORMATION        N/A    SPECIALTY MEDICATION ADHERENCE     Medication Adherence    Patient reported X missed doses in the last month: 0  Specialty Medication: BIKTARVY 50-200-25 mg tablet  Patient is on additional specialty medications: No              Were doses missed due to medication being on hold? No    Biktarvy 50-200-25 mg: 4 days of medicine on hand        REFERRAL TO PHARMACIST     Referral to the pharmacist: Not needed      [facility]     Shipping address confirmed in Epic.       Delivery Scheduled: Yes, Expected medication delivery date: 03/10/23.     Medication will be delivered via Same Day Courier to the prescription address in Epic WAM.    Morgan Hebert   [facility] Specialty Technician

## 2023-03-08 NOTE — Unmapped (Signed)
Refill request received from patient.      Medication Requested: topiramate 50 mg   Last Office Visit: 02/22/2023   Next Office Visit: Visit date not found  Last Prescriber: [name]     Nurse refill requirements met? No  If not met, why: Allergy Contradiction//complimentary therapy not file     Sent to: Provider for signing  If sent to provider, which provider?: [name]

## 2023-03-10 MED ORDER — TOPIRAMATE 50 MG TABLET
ORAL_TABLET | Freq: Every evening | ORAL | 1 refills | 90 days | Status: CP
Start: 2023-03-10 — End: 2023-09-06
  Filled 2023-03-10: qty 180, 90d supply, fill #0

## 2023-03-10 MED FILL — PROCHLORPERAZINE MALEATE 10 MG TABLET: ORAL | 15 days supply | Qty: 30 | Fill #1

## 2023-03-10 NOTE — Unmapped (Signed)
Attempted to contact patient to renew RW services, but no answer. Left voice message asking for a call back.      Sonia Torres,  Benefits & Eligibility Coordinator  Time of Intervention: 2 minutes

## 2023-05-05 MED FILL — TIZANIDINE 2 MG TABLET: ORAL | 30 days supply | Qty: 60 | Fill #1

## 2023-05-29 NOTE — Unmapped (Signed)
[facility] Refill Coordination Note    Specialty Medication(s) to be Shipped:   Infectious Disease: Biktarvy    Other medication(s) to be shipped:  omeprazole, tizandine, topiramate     Morgan Hebert, DOB: 27-Aug-1986  Phone: [phone] (home) [phone] (work)      All above HIPAA information was verified with patient.     Was a Nurse, learning disability used for this call? No    Completed refill call assessment today to schedule patient's medication shipment from the [facility] [phone]).  All relevant notes have been reviewed.     Specialty medication(s) and dose(s) confirmed: Regimen is correct and unchanged.   Changes to medications: Jadamarie reports no changes at this time.  Changes to insurance: No  New side effects reported not previously addressed with a pharmacist or physician: None reported  Questions for the pharmacist: No    Confirmed patient received a Conservation officer, historic buildings and a Surveyor, mining with first shipment. The patient will receive a drug information handout for each medication shipped and additional FDA Medication Guides as required.       DISEASE/MEDICATION-SPECIFIC INFORMATION        N/A    SPECIALTY MEDICATION ADHERENCE     Medication Adherence    Patient reported X missed doses in the last month: 0  Specialty Medication: BIKTARVY 50-200-25 mg tablet (bictegrav-emtricit-tenofov ala)  Patient is on additional specialty medications: No  Patient is on more than two specialty medications: No  Any gaps in refill history greater than 2 weeks in the last 3 months: no  Demonstrates understanding of importance of adherence: yes  Informant: patient  Confirmed plan for next specialty medication refill: delivery by pharmacy  Refills needed for supportive medications: not needed          Refill Coordination    Has the Patients' Contact Information Changed: No  Is the Shipping Address Different: No         Were doses missed due to medication being on hold? No    BIKTARVY 50-200-25   mg: 10 doses of medicine on hand       REFERRAL TO PHARMACIST     Referral to the pharmacist: Not needed      SHIPPING     Shipping address confirmed in Epic.       Delivery Scheduled: Yes, Expected medication delivery date: 06/01/2023.     Medication will be delivered via Next Day Courier to the prescription address in Epic WAM.    Kerby Less   [facility] Specialty Technician

## 2023-05-31 MED FILL — OMEPRAZOLE 20 MG CAPSULE,DELAYED RELEASE: ORAL | 90 days supply | Qty: 90 | Fill #3

## 2023-05-31 MED FILL — BIKTARVY 50 MG-200 MG-25 MG TABLET: ORAL | 90 days supply | Qty: 90 | Fill #1

## 2023-05-31 MED FILL — TOPIRAMATE 50 MG TABLET: ORAL | 90 days supply | Qty: 180 | Fill #1

## 2023-05-31 MED FILL — TIZANIDINE 2 MG TABLET: ORAL | 30 days supply | Qty: 60 | Fill #2

## 2023-06-20 NOTE — Unmapped (Unsigned)
r     [facility]  [address]  [place], [place]  [address]  P [phone]  F [phone]     Primary care provider: Artelia Laroche, MD    Assessment/Plan:      Morgan Hebert, a 37 y.o. female seen today for routine HIV followup.    Plan:  HIV  Fills ART via private insurance. RW renewal UTD (caps on charges)  Continue current therapy. E-prescribed today. She uses [facility].  Labs drawn through study 12/2022  Encouraged continued excellent ARV adherence.   Lab Results   Component Value Date    ACD4 1,118 10/22/2018    CD4 54 10/22/2018    HIVCP <20 06/24/2020    HIVRS Not Detected 08/18/2021   12/15/22  CBC WNL  CMP WNL  Lipids HDL 39, LDL <90  A1C 5.5  RPR Neg  GC/CHL Neg (cervicovaginal)  CD4 1632 (54%)  HIV RNA <20  Pap NILM no HPV performed     Pseudotumor cerebri, chronic headaches  Sees  [facility], appt with [name]   MRI brain and MRV head completed 07/05/22, symptoms are stable  We have previously discussed medication use and possible role of marijuana in exacerbating her headaches and nausea (the latter is under better control lately).   For ophtho followup     COVID-19  Discussed again today. Counseled re the CDC's current recommendations (as of Sept 2023) to access updated Pfizer-BioNTech or Moderna COVID-19 vaccine to protect against sequelae of COVID-19, including hospitalization and death.   She received one dose in 10/2020. She does not think she is at risk and prefers to defer.  I reiterated my strong recommendation that she access this.     Mental Health  Symptoms are stable, exacerbated by stress. Feels need is less acute as kids are doing well and work is going well.  Overall doing well and feels safe.     Sexual health & secondary prevention  Sex with men. Monogamous with single partner. She does disclose status. Never uses condoms, we have discussed U=U. Has BTL.    Lab Results   Component Value Date    RPR Non Reactive 12/15/2022    LABRPR Nonreactive 06/12/2020 CTNAA Negative 12/15/2022    CTNAA Negative 05/24/2022    CTNAA Negative 05/24/2022    GCNAA Negative 12/15/2022    GCNAA Negative 05/24/2022    GCNAA Negative 05/24/2022    SPECTYPE Urine 10/28/2020    SPECTYPE Swab 10/28/2020    SPECTYPE Swab 10/28/2020    SPECSOURCE Urine 10/28/2020    SPECSOURCE Throat 10/28/2020    SPECSOURCE Rectum 10/28/2020     GC/CT NAATs --  Negative 12/2022    RPR -- NR 12/2022 - repeat 1Y      Health maintenance  Lab Results   Component Value Date    CREATININE 0.84 02/22/2023    QFTTBGOLD NEGATIVE 02/13/2014    HEPCAB Nonreactive 10/28/2020    CHOL 147 12/15/2022    HDL 39 (L) 12/15/2022    LDL 86 12/15/2022    NONHDL 108 12/15/2022    TRIG 119 12/15/2022    A1C 5.5 12/15/2022    PAP Negative for intraephithelial lesion or malignancy 03/29/2017    FINALDX  03/29/2019     A: Placenta, delivery    - Preterm (36 weeks 1 days EGA) singleton third trimester placenta, weight 320 g (weight 10th percentile for EGA)   - No acute chorioamnionitis identified   -  Three vessel umbilical cord with narrow diameter (0.9 cm) and a furcate insertion  - Placental plate: accelerated maturation of chorionic villi,  increased intervillous and subchorionic fibrin, focal fetal thrombotic vasculopathy, small placental infarction (1.2 cm)          This electronic signature is attestation that the pathologist personally reviewed the submitted material(s) and the final diagnosis reflects that evaluation.       Health maintenance  - chronic, stable    Oral health  She does  have a dentist. Last dental exam within past year.    Eye health  She does  use corrective lenses. Last eye exam within past year.    Metabolic conditions  Wt Readings from Last 5 Encounters:   02/22/23 73.9 kg (163 lb)   01/18/23 74.6 kg (164 lb 6.4 oz)   08/17/22 78.3 kg (172 lb 9.6 oz)   06/07/22 77.5 kg (170 lb 12.8 oz)   04/28/22 76.7 kg (169 lb)     Lab Results   Component Value Date    CREATININE 0.84 02/22/2023    PROTEINUA Negative 02/19/2020    PROTEINUR <4.0 03/29/2019    GLUCOSEU Negative 02/19/2020    PCRATIOUR  03/29/2019      Comment:      Unable to calculate due to value below lower limit of assay linearity.    GLU 101 02/22/2023    A1C 5.5 12/15/2022    ALT <7 (L) 02/22/2023    ALT 11 12/15/2022    ALT 19 12/14/2021     # Kidney health - urine studies needed but deferred to future visit  # Bone health - assessment not yet needed (under age 34)  # Diabetes assessment -  Recent A1c WNL (12/2022)  # NAFLD assessment - monitor over time    Communicable diseases  Lab Results   Component Value Date    QFTTBGOLD NEGATIVE 02/13/2014    HEPAIGG Nonreactive 02/13/2014    HEPBSAB Reactive 02/13/2014    HEPCAB Nonreactive 10/28/2020    RUBIG Negative 05/30/2018    VZVIGG Positive 05/30/2018     # TB screening - no longer needed; negative IGRA, low risk  # Hepatitis screening - repeat HCV screen periodically  # MMR screening -  Rubella nonimmune 2019 - got MMR x 1 2020, needs recheck    Cancer screening  Lab Results   Component Value Date    PAP Negative for intraephithelial lesion or malignancy 03/29/2017    FINALDX  03/29/2019     A: Placenta, delivery    - Preterm (36 weeks 1 days EGA) singleton third trimester placenta, weight 320 g (weight 10th percentile for EGA)   - No acute chorioamnionitis identified   - Three vessel umbilical cord with narrow diameter (0.9 cm) and a furcate insertion  - Placental plate: accelerated maturation of chorionic villi,  increased intervillous and subchorionic fibrin, focal fetal thrombotic vasculopathy, small placental infarction (1.2 cm)          This electronic signature is attestation that the pathologist personally reviewed the submitted material(s) and the final diagnosis reflects that evaluation.       # Cervical - repeat Pap 12 months from prior - NILM with no HPV performed 12/2022  # Breast - no indication for screening at present    # Anorectal - not yet done  # Colorectal - screening not indicated  # Liver - no screening indicated  # Lung - screening not indicated    Cardiovascular disease  Lab Results   Component Value Date    CHOL 147 12/15/2022    HDL 39 (L) 12/15/2022    LDL 86 12/15/2022    NONHDL 108 12/15/2022    TRIG 119 12/15/2022     # The ASCVD Risk score (Arnett DK, et al., 2019) failed to calculate.  - is not taking aspirin   - is not taking statin  - BP control excellent  - former smoker  # AAA screening - no indication for screening    Immunization History   Administered Date(s) Administered    COVID-19 VACC,MRNA,(PFIZER)(PF) 10/28/2020    Influenza Vaccine Quad(IM)6 MO-Adult(PF) 06/30/2015, 06/15/2016, 07/04/2018, 10/28/2020, 08/18/2021    MMR 03/31/2019    PNEUMOCOCCAL POLYSACCHARIDE 23-VALENT 03/31/2019    TdaP 02/05/2019     Immunizations today -  Discussed need for bivalent COVID booster and patient declines recommended immunization(s). Advised to get COVID, flu, PCV20. She does not want to get shots today.     I personally spent 33 minutes face-to-face and non-face-to-face in the care of this patient, which includes all pre, intra, and post visit time on the date of service.  All documented time was specific to the E/M visit and does not include any procedures that may have been performed.    Disposition  Return to clinic 5-6 months or sooner if needed.    [name], MD, MPH   [facility] at [facility]  [address]   [place], [place] [address]  Phone: [phone]   Fax: [phone]    To do on RTC:  MMR   PCV-20  Linkage to MH?     Subjective:      Chief Complaint   HIV followup    HPI  Return patient visit for Morgan Hebert, a 37 y.o. woman with well-controlled HIV, pseudotumor cerebri with associated headaches.    Things are going well with husband Loraine Leriche. Youngest daughter [name], born 03/2019) is doing great and loves school. Older daughter Gordy Savers) is as well, has custody of niece Lucita Lora) which has been rocky (older sister of niece who died, mom just released from jail) but this is going much more smoothly. Older girls are in 10th and 11th grades, both want to go into the [employer]. Lucita Lora is interested in studying psychology.  Excited to go to [place] with friends for the 4/20 festival.  No more cigarettes, only marijuana  Only one COVID vaccine so far.     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; reduced carrier risk [x]  Hemoglobinopathy screening - normal adult hemoglobin  WGS study eligible: no  GENETIC COUNSELING PREVISIT SUMMARY Referring provider: [facility]  Indication: Aneuploidy screening Oth    Environmental allergies     Gastroesophageal reflux disease 06/07/2022    HIV (human immunodeficiency virus infection) (CMS-HCC)     HIV (human immunodeficiency virus infection) (CMS-HCC)     IIH (idiopathic intracranial hypertension)     IUD (intrauterine device) in place     Lymphadenopathy 02/11/2014    Migraine     daily; uses marijuana and tylenol for management; pseudotumor cerebri  Obesity     Pseudotumor cerebri     Rubella non-immune status, antepartum 01/14/2019    [ ]  PP MMR    STD (sexually transmitted disease)     Supervision of high risk pregnancy, unspecified, unspecified trimester 05/30/2018    Dating: 9/21 (Unsure of LMP as had a D&C for a missed AB in 06/2018 and discovered she was pregnant in 07/2018)                                    Prenatal Screening:  [x]  Prenatal labs reviewed [x]  SMA 2 copies/Hgb electropheresis wnl/CF [ ]   [x]  1 hr GTT= 250 but done 3 days after BMZ (4/22 and 4/23) --> Repeat 1 hr GTT  5-18= 229  [ ]  28 wk CBC/RPR [ ]  GBS   Fetal Screening:  [x]  aneuploidy scre    Threatened premature labor in third trimester 01/31/2019    Received BMZ 4/22 ([facility]23 [facility]) for concern for PTL. SVE cl/th/hi.    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 See bottom of this visit's encounter summary for details.  Current Outpatient Medications on File Prior to Visit   Medication Sig    bictegrav-emtricit-tenofov ala (BIKTARVY) 50-200-25 mg tablet Take 1 tablet by mouth daily.    omeprazole (PRILOSEC) 20 MG capsule Take 1 capsule (20 mg total) by mouth daily.    tizanidine (ZANAFLEX) 2 MG tablet Take 2 tablets (4 mg total) by mouth nightly.    topiramate (TOPAMAX) 50 MG tablet Take 2 tablets (100 mg total) by mouth nightly.     No current facility-administered medications on file prior to visit.     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     Social History  General - lives in [place] with her husband and her 2 daughters (born 2008, 2020). Also has custody of niece (2007).   Doreene Adas and his girlfriend are there sometimes as well.   2021 - Family trauma, her 12yo niece committed suicide and her aunt passed away from cancer.   Mom and sister are both positive (sister is perinatally infected, patient with presumed sexual transmission from female partner).  Works at home for [employer], dreams of becoming a Clinical research associate or a Therapist, music. Loves vampire shows like True Blood.   Sexual History - sex with men (husband only)  Substance Use - marijuana (smokes nightly), smoked maybe 3 tobacco cigarettes a day but quit in 2022.  Social History     Tobacco Use    Smoking status: Former     Current packs/day: 0.50     Average packs/day: 0.5 packs/day for 5.0 years (2.5 ttl pk-yrs)     Types: Cigarettes    Smokeless tobacco: Never   Substance Use Topics    Alcohol use: Yes     Alcohol/week: 4.0 standard drinks of alcohol     Types: 1 Glasses of wine, 1 Shots of liquor, 2 Standard drinks or equivalent per week     Comment: rare     Review of Systems  As per HPI. Remainder of 10 systems reviewed, negative.      Objective:  There were no vitals taken for this visit.  Wt Readings from Last 3 Encounters:   02/22/23 73.9 kg (163 lb)   01/18/23 74.6 kg (164 lb 6.4 oz)   08/17/22 78.3 kg (172 lb 9.6 oz)     Const looks well and attentive, alert, appropriate   Eyes sclerae anicteric, noninjected OU   ENT no thrush, leukoplakia or oral lesions   Lymph no cervical or supraclavicular LAD   CV RRR. No murmurs. No rub or gallop. S1/S2.   Lungs CTAB ant/post, normal work of breathing   GI Soft, no organomegaly. NTND. NABS.   GU deferred   Rectal deferred   Skin no petechiae, ecchymoses or rashes on full inspection.    MSK no joint tenderness and normal ROM throughout. No LE edema, feet warm with brisk DP/PT pulses.   Neuro CN II-XII grossly intact, MAEE, non focal   Psych Appropriate affect. Eye contact good. Linear thoughts. Fluent speech.     Laboratory Data  Reviewed in Epic today, using Synopsis and Chart Review filters.    Lab Results   Component Value Date    CREATININE 0.84 02/22/2023    QFTTBGOLD NEGATIVE 02/13/2014    HEPCAB Nonreactive 10/28/2020    CHOL 147 12/15/2022    HDL 39 (L) 12/15/2022    LDL 86 12/15/2022    NONHDL 108 12/15/2022    TRIG 119 12/15/2022    A1C 5.5 12/15/2022    PAP Negative for intraephithelial lesion or malignancy 03/29/2017    FINALDX  03/29/2019     A: Placenta, delivery    - Preterm (36 weeks 1 days EGA) singleton third trimester placenta, weight 320 g (weight 10th percentile for EGA)   - No acute chorioamnionitis identified   - Three vessel umbilical cord with narrow diameter (0.9 cm) and a furcate insertion  - Placental plate: accelerated maturation of chorionic villi,  increased intervillous and subchorionic fibrin, focal fetal thrombotic vasculopathy, small placental infarction (1.2 cm)          This electronic signature is attestation that the pathologist personally reviewed the submitted material(s) and the final diagnosis reflects that evaluation.              _____________________________________________________________________

## 2023-06-20 NOTE — Unmapped (Incomplete)
It was great to see you today.    The ID clinic phone number is [phone].  The ID clinic fax number is [phone].    Please note that your laboratory and other results may be visible to you in real time, possibly before they reach your provider. Please allow 48 hours for clinical interpretation of these results. Importantly, even if a result is flagged as abnormal, it may not be one that impacts your health.    For urgent issues on nights and weekends you may reach the ID Physician on call through the [facility] Operator at [phone].     URGENT CARE  Please call ahead to speak with the nursing staff if you are in need of an urgent appointment.       MEDICATIONS  For refills please contact your pharmacy and ask them to electronically send or fax the request to the clinic.   Please bring all medications in original bottles to every appointment.    [facility] (formerly [facility]) or [employer] Eligibility (required even if you do not receive medication through [facility])  Please remember to renew your Juanell Fairly eligibility during renewal periods which occur twice a year: January-March and July-September.     The following are needed for each renewal:   - [facility] Identification (if you don't have one, then a bill with your name and address in [place])   - proof of income (award letter, W-2, or last three check stubs)   If you are unable to come in for renewal, let us know if we can mail, fax or e-mail paperwork to you.   [facility]MAP Contact: [phone].     Neldon Mc, MD  [facility] Infectious Diseases Clinic at [facility]  [address]   [place], [place] [address]  Phone: [phone]   Fax: [phone]     Lab info:  Your most recent CD4 T-cell counts and viral loads are below. Here are a few things to keep in mind when looking at your numbers:  Our goal is to get your virus to be undetectable and keep it undetectable. If the virus is undetectable you are much more likely to stay healthy.   We consider your viral load to be undetectable if it says <20 or if it says Not detected.   For most people, we're checking CD4 counts every other visit (once or twice a year, or sometimes even less).  It's normal for your CD4 count to be different from visit to visit.   You can help by taking your medications at about the same time, every single day. If you're having trouble with taking your medications, it's important to let us know.    Lab Results   Component Value Date    ACD4 1,118 10/22/2018    CD4 54 10/22/2018    HIVCP <20 06/24/2020    HIVRS Not Detected 08/18/2021

## 2023-06-27 NOTE — Unmapped (Signed)
r     [facility]  [address]  [place], [place]  [address]  P [phone]  F [phone]     Primary care provider: Artelia Laroche, MD    The patient reports they are physically located in [place] and is currently: at home. I conducted a audio/video visit. I spent  58m 15s on the video call with the patient. I spent an additional 20 minutes on pre- and post-visit activities on the date of service .     Assessment/Plan:      Morgan Hebert, a 37 y.o. female seen today for routine HIV followup.    Plan:  HIV  Fills ART via private insurance. RW renewal UTD (caps on charges)  Continue current therapy. E-prescribed today. She uses [facility].  Checking HIV RNA & safety labs (brief return) (future order)  Encouraged continued excellent ARV adherence.   Lab Results   Component Value Date    ACD4 1,118 10/22/2018    CD4 54 10/22/2018    HIVCP <20 06/24/2020    HIVRS Not Detected 08/18/2021   12/15/22  CBC WNL  CMP WNL  Lipids HDL 39, LDL <90  A1C 5.5  RPR Neg  GC/CHL Neg (cervicovaginal)  CD4 1632 (54%)  HIV RNA <20  Pap NILM no HPV performed     Pseudotumor cerebri, chronic headaches  Sees [facility], [name]   MRI brain and MRV head completed 07/05/22, symptoms are stable  We have previously discussed medication use and possible role of marijuana in exacerbating her headaches and nausea (the latter is under better control lately). Lots of stress exacerbating these.  For neuro-ophtho followup - needs to schedule    COVID-19  Discussed again today. Counseled re the CDC's current recommendations to access updated Pfizer-BioNTech or Moderna COVID-19 vaccine to protect against sequelae of COVID-19, including hospitalization and death.   She received one dose in 10/2020. She does not think she is at risk and prefers to defer.  I reiterated my strong recommendation that she access this. She declined.    Mental Health  Symptoms are stable, exacerbated by stress. Feels need is less acute as kids are doing well and work is going well.  Overall doing well and feels safe.     Sexual health & secondary prevention  Sex with men. Monogamous with single partner. She does disclose status. Never uses condoms, we have discussed U=U. Has BTL.    Lab Results   Component Value Date    RPR Non Reactive 12/15/2022    LABRPR Nonreactive 06/12/2020    CTNAA Negative 12/15/2022    CTNAA Negative 05/24/2022    CTNAA Negative 05/24/2022    GCNAA Negative 12/15/2022    GCNAA Negative 05/24/2022    GCNAA Negative 05/24/2022    SPECTYPE Urine 10/28/2020    SPECTYPE Swab 10/28/2020    SPECTYPE Swab 10/28/2020    SPECSOURCE Urine 10/28/2020    SPECSOURCE Throat 10/28/2020    SPECSOURCE Rectum 10/28/2020     GC/CT NAATs --  Negative 12/2022    RPR -- NR 12/2022 - repeat 1Y      Health maintenance  Lab Results   Component Value Date    CREATININE 0.84 02/22/2023    QFTTBGOLD NEGATIVE 02/13/2014    HEPCAB Nonreactive 10/28/2020    CHOL 147 12/15/2022    HDL 39 (L) 12/15/2022    LDL 86 12/15/2022    NONHDL 108 12/15/2022    TRIG 119  12/15/2022    A1C 5.5 12/15/2022    PAP Negative for intraephithelial lesion or malignancy 03/29/2017    FINALDX  03/29/2019     A: Placenta, delivery    - Preterm (36 weeks 1 days EGA) singleton third trimester placenta, weight 320 g (weight 10th percentile for EGA)   - No acute chorioamnionitis identified   - Three vessel umbilical cord with narrow diameter (0.9 cm) and a furcate insertion  - Placental plate: accelerated maturation of chorionic villi,  increased intervillous and subchorionic fibrin, focal fetal thrombotic vasculopathy, small placental infarction (1.2 cm)          This electronic signature is attestation that the pathologist personally reviewed the submitted material(s) and the final diagnosis reflects that evaluation.       Health maintenance  - chronic, stable    Oral health  She does  have a dentist. Last dental exam within past year.    Eye health  She does  use corrective lenses. Last eye exam >1 year. She saw a local optometrist but has not followed up with ophthalmology.    Metabolic conditions  Wt Readings from Last 5 Encounters:   02/22/23 73.9 kg (163 lb)   01/18/23 74.6 kg (164 lb 6.4 oz)   08/17/22 78.3 kg (172 lb 9.6 oz)   06/07/22 77.5 kg (170 lb 12.8 oz)   04/28/22 76.7 kg (169 lb)     Lab Results   Component Value Date    CREATININE 0.84 02/22/2023    PROTEINUA Negative 02/19/2020    PROTEINUR <4.0 03/29/2019    GLUCOSEU Negative 02/19/2020    PCRATIOUR  03/29/2019      Comment:      Unable to calculate due to value below lower limit of assay linearity.    GLU 101 02/22/2023    A1C 5.5 12/15/2022    ALT <7 (L) 02/22/2023    ALT 11 12/15/2022    ALT 19 12/14/2021     # Kidney health - urine studies needed but deferred to future visit  # Bone health - assessment not yet needed (under age 68)  # Diabetes assessment -  Recent A1c WNL (12/2022)  # NAFLD assessment - monitor over time    Communicable diseases  Lab Results   Component Value Date    QFTTBGOLD NEGATIVE 02/13/2014    HEPAIGG Nonreactive 02/13/2014    HEPBSAB Reactive 02/13/2014    HEPCAB Nonreactive 10/28/2020    RUBIG Negative 05/30/2018    VZVIGG Positive 05/30/2018     # TB screening - no longer needed; negative IGRA, low risk  # Hepatitis screening - repeat HCV screen periodically  # MMR screening -  Rubella nonimmune 2019 - got MMR x 1 2020, needs recheck    Cancer screening  Lab Results   Component Value Date    PAP Negative for intraephithelial lesion or malignancy 03/29/2017    FINALDX  03/29/2019     A: Placenta, delivery    - Preterm (36 weeks 1 days EGA) singleton third trimester placenta, weight 320 g (weight 10th percentile for EGA)   - No acute chorioamnionitis identified   - Three vessel umbilical cord with narrow diameter (0.9 cm) and a furcate insertion  - Placental plate: accelerated maturation of chorionic villi,  increased intervillous and subchorionic fibrin, focal fetal thrombotic vasculopathy, small placental infarction (1.2 cm)          This electronic signature is attestation that the pathologist personally reviewed the submitted material(s)  and the final diagnosis reflects that evaluation.       # Cervical - repeat Pap 12 months from prior - NILM with no HPV performed 12/2022  # Breast - no indication for screening at present    # Anorectal - not yet done  # Colorectal - screening not indicated  # Liver - no screening indicated  # Lung - screening not indicated    Cardiovascular disease  Lab Results   Component Value Date    CHOL 147 12/15/2022    HDL 39 (L) 12/15/2022    LDL 86 12/15/2022    NONHDL 108 12/15/2022    TRIG 119 12/15/2022     # The ASCVD Risk score (Arnett DK, et al., 2019) failed to calculate.  - is not taking aspirin   - is not taking statin  - BP control excellent  - former smoker  # AAA screening - no indication for screening    Immunization History   Administered Date(s) Administered    COVID-19 VACC,MRNA,(PFIZER)(PF) 10/28/2020    Influenza Vaccine Quad(IM)6 MO-Adult(PF) 06/30/2015, 06/15/2016, 07/04/2018, 10/28/2020, 08/18/2021    MMR 03/31/2019    PNEUMOCOCCAL POLYSACCHARIDE 23-VALENT 03/31/2019    TdaP 02/05/2019     Immunizations today -  Discussed need for bivalent COVID booster and patient declines recommended immunization(s). Advised to get COVID, flu, PCV20. She does not want to get COVID.    I personally spent 35 minutes face-to-face and non-face-to-face in the care of this patient, which includes all pre, intra, and post visit time on the date of service.  All documented time was specific to the E/M visit and does not include any procedures that may have been performed.    Disposition  Return to clinic 5-6 months or sooner if needed.    [name], MD, MPH   [facility] at [facility]  [address]   [place], [place] [address]  Phone: [phone]   Fax: [phone]    To do on RTC:  MMR   PCV-20  Linkage to MH?     Subjective:      Chief Complaint   HIV followup    HPI  Return patient visit for Morgan Hebert, a 37 y.o. woman with well-controlled HIV, pseudotumor cerebri with associated headaches.  Very difficult time lately. Going through separation with husband Loraine Leriche. Interested in marriage counseling.   Youngest daughter [name], born 03/2019) is doing great and loves school. Older daughter Gordy Savers) is as well, has custody of niece Lucita Lora) which has been rocky (older sister of niece who died, mom released from jail) but this is going much more smoothly. Older girls are in 11th and 12th grades (2024-25), both want to go into the [employer]. Lucita Lora is interested in studying psychology.  No more cigarettes, only marijuana  Only one COVID vaccine so far.    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; reduced carrier risk [x]  Hemoglobinopathy screening - normal adult hemoglobin  WGS study eligible: no  GENETIC COUNSELING PREVISIT SUMMARY Referring provider: [facility]  Indication: Aneuploidy screening Oth    Environmental allergies  Gastroesophageal reflux disease 06/07/2022    HIV (human immunodeficiency virus infection) (CMS-HCC)     HIV (human immunodeficiency virus infection) (CMS-HCC)     IIH (idiopathic intracranial hypertension)     IUD (intrauterine device) in place     Lymphadenopathy 02/11/2014    Migraine     daily; uses marijuana and tylenol for management; pseudotumor cerebri    Obesity     Pseudotumor cerebri     Rubella non-immune status, antepartum 01/14/2019    [ ]  PP MMR    STD (sexually transmitted disease)     Supervision of high risk pregnancy, unspecified, unspecified trimester 05/30/2018    Dating: 9/21 (Unsure of LMP as had a D&C for a missed AB in 06/2018 and discovered she was pregnant in 07/2018)                                    Prenatal Screening:  [x]  Prenatal labs reviewed [x]  SMA 2 copies/Hgb electropheresis wnl/CF [ ]   [x]  1 hr GTT= 250 but done 3 days after BMZ (4/22 and 4/23) --> Repeat 1 hr GTT  5-18= 229  [ ]  28 wk CBC/RPR [ ]  GBS   Fetal Screening:  [x]  aneuploidy scre    Threatened premature labor in third trimester 01/31/2019    Received BMZ 4/22 ([facility]4/23 [facility]) for concern for PTL. SVE cl/th/hi.    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 See bottom of this visit's encounter summary for details.  Current Outpatient Medications on File Prior to Visit   Medication Sig    bictegrav-emtricit-tenofov ala (BIKTARVY) 50-200-25 mg tablet Take 1 tablet by mouth daily.    omeprazole (PRILOSEC) 20 MG capsule Take 1 capsule (20 mg total) by mouth daily.    tizanidine (ZANAFLEX) 2 MG tablet Take 2 tablets (4 mg total) by mouth nightly.    topiramate (TOPAMAX) 50 MG tablet Take 2 tablets (100 mg total) by mouth nightly.     No current facility-administered medications on file prior to visit.     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     Social History  General - lives in [place] with her husband and her 2 daughters (born 2008, 2020). Also has custody of niece (2007).   Doreene Adas and his girlfriend are there sometimes as well.   2021 - Family trauma, her 12yo niece committed suicide and her aunt passed away from cancer.   Mom and sister are both positive (sister is perinatally infected, patient with presumed sexual transmission from female partner).  Works at home for [employer], dreams of becoming a Clinical research associate or a Therapist, music. Loves vampire shows like True Blood.   Sexual History - sex with men (husband only)  Substance Use - marijuana (smokes nightly), smoked maybe 3 tobacco cigarettes a day but quit in 2022.  Social History     Tobacco Use    Smoking status: Former     Current packs/day: 0.50     Average packs/day: 0.5 packs/day for 5.0 years (2.5 ttl pk-yrs)     Types: Cigarettes    Smokeless tobacco: Never   Substance Use Topics  Alcohol use: Yes     Alcohol/week: 4.0 standard drinks of alcohol     Types: 1 Glasses of wine, 1 Shots of liquor, 2 Standard drinks or equivalent per week     Comment: rare     Review of Systems  As per HPI. Remainder of 10 systems reviewed, negative.      Objective:      There were no vitals taken for this visit.  Wt Readings from Last 3 Encounters:   02/22/23 73.9 kg (163 lb)   01/18/23 74.6 kg (164 lb 6.4 oz)   08/17/22 78.3 kg (172 lb 9.6 oz)     Video visit. Last exam below.   Const looks well and attentive, alert, appropriate   Eyes sclerae anicteric, noninjected OU   ENT no thrush, leukoplakia or oral lesions   Lymph no cervical or supraclavicular LAD   CV RRR. No murmurs. No rub or gallop. S1/S2.   Lungs CTAB ant/post, normal work of breathing   GI Soft, no organomegaly. NTND. NABS.   GU deferred   Rectal deferred   Skin no petechiae, ecchymoses or rashes on full inspection.    MSK no joint tenderness and normal ROM throughout. No LE edema, feet warm with brisk DP/PT pulses.   Neuro CN II-XII grossly intact, MAEE, non focal   Psych Appropriate affect. Eye contact good. Linear thoughts. Fluent speech.     Laboratory Data  Reviewed in Epic today, using Synopsis and Chart Review filters.    Lab Results   Component Value Date    CREATININE 0.84 02/22/2023    QFTTBGOLD NEGATIVE 02/13/2014    HEPCAB Nonreactive 10/28/2020    CHOL 147 12/15/2022    HDL 39 (L) 12/15/2022    LDL 86 12/15/2022    NONHDL 108 12/15/2022    TRIG 119 12/15/2022    A1C 5.5 12/15/2022    PAP Negative for intraephithelial lesion or malignancy 03/29/2017    FINALDX  03/29/2019     A: Placenta, delivery    - Preterm (36 weeks 1 days EGA) singleton third trimester placenta, weight 320 g (weight 10th percentile for EGA)   - No acute chorioamnionitis identified   - Three vessel umbilical cord with narrow diameter (0.9 cm) and a furcate insertion  - Placental plate: accelerated maturation of chorionic villi,  increased intervillous and subchorionic fibrin, focal fetal thrombotic vasculopathy, small placental infarction (1.2 cm)          This electronic signature is attestation that the pathologist personally reviewed the submitted material(s) and the final diagnosis reflects that evaluation.              _____________________________________________________________________

## 2023-06-28 ENCOUNTER — Telehealth: Admit: 2023-06-28 | Discharge: 2023-06-29 | Payer: BLUE CROSS/BLUE SHIELD

## 2023-06-28 DIAGNOSIS — G932 Benign intracranial hypertension: Principal | ICD-10-CM

## 2023-06-28 DIAGNOSIS — Z9189 Other specified personal risk factors, not elsewhere classified: Principal | ICD-10-CM

## 2023-06-28 DIAGNOSIS — H53453 Other localized visual field defect, bilateral: Principal | ICD-10-CM

## 2023-06-28 DIAGNOSIS — Z5181 Encounter for therapeutic drug level monitoring: Principal | ICD-10-CM

## 2023-06-28 DIAGNOSIS — B20 Human immunodeficiency virus [HIV] disease: Principal | ICD-10-CM

## 2023-06-28 DIAGNOSIS — Z79899 Other long term (current) drug therapy: Principal | ICD-10-CM

## 2023-07-25 MED ORDER — PROCHLORPERAZINE MALEATE 10 MG TABLET
ORAL_TABLET | Freq: Two times a day (BID) | ORAL | 1 refills | 15 days | PRN
Start: 2023-07-25 — End: 2023-08-24

## 2023-07-25 NOTE — Unmapped (Signed)
Request received via interface.     Provider: [name]    Last Visit Date: 02/22/2023  Next Visit Date: Visit date not found    No results found for: CBC, CMP     No results found. However, due to the size of the patient record, not all encounters were searched. Please check Results Review for a complete set of results.

## 2023-07-27 MED ORDER — PROCHLORPERAZINE MALEATE 10 MG TABLET
ORAL_TABLET | Freq: Two times a day (BID) | ORAL | 0 refills | 15 days | Status: CP | PRN
Start: 2023-07-27 — End: 2023-08-26
  Filled 2023-07-28: qty 30, 15d supply, fill #0

## 2023-07-28 MED FILL — TIZANIDINE 2 MG TABLET: ORAL | 30 days supply | Qty: 60 | Fill #3

## 2023-07-28 NOTE — Unmapped (Signed)
Needs EKG before any additional refills

## 2023-08-17 NOTE — Unmapped (Signed)
Name: Morgan Hebert  Date: 08/17/2023  Address: [address]  [address] [place] [address]   [place] of Residence:  [facility]  Phone: [phone]     Started assessment with patient options: phone call    Is this the same address for mailing? Yes  If no, Mailing Address is:     What is your preferred method of contact? Phone Call    Is there anyone that you would want to add as your personal contact? No; if yes, please use SmartPhrase RWPersonalContactInfo to gather their contacts information.    N/A    Housing Status  Stable/Permanent; if so, what is their housing type: Renting and living - room, house, or apartment    Insurance  Medicaid    Marital Status  Separated    Tax Press photographer Status  Head of Household    Employment Status  Employed Full Time    Income  Salary/Wages    If no or low income, how are you meeting your basic needs?  Food Stamps/EBT    List Tax Household Members including relationship to you:   Leighton Ruff (Daughter)  Alphonsa Gin (Daughter)  Vincente Poli (Niece)    Someone in my household receives: No Household Income/Deductions of any kind  Specify who: Leighton Ruff (Daughter)  Alphonsa Gin (Daughter)  Vincente Poli (Niece)    Do you or anyone in your home have income adjustments? No    If yes, which adjustments do you have? N/A      Medication Access/Barriers: none, medicaid    Do you have a current diagnosis for Hepatitis C?  Lab Results   Component Value Date    HEPCAB Nonreactive 10/28/2020       Federal Marketplace Eligibility Assessment  Patient has affordable insurance through [employer], [place], and or Employment and is not eligible.    Patient given ACA education if they qualified based on answers to questions above.     MyChart  Do you have an active MyChart account? Yes     If MyChart is not set up, informed patient on how to set up MyChart N/A    Patient was informed of the following programs;   N/A    The following applications/handouts were given to patient:   N/A    The following forms were also started with the patient:   N/A    Medicaid:  N/A      Ryan White/HMAP application status: Incomplete; patient needs to send proof of income (2 paystubs)    Patient is applying for [employer] on Charges Only     Additional Comments: sent email for proof of income.        Mickle Asper,  Benefits & Eligibility Coordinator  Time of Intervention: 7 minutes

## 2023-08-24 DIAGNOSIS — K219 Gastro-esophageal reflux disease without esophagitis: Principal | ICD-10-CM

## 2023-08-24 MED ORDER — PROCHLORPERAZINE MALEATE 10 MG TABLET
ORAL_TABLET | Freq: Two times a day (BID) | ORAL | 0 refills | 15 days | PRN
Start: 2023-08-24 — End: 2023-09-23

## 2023-08-24 MED ORDER — OMEPRAZOLE 20 MG CAPSULE,DELAYED RELEASE
ORAL_CAPSULE | Freq: Every day | ORAL | 3 refills | 90 days
Start: 2023-08-24 — End: 2024-08-23

## 2023-08-24 MED ORDER — TOPIRAMATE 50 MG TABLET
ORAL_TABLET | Freq: Every evening | ORAL | 1 refills | 90 days
Start: 2023-08-24 — End: 2024-02-20

## 2023-08-24 NOTE — Unmapped (Signed)
Refill request received from patient.      Medication Requested: Topiramate 50 mg        Prochlorperazine 10 mg  Last Office Visit: 02/22/2023   Next Office Visit: Visit date not found  Last Prescriber: [name]    Nurse refill requirements met? No  If not met, why: ALLERGY/CONTRAINDICATION  Medication not on active SOP//outside CMA scope to deny or approve refill request     Sent to: Provider for signing  If sent to provider, which provider?: [name]

## 2023-08-25 NOTE — Unmapped (Signed)
1st attempt mychart message sent to patient     Morgan Hebert

## 2023-08-28 MED ORDER — PROCHLORPERAZINE MALEATE 10 MG TABLET
ORAL_TABLET | Freq: Two times a day (BID) | ORAL | 0 refills | 15 days | PRN
Start: 2023-08-28 — End: 2023-09-27

## 2023-08-28 MED ORDER — TOPIRAMATE 50 MG TABLET
ORAL_TABLET | Freq: Every evening | ORAL | 1 refills | 90 days | Status: CP
Start: 2023-08-28 — End: 2024-02-24
  Filled 2023-09-05: qty 180, 90d supply, fill #0

## 2023-08-28 NOTE — Unmapped (Signed)
We have attempted to contact this patient, including:  - Giving the patient a call  - Sending a text message  - Sending a MyChart message  A recall as also been entered so that patient will receive additional contact attempts. As soon as the patient responds we will schedule the visit.   Renea Ee

## 2023-08-28 NOTE — Unmapped (Signed)
2nd attempt unable to contact by phone and vm is full- text message and recall placed     [facility]

## 2023-08-28 NOTE — Unmapped (Signed)
Will refill topamax; cannot refill compazine until patient has had EKG

## 2023-08-31 NOTE — Unmapped (Signed)
[facility] Refill Coordination Note    Specialty Medication(s) to be Shipped:   Infectious Disease: Biktarvy    Other medication(s) to be shipped: tizanidine 2 MG tablet (ZANAFLEX), topiramate 50 MG tablet (TOPAMAX)     Morgan Hebert, DOB: 08-17-1986  Phone: [phone] (home) [phone] (work)      All above HIPAA information was verified with patient.     Was a Nurse, learning disability used for this call? No    Completed refill call assessment today to schedule patient's medication shipment from the [facility]  [phone]).  All relevant notes have been reviewed.     Specialty medication(s) and dose(s) confirmed: Regimen is correct and unchanged.   Changes to medications: Gizelle reports no changes at this time.  Changes to insurance: No  New side effects reported not previously addressed with a pharmacist or physician: None reported  Questions for the pharmacist: No    Confirmed patient received a Conservation officer, historic buildings and a Surveyor, mining with first shipment. The patient will receive a drug information handout for each medication shipped and additional FDA Medication Guides as required.       DISEASE/MEDICATION-SPECIFIC INFORMATION        N/A    SPECIALTY MEDICATION ADHERENCE     Medication Adherence    Patient reported X missed doses in the last month: 0  Specialty Medication: biktarvy 50-200-25mg   Patient is on additional specialty medications: No  Patient is on more than two specialty medications: No  Any gaps in refill history greater than 2 weeks in the last 3 months: no  Demonstrates understanding of importance of adherence: yes              Were doses missed due to medication being on hold? No    BIKTARVY 50-200-25   mg: 7 days of medicine on hand       REFERRAL TO PHARMACIST     Referral to the pharmacist: Not needed      [facility]     Shipping address confirmed in Epic.       Delivery Scheduled: Yes, Expected medication delivery date: 09/05/23. Medication will be delivered via Same Day Courier to the prescription address in Epic WAM.    Moshe Salisbury   [facility]  Specialty Technician

## 2023-09-05 MED FILL — TIZANIDINE 2 MG TABLET: ORAL | 30 days supply | Qty: 60 | Fill #4

## 2023-09-05 MED FILL — BIKTARVY 50 MG-200 MG-25 MG TABLET: ORAL | 90 days supply | Qty: 90 | Fill #2

## 2023-10-31 MED FILL — TIZANIDINE 2 MG TABLET: ORAL | 30 days supply | Qty: 60 | Fill #5

## 2023-11-21 NOTE — Unmapped (Signed)
Morgan Hebert is a 38 y.o. female here for a MW[facility] Study Visit (IRB [phone]) on 11/21/2023. HIV positive participant. Denies complaints today. Pt is engaged in care at [facility] ID Sandi Mariscal), takes [place] for ARVs.     Significant Medical History: Psuedotumor cerebri  Medications: biktarvy, tizanidine, topiramate, benadryl, ibuprofen  PCP: Brooke Dare at [facility]      Negative COVID-19 Screen: Wellness screening and temperature check conducted at clinic door. Participant reported no COVID-19 symptoms, no contact to a known or suspected COVID-19 case in the last 14 days, and temperature was within normal limits. Participant was cleared to enter the facility.     Study visit included:    Pregnancy Test: negative    Physical Exam:  Blood Pressure, Height/Weight, In-Body Analysis , BIA Body Measurements , and Fibroscan and nasal strip exam     Gyn Exam:   No lesions, cysts, or nodules, detected in the external genitalia. Skin color consistent with race, no erythema or swelling noted. Labia majora are symmetrical, and well formed. Clitoris intact with good hygiene. Labia minora are dark pink, moist and symmetrical. Cervical opening is smooth, pink and even, no redness, inflammation or cyanosis detected. Cervix midline, Os is small and round without visible lesions. Vaginal wall is pink, deeply rugated, moist and smooth without inflammation or lesions. Bi-manual exam deferred and not part of study protocol.      GYN specimens collected: Thin prep pap w/reflex  Wet prep: not indicated    Labs Collected: CMP, CBC w/diff, CD4, HIV Viral Load, fasting glucose, HgA1C, fasting lipids    All labs results will be documented in Epic.

## 2023-11-29 ENCOUNTER — Encounter: Admit: 2023-11-29 | Discharge: 2023-11-30 | Payer: BLUE CROSS/BLUE SHIELD

## 2023-11-29 DIAGNOSIS — B20 Human immunodeficiency virus [HIV] disease: Principal | ICD-10-CM

## 2023-11-29 MED ORDER — BIKTARVY 50 MG-200 MG-25 MG TABLET
ORAL_TABLET | Freq: Every day | ORAL | 3 refills | 90.00 days | Status: CP
Start: 2023-11-29 — End: 2024-11-28
  Filled 2024-01-25: qty 90, 90d supply, fill #0

## 2023-11-29 NOTE — Unmapped (Signed)
It was great to see you today.    The ID clinic phone number is [phone].  The ID clinic fax number is [phone].    Please note that your laboratory and other results may be visible to you in real time, possibly before they reach your provider. Please allow 48 hours for clinical interpretation of these results. Importantly, even if a result is flagged as abnormal, it may not be one that impacts your health.    For urgent issues on nights and weekends you may reach the ID Physician on call through the [facility] Operator at [phone].     URGENT CARE  Please call ahead to speak with the nursing staff if you are in need of an urgent appointment.       MEDICATIONS  For refills please contact your pharmacy and ask them to electronically send or fax the request to the clinic.   Please bring all medications in original bottles to every appointment.    HMAP (formerly ADAP) or [employer] Eligibility (required even if you do not receive medication through [facility])  Please remember to renew your Juanell Fairly eligibility during renewal periods which occur twice a year: January-March and July-September.     The following are needed for each renewal:   - [facility] Identification (if you don't have one, then a bill with your name and address in [place])   - proof of income (award letter, W-2, or last three check stubs)   If you are unable to come in for renewal, let us know if we can mail, fax or e-mail paperwork to you.   HMAP Contact: [phone].     Neldon Mc, MD  [facility] Diseases [facility] at [facility]  [address]   [place], [place] [address]  Phone: [phone]   Fax: [phone]     Lab info:  Your most recent CD4 T-cell counts and viral loads are below. Here are a few things to keep in mind when looking at your numbers:  Our goal is to get your virus to be undetectable and keep it undetectable. If the virus is undetectable you are much more likely to stay healthy.   We consider your viral load to be undetectable if it says <20 or if it says Not detected.   For most people, we're checking CD4 counts every other visit (once or twice a year, or sometimes even less).  It's normal for your CD4 count to be different from visit to visit.   You can help by taking your medications at about the same time, every single day. If you're having trouble with taking your medications, it's important to let us know.    Lab Results   Component Value Date    ACD4 1,118 10/22/2018    CD4 54 10/22/2018    HIVCP <20 06/24/2020    HIVRS Not Detected 08/18/2021

## 2023-11-29 NOTE — Unmapped (Signed)
 r     INFECTIOUS DISEASES CLINIC  [address]  [place], [place]  [address]  P [phone]  F [phone]     Primary care provider: Artelia Laroche, MD    The patient reports they are physically located in [place] and is currently: at home. I conducted a audio/video visit. I spent  23m 31s on the video call with the patient. I spent an additional 25 minutes on pre- and post-visit activities on the date of service .     Assessment/Plan:      Morgan Hebert, a 38 y.o. female seen today for routine HIV followup.    Plan:  HIV  Fills ART via Medicaid. RW renewal UTD (caps on charges)  Continue current therapy. E-prescribed today. She uses Insurance claims handler.  Labs drawn through study    Encouraged continued excellent ARV adherence.   Lab Results   Component Value Date    ACD4 1,118 10/22/2018    CD4 54 10/22/2018    HIVCP <20 06/24/2020    HIVRS Not Detected 08/18/2021   11/21/23  Labs: CMP, CBC w/diff, CD4, VL, fasting glucose, HgA1C, fasting lipids      Pseudotumor cerebri, chronic headaches  Sees [facility] Neu[facility], [name]   MRI brain and MRV head completed 07/05/22, symptoms are stable  We have previously discussed medication use and possible role of marijuana in exacerbating her headaches and nausea (the latter is under better control lately). Lots of stress exacerbating these.  For neuro-ophtho followup - needs to schedule    COVID-19  Discussed again today. Counseled re the CDC's current recommendations to access updated Pfizer-BioNTech or Moderna COVID-19 vaccine to protect against sequelae of COVID-19, including hospitalization and death.   She received one dose in 10/2020. She does not think she is at risk and prefers to defer.  I reiterated my strong recommendation that she access this. She declined.    Mental Health  Symptoms are stable, exacerbated by stress. Feels need is less acute as kids are doing well and work is going well.  Overall doing well and feels safe.     Sexual health & secondary prevention  Sex with men. Monogamous with single partner. She does disclose status. Never uses condoms, we have discussed U=U. Has BTL.    Lab Results   Component Value Date    RPR Non Reactive 11/21/2023    LABRPR Nonreactive 06/12/2020    CTNAA Negative 12/15/2022    CTNAA Negative 05/24/2022    CTNAA Negative 05/24/2022    GCNAA Negative 12/15/2022    GCNAA Negative 05/24/2022    GCNAA Negative 05/24/2022    SPECTYPE Urine 10/28/2020    SPECTYPE Swab 10/28/2020    SPECTYPE Swab 10/28/2020    SPECSOURCE Urine 10/28/2020    SPECSOURCE Throat 10/28/2020    SPECSOURCE Rectum 10/28/2020     GC/CT NAATs --  Negative 12/2022    RPR -- NR 11/2023 - repeat 1Y      Health maintenance  Lab Results   Component Value Date    CREATININE 0.95 11/21/2023    QFTTBGOLD NEGATIVE 02/13/2014    HEPCAB Nonreactive 10/28/2020    CHOL 135 11/21/2023    HDL 45 11/21/2023    LDL 76 11/21/2023    NONHDL 90 11/21/2023    TRIG 66 11/21/2023    A1C 5.4 11/21/2023    PAP Negative for intraephithelial lesion or malignancy 03/29/2017    FINALDX  03/29/2019  A: Placenta, delivery    - Preterm (36 weeks 1 days EGA) singleton third trimester placenta, weight 320 g (weight 10th percentile for EGA)   - No acute chorioamnionitis identified   - Three vessel umbilical cord with narrow diameter (0.9 cm) and a furcate insertion  - Placental plate: accelerated maturation of chorionic villi,  increased intervillous and subchorionic fibrin, focal fetal thrombotic vasculopathy, small placental infarction (1.2 cm)          This electronic signature is attestation that the pathologist personally reviewed the submitted material(s) and the final diagnosis reflects that evaluation.       Health maintenance  - chronic, stable  Oral health  She does  have a dentist. Last dental exam within past year.    Eye health  She does  use corrective lenses. Last eye exam >1 year. She saw a local optometrist but has not followed up with ophthalmology.    Metabolic conditions  Wt Readings from Last 5 Encounters:   06/28/23 67.6 kg (149 lb)   02/22/23 73.9 kg (163 lb)   01/18/23 74.6 kg (164 lb 6.4 oz)   08/17/22 78.3 kg (172 lb 9.6 oz)   06/07/22 77.5 kg (170 lb 12.8 oz)     Lab Results   Component Value Date    CREATININE 0.95 11/21/2023    PROTEINUA Negative 02/19/2020    PROTEINUR <4.0 03/29/2019    GLUCOSEU Negative 02/19/2020    PCRATIOUR  03/29/2019      Comment:      Unable to calculate due to value below lower limit of assay linearity.    GLU 101 02/22/2023    A1C 5.4 11/21/2023    ALT 9 11/21/2023    ALT <7 (L) 02/22/2023    ALT 11 12/15/2022     # Kidney health - urine studies needed but deferred to future visit  # Bone health - assessment not yet needed (under age 47)  # Diabetes assessment -  Recent A1c WNL (11/2023)  # NAFLD assessment - monitor over time    Communicable diseases  Lab Results   Component Value Date    QFTTBGOLD NEGATIVE 02/13/2014    HEPAIGG Nonreactive 02/13/2014    HEPBSAB Reactive 02/13/2014    HEPCAB Nonreactive 10/28/2020    RUBIG Negative 05/30/2018    VZVIGG Positive 05/30/2018     # TB screening - no longer needed; negative IGRA, low risk  # Hepatitis screening - repeat HCV screen periodically  # MMR screening -  Rubella nonimmune 2019 - got MMR x 1 2020, needs recheck    Cancer screening  Lab Results   Component Value Date    PAP Negative for intraephithelial lesion or malignancy 03/29/2017    FINALDX  03/29/2019     A: Placenta, delivery    - Preterm (36 weeks 1 days EGA) singleton third trimester placenta, weight 320 g (weight 10th percentile for EGA)   - No acute chorioamnionitis identified   - Three vessel umbilical cord with narrow diameter (0.9 cm) and a furcate insertion  - Placental plate: accelerated maturation of chorionic villi,  increased intervillous and subchorionic fibrin, focal fetal thrombotic vasculopathy, small placental infarction (1.2 cm)          This electronic signature is attestation that the pathologist personally reviewed the submitted material(s) and the final diagnosis reflects that evaluation.       # Cervical - repeat Pap 12 months from prior - performed 11/2023 (with reflex HPV, awaiting results)  #  Breast - no indication for screening at present    # Anorectal - not yet done  # Colorectal - screening not indicated  # Liver - no screening indicated  # Lung - screening not indicated    Cardiovascular disease  Lab Results   Component Value Date    CHOL 135 11/21/2023    HDL 45 11/21/2023    LDL 76 11/21/2023    NONHDL 90 11/21/2023    TRIG 66 11/21/2023     # The ASCVD Risk score (Arnett DK, et al., 2019) failed to calculate.  - is not taking aspirin   - is not taking statin  - BP control excellent  - former smoker  # AAA screening - no indication for screening    Immunization History   Administered Date(s) Administered    COVID-19 VACC,MRNA,(PFIZER)(PF) 10/28/2020    Influenza Vaccine Quad(IM)6 MO-Adult(PF) 06/30/2015, 06/15/2016, 07/04/2018, 10/28/2020, 08/18/2021    MMR 03/31/2019    PNEUMOCOCCAL POLYSACCHARIDE 23-VALENT 03/31/2019    TdaP 02/05/2019     Immunizations today -  Discussed need for bivalent COVID booster and patient declines recommended immunization(s). Advised to get COVID, flu, PCV21    I personally spent 36 minutes face-to-face and non-face-to-face in the care of this patient, which includes all pre, intra, and post visit time on the date of service.  All documented time was specific to the E/M visit and does not include any procedures that may have been performed.    Disposition  Return to clinic 5-6 months or sooner if needed.    [name], MD, MPH   [facility] Infectious Diseases Clinic at [facility]  [address]   [place], [place] [address]  Phone: [phone]   Fax: [phone]    To do on RTC:  MMR   PCV-20  Linkage to MH?     Subjective:      Chief Complaint   HIV followup    HPI  Return patient visit for Morgan Hebert, a 38 y.o. woman with well-controlled HIV, pseudotumor cerebri with associated headaches.  Has URI sx today, no fever. Daughter is also sick.  Living separately from husband Loraine Leriche. Separated but amicably. Looking for a new job.  Youngest daughter [name], born 03/2019) is doing great and loves school. Older daughter Gordy Savers) is as well, has custody of niece Lucita Lora) who is now doing well (older sister of niece who died, mom released from jail). Older girls are in 11th and 12th grades (2024-25), both want to go to college,  No more cigarettes, only marijuana (smokes regularly/daily)  Only one COVID vaccine so far.    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; reduced carrier risk [x]  Hemoglobinopathy screening - normal adult hemoglobin  WGS study eligible: no  GENETIC COUNSELING PREVISIT SUMMARY Referring provider: [facility] MFM  Indication: Aneuploidy screening Oth    Environmental allergies     Gastroesophageal reflux disease 06/07/2022    HIV (human immunodeficiency virus infection) (CMS-HCC)     HIV (human immunodeficiency virus infection) (CMS-HCC)     IIH (idiopathic intracranial hypertension)     IUD (intrauterine device)  in place     Lymphadenopathy 02/11/2014    Migraine     daily; uses marijuana and tylenol for management; pseudotumor cerebri    Obesity     Pseudotumor cerebri     Rubella non-immune status, antepartum 01/14/2019    [ ]  PP MMR    STD (sexually transmitted disease)     Supervision of high risk pregnancy, unspecified, unspecified trimester 05/30/2018    Dating: 9/21 (Unsure of LMP as had a D&C for a missed AB in 06/2018 and discovered she was pregnant in 07/2018)                                    Prenatal Screening:  [x]  Prenatal labs reviewed [x]  SMA 2 copies/Hgb electropheresis wnl/CF [ ]   [x]  1 hr GTT= 250 but done 3 days after BMZ (4/22 and 4/23) --> Repeat 1 hr GTT  5-18= 229  [ ]  28 wk CBC/RPR [ ]  GBS   Fetal Screening:  [x]  aneuploidy scre    Threatened premature labor in third trimester 01/31/2019    Received BMZ 4/22 ()-4/23 [facility]) for concern for PTL. SVE cl/th/hi.    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 See bottom of this visit's encounter summary for details.  Current Outpatient Medications on File Prior to Visit   Medication Sig    bictegrav-emtricit-tenofov ala (BIKTARVY) 50-200-25 mg tablet Take 1 tablet by mouth daily.    tizanidine (ZANAFLEX) 2 MG tablet Take 2 tablets (4 mg total) by mouth nightly.    topiramate (TOPAMAX) 50 MG tablet Take 2 tablets (100 mg total) by mouth nightly.     No current facility-administered medications on file prior to visit.     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     Social History  General - lives in [place] with her husband and her 2 daughters (born 2008, 2020). Also has custody of niece (2007).   Doreene Adas and his girlfriend are there sometimes as well.   2021 - Family trauma, her 12yo niece committed suicide and her aunt passed away from cancer.   Mom and sister are both positive (sister is perinatally infected, patient with presumed sexual transmission from female partner).  Works at home for [employer], dreams of becoming a Clinical research associate or a Therapist, music. Loves vampire shows like True Blood.   Sexual History - sex with men (husband only)  Substance Use - marijuana (smokes nightly), smoked maybe 3 tobacco cigarettes a day but quit in 2022.  Social History     Tobacco Use    Smoking status: Former     Current packs/day: 0.50     Average packs/day: 0.5 packs/day for 5.0 years (2.5 ttl pk-yrs)     Types: Cigarettes    Smokeless tobacco: Never   Substance Use Topics    Alcohol use: Yes     Alcohol/week: 4.0 standard drinks of alcohol     Types: 1 Glasses of wine, 1 Shots of liquor, 2 Standard drinks or equivalent per week     Comment: rare     Review of Systems  As per HPI. Remainder of  10 systems reviewed, negative.      Objective:      There were no vitals taken for this visit.  Wt Readings from Last 3 Encounters:   06/28/23 67.6 kg (149 lb)   02/22/23 73.9 kg (163 lb)   01/18/23 74.6 kg (164 lb 6.4 oz)     Video visit. Last exam below.   Today 11/29/23 no increased WOB, no cough, animated and in a good mood.    Const looks well and attentive, alert, appropriate   Eyes sclerae anicteric, noninjected OU   ENT no thrush, leukoplakia or oral lesions   Lymph no cervical or supraclavicular LAD   CV RRR. No murmurs. No rub or gallop. S1/S2.   Lungs CTAB ant/post, normal work of breathing   GI Soft, no organomegaly. NTND. NABS.   GU deferred   Rectal deferred   Skin no petechiae, ecchymoses or rashes on full inspection.    MSK no joint tenderness and normal ROM throughout. No LE edema, feet warm with brisk DP/PT pulses.   Neuro CN II-XII grossly intact, MAEE, non focal   Psych Appropriate affect. Eye contact good. Linear thoughts. Fluent speech.     Laboratory Data  Reviewed in Epic today, using Synopsis and Chart Review filters.    Lab Results   Component Value Date    CREATININE 0.95 11/21/2023    QFTTBGOLD NEGATIVE 02/13/2014    HEPCAB Nonreactive 10/28/2020    CHOL 135 11/21/2023    HDL 45 11/21/2023    LDL 76 11/21/2023    NONHDL 90 11/21/2023    TRIG 66 11/21/2023    A1C 5.4 11/21/2023    PAP Negative for intraephithelial lesion or malignancy 03/29/2017    FINALDX  03/29/2019     A: Placenta, delivery    - Preterm (36 weeks 1 days EGA) singleton third trimester placenta, weight 320 g (weight 10th percentile for EGA)   - No acute chorioamnionitis identified   - Three vessel umbilical cord with narrow diameter (0.9 cm) and a furcate insertion  - Placental plate: accelerated maturation of chorionic villi,  increased intervillous and subchorionic fibrin, focal fetal thrombotic vasculopathy, small placental infarction (1.2 cm)          This electronic signature is attestation that the pathologist personally reviewed the submitted material(s) and the final diagnosis reflects that evaluation.              _____________________________________________________________________

## 2023-11-30 NOTE — Unmapped (Signed)
 Labs reviewed with participant     CD4 1198  VL 20  CMP WNL  Lipids WNL  RPR Neg  A1C 5.4  Pap unsatisfactory, HPV neg

## 2023-12-12 NOTE — Unmapped (Signed)
 The [facility] has made a third and final attempt to reach this patient to refill the following medication: Biktarvy.      We have left voicemails on the following phone numbers: [phone], have been unable to leave messages on the following phone numbers: [phone], have sent a MyChart message, and have sent a text message to the following phone numbers: [phone] .    Dates contacted: 11/13/23, 11/20/23, 12/12/23  Last scheduled delivery: 09/04/23 (90 day supply)    The patient may be at risk of non-compliance with this medication. The patient should call the [facility] at [phone]  Option 4, then Option 4: Infectious Disease, Transplant to refill medication.    Darryl Nestle, PharmD   [facility] Specialty Pharmacist

## 2023-12-13 ENCOUNTER — Ambulatory Visit: Admit: 2023-12-13 | Discharge: 2023-12-14 | Payer: BLUE CROSS/BLUE SHIELD

## 2023-12-13 DIAGNOSIS — Z006 Encounter for examination for normal comparison and control in clinical research program: Principal | ICD-10-CM

## 2023-12-13 NOTE — Unmapped (Signed)
 Participant underwent research head MRI at [facility]     Fourteen mLs of Dotarem were injected into a 22g IV placed in participant's left antecubital, at a rate of approximately 4 mLs/sec followed by a 10 mL saline flush.    Images taken     Administered 1 mL of Acetazolamide solution, waited 30 seconds, administered 1 mL and waited 30 seconds, and repeated until all 20 mLs of the solution was administered. After study drug administration was completed, flushed with 10 mLs of sodium chloride 0.9%.    Images taken      IV line removed upon MRI scan completed.    Participant tolerated the contrast, study med, and IV removal without complaints or complications.

## 2023-12-26 NOTE — Unmapped (Signed)
 Research MRI/MRA Interpretation    12/26/23  Morgan Hebert ([birth year], 38 y.o.)    This individual is enrolled in the MACS/WIHS Combined Cohort Study. As part of this study, the individual enrolled in a brain imaging protocol to assess cerebrovascular injury and immune activation. The participant underwent a research MRI/MRA that included measures of vasoreactivity, large vessel disease, and small vessel injury on 12/13/2023. Imaging was conducted at the [facility] ([facility]) at the [place] [facility] [place] at [facility] on 3T MR scanners using a standard multi-channel phased array head coil.     A member of the research team who is a neuroradiologist has reviewed the research images and identified the following findings: .     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    -Tiny enhancing lesions in the right Meckel's cave. 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     Because this is a research assessment, we recommend that any findings that may be potentially clinically significant be further evaluated in the clinical setting. Please note that the research images are not stored on the [facility] medical record system.     The recommendation from our research team is  to consider an ENT referral to evaluate chronic sinusitis and a repeat MRI Wo/W contrast with a dedicated skull base protocol to further evaluate the lesions in the right Meckel's cave.     The participant was contacted on 12/26/2023 and results reviewed. Additionally, we have notified her PCP and neurologist.     Inquiries regarding these results can be directed to the contacts below:     Study Clinician  Tonita Phoenix via EPIC or at [email or web address]     Study Principal Investigators    Crecencio Mc- Moore via EPIC or at [email or web address]   M. Billy Coast via EPIC or at [email or web address]     Study Neuroradiologist  Velva Harman via EPIC or at [email or web address]

## 2024-01-23 MED ORDER — PROCHLORPERAZINE MALEATE 10 MG TABLET
ORAL_TABLET | Freq: Two times a day (BID) | ORAL | 0 refills | 15.00 days | PRN
Start: 2024-01-23 — End: 2024-02-22

## 2024-01-23 MED ORDER — TIZANIDINE 2 MG TABLET
ORAL_TABLET | Freq: Every evening | ORAL | 5 refills | 30.00 days
Start: 2024-01-23 — End: 2024-07-21

## 2024-01-23 NOTE — Unmapped (Signed)
 [facility] Clinical Assessment & Refill Coordination Note    Morgan Hebert, DOB: 05/13/1986  Phone: [phone] (home) [phone] (work)    All above HIPAA information was verified with patient.     Was a Nurse, learning disability used for this call? No    Specialty Medication(s):   Infectious Disease: Biktarvy      Current Outpatient Medications   Medication Sig Dispense Refill    bictegrav-emtricit-tenofov ala (BIKTARVY ) 50-200-25 mg tablet Take 1 tablet by mouth daily. 90 tablet 3    topiramate  (TOPAMAX ) 50 MG tablet Take 2 tablets (100 mg total) by mouth nightly. 180 tablet 1     No current facility-administered medications for this visit.        Changes to medications: Morgan Hebert reports no changes at this time.    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       Changes to allergies: No    Allergies have been reviewed and updated in Epic: Yes    SPECIALTY MEDICATION ADHERENCE     Biktarvy   50-200-25 mg: unable to assess doses of medicine on hand       Medication Adherence    Patient reported X missed doses in the last month: 0  Specialty Medication: Biktarvy  50-200-25mg  QD  Patient is on additional specialty medications: No  Informant: patient          Specialty medication(s) dose(s) confirmed: Regimen is correct and unchanged.     Are there any concerns with adherence? No    Adherence counseling provided? Not needed    CLINICAL MANAGEMENT AND INTERVENTION      Clinical Benefit Assessment:    Do you feel the medicine is effective or helping your condition? Yes    Clinical Benefit counseling provided? Labs from 11/2023 show evidence of clinical benefit    Adverse Effects Assessment:    Are you experiencing any side effects? No    Are you experiencing difficulty administering your medicine? No    Quality of Life Assessment:    Quality of Life Rheumatology  Oncology  Dermatology  Cystic Fibrosis          How many days over the past month did your HIV  keep you from your normal activities? For example, brushing your teeth or getting up in the morning. Patient declined to answer    Have you discussed this with your provider? Not needed    Acute Infection Status:    Acute infections noted within Epic:  No active infections    Patient reported infection: None    Therapy Appropriateness:    Is therapy appropriate based on current medication list, adverse reactions, adherence, clinical benefit and progress toward achieving therapeutic goals? Yes, therapy is appropriate and should be continued     Clinical Intervention:    Was an intervention completed as part of this clinical assessment? No    DISEASE/MEDICATION-SPECIFIC INFORMATION      N/A    HIV: Not Applicable    HIV ASSOCIATED LABS:     Lab Results   Component Value Date/Time    HIVRS Not Detected 08/18/2021 05:05 PM    HIVRS Not Detected 10/28/2020 10:17 AM    HIVRS Detected (A) 02/25/2019 10:12 AM    HIVRS 42,400 10/23/2017 12:00 AM    HIVRS not detected  03/29/2017 12:00 AM    HIVRS detected 10/25/2016 12:00 AM    HIVCP <20 06/24/2020 08:23 AM    HIVCP <20 06/12/2020 11:43 AM    HIVCP <40 (H) 02/25/2019 10:12 AM    HIVCP <40 (H) 07/04/2018 02:06 PM    HIVCP detected 10/23/2017 12:00 AM    RCD4 54.4 05/01/2015 12:00 AM    RCD4 53.7 10/24/2014 12:00 AM    ACD4 1,118 10/22/2018 09:45 AM    ACD4 1,696 (A) 05/01/2015 12:00 AM    ACD4 967 10/24/2014 12:00 AM    ACD4 854 06/23/2014 12:26 PM     Lab Results   Component Value Date    JOHNSCOV Negative 11/21/2023         PATIENT SPECIFIC NEEDS     Does the patient have any physical, cognitive, or cultural barriers? No    Is the patient high risk? No    Does the patient require physician intervention or other additional services (i.e., nutrition, smoking cessation, social work)? No    Does the patient have an additional or emergency contact listed in their chart? Yes    SOCIAL DETERMINANTS OF HEALTH     At the [facility], we have learned that life circumstances - like trouble affording food, housing, utilities, or transportation can affect the health of many of our patients.   That is why we wanted to ask: are you currently experiencing any life circumstances that are negatively impacting your health and/or quality of life? Patient declined to answer    Social Drivers of Health     Food Insecurity: No Food Insecurity (02/07/2019)    Received from [facility], [facility]    Hunger Vital Sign     Worried About Running Out of Food in the Last Year: Never true     Ran Out of Food in the Last Year: Never true   Tobacco Use: Medium Risk (02/22/2023)    Patient History     Smoking Tobacco Use: Former     Smokeless Tobacco Use: Never     Passive Exposure: Not on file   Transportation Needs: No Transportation Needs (02/07/2019)    Received from [facility], [facility]    PRAPARE - Transportation     Lack of Transportation (Medical): No     Lack of Transportation (Non-Medical): No   Alcohol Use: Not At Risk (02/11/2020)    Alcohol Use     How often do you have a drink containing alcohol?: 2 - 4 times per month     How many drinks containing alcohol do you have on a typical day when you are drinking?: 1 - 2     How often do you have 5 or more drinks on one occasion?: Never   Housing: Unknown (01/17/2021)    Housing     Within the past 12 months, have you ever stayed: outside, in a car, in a tent, in an overnight shelter, or temporarily in someone else's home (i.e. couch-surfing)?: No     Are you worried about losing your housing?: Not on file   Physical Activity: Inactive (02/07/2019)    Received from [facility], [facility]    Exercise Vital Sign     Days of Exercise per Week: 0 days     Minutes of Exercise per Session: 0 min   Utilities: Not on file   Stress: No Stress Concern Present (02/07/2019)    Received from [facility], [facility]    [facility]  of Occupational Health - Occupational Stress Questionnaire     Feeling of Stress : Not at all   Interpersonal Safety: Not on file   Substance Use: Not on file (08/15/2023)   Intimate Partner Violence: Not At Risk (02/07/2019)    Received from [facility], [facility]    Humiliation, Afraid, Rape, and Kick questionnaire     Fear of Current or Ex-Partner: No     Emotionally Abused: No     Physically Abused: No     Sexually Abused: No   Social Connections: Unknown (02/07/2019)    Received from [facility], [facility]    Social Connection and Isolation Panel [NHANES]     Frequency of Communication with Friends and Family: Patient declined     Frequency of Social Gatherings with Friends and Family: Patient declined     Attends Religious Services: Patient declined     Database administrator or Organizations: Patient declined     Attends Banker Meetings: Patient declined     Marital Status: Patient declined   Programmer, applications: Low Risk  (02/07/2019)    Received from [facility], [facility]    Overall Financial Resource Strain (CARDIA)     Difficulty of Paying Living Expenses: Not hard at all   Depression: Not at risk (04/28/2022)    PHQ-2     PHQ-2 Score: 0   Internet Connectivity: Not on file   Health Literacy: Low Risk  (01/17/2021)    Health Literacy     : Never       Would you be willing to receive help with any of the needs that you have identified today? Not applicable       SHIPPING     Specialty Medication(s) to be Shipped:   Infectious Disease: Biktarvy     Other medication(s) to be shipped:  topiramate , tizanidine , prochlorperazine      Changes to insurance: No    Cost and Payment: Patient has a copay of $12. They are aware and have authorized the pharmacy to charge the credit card on file.    Delivery Scheduled: Yes, Expected medication delivery date: 01/26/24.     Medication will be delivered via Next Day Courier to the confirmed prescription address in [facility].    The patient will receive a drug information handout for each medication shipped and additional FDA Medication Guides as required.  Verified that patient has previously received a Conservation officer, historic buildings and a Surveyor, mining.    The patient or caregiver noted above participated in the development of this care plan and knows that they can request review of or adjustments to the care plan at any time.      All of the patient's questions and concerns have been addressed.    Riesa Chary, PharmD   [facility] Specialty Pharmacist

## 2024-01-23 NOTE — Unmapped (Signed)
 Refill request received from patient.      Medication Requested: Tizanidine  2 mg        Prochlorperazine  10 mg   Last Office Visit: 02/22/2023   Next Office Visit: Visit date not found  Last Prescriber: Dr. Earnestine Goes     Nurse refill requirements met? No  If not met, why: Not on active medication list     Sent to: Provider for signing  If sent to provider, which provider?: Dr. Earnestine Goes

## 2024-01-25 MED FILL — TOPIRAMATE 50 MG TABLET: ORAL | 90 days supply | Qty: 180 | Fill #1

## 2024-02-01 MED ORDER — PROCHLORPERAZINE MALEATE 10 MG TABLET
ORAL_TABLET | Freq: Two times a day (BID) | ORAL | 0 refills | 15.00 days | Status: CP | PRN
Start: 2024-02-01 — End: 2024-03-02
  Filled 2024-02-01: qty 30, 15d supply, fill #0

## 2024-02-01 MED ORDER — TIZANIDINE 2 MG TABLET
ORAL_TABLET | Freq: Every evening | ORAL | 5 refills | 30.00 days | Status: CP
Start: 2024-02-01 — End: 2024-07-30
  Filled 2024-02-01: qty 60, 30d supply, fill #0

## 2024-02-27 ENCOUNTER — Ambulatory Visit: Admit: 2024-02-27 | Payer: Medicaid (Managed Care)

## 2024-04-09 NOTE — Unmapped (Signed)
 [facility] Refill Coordination Note    Specialty Medication(s) to be Shipped:   Infectious Disease: Biktarvy     Other medication(s) to be shipped: tizanidine  2 MG tablet (ZANAFLEX )     Morgan Hebert, DOB: 01/12/1986  Phone: [phone] (home) [phone] (work)      All above HIPAA information was verified with patient.     Was a Nurse, learning disability used for this call? No    Completed refill call assessment today to schedule patient's medication shipment from the [facility]  [phone]).  All relevant notes have been reviewed.     Specialty medication(s) and dose(s) confirmed: Regimen is correct and unchanged.   Changes to medications: Tamitha reports no changes at this time.  Changes to insurance: No  New side effects reported not previously addressed with a pharmacist or physician: None reported  Questions for the pharmacist: No    Confirmed patient received a Conservation officer, historic buildings and a Surveyor, mining with first shipment. The patient will receive a drug information handout for each medication shipped and additional FDA Medication Guides as required.       DISEASE/MEDICATION-SPECIFIC INFORMATION        N/A    SPECIALTY MEDICATION ADHERENCE     Medication Adherence    Patient reported X missed doses in the last month: 0  Specialty Medication: BIKTARVY  50-200-25 mg tablet (bictegrav-emtricit-tenofov ala)              Were doses missed due to medication being on hold? No     BIKTARVY  50-200-25 mg tablet (bictegrav-emtricit-tenofov ala): 14-15 days of medicine on hand       REFERRAL TO PHARMACIST     Referral to the pharmacist: Not needed      [facility]     Shipping address confirmed in Epic.     Cost and Payment: Patient has a copay of $4. They are aware and have authorized the pharmacy to charge the credit card on file.    Delivery Scheduled: Yes, Expected medication delivery date: 04/17/2024.     Medication will be delivered via Next Day Courier to the prescription address in Epic [facility].    Morgan Hebert   [facility]  Specialty Technician

## 2024-04-16 MED FILL — TIZANIDINE 2 MG TABLET: ORAL | 30 days supply | Qty: 60 | Fill #1

## 2024-04-16 NOTE — Unmapped (Signed)
 INFECTIOUS DISEASES CLINIC  [address]  [place], [place]  [address]  P [phone]  F [phone]     Primary care provider: Myrna Velia Canales, MD    The patient reports they are physically located in [place]  and is currently: not at home. I conducted a audio/video visit. I spent  61m 35s on the video call with the patient. I spent an additional 24 minutes on pre- and post-visit activities on the date of service .     Assessment/Plan:      Morgan Hebert, a 38 y.o. female seen today for routine HIV followup.    Plan:  HIV  Fills ART via Medicaid. RW renewal UTD (caps on charges)  Continue current therapy. E-prescribed today. She uses Insurance claims handler.  Checking HIV RNA & safety labs (brief return) - future order    Encouraged continued excellent ARV adherence.   Lab Results   Component Value Date    ACD4 1,118 10/22/2018    CD4 54 10/22/2018    HIVCP <20 06/24/2020    HIVRS Not Detected 08/18/2021   11/21/23  Labs: CMP, CBC w/diff, CD4, VL, fasting glucose, HgA1C, fasting lipids      Pseudotumor cerebri, chronic headaches  Sees [facility], Dr. Lenny but not seen seen recently  MRI brain and MRV head completed 07/05/22, symptoms are stable. Research MRA/MRV performed 12/2023 - results reviewed.  We have previously discussed medication use and possible role of marijuana in exacerbating her headaches and nausea (the latter is under better control lately). Stress exacerbate these.   Needs to schedule neuro-ophtho followup.    COVID-19  Discussed again today. Counseled re the CDC's current recommendations to access updated Pfizer-BioNTech or Moderna COVID-19 vaccine to protect against sequelae of COVID-19, including hospitalization and death.   She received one dose in 10/2020. I have reiterated my strong recommendation that she access this in the fall.     Mental Health  Symptoms are stable, exacerbated by stress. Feels need is less acute as kids are doing well and work is going well. Overall doing well and feels safe.   Discussed     Sexual health & secondary prevention  Sex with men. Monogamous with single partner. She does disclose status. Never uses condoms, we have discussed U=U. Has BTL.    Lab Results   Component Value Date    RPR Non Reactive 11/21/2023    LABRPR Nonreactive 06/12/2020    CTNAA Negative 12/15/2022    CTNAA Negative 05/24/2022    CTNAA Negative 05/24/2022    GCNAA Negative 12/15/2022    GCNAA Negative 05/24/2022    GCNAA Negative 05/24/2022    SPECTYPE Urine 10/28/2020    SPECTYPE Swab 10/28/2020    SPECTYPE Swab 10/28/2020    SPECSOURCE Urine 10/28/2020    SPECSOURCE Throat 10/28/2020    SPECSOURCE Rectum 10/28/2020     GC/CT NAATs -- Negative 12/2022   RPR -- NR 11/2023 - repeat 1Y      Health maintenance  Lab Results   Component Value Date    CREATININE 0.95 11/21/2023    QFTTBGOLD NEGATIVE 02/13/2014    HEPCAB Nonreactive 10/28/2020    CHOL 135 11/21/2023    HDL 45 11/21/2023    LDL 76 11/21/2023    NONHDL 90 11/21/2023    TRIG 66 11/21/2023    A1C 5.4 11/21/2023    PAP Negative for intraephithelial lesion or malignancy 03/29/2017    FINALDX  03/29/2019  A: Placenta, delivery    - Preterm (36 weeks 1 days EGA) singleton third trimester placenta, weight 320 g (weight 10th percentile for EGA)   - No acute chorioamnionitis identified   - Three vessel umbilical cord with narrow diameter (0.9 cm) and a furcate insertion  - Placental plate: accelerated maturation of chorionic villi,  increased intervillous and subchorionic fibrin, focal fetal thrombotic vasculopathy, small placental infarction (1.2 cm)          This electronic signature is attestation that the pathologist personally reviewed the submitted material(s) and the final diagnosis reflects that evaluation.       Health maintenance  - chronic, stable  Oral health  She does  have a dentist. Last dental exam within past year.    Eye health  She does  use corrective lenses. Last eye exam >1 year. She saw a local optometrist but has not followed up with ophthalmology. Reminded to do this 04/17/24    Metabolic conditions  Wt Readings from Last 5 Encounters:   12/13/23 66 kg (145 lb 8.1 oz)   06/28/23 67.6 kg (149 lb)   02/22/23 73.9 kg (163 lb)   01/18/23 74.6 kg (164 lb 6.4 oz)   08/17/22 78.3 kg (172 lb 9.6 oz)     Lab Results   Component Value Date    CREATININE 0.95 11/21/2023    PROTEINUA Negative 02/19/2020    PROTEINUR <4.0 03/29/2019    GLUCOSEU Negative 02/19/2020    PCRATIOUR  03/29/2019      Comment:      Unable to calculate due to value below lower limit of assay linearity.    GLU 101 02/22/2023    A1C 5.4 11/21/2023    ALT 9 11/21/2023    ALT <7 (L) 02/22/2023    ALT 11 12/15/2022     # Kidney health - urine studies needed but deferred to future visit  # Bone health - assessment not yet needed (under age 53)  # Diabetes assessment - Recent A1c WNL (11/2023)  # NAFLD assessment - monitor over time    Communicable diseases  Lab Results   Component Value Date    QFTTBGOLD NEGATIVE 02/13/2014    HEPAIGG Nonreactive 02/13/2014    HEPBSAB Reactive 02/13/2014    HEPCAB Nonreactive 10/28/2020    RUBIG Negative 05/30/2018    VZVIGG Positive 05/30/2018     # TB screening - no longer needed; negative IGRA, low risk  # Hepatitis screening - repeat HCV screen periodically  # MMR screening - Rubella nonimmune 2019 - got MMR x 1 2020, needs recheck     Cancer screening  Lab Results   Component Value Date    PAP Negative for intraephithelial lesion or malignancy 03/29/2017    FINALDX  03/29/2019     A: Placenta, delivery    - Preterm (36 weeks 1 days EGA) singleton third trimester placenta, weight 320 g (weight 10th percentile for EGA)   - No acute chorioamnionitis identified   - Three vessel umbilical cord with narrow diameter (0.9 cm) and a furcate insertion  - Placental plate: accelerated maturation of chorionic villi,  increased intervillous and subchorionic fibrin, focal fetal thrombotic vasculopathy, small placental infarction (1.2 cm)          This electronic signature is attestation that the pathologist personally reviewed the submitted material(s) and the final diagnosis reflects that evaluation.       # Cervical - repeat Pap 12 months from prior - performed 11/2023 (insufficient  cellularity but negative HPV)  # Breast - no indication for screening at present    # Anorectal - not yet done  # Colorectal - screening not indicated  # Liver - no screening indicated  # Lung - screening not indicated    Cardiovascular disease  Lab Results   Component Value Date    CHOL 135 11/21/2023    HDL 45 11/21/2023    LDL 76 11/21/2023    NONHDL 90 11/21/2023    TRIG 66 11/21/2023     # The ASCVD Risk score (Arnett DK, et al., 2019) failed to calculate.  - is not taking aspirin   - is not taking statin  - BP control excellent  - former smoker  # AAA screening - no indication for screening    Immunization History   Administered Date(s) Administered    COVID-19 VACC,MRNA,(PFIZER)(PF) 10/28/2020    Influenza Vaccine Quad(IM)6 MO-Adult(PF) 06/30/2015, 06/15/2016, 07/04/2018, 10/28/2020, 08/18/2021    MMR 03/31/2019    PNEUMOCOCCAL POLYSACCHARIDE 23-VALENT 03/31/2019    TdaP 02/05/2019     Immunizations today - Discussed need for bivalent COVID booster and patient declines recommended immunization(s). Advised to get PCV21.    I personally spent 31 minutes face-to-face and non-face-to-face in the care of this patient, which includes all pre, intra, and post visit time on the date of service.  All documented time was specific to the E/M visit and does not include any procedures that may have been performed.    Disposition  Return to clinic 5-6 months or sooner if needed.    Estefana Gum, MD, MPH   [facility] Infectious Diseases Clinic at [facility]  [address]   [place], [place] [address]  Phone: [phone]   Fax: [phone]     Subjective:      Chief Complaint   HIV followup    HPI  Return patient visit for JORI FRERICHS, a 38 y.o. woman with well-controlled HIV, pseudotumor cerebri with associated headaches.  Last visit - had URI sx and then research MRA/V showed sinusitis. No current symptoms - resolved.   Headache symptoms are stable.   Living separately from husband Oneil. Separated but amicably. Looking for a new job.  Youngest daughter Melita, born 03/2019) is doing great and loves school. Older daughter Susann) is as well, has custody of niece Marjie) who is now doing well (older sister of niece who died, mom released from jail). Older girls are in 11th and 12th grades (2024-25), both want to go to college. Kaniia is taking pre-college classes after graduation, Nyla is considering the [employer].   No more cigarettes, only marijuana (smokes regularly/daily)  Only one COVID vaccine so far.    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; reduced carrier risk [x]  Hemoglobinopathy screening - normal adult hemoglobin  WGS study eligible: no  GENETIC COUNSELING PREVISIT SUMMARY Referring provider: [facility] MFM  Indication: Aneuploidy screening Oth    Environmental allergies     Gastroesophageal reflux disease 06/07/2022    HIV (human immunodeficiency virus infection)       HIV (human immunodeficiency virus infection)  IIH (idiopathic intracranial hypertension)     IUD (intrauterine device) in place     Lymphadenopathy 02/11/2014    Migraine     daily; uses marijuana and tylenol  for management; pseudotumor cerebri    Obesity     Pseudotumor cerebri     Rubella non-immune status, antepartum (HHS-HCC) 01/14/2019    [ ]  PP MMR    STD (sexually transmitted disease)     Supervision of high risk pregnancy, unspecified, unspecified trimester (HHS-HCC) 05/30/2018    Dating: 9/21 (Unsure of LMP as had a D&C for a missed AB in 06/2018 and discovered she was pregnant in 07/2018)                                    Prenatal Screening:  [x]  Prenatal labs reviewed [x]  SMA 2 copies/Hgb electropheresis wnl/CF [ ]   [x]  1 hr GTT= 250 but done 3 days after BMZ (4/22 and 4/23) --> Repeat 1 hr GTT  5-18= 229  [ ]  28 wk CBC/RPR [ ]  GBS   Fetal Screening:  [x]  aneuploidy scre    Threatened premature labor in third trimester (HHS-HCC) 01/31/2019    Received BMZ 4/22 ([place])-4/23 [facility]) for concern for PTL. SVE cl/th/hi.    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 See bottom of this visit's encounter summary for details.  Current Outpatient Medications on File Prior to Visit   Medication Sig    bictegrav-emtricit-tenofov ala (BIKTARVY ) 50-200-25 mg tablet Take 1 tablet by mouth daily.    tizanidine  (ZANAFLEX ) 2 MG tablet Take 2 tablets (4 mg total) by mouth nightly.    topiramate  (TOPAMAX ) 50 MG tablet Take 2 tablets (100 mg total) by mouth nightly.     No current facility-administered medications on file prior to visit.     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     Social History  General - lives in [place] with her husband and her 2 daughters (born 2008, 2020). Also has custody of niece (2007).   Mertha and his girlfriend are there sometimes as well.   2021 - Family trauma, her 12yo niece committed suicide and her aunt passed away from cancer.   Mom and sister are both positive (sister is perinatally infected, patient with presumed sexual transmission from female partner).  Works at home for [employer], dreams of becoming a Clinical research associate or a Therapist, music. Loves vampire shows like True Blood.   Sexual History - sex with men (husband only)  Substance Use - marijuana (smokes nightly), smoked maybe 3 tobacco cigarettes a day but quit in 2022.  Social History     Tobacco Use    Smoking status: Former     Current packs/day: 0.50     Average packs/day: 0.5 packs/day for 5.0 years (2.5 ttl pk-yrs)     Types: Cigarettes    Smokeless tobacco: Never   Substance Use Topics    Alcohol use: Yes     Alcohol/week: 4.0 standard drinks of alcohol     Types: 1 Glasses of wine, 1 Shots of liquor, 2 Standard drinks or equivalent per week     Comment:  rare     Review of Systems  As per HPI. Remainder of 10 systems reviewed, negative.      Objective:      There were no vitals taken for this visit.  Wt Readings from Last 3 Encounters:   12/13/23 66 kg (145 lb 8.1 oz)   06/28/23 67.6 kg (149 lb)   02/22/23 73.9 kg (163 lb)     Video visit. Last exam below.   Today 04/17/24 nontoxic, animated and in a good mood.    Const looks well and attentive, alert, appropriate   Eyes sclerae anicteric, noninjected OU   ENT no thrush, leukoplakia or oral lesions   Lymph no cervical or supraclavicular LAD   CV RRR. No murmurs. No rub or gallop. S1/S2.   Lungs CTAB ant/post, normal work of breathing   GI Soft, no organomegaly. NTND. NABS.   GU deferred   Rectal deferred   Skin no petechiae, ecchymoses or rashes on full inspection.    MSK no joint tenderness and normal ROM throughout. No LE edema, feet warm with brisk DP/PT pulses.   Neuro CN II-XII grossly intact, MAEE, non focal   Psych Appropriate affect. Eye contact good. Linear thoughts. Fluent speech.     Laboratory Data  Reviewed in Epic today, using Synopsis and Chart Review filters.    Lab Results   Component Value Date    CREATININE 0.95 11/21/2023    QFTTBGOLD NEGATIVE 02/13/2014    HEPCAB Nonreactive 10/28/2020    CHOL 135 11/21/2023    HDL 45 11/21/2023    LDL 76 11/21/2023    NONHDL 90 11/21/2023    TRIG 66 11/21/2023    A1C 5.4 11/21/2023    PAP Negative for intraephithelial lesion or malignancy 03/29/2017 FINALDX  03/29/2019     A: Placenta, delivery    - Preterm (36 weeks 1 days EGA) singleton third trimester placenta, weight 320 g (weight 10th percentile for EGA)   - No acute chorioamnionitis identified   - Three vessel umbilical cord with narrow diameter (0.9 cm) and a furcate insertion  - Placental plate: accelerated maturation of chorionic villi,  increased intervillous and subchorionic fibrin, focal fetal thrombotic vasculopathy, small placental infarction (1.2 cm)          This electronic signature is attestation that the pathologist personally reviewed the submitted material(s) and the final diagnosis reflects that evaluation.         Radiology  Research MRI/MRA Interpretation  12/26/23  TONICA BRASINGTON (1985-12-15, 38 y.o.)     This individual is enrolled in the MACS/WIHS Combined Cohort Study. As part of this study, the individual enrolled in a brain imaging protocol to assess cerebrovascular injury and immune activation. The participant underwent a research MRI/MRA that included measures of vasoreactivity, large vessel disease, and small vessel injury on 12/13/2023. Imaging was conducted at the [facility] at the [place] [facility] [place]  at [facility] on 3T MR scanners using a standard multi-channel phased array head coil.     A member of the research team who is a neuroradiologist has reviewed the research images and identified the following findings: .     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     -Tiny enhancing lesions in the right Meckel's cave. 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      Because this is a research assessment, we recommend that any findings that may be potentially clinically significant be further evaluated in the  clinical setting. Please note that the research images are not stored on the [facility] medical record system.     The recommendation from our research team is  to consider an ENT referral to evaluate chronic sinusitis and a repeat MRI Wo/W contrast with a dedicated skull base protocol to further evaluate the lesions in the right Meckel's cave.     The participant was contacted on 12/26/2023 and results reviewed. Additionally, we have notified her PCP and neurologist.        _____________________________________________________________________

## 2024-04-16 NOTE — Unmapped (Addendum)
 It was great to see you today.    - You should get the updated pneumococcal vaccine (Capvaxive, or PCV21). You can get this at a drugstore or in our clinic. It protects against serious illness from bacterial pneumonia. You have had other vaccines in this category before.  - Please get labs done when you are next in [facility] (or at a [facility] facility)  - Please call [phone] to make an appointment with Myrna Velia Canales, MD  - Psychology Today - find a therapist: Find Marriage Counseling Therapists and Psychologists in [place]  - Psychology Today - please let me know if you need other resources.   - I made a referral to ophthalmology. This is specifically for the peripheral vision issues. [facility] MARANDA HENSEN [place]  [phone]    The ID clinic phone number is [phone].  The ID clinic fax number is [phone].    Please note that your laboratory and other results may be visible to you in real time, possibly before they reach your provider. Please allow 48 hours for clinical interpretation of these results. Importantly, even if a result is flagged as abnormal, it may not be one that impacts your health.    For urgent issues on nights and weekends you may reach the ID Physician on call through the [facility] Operator at [phone].     URGENT CARE  Please call ahead to speak with the nursing staff if you are in need of an urgent appointment.       MEDICATIONS  For refills please contact your pharmacy and ask them to electronically send or fax the request to the clinic.   Please bring all medications in original bottles to every appointment.    HMAP (formerly ADAP) or [employer] Eligibility (required even if you do not receive medication through [facility])  Please remember to renew your [employer] eligibility during renewal periods which occur twice a year: January-March and July-September.     The following are needed for each renewal:   - [place]  Identification (if you don't have one, then a bill with your name and address in [place] )   - proof of income (award letter, W-2, or last three check stubs)   If you are unable to come in for renewal, let us  know if we can mail, fax or e-mail paperwork to you.   HMAP Contact: [phone].     Estefana FORBES Gum, MD  [facility] Infectious Diseases Clinic at [facility]  [address]   [place], [place] [address]  Phone: [phone]   Fax: [phone]     Lab info:  Your most recent CD4 T-cell counts and viral loads are below. Here are a few things to keep in mind when looking at your numbers:  Our goal is to get your virus to be undetectable and keep it undetectable. If the virus is undetectable you are much more likely to stay healthy.   We consider your viral load to be undetectable if it says <20 or if it says Not detected.   For most people, we're checking CD4 counts every other visit (once or twice a year, or sometimes even less).  It's normal for your CD4 count to be different from visit to visit.   You can help by taking your medications at about the same time, every single day. If you're having trouble with taking your medications, it's important to let us  know.    Lab Results   Component Value Date  ACD4 1,118 10/22/2018    CD4 54 10/22/2018    HIVCP <20 06/24/2020    HIVRS Not Detected 08/18/2021

## 2024-04-17 DIAGNOSIS — H53453 Other localized visual field defect, bilateral: Principal | ICD-10-CM

## 2024-04-17 DIAGNOSIS — G932 Benign intracranial hypertension: Principal | ICD-10-CM

## 2024-04-17 DIAGNOSIS — Z5181 Encounter for therapeutic drug level monitoring: Principal | ICD-10-CM

## 2024-04-17 DIAGNOSIS — Z0184 Encounter for antibody response examination: Principal | ICD-10-CM

## 2024-04-17 DIAGNOSIS — Z79899 Other long term (current) drug therapy: Principal | ICD-10-CM

## 2024-04-17 DIAGNOSIS — Z9189 Other specified personal risk factors, not elsewhere classified: Principal | ICD-10-CM

## 2024-04-17 DIAGNOSIS — Z23 Encounter for immunization: Principal | ICD-10-CM

## 2024-04-17 DIAGNOSIS — B20 Human immunodeficiency virus [HIV] disease: Principal | ICD-10-CM

## 2024-04-17 MED ORDER — BIKTARVY 50 MG-200 MG-25 MG TABLET
ORAL_TABLET | Freq: Every day | ORAL | 3 refills | 90.00000 days | Status: CP
Start: 2024-04-17 — End: 2025-04-17
  Filled 2024-04-16: qty 90, 90d supply, fill #1
  Filled 2024-07-09: qty 90, 90d supply, fill #0

## 2024-04-17 NOTE — Unmapped (Signed)
 [place] [facility] Checklist     Type of visit:  video    Are you located in [place]? yes    Reason for visit: follow up    Questions / Concerns that need to be addressed: no    General Consent to Treat (GCT) for epic video visits only: Verbal consent    HCDM reviewed and updated in Epic:    We are working to make sure all of our patients??? wishes are updated in Epic and part of that is documenting a Environmental health practitioner for each patient  A Health Care Decision Maker is someone you choose who can make health care decisions for you if you are not able - who would you most want to do this for you????  is already up to date.    HCDM (patient stated preference): Morgan Hebert - Spouse - [phone]    HCDM (patient stated preference): Morgan Hebert, Morgan Hebert - Sister - [phone]    COVID-19 Vaccine Summary  Which COVID-19 Vaccine was administered  Pfizer  Type:  Dates Given:                   If no: Are you interested in scheduling? Declines vaccine

## 2024-05-12 LAB — HIV-1, VIRAL LOAD DETERMINED BY PCR (87536)
HIV-1 RNA BY PCR: 20 {copies}/mL
LOG10 HIV-1 RNA: 1.301 {Log_copies}/mL

## 2024-05-14 NOTE — Unmapped (Signed)
 Research Sleep Study Interpretation    05/14/24  Morgan Hebert (01/20/1986, 38 y.o.)    This patient is enrolled in the [facility] MACS-WIHS Combined Cohort Study [facility], IRB [phone]). As part of this study, the individual completed a home sleep study, which included (polysomnography) and wrist-worn actigraphy, on 01/29/2024.     These assessments included monitoring of oxygen saturation, heart rate, breathing pattern, sleep stages, and estimated sleep duration and quality. A full report has been uploaded to the Media tab in Epic.    These assessments were performed as part of the [facility] research study and are not part of the participant???s routine clinical care. Interpretation was provided by study-affiliated sleep medicine physicians.    Key findings include     Apnea-Hypopnea Index (AHI): 4 events/hr   Sleep efficiency:95.3 %   REM sleep: 22.8 %   Periodic Limb Movement Index (PLMI): 0    The participant was contacted on 05/14/2024 and results reviewed. Additionally, we have forwarded these results to her provider. Because this testing was performed in the context of research, findings that may be clinically significant should be further evaluated in a healthcare setting . If any abnormalities are noted, participants are encouraged to discuss these results with their healthcare provider to determine whether clinical evaluation or further diagnostic testing is needed.    Inquiries regarding these results can be directed to the contacts listed below.    Study Clinician  Lonell Eagles via EPIC or at [email or web address]    Study Cardiologist  Dale Pennant via EPIC or at [email or web address]    Study Principal Investigators    Morgan Hebert via EPIC or at [email or web address]   Morgan Hebert via EPIC or at [email or web address]

## 2024-05-16 ENCOUNTER — Emergency Department
Admit: 2024-05-16 | Discharge: 2024-05-17 | Disposition: A | Discharge status: Home / Self Care | Payer: Medicaid (Managed Care)

## 2024-05-16 DIAGNOSIS — R079 Chest pain, unspecified: Principal | ICD-10-CM

## 2024-05-16 LAB — COMPREHENSIVE METABOLIC PANEL
ALBUMIN: 4.3 g/dL (ref 3.4–5.0)
ALKALINE PHOSPHATASE: 57 U/L (ref 46–116)
ALT (SGPT): 9 U/L — ABNORMAL LOW (ref 10–49)
ANION GAP: 14 mmol/L (ref 5–14)
AST (SGOT): 18 U/L (ref <=34)
BILIRUBIN TOTAL: 0.2 mg/dL — ABNORMAL LOW (ref 0.3–1.2)
BLOOD UREA NITROGEN: 7 mg/dL — ABNORMAL LOW (ref 9–23)
BUN / CREAT RATIO: 7
CALCIUM: 9.6 mg/dL (ref 8.7–10.4)
CHLORIDE: 110 mmol/L — ABNORMAL HIGH (ref 98–107)
CO2: 22.9 mmol/L (ref 20.0–31.0)
CREATININE: 0.95 mg/dL (ref 0.55–1.02)
EGFR CKD-EPI (2021) FEMALE: 79 mL/min/1.73m2 (ref >=60)
GLUCOSE RANDOM: 89 mg/dL (ref 70–179)
POTASSIUM: 4.3 mmol/L (ref 3.4–4.8)
PROTEIN TOTAL: 7.7 g/dL (ref 5.7–8.2)
SODIUM: 147 mmol/L — ABNORMAL HIGH (ref 135–145)

## 2024-05-16 LAB — PROTIME-INR
INR: 1.03
PROTIME: 11.7 s (ref 9.9–12.6)

## 2024-05-16 LAB — HIGH SENSITIVITY TROPONIN I - 2 HOUR SERIAL
HIGH SENSITIVITY TROPONIN - DELTA (0-2H): 0 ng/L (ref <=7)
HIGH-SENSITIVITY TROPONIN I - 2 HOUR: <3 ng/L (ref <=34)

## 2024-05-16 LAB — CBC W/ AUTO DIFF
BASOPHILS ABSOLUTE COUNT: 0.1 10*9/L (ref 0.0–0.1)
BASOPHILS RELATIVE PERCENT: 0.8 %
EOSINOPHILS ABSOLUTE COUNT: 0.3 10*9/L (ref 0.0–0.5)
EOSINOPHILS RELATIVE PERCENT: 3.8 %
HEMATOCRIT: 39.1 % (ref 34.0–44.0)
HEMOGLOBIN: 13.1 g/dL (ref 11.3–14.9)
LYMPHOCYTES ABSOLUTE COUNT: 3.6 10*9/L (ref 1.1–3.6)
LYMPHOCYTES RELATIVE PERCENT: 44.7 %
MEAN CORPUSCULAR HEMOGLOBIN CONC: 33.4 g/dL (ref 32.0–36.0)
MEAN CORPUSCULAR HEMOGLOBIN: 29.7 pg (ref 25.9–32.4)
MEAN CORPUSCULAR VOLUME: 88.8 fL (ref 77.6–95.7)
MEAN PLATELET VOLUME: 8.5 fL (ref 6.8–10.7)
MONOCYTES ABSOLUTE COUNT: 0.5 10*9/L (ref 0.3–0.8)
MONOCYTES RELATIVE PERCENT: 5.9 %
NEUTROPHILS ABSOLUTE COUNT: 3.6 10*9/L (ref 1.8–7.8)
NEUTROPHILS RELATIVE PERCENT: 44.8 %
NUCLEATED RED BLOOD CELLS: 0 /100{WBCs} (ref <=4)
PLATELET COUNT: 272 10*9/L (ref 150–450)
RED BLOOD CELL COUNT: 4.4 10*12/L (ref 3.95–5.13)
RED CELL DISTRIBUTION WIDTH: 15 % (ref 12.2–15.2)
WBC ADJUSTED: 8 10*9/L (ref 3.6–11.2)

## 2024-05-16 LAB — HIGH SENSITIVITY TROPONIN I - SERIAL: HIGH SENSITIVITY TROPONIN I: <3 ng/L (ref <=34)

## 2024-05-16 LAB — PREGNANCY, URINE: PREGNANCY TEST URINE: NEGATIVE

## 2024-05-16 NOTE — Unmapped (Signed)
 [facility]  Emergency Department Provider Note     ED Clinical Impression     Final diagnoses:   Chest pain, unspecified type (Primary)      Impression, Medical Decision Making, ED Course     Impression: 38 y.o. female with PMH most significant for HIV positivity, GERD, IIH, and migraines who presents with several episodes of chest pain and pressure lasting a few seconds at a time as described below. Initial vitals within normal limits. Physical exam relatively benign.    DDx/MDM: ACS, reflux, chest wall pain, pneumothorax, etc.    Diagnostic workup as below. Will treat patient with 324 mg aspirin.    Orders Placed This Encounter   Procedures    XR Chest 2 views    CBC w/ Differential    Comprehensive Metabolic Panel    PT-INR    hsTroponin I (serial 0-2-6H w/ delta)    Pregnancy Qualitative, Urine    hsTroponin I - 2 Hour    hsTroponin I - 6 Hour    ECG 12 Lead    ECG 12 Lead    ECG 12 Lead       ED Course as of 05/16/24 2320   Thu May 16, 2024   2318 Repeat troponin is negative.  Will discharge home.       MDM Elements    Independent Interpretation of Studies: EKG(s) - normal sinus rhythm without any ST or T wave abnormalities.  and X-ray(s) - clear by my independent interpretation.  I have reviewed recent and relevant previous record, including: Outpatient notes - 02/22/23 [facility] Neurology Office Visit for review of past medical history    Social Determinants that significantly affected care: N/A    Diagnostic tests considered but not performed: CT of chest.  PERC score is negative.  ____________________________________________     History     Chief Complaint  Chief Complaint   Patient presents with    Chest Pain       HPI   Morgan Hebert is a 38 y.o. female with past medical history as below who presents with chest pain. The patient reports intermittent episodes of chest pain starting earlier today. She describes these episodes as pressure in her chest with intermittent burst of sharp pain lasting a few seconds. She states her job is mildly stressful but that she works from home and has minimal physical exertion throughout the day. She denies any recent respiratory illnesses, emesis, nausea, or fevers.    Outside Historian(s): I have obtained additional history/collateral from no one.    Past Medical History[1]    Past Surgical History[2]    Active Medications[3]     Allergies[4]    Family History[5]    Short Social History[6]     Physical Exam     VITAL SIGNS:      Vitals:    05/16/24 2002   BP: 109/73   Pulse: 71   Resp: 16   Temp: 36.9 ??C (98.4 ??F)   TempSrc: Oral   SpO2: 100%   Weight: 63.5 kg (140 lb)   Height: 149.9 cm (4' 11)       Constitutional: Alert and oriented. No acute distress.  Eyes: Conjunctivae are normal.  HEENT: Normocephalic and atraumatic. Conjunctivae clear. No congestion. Moist mucous membranes.   Cardiovascular: Rate as above, regular rhythm. Normal and symmetric distal pulses. Brisk capillary refill. Normal skin turgor.  Respiratory: Normal respiratory effort. Breath sounds are normal. There are no wheezing or crackles heard.  Gastrointestinal: Soft, non-distended, non-tender.  Genitourinary: Deferred.  Musculoskeletal: Non-tender with normal range of motion in all extremities.  Neurologic: Normal speech and language. No gross focal neurologic deficits are appreciated. Patient is moving all extremities equally, face is symmetric at rest and with speech.  Skin: Skin is warm, dry and intact. No rash noted.  Psychiatric: Mood and affect are normal. Speech and behavior are normal.     Radiology     XR Chest 2 views   Final Result   No acute cardiopulmonary abnormality.                   Pertinent labs & imaging results that were available during my care of the patient were independently interpreted by me and considered in my medical decision making (see chart for details).    Portions of this record have been created using Scientist, clinical (histocompatibility and immunogenetics). Dictation errors have been sought, but may not have been identified and corrected.    Documentation assistance was provided by Brad Cunning, Scribe on May 16, 2024 at 9:36 PM for Morene Sa, FNP.      May 16, 2024 11:20 PM. Documentation assistance provided by the scribe. I was present during the time the encounter was recorded. The information recorded by the scribe was done at my direction and has been reviewed and validated by me.            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; reduced carrier risk [x]  Hemoglobinopathy screening - normal adult hemoglobin  WGS study eligible: no  GENETIC COUNSELING PREVISIT SUMMARY Referring provider: [facility] MFM  Indication: Aneuploidy screening Oth    Environmental allergies     Gastroesophageal reflux disease 06/07/2022    HIV (human immunodeficiency virus infection)       HIV (human immunodeficiency virus infection)       IIH (idiopathic intracranial hypertension)     IUD (intrauterine device) in place     Lymphadenopathy 02/11/2014    Migraine     daily; uses marijuana and tylenol  for management; pseudotumor cerebri    Obesity     Pseudotumor cerebri     Rubella non-immune status, antepartum (HHS-HCC) 01/14/2019    [ ]  PP MMR    STD (sexually transmitted disease)     Supervision of high risk pregnancy, unspecified, unspecified trimester (HHS-HCC) 05/30/2018    Dating: 9/21 (Unsure of LMP as had a D&C for a missed AB in 06/2018 and discovered she was pregnant in 07/2018)                                    Prenatal Screening:  [x]  Prenatal labs reviewed [x]  SMA 2 copies/Hgb electropheresis wnl/CF [ ]   [x]  1 hr GTT= 250 but done 3 days after BMZ (4/22 and 4/23) --> Repeat 1 hr GTT  5-18= 229  [ ]  28 wk CBC/RPR [ ]  GBS   Fetal Screening:  [x]  aneuploidy scre    Threatened premature labor in third trimester (HHS-HCC) 01/31/2019  Received BMZ 4/22 ([place]4/23 [facility]) for concern for PTL. SVE cl/th/hi.    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, symptomatic sometimes    Tooth sensitivity     ULQ to sugar   [2]   Past Surgical History:  Procedure Laterality Date    DILATION AND CURETTAGE OF UTERUS      LUMBAR PUNCTURE      PR CESAREAN DELIVERY ONLY N/A 03/29/2019    Procedure: CESAREAN DELIVERY ONLY;  Surgeon: Arlean Almarie Devora Roger, MD;  Location: L&D C-SECTION OR SUITES [facility];  Service: Maternal-Fetal Medicine    PR COLONOSCOPY W/BIOPSY SINGLE/MULTIPLE  07/03/2014    Procedure: COLONOSCOPY, FLEXIBLE, PROXIMAL TO SPLENIC FLEXURE; WITH BIOPSY, SINGLE OR MULTIPLE;  Surgeon: Thedora JONETTA Plain, MD;  Location: GI PROCEDURES [facility] [facility];  Service: Gastroenterology    PR DILATION/CURETTAGE,DIAGNOSTIC N/A 06/14/2018    Procedure: DILATION AND CURETTAGE, DIAGNOSTIC AND/OR THERAPEUTIC (NON OBSTETRICAL);  Surgeon: Hadassah Lela Buba, MD;  Location: [facility] OR [facility];  Service: [facility] Primary Gynecology    PR LAP,RMV  ADNEXAL STRUCTURE Bilateral 06/04/2019    Procedure: R21  LAPAROSCOPY, SURGICAL; W/REMOVAL OF ADNEXAL STRUCTURES (PARTIAL OR TOTAL OOPHORECTOMY &/OR SALPINGECTOMY);  Surgeon: Greig Ronnald Public, MD;  Location: [facility] OR [facility];  Service: Family Planning    PR SIGMOIDOSCOPY FLX DX W/COLLJ SPEC BR/WA IF PFRMD N/A 06/25/2020    Procedure: SIGMOIDOSCOPY, FLEXIBLE; DIAGNOSTIC, WITH OR WITHOUT COLLECTION OF SPECIMEN(S) BY BRUSHING OR WASHING;  Surgeon: Arlean Obadiah Spitz, MD;  Location: [facility] GI PROCEDURES [facility];  Service: Gastroenterology    PR UPPER GI ENDOSCOPY,BIOPSY N/A 07/03/2014    Procedure: UGI ENDOSCOPY; WITH BIOPSY, SINGLE OR MULTIPLE;  Surgeon: Thedora JONETTA Plain, MD;  Location: GI PROCEDURES [facility] [facility];  Service: Gastroenterology    WISDOM TOOTH EXTRACTION     [3]   Current Facility-Administered Medications   Medication Dose Route Frequency Provider Last Rate Last Admin    aspirin chewable tablet 324 mg  324 mg Oral Once Billy Hoy Cutting, MD         Current Outpatient Medications   Medication Sig Dispense Refill    bictegrav-emtricit-tenofov ala (BIKTARVY ) 50-200-25 mg tablet Take 1 tablet by mouth daily. 90 tablet 3    tizanidine  (ZANAFLEX ) 2 MG tablet Take 2 tablets (4 mg total) by mouth nightly. 60 tablet 5    topiramate  (TOPAMAX ) 50 MG tablet Take 2 tablets (100 mg total) by mouth nightly. 180 tablet 1   [4]   Allergies  Allergen Reactions    Peanut Other (See Comments)     Patient allergic to walnuts, cashews, pistachios and peanuts in excess.   [5]   Family History  Problem Relation Age of Onset    Cervical cancer Mother     Diabetes Mother     Asthma Mother     Hypertension Mother     Diabetes Maternal Aunt     Hypertension Maternal Aunt     Breast cancer Maternal Grandmother     Cancer Maternal Grandmother     No Known Problems Daughter     Glaucoma Neg Hx    [6]   Social History  Tobacco Use    Smoking status: Former     Current packs/day: 0.50     Average packs/day: 0.5 packs/day for 5.0 years (2.5 ttl pk-yrs)     Types: Cigarettes    Smokeless tobacco: Never   Vaping Use    Vaping status: Never Used   Substance Use Topics  Alcohol use: Yes     Alcohol/week: 4.0 standard drinks of alcohol     Types: 1 Glasses of wine, 1 Shots of liquor, 2 Standard drinks or equivalent per week     Comment: rare    Drug use: Yes     Frequency: 35.0 times per week     Types: Marijuana     Comment: depends on the week for migraines        Kriss Morene Lung, FNP  05/16/24 2320

## 2024-05-16 NOTE — Unmapped (Signed)
 Pt with chest tightness that began this morning and has progressed over course of the day. Pt also reports intermittent sharp pains lasting only a second or two at a time. Pt states her watch has shown her heart rate too high at times during the day.

## 2024-05-22 ENCOUNTER — Ambulatory Visit: Admit: 2024-05-22 | Discharge: 2024-05-23 | Payer: Medicaid (Managed Care)

## 2024-05-22 DIAGNOSIS — Z5181 Encounter for therapeutic drug level monitoring: Principal | ICD-10-CM

## 2024-05-22 DIAGNOSIS — K219 Gastro-esophageal reflux disease without esophagitis: Principal | ICD-10-CM

## 2024-05-22 DIAGNOSIS — Z79899 Other long term (current) drug therapy: Principal | ICD-10-CM

## 2024-05-22 DIAGNOSIS — B20 Human immunodeficiency virus [HIV] disease: Principal | ICD-10-CM

## 2024-05-22 DIAGNOSIS — Z0184 Encounter for antibody response examination: Principal | ICD-10-CM

## 2024-05-22 DIAGNOSIS — Z9189 Other specified personal risk factors, not elsewhere classified: Principal | ICD-10-CM

## 2024-05-22 LAB — BASIC METABOLIC PANEL
ANION GAP: 6 mmol/L (ref 5–14)
BLOOD UREA NITROGEN: 8 mg/dL — ABNORMAL LOW (ref 9–23)
BUN / CREAT RATIO: 9
CALCIUM: 9.9 mg/dL (ref 8.7–10.4)
CHLORIDE: 109 mmol/L — ABNORMAL HIGH (ref 98–107)
CO2: 29.1 mmol/L (ref 20.0–31.0)
CREATININE: 0.91 mg/dL (ref 0.55–1.02)
EGFR CKD-EPI (2021) FEMALE: 83 mL/min/1.73m2 (ref >=60)
GLUCOSE RANDOM: 82 mg/dL (ref 70–99)
POTASSIUM: 4.2 mmol/L (ref 3.4–4.8)
SODIUM: 144 mmol/L (ref 135–145)

## 2024-05-22 LAB — CBC W/ AUTO DIFF
BASOPHILS ABSOLUTE COUNT: 0.1 10*9/L (ref 0.0–0.1)
BASOPHILS RELATIVE PERCENT: 1.1 %
EOSINOPHILS ABSOLUTE COUNT: 0.2 10*9/L (ref 0.0–0.5)
EOSINOPHILS RELATIVE PERCENT: 2.6 %
HEMATOCRIT: 38.9 % (ref 34.0–44.0)
HEMOGLOBIN: 12.7 g/dL (ref 11.3–14.9)
LYMPHOCYTES ABSOLUTE COUNT: 2.7 10*9/L (ref 1.1–3.6)
LYMPHOCYTES RELATIVE PERCENT: 40.2 %
MEAN CORPUSCULAR HEMOGLOBIN CONC: 32.7 g/dL (ref 32.0–36.0)
MEAN CORPUSCULAR HEMOGLOBIN: 28.9 pg (ref 25.9–32.4)
MEAN CORPUSCULAR VOLUME: 88.5 fL (ref 77.6–95.7)
MEAN PLATELET VOLUME: 8.5 fL (ref 6.8–10.7)
MONOCYTES ABSOLUTE COUNT: 0.4 10*9/L (ref 0.3–0.8)
MONOCYTES RELATIVE PERCENT: 5.6 %
NEUTROPHILS ABSOLUTE COUNT: 3.4 10*9/L (ref 1.8–7.8)
NEUTROPHILS RELATIVE PERCENT: 50.5 %
PLATELET COUNT: 271 10*9/L (ref 150–450)
RED BLOOD CELL COUNT: 4.39 10*12/L (ref 3.95–5.13)
RED CELL DISTRIBUTION WIDTH: 14.7 % (ref 12.2–15.2)
WBC ADJUSTED: 6.8 10*9/L (ref 3.6–11.2)

## 2024-05-22 LAB — MEASLES (RUBEOLA) ANTIBODY IGG: MEASLES IGG ANTIBODY: POSITIVE

## 2024-05-22 LAB — MUMPS ANTIBODY, IGG: MUMPS IGG ANTIBODY: UNDETERMINED — AB

## 2024-05-22 LAB — HIV RNA, QUANTITATIVE, PCR
HIV RNA QNT RSLT: DETECTED — AB
HIV RNA: <20 {copies}/mL — ABNORMAL HIGH (ref <0)

## 2024-05-22 LAB — BILIRUBIN, TOTAL: BILIRUBIN TOTAL: 0.2 mg/dL — ABNORMAL LOW (ref 0.3–1.2)

## 2024-05-22 LAB — RUBELLA ANTIBODY, IGG: RUBELLA IGG SCREEN: POSITIVE

## 2024-05-22 LAB — AST: AST (SGOT): 15 U/L (ref <=34)

## 2024-05-22 LAB — ALT: ALT (SGPT): 15 U/L (ref 10–49)

## 2024-05-22 MED ORDER — OMEPRAZOLE 20 MG CAPSULE,DELAYED RELEASE
ORAL_CAPSULE | Freq: Every day | ORAL | 0 refills | 30.00000 days | Status: CN
Start: 2024-05-22 — End: 2024-06-21

## 2024-05-22 NOTE — Unmapped (Signed)
 It was nice to see you today    For your chest pain please start taking prilosec (omeprazole ) twice a day for two weeks. Take it first thing in the morning on an empty stomach when you take your morning dose. Please also consider eating breakfast with your morning coffee as well.

## 2024-05-22 NOTE — Unmapped (Signed)
 [facility]     Reason for visit: Follow up    Questions / Concerns that need to be addressed: ED follow up still having pin in chest    Screening BP- 126/87 P 73    Omron BPs (complete if screening BP has a systolic  > 130 or diastolic > 80)  BP#1    BP#2   BP#3     Average BP   (please note this as a comment in vitals)     PTHomeBP     Diabetes:  Regularly checking blood sugars?: no  If yes, when? Complete log for past 7 days  Date Before Breakfast After Breakfast Before Lunch After Lunch Before Dinner After Dinner Before Bed                                                                                                                                     Dexcom or Libre CGM in use? If so, pull appropriate reporting through portal (Dexcom) or EPIC order [place]).    HCDM reviewed and updated in Epic:    We are working to make sure all of our patients??? wishes are updated in Epic and part of that is documenting a Environmental health practitioner for each patient  A Health Care Decision Maker is someone you choose who can make health care decisions for you if you are not able - who would you most want to do this for you????  is already up to date.    HCDM (patient stated preference): Cleda Anes - Spouse - [phone]    HCDM (patient stated preference): Sheritha, Louis - Sister - [phone]    BPAs completed:  PHQ2    Annual Screenings:   Tobacco  __________________________________________________________________________________________    SCREENINGS COMPLETED IN FLOWSHEETS      AUDIT       PHQ2       PHQ9          GAD7       COPD Assessment       Falls Risk

## 2024-05-22 NOTE — Unmapped (Signed)
 Internal Medicine Clinic Visit    Reason for visit: ED Follow Up    A/P:         1. Gastroesophageal reflux disease, unspecified whether esophagitis present        Chest Discomfort - History of GERD  Morgan Hebert presents today for 1 week of intermittent chest discomfort.  She notes that on 8/7 she had developed sharp pain along her breastbone on the left side of her chest that has now been nearly daily occurrence since then.  She notes no exacerbation with exercise, no recent musculoskeletal trauma, or any recent infection producing significant cough.  EKG, troponin values, and chest x-ray all were normal at a recent ED visit for this concern.  We discussed today the differential for atypical chest pain.  She did note drinking a large cup of coffee every day with 2 shots of espresso in it in the morning and feels that this may play a role.  No palpitations associated with this but she does tend to often not eat breakfast. I have higher concern that this could be related to esophageal discomfort from GERD.  We discussed trialing a PPI for 1 month twice daily followed by 1 month once a day to see if this helps resolve her issues.  We also discussed trying to eat breakfast when she drinks a cup of coffee prior to work.  - Omeprazole  20 mg twice daily for 1 month followed by 20 mg daily for 1 month  - Encouraged consumption of food with coffee in the morning    Return in about 3 months (around 08/22/2024).    Staffed with Dr. Loralie, discussed    __________________________________________________________    HPI:    She notes last week she was at work and started having chest pain over the left side of her chest. The pain will last for 5 minutes. She will have some intermittent chest tightness. Since then she will have some ongoing issues with a few times a day. She notes she has had ongoing issues with some nausea and pain in her left arm. She has had other issues with sleep because of it. No recent exercise or injury that could have caused MSK.       She notes that she  does double shot of espresso and cappucino every morning and rarely eats breakfast. She had GERD during her pregnancies though this feels different.   __________________________________________________________        Medications:  Reviewed in EPIC  __________________________________________________________    Physical Exam:   Vital Signs:  Vitals:    05/22/24 1132   BP: 123/87   BP Site: L Arm   BP Position: Sitting   BP Cuff Size: Medium   Pulse: 73   Resp: 18   Temp: 36.7 ??C (98.1 ??F)   TempSrc: Temporal   Weight: 68 kg (150 lb)   Height: 149.9 cm (4' 11)          PTHomeBP    The patient???s Average Home Blood Pressure during the last two weeks is :   /   based on  readings            Gen: Well appearing, NAD  CV: RRR, no murmurs  Pulm: CTA bilaterally, no crackles or wheezes  Ext: No edema      PHQ-9 Score:     GAD-7 Score:       Medication adherence and barriers to the treatment plan have been addressed. Opportunities to optimize  healthy behaviors have been discussed. Patient / caregiver voiced understanding.

## 2024-05-23 NOTE — Unmapped (Signed)
 I saw and evaluated the patient, participating in the key portions of the service.  I reviewed the resident???s note.  I agree with the resident???s findings and plan. Garwin Brothers, MD

## 2024-06-22 MED ORDER — OMEPRAZOLE 20 MG CAPSULE,DELAYED RELEASE
ORAL_CAPSULE | Freq: Every day | ORAL | 0 refills | 30.00000 days | Status: CP
Start: 2024-06-22 — End: 2024-07-22

## 2024-07-03 MED ORDER — TOPIRAMATE 50 MG TABLET
ORAL_TABLET | Freq: Every evening | ORAL | 1 refills | 90.00000 days
Start: 2024-07-03 — End: 2024-12-30

## 2024-07-03 NOTE — Unmapped (Signed)
 Refill request received from patient.      Medication Requested: Topiramate  50 mg   Last Office Visit: 02/21/2023  Next Office Visit: Visit date not found  Last Prescriber: Dr. Lenny     Nurse refill requirements met? No  If not met, why: Allergy/Contraindication warning  Patient not seen in last 12 months   Sent to: Provider for signing  If sent to provider, which provider?: Dr. Earlyne

## 2024-07-03 NOTE — Unmapped (Signed)
 [facility] Refill Coordination Note    Specialty Medication(s) to be Shipped:   Infectious Disease: BIKTARVY  50-200-25 mg tablet (bictegrav-emtricit-tenofov ala)    Other medication(s) to be shipped: tizanidine  2 MG tablet (ZANAFLEX ) topiramate  50 MG tablet (TOPAMAX )    Specialty Medications not needed at this time: N/A     Morgan Hebert, DOB: [birth year]  Phone: [phone] (home) [phone] (work)      All above HIPAA information was verified with patient.     Was a Nurse, learning disability used for this call? No    Completed refill call assessment today to schedule patient's medication shipment from the [facility]  [phone]).  All relevant notes have been reviewed.     Specialty medication(s) and dose(s) confirmed: Regimen is correct and unchanged.   Changes to medications: Lilly reports no changes at this time.  Changes to insurance: No  New side effects reported not previously addressed with a pharmacist or physician: None reported  Questions for the pharmacist: No    Confirmed patient received a Conservation officer, historic buildings and a Surveyor, mining with first shipment. The patient will receive a drug information handout for each medication shipped and additional FDA Medication Guides as required.       DISEASE/MEDICATION-SPECIFIC INFORMATION        N/A    SPECIALTY MEDICATION ADHERENCE     Medication Adherence    Patient reported X missed doses in the last month: 0  Specialty Medication: BIKTARVY  50-200-25 mg tablet (bictegrav-emtricit-tenofov ala)  Patient is on additional specialty medications: No  Informant: patient              Were doses missed due to medication being on hold? No  BIKTARVY  50-200-25 mg tablet (bictegrav-emtricit-tenofov ala): about 10 days of medicine on hand       REFERRAL TO PHARMACIST     Referral to the pharmacist: Not needed      [facility]     Shipping address confirmed in Epic.     Cost and Payment: Patient has a copay of $8. They are aware and have authorized the pharmacy to charge the credit card on file.    Delivery Scheduled: Yes, Expected medication delivery date: 10/1.     Medication will be delivered via Next Day Courier to the prescription address in Epic WAM.    Morgan Hebert [facility]

## 2024-07-07 MED ORDER — TOPIRAMATE 50 MG TABLET
ORAL_TABLET | Freq: Every evening | ORAL | 0 refills | 90.00000 days | Status: CP
Start: 2024-07-07 — End: 2024-10-05
  Filled 2024-07-09: qty 180, 90d supply, fill #0

## 2024-07-07 NOTE — Unmapped (Signed)
 Resident follow up:     Please schedule patient for follow as follows:    Resident Clinic  Template: return neurology  Slot length: 30 minute slot (use return slot or urgent follow-up slot)  Provider: Dayna   Diagnosis: IIH  Time range:  between 2-4 months. Please do not schedule sooner.    Preferred day of week: Any  Please ensure scheduled PRIOR to appointment: N/A  If none of these dates are available, please reply to message sender for further instructions.    Please reply to message sender with an epic inbox message to confirm the appointment has been scheduled.

## 2024-07-08 NOTE — Unmapped (Signed)
-----   Message from Ratechia H sent at 07/08/2024 10:24 AM EDT -----  Regarding: RE: Hospital Follow-up  Hi,    I have contacted the patient and the appointment is scheduled.    Thanks!    Ratechia Hamlett  ----- Message -----  From: Arloa Layman RAMAN, MD  Sent: 07/07/2024   6:45 PM EDT  To: [facility] Scheduling And Referrals [facility]  Subject: Hospital Follow-up                               Resident follow up:     Please schedule patient for follow as follows:    Resident Clinic  Template: return neurology  Slot length: 30 minute slot (use return slot or urgent follow-up slot)  Provider: Dayna   Diagnosis: IIH  Time range:  between 2-4 months. Please do not schedule sooner.    Preferred day of week: Any  Please ensure scheduled PRIOR to appointment: N/A  If none of these dates are available, please reply to message sender for further instructions.    Please reply to message sender with an epic inbox message to confirm the appointment has been scheduled.

## 2024-07-09 MED FILL — TIZANIDINE 2 MG TABLET: ORAL | 30 days supply | Qty: 60 | Fill #2

## 2024-07-22 NOTE — Unmapped (Unsigned)
 Internal Medicine Clinic Visit    Reason for visit: ***    A/P:    ***  Assessment & Plan          Health Maintenance:   Health Maintenance   Topic Date Due    Pneumococcal Vaccine 0-49 (2 of 2 - PCV) 03/30/2020    COVID-19 Vaccine (2 - Pfizer risk series) 11/18/2020    Influenza Vaccine (1) 06/10/2024    HPV Cotest with Pap Smear (21-65)  11/20/2028    Pap Smear with Cotest HPV (21-65)  11/28/2028    DTaP/Tdap/Td Vaccines (2 - Td or Tdap) 02/04/2029    Hepatitis C Screen  Completed         No follow-ups on file.  Next Visit:   ***  No orders of the defined types were placed in this encounter.     __________________________________________________________    HPI:  Pt has pseudotumor cerebri, headaches, and HIV.   History of Present Illness        PTHomeBP   Lab Results   Component Value Date    ACD4 1,118 10/22/2018    CD4 54 10/22/2018    HIVCP <20 (H) 05/22/2024    HIVRS Detected (A) 05/22/2024       __________________________________________________________    Problem List:  Problem List[1]    Medications:  Reviewed in EPIC    _________________________________________________________    Physical Exam:   Vital Signs:  There were no vitals filed for this visit.  Wt Readings from Last 3 Encounters:   05/22/24 68 kg (150 lb)   05/16/24 63.5 kg (140 lb)   12/13/23 66 kg (145 lb 8.1 oz)     Physical Exam        Gen: Well appearing, NAD  CV: RRR, no murmurs  Pulm: CTA bilaterally, no crackles or wheezes  Abd: Soft, NTND, normal BS. No HSM.  Ext: No edema  MSK:***  Psych:***  ***  Records review***  Results      Last   Lab Results   Component Value Date    CREATININE 0.91 05/22/2024    CHOL 135 11/21/2023    HDL 45 11/21/2023    LDL 76 11/21/2023    NONHDL 90 11/21/2023    TRIG 66 11/21/2023    A1C 5.4 11/21/2023    PAP Negative for intraephithelial lesion or malignancy 03/29/2017        The ASCVD Risk score (Arnett DK, et al., 2019) failed to calculate.   Medication adherence and barriers to the treatment plan have been addressed. Opportunities to optimize healthy behaviors have been discussed. Patient / caregiver voiced understanding.    {TIP - [facility]- RAFF Pilot- Clinical Documentation Specialist Recommendations-  No specialty comments available.   This text will self delete upon signing note:[id number]}     I personally spent *** minutes face-to-face and non-face-to-face in the care of this patient, which includes all pre, intra, and post visit time on the date of service.         [1]   Patient Active Problem List  Diagnosis    Pseudotumor cerebri    HIV    History of diet controlled gestational diabetes mellitus (GDM)    Nausea    Class 1 obesity without serious comorbidity with body mass index (BMI) of 33.0 to 33.9 in adult    Gastroesophageal reflux disease    Adult general medical examination    Stress at home

## 2024-09-17 NOTE — Progress Notes (Signed)
 INFECTIOUS DISEASES CLINIC  [address]  [place], [place]  [address]  P [phone]  F [phone]     Primary care provider: Myrna Velia Canales, MD    The patient reports they are physically located in   and is currently: not at home. I conducted a audio/video visit. I spent  15m 36s on the video call with the patient. I spent an additional 16 minutes on pre- and post-visit activities on the date of service .     Assessment/Plan:      Morgan Hebert, a 38 y.o. female seen today for routine HIV followup.    Plan:  HIV (dx'd 2015 when presented to the ED with adenopathy. Initial CD4 644 (32%), HIV RNA 11,715)  Overall stable. Current regimen: Biktarvy  (BIC/FTC/TAF)  Misses doses of ARVs rarely  Med access via Medicaid  CD4 count over 300 for >2Y on suppressive ART; no prophylaxis needed; recheck CD4 annually  Discussed ARV adherence and U=U (tx as prevention)    Lab Results   Component Value Date    ACD4 1,118 10/22/2018    CD4TABS 1,198 11/21/2023    CD4 54 10/22/2018    CD4TCELL 52.1 11/21/2023    [id number] 63.4 06/12/2020    HIVRS Detected (A) 05/22/2024    HIVCP <20 (H) 05/22/2024   11/21/23 [facility] Labs: CMP, CBC w/diff, CD4 (1198 (52%) , VL, fasting glucose, HgA1C, fasting lipids     HIV RNA and safety labs (brief return panel) - future order.  Continue current therapy  Encouraged continued excellent ARV adherence    Chest pain 05/2024  Seems most likely GERD-related - has abated since she stopped drinking extra espresso    Pseudotumor cerebri, chronic headaches  [facility], upcoming appt with Dr. Dayna  MRI brain and MRV head completed 07/05/22, symptoms are stable. Research MRA/MRV performed 12/2023 - results reviewed.  We have previously discussed medication use and possible role of marijuana in exacerbating her headaches and nausea (the latter is under better control lately). Stress exacerbate these.   Needs to schedule neuro-ophtho followup.    Immunization and vaccination needs  COVID-19  Discussed current public health guidance and FDA approvals regarding the updated Pfizer-BioNTech, Moderna, or Novavax COVID-19 vaccines, which are intended to reduce the risk of severe illness, hospitalization, and long COVID.   While CDC recommendations for the 2025-2026 season are still pending, vaccination is encouraged for individuals 65+ and those with underlying conditions that increase risk.  This patient has the following medical need(s) for immunization against COVID for the 2025-2026 season: immune compromising condition(s) (including HIV, being on chemotherapy for cancer, being on long-term steroids, and primary immunodeficiency)  She received one dose in 10/2020. I have reiterated my strong recommendation that she access this.  Reminded to get seasonal flu shot, PCV20    Mental Health  Fatigue  Symptoms are stable, exacerbated by stress. Feels better than in prior years kids are doing well and work is going well.   Overall doing well and feels safe. Still strained sometimes with her husband Oneil, but mostly over money and they are generally getting along. They are not sexually active with each other.   Gets 8h of sleep a night but feels tired during the day. Does not think she snores. Discussed sleep hygiene. She may discuss with neurology at upcoming appt.     Sexual health & secondary prevention  Sex with men. Monogamous with single partner. She does disclose status. Never uses condoms,  we have discussed U=U. Has BTL.    Lab Results   Component Value Date    RPR Non Reactive 11/21/2023    LABRPR Nonreactive 06/12/2020    CTNAA Negative 12/15/2022    CTNAA Negative 05/24/2022    CTNAA Negative 05/24/2022    GCNAA Negative 12/15/2022    GCNAA Negative 05/24/2022    GCNAA Negative 05/24/2022    SPECTYPE Urine 10/28/2020    SPECTYPE Swab 10/28/2020    SPECTYPE Swab 10/28/2020    SPECSOURCE Urine 10/28/2020    SPECSOURCE Throat 10/28/2020    SPECSOURCE Rectum 10/28/2020     GC/CT NAATs -- Negative 12/2022   RPR -- NR 11/2023 - repeat 1Y      Health maintenance  Lab Results   Component Value Date    CREATININE 0.91 05/22/2024    QFTTBGOLD NEGATIVE 02/13/2014    HEPCAB Nonreactive 10/28/2020    CHOL 135 11/21/2023    HDL 45 11/21/2023    LDL 76 11/21/2023    NONHDL 90 11/21/2023    TRIG 66 11/21/2023    A1C 5.4 11/21/2023    PAP Negative for intraephithelial lesion or malignancy 03/29/2017    FINALDX  03/29/2019     A: Placenta, delivery    - Preterm (36 weeks 1 days EGA) singleton third trimester placenta, weight 320 g (weight 10th percentile for EGA)   - No acute chorioamnionitis identified   - Three vessel umbilical cord with narrow diameter (0.9 cm) and a furcate insertion  - Placental plate: accelerated maturation of chorionic villi,  increased intervillous and subchorionic fibrin, focal fetal thrombotic vasculopathy, small placental infarction (1.2 cm)          This electronic signature is attestation that the pathologist personally reviewed the submitted material(s) and the final diagnosis reflects that evaluation.       Health maintenance  - chronic, stable  Oral health  She does  have a dentist. Last dental exam within past year.    Eye health  She does  use corrective lenses. Last eye exam >1 year. She saw a local optometrist but has not followed up with ophthalmology. Reminded to do this 09/17/24    Metabolic conditions  Wt Readings from Last 5 Encounters:   05/22/24 68 kg (150 lb)   05/16/24 63.5 kg (140 lb)   12/13/23 66 kg (145 lb 8.1 oz)   06/28/23 67.6 kg (149 lb)   02/22/23 73.9 kg (163 lb)     Lab Results   Component Value Date    CREATININE 0.91 05/22/2024    PROTEINUA Negative 02/19/2020    PROTEINUR <4.0 03/29/2019    GLUCOSEU Negative 02/19/2020    PCRATIOUR  03/29/2019      Comment:      Unable to calculate due to value below lower limit of assay linearity.    GLU 82 05/22/2024    A1C 5.4 11/21/2023    ALT 15 05/22/2024    ALT 9 (L) 05/16/2024    ALT 9 11/21/2023     # Kidney health - urine studies needed but deferred to future visit  # Bone health - assessment not yet needed (under age 50)  # Diabetes assessment - Recent A1c WNL (11/2023)  # NAFLD assessment - monitor over time    Communicable diseases  Lab Results   Component Value Date    QFTTBGOLD NEGATIVE 02/13/2014    HEPAIGG Nonreactive 02/13/2014    HEPBSAB Reactive 02/13/2014    HEPCAB Nonreactive 10/28/2020  MUMPSIGG Equivocal (A) 05/22/2024    RUBIG Positive 05/22/2024    VZVIGG Positive 05/30/2018     # TB screening - no longer needed; negative IGRA, low risk  # Hepatitis screening - repeat HCV screen periodically  # MMR screening - Rubella nonimmune 2019 - got MMR x 1 2020, mumps equivocal 8/25, revaccinate future appt     Cancer screening  Lab Results   Component Value Date    PAP Negative for intraephithelial lesion or malignancy 03/29/2017    FINALDX  03/29/2019     A: Placenta, delivery    - Preterm (36 weeks 1 days EGA) singleton third trimester placenta, weight 320 g (weight 10th percentile for EGA)   - No acute chorioamnionitis identified   - Three vessel umbilical cord with narrow diameter (0.9 cm) and a furcate insertion  - Placental plate: accelerated maturation of chorionic villi,  increased intervillous and subchorionic fibrin, focal fetal thrombotic vasculopathy, small placental infarction (1.2 cm)          This electronic signature is attestation that the pathologist personally reviewed the submitted material(s) and the final diagnosis reflects that evaluation.       # Cervical - repeat Pap 12 months from prior - performed 11/2023 (insufficient cellularity but negative HPV)  # Breast - no indication for screening at present    # Anorectal - not yet done  # Colorectal - screening not indicated  # Liver - no screening indicated  # Lung - screening not indicated    Cardiovascular disease  Lab Results   Component Value Date    CHOL 135 11/21/2023    HDL 45 11/21/2023    LDL 76 11/21/2023    NONHDL 90 11/21/2023 TRIG 66 97/88/7974     # The ASCVD Risk score (Arnett DK, et al., 2019) failed to calculate.  - is not taking aspirin   - is not taking statin  - BP control excellent  - former smoker  # AAA screening - no indication for screening    Immunization History   Administered Date(s) Administered    COVID-19 VACC,MRNA,(PFIZER)(PF) 10/28/2020    Influenza Vaccine Quad(IM)6 MO-Adult(PF) 06/30/2015, 06/15/2016, 07/04/2018, 10/28/2020, 08/18/2021    MMR 03/31/2019    PNEUMOCOCCAL POLYSACCHARIDE 23-VALENT 03/31/2019    TdaP 02/05/2019     Immunizations today - Discussed need for bivalent COVID booster and patient declines recommended immunization(s). Advised to get flu, PCV20. Needs MMR booster.    I personally spent 32 minutes face-to-face and non-face-to-face in the care of this patient, which includes all pre, intra, and post visit time on the date of service.  All documented time was specific to the E/M visit and does not include any procedures that may have been performed.    Disposition  Return to clinic 5-6 months or sooner if needed.    Estefana Gum, MD, MPH   [facility] at [facility]  [address]   [place], [place] [address]  Phone: [phone]   Fax: [phone]     Subjective:      Chief Complaint   HIV followup    HPI  Return patient visit for Morgan Hebert, a 38 y.o. woman with well-controlled HIV, pseudotumor cerebri with associated headaches.  No recent sinus issues. Had a few episodes of chest pain in 8/202 (see notes from ED, IM SDC) attributed to espresso drinks - no recurrence since she cut down on those.  Living separately from husband Oneil. Separated but amicably.  Youngest daughter Melita, born 03/2019) is doing great and loves school. Older daughter Susann) is as well, has custody of niece Marjie) who is now doing well (older sister of niece who died, mom released from jail).   No more cigarettes, only marijuana (smokes regularly/daily)  Only one COVID vaccine so far.    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; reduced carrier risk [x]  Hemoglobinopathy screening - normal adult hemoglobin  WGS study eligible: no  GENETIC COUNSELING PREVISIT SUMMARY Referring provider: [facility]  Indication: Aneuploidy screening Oth    Environmental allergies     Gastroesophageal reflux disease 06/07/2022    HIV (human immunodeficiency virus infection)    (CMS-HCC)     HIV (human immunodeficiency virus infection)    (CMS-HCC)     IIH (idiopathic intracranial hypertension)     IUD (intrauterine device) in place     Lymphadenopathy 02/11/2014    Migraine     daily; uses marijuana and tylenol  for management; pseudotumor cerebri    Obesity     Pseudotumor cerebri     Rubella non-immune status, antepartum (HHS-HCC) 01/14/2019    [ ]  PP MMR    STD (sexually transmitted disease)     Supervision of high risk pregnancy, unspecified, unspecified trimester (HHS-HCC) 05/30/2018    Dating: 9/21 (Unsure of LMP as had a D&C for a missed AB in 06/2018 and discovered she was pregnant in 07/2018)                                    Prenatal Screening:  [x]  Prenatal labs reviewed [x]  SMA 2 copies/Hgb electropheresis wnl/CF [ ]   [x]  1 hr GTT= 250 but done 3 days after BMZ (4/22 and 4/23) --> Repeat 1 hr GTT  5-18= 229  [ ]  28 wk CBC/RPR [ ]  GBS   Fetal Screening:  [x]  aneuploidy scre    Threatened premature labor in third trimester (HHS-HCC) 01/31/2019    Received BMZ 4/22 ([place]4/23 [facility]) for concern for PTL. SVE cl/th/hi.    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 See bottom of this visit's encounter summary for details.  Current Outpatient Medications on File Prior to Visit   Medication Sig    bictegrav-emtricit-tenofov ala (BIKTARVY ) 50-200-25 mg tablet Take 1 tablet by mouth daily.    topiramate  (TOPAMAX ) 50 MG tablet Take 2 tablets (100 mg total) by mouth nightly.     No current facility-administered medications on file prior to visit.     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     Social History  General - lives in [place] with her husband and her 2 daughters (born 2008, 2020). Also has custody of niece (2007).   Mertha and his girlfriend are there  sometimes as well.   2021 - Family trauma, her 12yo niece committed suicide and her aunt passed away from cancer.   Mom and sister are both positive (sister is perinatally infected, patient with presumed sexual transmission from female partner).  Works at home for [employer], dreams of becoming a clinical research associate or a therapist, music. Loves vampire shows like True Blood.   Sexual History - sex with men (husband only - not since around 2024)  Substance Use - marijuana (smokes nightly), smoked maybe 3 tobacco cigarettes a day but quit in 2022.  Social History     Tobacco Use    Smoking status: Former     Current packs/day: 0.50     Average packs/day: 0.5 packs/day for 5.0 years (2.5 ttl pk-yrs)     Types: Cigarettes    Smokeless tobacco: Never   Substance Use Topics    Alcohol use: Yes     Alcohol/week: 4.0 standard drinks of alcohol     Types: 1 Glasses of wine, 1 Shots of liquor, 2 Standard drinks or equivalent per week     Comment: rare     Review of Systems  As per HPI. Remainder of 10 systems reviewed, negative.      Objective:      There were no vitals taken for this visit.  Wt Readings from Last 3 Encounters:   05/22/24 68 kg (150 lb)   05/16/24 63.5 kg (140 lb)   12/13/23 66 kg (145 lb 8.1 oz)     Video visit. Last exam below.   Today 09/17/24 nontoxic, animated and in a good mood.    Const looks well and attentive, alert, appropriate   Eyes sclerae anicteric, noninjected OU   ENT no thrush, leukoplakia or oral lesions   Lymph no cervical or supraclavicular LAD   CV RRR. No murmurs. No rub or gallop. S1/S2.   Lungs CTAB ant/post, normal work of breathing   GI Soft, no organomegaly. NTND. NABS.   GU deferred   Rectal deferred   Skin no petechiae, ecchymoses or rashes on full inspection.    MSK no joint tenderness and normal ROM throughout. No LE edema, feet warm with brisk DP/PT pulses.   Neuro CN II-XII grossly intact, MAEE, non focal   Psych Appropriate affect. Eye contact good. Linear thoughts. Fluent speech.     Laboratory Data  Reviewed in Epic today, using Synopsis and Chart Review filters.    Lab Results   Component Value Date    CREATININE 0.91 05/22/2024    QFTTBGOLD NEGATIVE 02/13/2014    HEPCAB Nonreactive 10/28/2020    CHOL 135 11/21/2023    HDL 45 11/21/2023    LDL 76 11/21/2023    NONHDL 90 11/21/2023    TRIG 66 11/21/2023    A1C 5.4 11/21/2023    PAP Negative for intraephithelial lesion or malignancy 03/29/2017    FINALDX  03/29/2019     A: Placenta, delivery    - Preterm (36 weeks 1 days EGA) singleton third trimester placenta, weight 320 g (weight 10th percentile for EGA)   - No acute chorioamnionitis identified   - Three vessel umbilical cord with narrow diameter (0.9 cm) and a furcate insertion  - Placental plate: accelerated maturation of chorionic villi,  increased intervillous and subchorionic fibrin, focal fetal thrombotic vasculopathy, small placental infarction (1.2 cm)          This electronic signature is attestation that the pathologist personally reviewed the submitted material(s) and the final diagnosis  reflects that evaluation.         Radiology  Research MRI/MRA Interpretation  12/26/23  GALILEA QUITO ([birth year], 38 y.o.)     This individual is enrolled in the MACS/WIHS Combined Cohort Study. As part of this study, the individual enrolled in a brain imaging protocol to assess cerebrovascular injury and immune activation. The participant underwent a research MRI/MRA that included measures of vasoreactivity, large vessel disease, and small vessel injury on 12/13/2023. Imaging was conducted at the [facility] at the [place] [facility] [place]  at [facility] on 3T MR scanners using a standard multi-channel phased array head coil.     A member of the research team who is a neuroradiologist has reviewed the research images and identified the following findings: .     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     -Tiny enhancing lesions in the right Meckel's cave. 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      Because this is a research assessment, we recommend that any findings that may be potentially clinically significant be further evaluated in the clinical setting. Please note that the research images are not stored on the [facility] medical record system.     The recommendation from our research team is  to consider an ENT referral to evaluate chronic sinusitis and a repeat MRI Wo/W contrast with a dedicated skull base protocol to further evaluate the lesions in the right Meckel's cave.     The participant was contacted on 12/26/2023 and results reviewed. Additionally, we have notified her PCP and neurologist.        _____________________________________________________________________

## 2024-09-17 NOTE — Patient Instructions (Addendum)
 It was great to see you today.    Remember to get your seasonal flu shot!  The other shot you needs is a pneumonia shot, also called PCV20 or Capvaxine.    You do need to make an appointment with the neuro-ophthalmology clinic - [phone]    The ID clinic phone number is [phone].  The ID clinic fax number is [phone].    Please note that your laboratory and other results may be visible to you in real time, possibly before they reach your provider. Please allow 48 hours for clinical interpretation of these results. Importantly, even if a result is flagged as abnormal, it may not be one that impacts your health.    For urgent issues on nights and weekends you may reach the ID Physician on call through the [facility] Operator at [phone].     URGENT CARE  Please call ahead to speak with the nursing staff if you are in need of an urgent appointment.       MEDICATIONS  For refills please contact your pharmacy and ask them to electronically send or fax the request to the clinic.   Please bring all medications in original bottles to every appointment.    HMAP (formerly ADAP) or [employer] Eligibility (required even if you do not receive medication through [facility])  Please remember to renew your [employer] eligibility during renewal periods which occur twice a year: January-March and July-September.     The following are needed for each renewal:   - [place]  Identification (if you don't have one, then a bill with your name and address in [place] )   - proof of income (award letter, W-2, or last three check stubs)   If you are unable to come in for renewal, let us  know if we can mail, fax or e-mail paperwork to you.   HMAP Contact: [phone].     Estefana FORBES Gum, MD  [facility] Infectious Diseases Clinic at [facility]  [address]   [place], [place] [address]  Phone: [phone]   Fax: [phone]     Lab info:  Your most recent CD4 T-cell counts and viral loads are below. Here are a few things to keep in mind when looking at your numbers:  Our goal is to get your virus to be undetectable and keep it undetectable. If the virus is undetectable you are much more likely to stay healthy.   We consider your viral load to be undetectable if it says <20 or if it says Not detected.   For most people, we're checking CD4 counts every other visit (once or twice a year, or sometimes even less).  It's normal for your CD4 count to be different from visit to visit.   You can help by taking your medications at about the same time, every single day. If you're having trouble with taking your medications, it's important to let us  know.    Lab Results   Component Value Date    ACD4 1,118 10/22/2018    CD4 54 10/22/2018    HIVCP <20 (H) 05/22/2024    HIVRS Detected (A) 05/22/2024

## 2024-09-18 DIAGNOSIS — Z5181 Encounter for therapeutic drug level monitoring: Principal | ICD-10-CM

## 2024-09-18 DIAGNOSIS — B20 Human immunodeficiency virus [HIV] disease: Principal | ICD-10-CM

## 2024-09-18 DIAGNOSIS — Z79899 Other long term (current) drug therapy: Principal | ICD-10-CM

## 2024-09-18 DIAGNOSIS — Z9189 Other specified personal risk factors, not elsewhere classified: Principal | ICD-10-CM

## 2024-09-18 MED ORDER — BIKTARVY 50 MG-200 MG-25 MG TABLET
ORAL_TABLET | Freq: Every day | ORAL | 3 refills | 90.00000 days | Status: CP
Start: 2024-09-18 — End: 2025-09-18
  Filled 2024-10-15: qty 90, 90d supply, fill #0

## 2024-10-11 MED ORDER — TOPIRAMATE 50 MG TABLET
ORAL_TABLET | Freq: Every evening | ORAL | 0 refills | 90.00000 days | Status: CP
Start: 2024-10-11 — End: 2025-01-09
  Filled 2024-10-15: qty 180, 90d supply, fill #0

## 2024-10-11 NOTE — Progress Notes (Signed)
 [facility] Refill Coordination Note    Specialty Medication(s) to be Shipped:   Infectious Disease: BIKTARVY  50-200-25 mg tablet (bictegrav-emtricit-tenofov ala)    Other medication(s) to be shipped: tizanidine  2 MG tablet (ZANAFLEX ) topiramate  50 MG tablet (TOPAMAX )    Specialty Medications not needed at this time: N/A     Morgan Hebert, DOB: 08-20-1986  Phone: [phone] (home) [phone] (work)      All above HIPAA information was verified with patient.     Was a nurse, learning disability used for this call? No    Completed refill call assessment today to schedule patient's medication shipment from the [facility]  [phone]).  All relevant notes have been reviewed.     Specialty medication(s) and dose(s) confirmed: Regimen is correct and unchanged.   Changes to medications: Morgan Hebert reports no changes at this time.  Changes to insurance: No  New side effects reported not previously addressed with a pharmacist or physician: None reported  Questions for the pharmacist: No    Confirmed patient received a Conservation Officer, Historic Buildings and a Surveyor, Mining with first shipment. The patient will receive a drug information handout for each medication shipped and additional FDA Medication Guides as required.       DISEASE/MEDICATION-SPECIFIC INFORMATION        N/A    SPECIALTY MEDICATION ADHERENCE     Medication Adherence    Patient reported X missed doses in the last month: 0  Specialty Medication: BIKTARVY  50-200-25 mg tablet (bictegrav-emtricit-tenofov ala)  Patient is on additional specialty medications: No  Informant: patient              Were doses missed due to medication being on hold? No    BIKTARVY  50-200-25 mg tablet (bictegrav-emtricit-tenofov ala) : 7 days of medicine on hand       Specialty medication is an injection or given on a cycle: No    REFERRAL TO PHARMACIST     Referral to the pharmacist: Not needed      [facility]     Shipping address confirmed in Epic. Cost and Payment: Patient has a copay of $8. They are aware and have authorized the pharmacy to charge the credit card on file.    Delivery Scheduled: Yes, Expected medication delivery date: 1/7.     Medication will be delivered via Next Day Courier to the prescription address in Epic WAM.    [facility]

## 2024-10-11 NOTE — Telephone Encounter (Signed)
 Refill request received from patient.      Medication Requested: Topiramate  50 mg  Last Office Visit: 02/21/2023  Next Office Visit: 10/16/2024  Last Prescriber: Dr. Arloa     Nurse refill requirements met? No  If not met, why: Allergy/Contraindication warning  Patient not seen in last 12 months     Sent to: Provider for signing  If sent to provider, which provider?: Dr. Dayna

## 2024-10-14 NOTE — Progress Notes (Unsigned)
 Neurology Follow-up Visit Note     [facility]  [address]  [address]  [place] [address]    Date: 10/16/2024   Patient Name: Morgan Hebert   MRN: [id number]   PCP: Myrna Velia Canales  Referring Provider: Myrna Velia Canales, MD       Assessment and Plan          Ms. Tercero is a 39 y.o. female presenting for follow-up of multifactorial headache.     # Headache  Patient carries diagnoses of IIH and migraine headache and has features consistent with chronic tension type headache, as well. OTC medication overuse and marijuana use also potentially contributory. She has intermittently responded to Diamox  in the past but recently discontinued due to poor efficacy. She denies vision loss or changes but is due for a formal ophthalmology evaluation. Topamax  was started to target both IIH and migraine and patient is tolerating this well but continues to have daily tension type headaches, for which Tizanidine  was added in 08/2022 and increased to 4mg  on 02/2023. If this is not effective for chronic daily headache, will consider use of CGRP inhibitor. TCA's contraindicated due to interactions with Biktarvy ; BB contraindicated due to low BP.   - CMP today and EKG in September  - advised formal ophtho exam    Return Visit in: 6 months    I personally spent 45 minutes face-to-face and non-face-to-face in the care of this patient, which includes all pre, intra, and post visit time on the date of service.      Patient was discussed with Dr.Traub who agrees with assessment and plan.    Penne Boone, DO  Resident Physician, PGY-2  Department of Neurology  [facility], [place], [place]         HPI         HPI: [name] is a 39 y.o. female who presents for follow up to the [facility] for follow-up of headache. Patient was last seen by [facility] on 08/11/22.      To review their relevant history:    IIH was diagnosed in 2019 after 1 year of HA that progressed to include visual changes. Opening pressures on LP have been as high as 40. She has not required LP since 2020 and was initially maintained on Diamox  only however she continued to have some headaches despite Diamox  dosing as high as 1500 mg BID (this dose was increased from 1000 BID in the setting of pregnancy in 2020). She presented to neurology in November 2022 with worsened HA and 1 month of persistent AM N/V in the setting of being off Diamox  therapy for 2 months. Diamox  was restarted at 1g BID. She saw neuro-ophthalmology shortly after neurology in 08/2021, who noted a stable eye exam. As of 04/28/22, patient reported no reduction in HA since restarting her Diamox . She continued to have daily headaches and morning nausea. Given headaches did not respond at all to restarting Diamox , we considered other headache types including migraine (photophobia is also sometimes present without vision blurring), tension type HA, and medication overuse HA. We started Topamax , to target IIH and migraine. This has not reduced daily HA however she has had reduction in daily nausea. She is not requiring Imitrex . Given persistent HA that has been nonresponsive to multiple medications, we checked MRI brain and MRV head to rule out vascular causes of HA. There was  no evidence of CVST or other intracranial abnormality that could explain her headaches. She does have some mild L > R frontal T2 abnormalities that can likely be attributed to her migraine history (vs small vessel ischemic disease although this is less likely given age). Started Tizanidine  in November 2023.    Interval History 02/22/23:  - Still having daily headaches but less severe with 2 mg nighttime Tizanidine   - No AM grogginess with Tizanidine    - Using Compazine  1-2x per week  - No changes to vision  - Losing weight    Interval History 10/17/23:  - Topomax, compazine , tizanidine   - CGRP        Allergies   Allergen Reactions    Peanut Other (See Comments)     Patient allergic to walnuts, cashews, pistachios and peanuts in excess.        Current Outpatient Medications   Medication Sig Dispense Refill    bictegrav-emtricit-tenofov ala (BIKTARVY ) 50-200-25 mg tablet Take 1 tablet by mouth daily. 90 tablet 3    tizanidine  (ZANAFLEX ) 2 MG tablet Take 2 tablets (4 mg total) by mouth nightly. 60 tablet 5    topiramate  (TOPAMAX ) 50 MG tablet Take 2 tablets (100 mg total) by mouth nightly. 180 tablet 0     No current facility-administered medications for this visit.       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  Indication: Aneuploidy screening Oth    Environmental allergies     Gastroesophageal reflux disease 06/07/2022    HIV (human immunodeficiency virus infection)    (CMS-HCC)     HIV (human immunodeficiency virus infection)    (CMS-HCC)     IIH (idiopathic intracranial hypertension)     IUD (intrauterine device) in place     Lymphadenopathy 02/11/2014    Migraine     daily; uses marijuana and tylenol  for management; pseudotumor cerebri    Obesity     Pseudotumor cerebri     Rubella non-immune status, antepartum (HHS-HCC) 01/14/2019    [ ]  PP MMR    STD (sexually transmitted disease)     Supervision of high risk pregnancy, unspecified, unspecified trimester (HHS-HCC) 05/30/2018    Dating: 9/21 (Unsure of LMP as had a D&C for a missed AB in 06/2018 and discovered she was pregnant in 07/2018) Prenatal Screening:  [x]  Prenatal labs reviewed [x]  SMA 2 copies/Hgb electropheresis wnl/CF [ ]   [x]  1 hr GTT= 250 but done 3 days after BMZ (4/22 and 4/23) --> Repeat 1 hr GTT  5-18= 229  [ ]  28 wk CBC/RPR [ ]  GBS   Fetal Screening:  [x]  aneuploidy scre    Threatened premature labor in third trimester (HHS-HCC) 01/31/2019    Received BMZ 4/22 ([place])-4/23 [facility]) for concern for PTL. SVE cl/th/hi.    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, symptomatic sometimes    Tooth sensitivity     ULQ to sugar  Past Surgical History:   Procedure Laterality Date    DILATION AND CURETTAGE OF UTERUS      LUMBAR PUNCTURE      PR CESAREAN DELIVERY ONLY N/A 03/29/2019    Procedure: CESAREAN DELIVERY ONLY;  Surgeon: Arlean Almarie Devora Roger, MD;  Location: L&D C-SECTION OR SUITES [facility];  Service: Maternal-Fetal Medicine    PR COLONOSCOPY W/BIOPSY SINGLE/MULTIPLE  07/03/2014    Procedure: COLONOSCOPY, FLEXIBLE, PROXIMAL TO SPLENIC FLEXURE; WITH BIOPSY, SINGLE OR MULTIPLE;  Surgeon: Thedora JONETTA Plain, MD;  Location: GI PROCEDURES [facility];  Service: Gastroenterology    PR DILATION/CURETTAGE,DIAGNOSTIC N/A 06/14/2018    Procedure: DILATION AND CURETTAGE, DIAGNOSTIC AND/OR THERAPEUTIC (NON OBSTETRICAL);  Surgeon: Hadassah Lela Buba, MD;  Location: [facility] OR [facility];  Service: [facility] Primary Gynecology    PR LAP,RMV  ADNEXAL STRUCTURE Bilateral 06/04/2019    Procedure: R21  LAPAROSCOPY, SURGICAL; W/REMOVAL OF ADNEXAL STRUCTURES (PARTIAL OR TOTAL OOPHORECTOMY &/OR SALPINGECTOMY);  Surgeon: Greig Ronnald Public, MD;  Location: [facility] OR [facility];  Service: Family Planning    PR SIGMOIDOSCOPY FLX DX W/COLLJ SPEC BR/WA IF PFRMD N/A 06/25/2020    Procedure: SIGMOIDOSCOPY, FLEXIBLE; DIAGNOSTIC, WITH OR WITHOUT COLLECTION OF SPECIMEN(S) BY BRUSHING OR WASHING;  Surgeon: Arlean Obadiah Spitz, MD;  Location: [facility] GI PROCEDURES [facility];  Service: Gastroenterology    PR UPPER GI ENDOSCOPY,BIOPSY N/A 07/03/2014    Procedure: UGI ENDOSCOPY; WITH BIOPSY, SINGLE OR MULTIPLE;  Surgeon: Thedora JONETTA Plain, MD;  Location: [facility] PROCEDURES [facility];  Service: Gastroenterology    WISDOM TOOTH EXTRACTION         Social History     Socioeconomic History    Marital status: Married     Spouse name: Mark     Number of children: 2   Tobacco Use    Smoking status: Former     Current packs/day: 0.50     Average packs/day: 0.5 packs/day for 5.0 years (2.5 ttl pk-yrs)     Types: Cigarettes    Smokeless tobacco: Never   Vaping Use    Vaping status: Never Used   Substance and Sexual Activity    Alcohol use: Yes     Alcohol/week: 4.0 standard drinks of alcohol     Types: 1 Glasses of wine, 1 Shots of liquor, 2 Standard drinks or equivalent per week     Comment: rare    Drug use: Yes     Frequency: 35.0 times per week     Types: Marijuana     Comment: depends on the week for migraines    Sexual activity: Yes     Partners: Male   Social History Narrative    Patient lives in [place], [place] with her Husband and two daughters..  She is not employed.     Social Drivers of Health     Tobacco Use: Medium Risk (05/22/2024)    Patient History     Smoking Tobacco Use: Former     Smokeless Tobacco Use: Never   Interpersonal Safety: Not At Risk (05/16/2024)    Interpersonal Safety     Unsafe Where You Currently Live: No     Physically Hurt by Anyone: No     Abused by Anyone: No       Family History   Problem Relation Age of Onset    Cervical cancer Mother     Diabetes Mother     Asthma Mother     Hypertension Mother     Diabetes Maternal Aunt  Hypertension Maternal Aunt     Breast cancer Maternal Grandmother     Cancer Maternal Grandmother     No Known Problems Daughter     Glaucoma Neg Hx             Objective        Vital signs: There were no vitals taken for this visit.       Physical Exam:  General Appearance: Well appearing. In no acute distress.  HEENT: Head is atraumatic and normocephalic. Sclera anicteric without injection. Oropharyngeal membranes are moist with no erythema or exudate.  Neck: Supple.  Lungs:  normal WOB on RA  Heart: Radial pulses 2+ bilaterally.  Abdomen: Nondistended.  Extremities: No clubbing, cyanosis, or edema.     Neurological Examination:      Mental Status: Alert, conversant, able to follow conversation and interview. Spontaneous speech was fluent without word finding pauses, dysarthria, or paraphasic errors. Comprehension was intact to simple and multi-step commands. Memory for recent and remote events was intact. Affect was full range and appropriate to context.     Cranial Nerves: Visual fields intact to direct confrontation. PERRL 4 mm. Pursuit eye movements were uninterrupted with full range and without more than end-gaze nystagmus. Facial sensation intact bilaterally to light touch on the forehead, cheek, and chin. Face symmetric at rest. Normal facial movement bilaterally, including forehead, eye closure and grimace/smile. Hearing intact to conversation. Shoulder shrug full strength bilaterally.     Motor Exam: Normal bulk.  Normal tone in the upper and lower extremities.  No tremors, myoclonus, or other adventitious movement.  Pronator drift is absent.  UE R/L: deltoid 5/5, biceps 5/5, triceps 5/5, wrist flexion 5/5, wrist extension 5/5, finger spread 5/5 and hand grip strong/strong.  LE R/L: hip flexion 5/5, hip extension 5/5, quadriceps 5/5, hamstrings 5/5, dorsiflexion 5/5 and plantar flexion 5/5.  Fine finger movements and foot taps had normal velocity and amplitude with no breakdown.        Reflexes:    R L   Biceps +2 +2   Brachioradialis +2 +2   Triceps +2 +2   Patella +2 +2   Achilles +2 +2         Sensory: Sensation normal to light touch and temperature sensation to cold in both hands and both feet and to position sense and vibration distally in the fingers and toes. Romberg sign negative.     Cerebellar/Coordination/Gait: Rapid alternating movements are normal in bilateral upper extremities. Finger-to-nose is normal without ataxia or dysmetria bilaterally. Heel-to-shin is normal without ataxia or dysmetria bilaterally. Gait exam demonstrates normal posture, base, stride length, arm swing and turns.     Diagnostic Studies     Last Chem:   Lab Results   Component Value Date    NA 144 05/22/2024    K 4.2 05/22/2024    BUN 8 (L) 05/22/2024    CREATININE 0.91 05/22/2024    GLU 82 05/22/2024     Last LFTs:   Lab Results   Component Value Date    AST 15 05/22/2024    ALT 15 05/22/2024    ALKPHOS 57 05/16/2024    GGT 14 11/21/2023    BILITOT 0.2 (L) 05/22/2024    PROT 7.7 05/16/2024    ALBUMIN 4.3 05/16/2024

## 2024-10-15 MED FILL — TIZANIDINE 2 MG TABLET: ORAL | 30 days supply | Qty: 60 | Fill #3
